# Patient Record
Sex: Male | Born: 1950
Health system: Southern US, Community
[De-identification: ages and names within clinical notes are randomized; demographics above are authoritative.]

## PROBLEM LIST (undated history)

## (undated) DIAGNOSIS — M179 Osteoarthritis of knee, unspecified: Secondary | ICD-10-CM

## (undated) DIAGNOSIS — E669 Obesity, unspecified: Secondary | ICD-10-CM

## (undated) DIAGNOSIS — E039 Hypothyroidism, unspecified: Secondary | ICD-10-CM

## (undated) DIAGNOSIS — E291 Testicular hypofunction: Secondary | ICD-10-CM

## (undated) DIAGNOSIS — Z9289 Personal history of other medical treatment: Secondary | ICD-10-CM

## (undated) DIAGNOSIS — H409 Unspecified glaucoma: Secondary | ICD-10-CM

## (undated) DIAGNOSIS — E119 Type 2 diabetes mellitus without complications: Secondary | ICD-10-CM

## (undated) DIAGNOSIS — M1990 Unspecified osteoarthritis, unspecified site: Secondary | ICD-10-CM

## (undated) DIAGNOSIS — F519 Sleep disorder not due to a substance or known physiological condition, unspecified: Secondary | ICD-10-CM

## (undated) DIAGNOSIS — M2550 Pain in unspecified joint: Secondary | ICD-10-CM

## (undated) DIAGNOSIS — J309 Allergic rhinitis, unspecified: Secondary | ICD-10-CM

## (undated) DIAGNOSIS — E559 Vitamin D deficiency, unspecified: Secondary | ICD-10-CM

## (undated) DIAGNOSIS — G4733 Obstructive sleep apnea (adult) (pediatric): Secondary | ICD-10-CM

## (undated) DIAGNOSIS — M1710 Unilateral primary osteoarthritis, unspecified knee: Secondary | ICD-10-CM

## (undated) DIAGNOSIS — M25561 Pain in right knee: Secondary | ICD-10-CM

## (undated) DIAGNOSIS — E785 Hyperlipidemia, unspecified: Secondary | ICD-10-CM

## (undated) DIAGNOSIS — M25562 Pain in left knee: Secondary | ICD-10-CM

## (undated) DIAGNOSIS — K219 Gastro-esophageal reflux disease without esophagitis: Secondary | ICD-10-CM

## (undated) DIAGNOSIS — G8929 Other chronic pain: Secondary | ICD-10-CM

## (undated) HISTORY — DX: Type 2 diabetes mellitus without complications: E11.9

## (undated) HISTORY — PX: UVULOPALATOPHARYNGOPLASTY: SHX827

## (undated) HISTORY — DX: Hyperlipidemia, unspecified: E78.5

## (undated) HISTORY — DX: Obesity, unspecified: E66.9

## (undated) HISTORY — DX: Hypothyroidism, unspecified: E03.9

## (undated) HISTORY — DX: Unspecified osteoarthritis, unspecified site: M19.90

## (undated) HISTORY — DX: Personal history of other medical treatment: Z92.89

## (undated) HISTORY — DX: Gastro-esophageal reflux disease without esophagitis: K21.9

## (undated) HISTORY — DX: Vitamin D deficiency, unspecified: E55.9

## (undated) HISTORY — DX: Sleep disorder not due to a substance or known physiological condition, unspecified: F51.9

## (undated) HISTORY — DX: Testicular hypofunction: E29.1

## (undated) HISTORY — DX: Other chronic pain: G89.29

## (undated) HISTORY — PX: TONSILLECTOMY: SUR1361

## (undated) HISTORY — DX: Unspecified glaucoma: H40.9

## (undated) HISTORY — DX: Pain in right knee: M25.561

## (undated) HISTORY — DX: Obstructive sleep apnea (adult) (pediatric): G47.33

## (undated) HISTORY — PX: VASECTOMY: SHX75

## (undated) HISTORY — DX: Allergic rhinitis, unspecified: J30.9

## (undated) HISTORY — PX: NASAL SEPTUM SURGERY: SHX37

## (undated) HISTORY — DX: Pain in left knee: M25.562

## (undated) HISTORY — DX: Pain in unspecified joint: M25.50

---

## 2000-04-05 ENCOUNTER — Ambulatory Visit (HOSPITAL_COMMUNITY): Admission: RE | Admit: 2000-04-05 | Discharge: 2000-04-05 | Payer: Self-pay | Admitting: Orthopedic Surgery

## 2000-04-05 ENCOUNTER — Encounter: Payer: Self-pay | Admitting: Orthopedic Surgery

## 2002-04-29 ENCOUNTER — Ambulatory Visit (HOSPITAL_BASED_OUTPATIENT_CLINIC_OR_DEPARTMENT_OTHER): Admission: RE | Admit: 2002-04-29 | Discharge: 2002-04-29 | Payer: Self-pay | Admitting: General Practice

## 2002-07-13 ENCOUNTER — Encounter: Payer: Self-pay | Admitting: Otolaryngology

## 2002-07-13 ENCOUNTER — Encounter (INDEPENDENT_AMBULATORY_CARE_PROVIDER_SITE_OTHER): Payer: Self-pay | Admitting: Specialist

## 2002-07-13 ENCOUNTER — Ambulatory Visit (HOSPITAL_COMMUNITY): Admission: RE | Admit: 2002-07-13 | Discharge: 2002-07-14 | Payer: Self-pay | Admitting: Otolaryngology

## 2005-03-27 ENCOUNTER — Ambulatory Visit: Payer: Self-pay | Admitting: Internal Medicine

## 2005-07-19 ENCOUNTER — Ambulatory Visit: Payer: Self-pay | Admitting: Internal Medicine

## 2005-08-08 ENCOUNTER — Ambulatory Visit: Payer: Self-pay | Admitting: Internal Medicine

## 2005-09-22 ENCOUNTER — Ambulatory Visit: Payer: Self-pay | Admitting: Family Medicine

## 2006-05-16 ENCOUNTER — Ambulatory Visit: Payer: Self-pay | Admitting: Internal Medicine

## 2006-05-27 ENCOUNTER — Ambulatory Visit: Payer: Self-pay

## 2006-06-05 ENCOUNTER — Ambulatory Visit: Payer: Self-pay | Admitting: Internal Medicine

## 2006-06-14 ENCOUNTER — Ambulatory Visit: Payer: Self-pay | Admitting: Internal Medicine

## 2006-06-18 ENCOUNTER — Ambulatory Visit: Payer: Self-pay | Admitting: Gastroenterology

## 2006-06-19 ENCOUNTER — Ambulatory Visit: Payer: Self-pay | Admitting: Gastroenterology

## 2006-06-19 ENCOUNTER — Encounter (INDEPENDENT_AMBULATORY_CARE_PROVIDER_SITE_OTHER): Payer: Self-pay | Admitting: *Deleted

## 2006-08-01 ENCOUNTER — Ambulatory Visit: Payer: Self-pay | Admitting: Internal Medicine

## 2006-09-27 ENCOUNTER — Ambulatory Visit: Payer: Self-pay | Admitting: Internal Medicine

## 2006-10-24 ENCOUNTER — Ambulatory Visit: Payer: Self-pay | Admitting: Internal Medicine

## 2007-04-01 ENCOUNTER — Ambulatory Visit: Payer: Self-pay | Admitting: Internal Medicine

## 2007-04-06 ENCOUNTER — Encounter: Payer: Self-pay | Admitting: Internal Medicine

## 2007-04-06 DIAGNOSIS — M1990 Unspecified osteoarthritis, unspecified site: Secondary | ICD-10-CM | POA: Insufficient documentation

## 2007-04-06 DIAGNOSIS — E782 Mixed hyperlipidemia: Secondary | ICD-10-CM

## 2007-04-06 DIAGNOSIS — F528 Other sexual dysfunction not due to a substance or known physiological condition: Secondary | ICD-10-CM

## 2007-04-06 DIAGNOSIS — J309 Allergic rhinitis, unspecified: Secondary | ICD-10-CM

## 2007-04-06 DIAGNOSIS — G4733 Obstructive sleep apnea (adult) (pediatric): Secondary | ICD-10-CM

## 2007-04-06 DIAGNOSIS — J342 Deviated nasal septum: Secondary | ICD-10-CM

## 2007-04-06 DIAGNOSIS — E291 Testicular hypofunction: Secondary | ICD-10-CM

## 2007-07-11 ENCOUNTER — Ambulatory Visit: Payer: Self-pay | Admitting: Internal Medicine

## 2007-07-11 DIAGNOSIS — F519 Sleep disorder not due to a substance or known physiological condition, unspecified: Secondary | ICD-10-CM | POA: Insufficient documentation

## 2007-07-11 DIAGNOSIS — K219 Gastro-esophageal reflux disease without esophagitis: Secondary | ICD-10-CM

## 2007-07-11 DIAGNOSIS — J019 Acute sinusitis, unspecified: Secondary | ICD-10-CM

## 2007-07-11 DIAGNOSIS — E739 Lactose intolerance, unspecified: Secondary | ICD-10-CM

## 2008-07-13 ENCOUNTER — Ambulatory Visit: Payer: Self-pay | Admitting: Internal Medicine

## 2010-08-15 NOTE — Assessment & Plan Note (Signed)
Summary: SINUS /NWS $50   Vital Signs:  Patient Profile:   60 Years Old Male Weight:      284 pounds O2 Sat:      94 % O2 treatment:    Room Air Temp:     98.3 degrees F oral Pulse rate:   96 / minute BP sitting:   122 / 84  (left arm) Cuff size:   regular  Vitals Entered By: Payton Spark CMA (July 13, 2008 4:21 PM)                 Chief Complaint:  sinus problems x 5 days.  History of Present Illness: no insurance currently- here with acute onset sinus pain, pressure, fever and greenish d/c for 3 days just not getting better; some mild ST, but no significant cough, sob, wheezing or doe    Updated Prior Medication List: CEPHALEXIN 500 MG CAPS (CEPHALEXIN) 1 by mouth three times a day  Current Allergies (reviewed today): No known allergies   Past Medical History:    Reviewed history from 07/11/2007 and no changes required:       Obstructive Sleep Apnea       Allergic rhinitis       Hx of Deviated Nasal Septum       Hyperlipidemia       Osteoarthritis       Erectile Dysfunction       Hypogonadism       DJD       GERD       glucose intolerance  Past Surgical History:    Reviewed history from 04/06/2007 and no changes required:       Tonsillectomy       Deviated Nasal Septum Repair       Vasectomy   Social History:    Reviewed history from 07/11/2007 and no changes required:       Never Smoked       Alcohol use-no       Married    Review of Systems       all otherwise negative    Physical Exam  General:     alert and overweight-appearing.  , mild ill  Head:     Normocephalic and atraumatic without obvious abnormalities. No apparent alopecia or balding. Eyes:     No corneal or conjunctival inflammation noted. EOMI. Perrla.  Ears:     bilat tm's red, sinus tender bilat Nose:     nasal dischargemucosal pallor and mucosal erythema.   Mouth:     pharyngeal erythema and fair dentition.   Neck:     supple and cervical lymphadenopathy.     Lungs:     Normal respiratory effort, chest expands symmetrically. Lungs are clear to auscultation, no crackles or wheezes. Heart:     Normal rate and regular rhythm. S1 and S2 normal without gallop, murmur, click, rub or other extra sounds. Extremities:     no edema, no ulcers     Impression & Recommendations:  Problem # 1:  SINUSITIS- ACUTE-NOS (ICD-461.9)  The following medications were removed from the medication list:    Avelox 400 Mg Tabs (Moxifloxacin hcl) .Marland Kitchen... 1 by mouth qd    Promethazine-codeine 6.25-10 Mg/30ml Syrp (Promethazine-codeine) .Marland Kitchen... 1 tsp by mouth qid prn  His updated medication list for this problem includes:    Cephalexin 500 Mg Caps (Cephalexin) .Marland Kitchen... 1 by mouth three times a day treat as above, f/u any worsening signs or symptoms   Problem #  2:  GLUCOSE INTOLERANCE (ICD-271.3) asympt- to call for onset polydipsia, polyuria  Problem # 3:  ALLERGIC RHINITIS (ICD-477.9) stable overall by hx and exam, ok to continue meds/tx as is - for otc meds  Complete Medication List: 1)  Cephalexin 500 Mg Caps (Cephalexin) .Marland Kitchen.. 1 by mouth three times a day   Patient Instructions: 1)  Please take all new medications as prescribed 2)  Continue all medications that you may have been taking previously 3)  Please schedule a follow-up appointment as needed.   Prescriptions: CEPHALEXIN 500 MG CAPS (CEPHALEXIN) 1 by mouth three times a day  #30 x 0   Entered and Authorized by:   Corwin Levins MD   Signed by:   Corwin Levins MD on 07/13/2008   Method used:   Print then Give to Patient   RxID:   [id number]  ]

## 2010-10-25 ENCOUNTER — Telehealth: Payer: Self-pay | Admitting: Internal Medicine

## 2010-10-25 NOTE — Telephone Encounter (Signed)
Ok with me 

## 2010-10-25 NOTE — Telephone Encounter (Signed)
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 Pt needs with in 4 wks. Please advise.

## 2010-10-26 ENCOUNTER — Encounter: Payer: Self-pay | Admitting: Internal Medicine

## 2010-10-26 DIAGNOSIS — E119 Type 2 diabetes mellitus without complications: Secondary | ICD-10-CM | POA: Insufficient documentation

## 2010-10-28 ENCOUNTER — Encounter: Payer: Self-pay | Admitting: Internal Medicine

## 2010-10-28 DIAGNOSIS — Z0000 Encounter for general adult medical examination without abnormal findings: Secondary | ICD-10-CM | POA: Insufficient documentation

## 2010-10-31 ENCOUNTER — Encounter: Payer: Self-pay | Admitting: Internal Medicine

## 2010-10-31 ENCOUNTER — Other Ambulatory Visit (INDEPENDENT_AMBULATORY_CARE_PROVIDER_SITE_OTHER): Payer: Self-pay | Admitting: Internal Medicine

## 2010-10-31 ENCOUNTER — Ambulatory Visit (INDEPENDENT_AMBULATORY_CARE_PROVIDER_SITE_OTHER): Payer: Self-pay | Admitting: Internal Medicine

## 2010-10-31 ENCOUNTER — Other Ambulatory Visit (INDEPENDENT_AMBULATORY_CARE_PROVIDER_SITE_OTHER): Payer: Self-pay

## 2010-10-31 VITALS — BP 110/80 | HR 85 | Temp 98.1°F | Ht 75.0 in | Wt 280.0 lb

## 2010-10-31 DIAGNOSIS — R7302 Impaired glucose tolerance (oral): Secondary | ICD-10-CM

## 2010-10-31 DIAGNOSIS — M25511 Pain in right shoulder: Secondary | ICD-10-CM | POA: Insufficient documentation

## 2010-10-31 DIAGNOSIS — Z0000 Encounter for general adult medical examination without abnormal findings: Secondary | ICD-10-CM

## 2010-10-31 DIAGNOSIS — R7309 Other abnormal glucose: Secondary | ICD-10-CM

## 2010-10-31 DIAGNOSIS — E785 Hyperlipidemia, unspecified: Secondary | ICD-10-CM

## 2010-10-31 DIAGNOSIS — F528 Other sexual dysfunction not due to a substance or known physiological condition: Secondary | ICD-10-CM

## 2010-10-31 DIAGNOSIS — M25519 Pain in unspecified shoulder: Secondary | ICD-10-CM

## 2010-10-31 LAB — URINALYSIS, ROUTINE W REFLEX MICROSCOPIC
Bilirubin Urine: NEGATIVE
Leukocytes, UA: NEGATIVE
Nitrite: NEGATIVE
Specific Gravity, Urine: 1.02 (ref 1.000–1.030)
Total Protein, Urine: NEGATIVE
pH: 6 (ref 5.0–8.0)

## 2010-10-31 LAB — BASIC METABOLIC PANEL
BUN: 11 mg/dL (ref 6–23)
Calcium: 9.8 mg/dL (ref 8.4–10.5)
Creatinine, Ser: 1 mg/dL (ref 0.4–1.5)
GFR: 80.54 mL/min (ref >60.00)

## 2010-10-31 LAB — HEPATIC FUNCTION PANEL
Albumin: 4 g/dL (ref 3.5–5.2)
Total Bilirubin: 0.6 mg/dL (ref 0.3–1.2)

## 2010-10-31 LAB — LIPID PANEL
Cholesterol: 232 mg/dL — ABNORMAL HIGH (ref 0–200)
HDL: 50 mg/dL (ref >39.00)
Triglycerides: 208 mg/dL — ABNORMAL HIGH (ref 0.0–149.0)
VLDL: 41.6 mg/dL — ABNORMAL HIGH (ref 0.0–40.0)

## 2010-10-31 LAB — CBC WITH DIFFERENTIAL/PLATELET
Basophils Relative: 0.8 % (ref 0.0–3.0)
Eosinophils Relative: 2.7 % (ref 0.0–5.0)
HCT: 44.5 % (ref 39.0–52.0)
Lymphs Abs: 2.8 10*3/uL (ref 0.7–4.0)
MCV: 91.2 fl (ref 78.0–100.0)
Monocytes Absolute: 0.8 10*3/uL (ref 0.1–1.0)
Monocytes Relative: 9.1 % (ref 3.0–12.0)
RBC: 4.88 Mil/uL (ref 4.22–5.81)
WBC: 8.5 10*3/uL (ref 4.5–10.5)

## 2010-10-31 LAB — LDL CHOLESTEROL, DIRECT: Direct LDL: 153.6 mg/dL

## 2010-10-31 MED ORDER — TETANUS-DIPHTH-ACELL PERTUSSIS 5-2.5-18.5 LF-MCG/0.5 IM SUSP
0.5000 mL | Freq: Once | INTRAMUSCULAR | Status: AC
  Administered 2010-10-31: 0.5 mL via INTRAMUSCULAR

## 2010-10-31 MED ORDER — TADALAFIL 20 MG PO TABS: 20.0000 mg | ORAL_TABLET | Freq: Every day | ORAL | Status: DC | PRN

## 2010-10-31 NOTE — Progress Notes (Signed)
Subjective:    Patient ID: Connor Becker, male    DOB: 07-30-1950, 60 y.o.   MRN: [id number]  HPI  Here for wellness and f/u;  Overall doing ok;  Pt denies CP, worsening SOB, DOE, wheezing, orthopnea, PND, worsening LE edema, palpitations, dizziness or syncope.  Pt denies neurological change such as new Headache, facial or extremity weakness.  Pt denies polydipsia, polyuria, or low sugar symptoms. Pt states overall good compliance with treatment and medications, good tolerability, and trying to follow lower cholesterol diet.  Pt denies worsening depressive symptoms, suicidal ideation or panic. No fever, wt loss, night sweats, loss of appetite, or other constitutional symptoms.  Pt states good ability with ADL's, low fall risk, home safety reviewed and adequate, no significant changes in hearing or vision, and occasionally active with exercise.  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  Also need Cialis refill.  Also with intermittent pain to the right shoulder for 2- 3 yrs, worse with hammering a roof (as he has done lately) and strenuous exercise. Did not have chance to have labs done.   Past Medical History  Diagnosis Date  . Impaired glucose tolerance 10/26/2010  . HYPOGONADISM, MALE 04/06/2007  . HYPERLIPIDEMIA 04/06/2007  . ERECTILE DYSFUNCTION 04/06/2007  . INSOMNIA-SLEEP DISORDER-UNSPEC 07/11/2007  . SLEEP APNEA, OBSTRUCTIVE 04/06/2007  . ALLERGIC RHINITIS 04/06/2007  . GERD 07/11/2007  . OSTEOARTHRITIS 04/06/2007   Past Surgical History  Procedure Date  . Tonsillectomy   . Nasal septum surgery   . Vasectomy     reports that he has never smoked. He does not have any smokeless tobacco history on file. He reports that he does not drink alcohol or use illicit drugs. family history includes Diabetes in an unspecified family member; Heart disease in an unspecified family member; Hypertension in an unspecified family member; and Prostate  cancer in an unspecified family member. No Known Allergies No current outpatient prescriptions on file prior to visit.   Review of Systems Review of Systems  Constitutional: Negative for diaphoresis, activity change, appetite change and unexpected weight change.  HENT: Negative for hearing loss, ear pain, facial swelling, mouth sores and neck stiffness.   Eyes: Negative for pain, redness and visual disturbance.  Respiratory: Negative for shortness of breath and wheezing.   Cardiovascular: Negative for chest pain and palpitations.  Gastrointestinal: Negative for diarrhea, blood in stool, abdominal distention and rectal pain.  Genitourinary: Negative for hematuria, flank pain and decreased urine volume.  Musculoskeletal: Negative for myalgias and joint swelling.  Skin: Negative for color change and wound.  Neurological: Negative for syncope and numbness.  Hematological: Negative for adenopathy.  Psychiatric/Behavioral: Negative for hallucinations, self-injury, decreased concentration and agitation.      Objective:   Physical Exam BP 110/80  Pulse 85  Temp(Src) 98.1 F (36.7 C) (Oral)  Ht 6\' 3"  (1.905 m)  Wt 280 lb (127.007 kg)  BMI 35.00 kg/m2  SpO2 96% Physical Exam  VS noted Constitutional: Pt is oriented to person, place, and time. Appears well-developed and well-nourished.  HENT:  Head: Normocephalic and atraumatic.  Right Ear: External ear normal.  Left Ear: External ear normal.  Nose: Nose normal.  Mouth/Throat: Oropharynx is clear and moist.  Eyes: Conjunctivae and EOM are normal. Pupils are equal, round, and reactive to light.  Neck: Normal range of motion. Neck supple. No JVD present. No tracheal deviation present.  Cardiovascular: Normal rate, regular rhythm, normal heart sounds  and intact distal pulses.   Pulmonary/Chest: Effort normal and breath sounds normal.  Abdominal: Soft. Bowel sounds are normal. There is no tenderness.  Musculoskeletal: Normal range of  motion. Exhibits no edema.  Lymphadenopathy:  Has no cervical adenopathy.  Neurological: Pt is alert and oriented to person, place, and time. Pt has normal reflexes. No cranial nerve deficit.  Skin: Skin is warm and dry. No rash noted.  Psychiatric:  Has  normal mood and affect. Behavior is normal. 1+nervous        Assessment & Plan:

## 2010-10-31 NOTE — Patient Instructions (Signed)
You had the tetanus shot today Your forms will be filled out asap Please go to LAB in the Basement for the blood and/or urine tests to be done today Please call the number on the Blue Card (the [employer] System) for results of testing in 2-3 days You will be contacted regarding the referral for: Dr Rhetta Mura Continue all other medications as before Please return in 1 year for your yearly visit, or sooner if needed, with Lab testing done 3-5 days before

## 2010-10-31 NOTE — Assessment & Plan Note (Signed)
Exam c/w prob bursitis - for referral to Dr Rhetta Mura per pt request

## 2010-10-31 NOTE — Assessment & Plan Note (Signed)

## 2010-10-31 NOTE — Assessment & Plan Note (Signed)
assympt - to check a1c today, cont wt loss,diet efforts

## 2010-10-31 NOTE — Progress Notes (Signed)
Quick Note:  Voice message left on [employer] system - lab is negative, normal or otherwise stable, pt to continue same tx ______ 

## 2010-11-01 ENCOUNTER — Encounter: Payer: Self-pay | Admitting: Internal Medicine

## 2010-11-01 ENCOUNTER — Other Ambulatory Visit: Payer: Self-pay

## 2010-11-01 MED ORDER — ATORVASTATIN CALCIUM 10 MG PO TABS: 10.0000 mg | ORAL_TABLET | Freq: Every day | ORAL | Status: DC

## 2010-11-01 NOTE — Assessment & Plan Note (Signed)
For cialis for prn use

## 2010-11-29 ENCOUNTER — Other Ambulatory Visit: Payer: Self-pay

## 2010-11-29 ENCOUNTER — Other Ambulatory Visit: Payer: Self-pay | Admitting: Internal Medicine

## 2010-11-29 DIAGNOSIS — Z79899 Other long term (current) drug therapy: Secondary | ICD-10-CM
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
## 2010-12-01 NOTE — Op Note (Signed)
NAME:  Connor Becker, Connor Becker NO.:  [id number]   MEDICAL RECORD NO.:  [id number]                   PATIENT TYPE:  OIB   LOCATION:  2899                                 FACILITY:  [facility]   PHYSICIAN:  Hermelinda Medicus, M.D.                DATE OF BIRTH:  1950-10-20   DATE OF PROCEDURE:  07/13/2002  DATE OF DISCHARGE:                                 OPERATIVE REPORT   PREOPERATIVE DIAGNOSES:  1. Septal deviation.  2. Turbinate hypertrophy with nasal obstruction.  3. Sleep apnea.  4. Tonsillar hypertrophy.   POSTOPERATIVE DIAGNOSES:  1. Septal deviation.  2. Turbinate hypertrophy with nasal obstruction.  3. Sleep apnea.  4. Tonsillar hypertrophy.   PROCEDURES:  1. Septal reconstruction.  2. Turbinate reduction.  3. Tonsillectomy.  4. Uvulopalatoplasty.   SURGEON:  Hermelinda Medicus, M.D.   ANESTHESIA:  General endotracheal,  Judie Petit, M.D.   DESCRIPTION OF PROCEDURE:  The patient was placed in the supine position and  under general endotracheal anesthesia, the nose was anesthetized with  topical cocaine 200 mg and 1% Xylocaine with epinephrine.  The turbinates  were large and obstructive and were lateralized or pushed laterally on  reducing the inferior turbinates in an effort to make more space.  A  hemitransfixion incision was then made on the right side of the columella  region and carried around the columella to the left side and back along the  quadrilateral cartilage, where the posterior quadrilateral cartilage strip  was taken along with the strip from the floor of the nose, and then the  ethmoid and vomerine septal deviation was also approached using the open and  closed Jansen-Middletons.  The premaxillary crest where the cartilage was  spurred over to the right was approached taking a strip of cartilage that  was off the premaxillary crest and then taking the 4 mm chisel, and we used  this to take down this spur.  Once this was  removed, then the septum was  established back on the midline and the posterior aspect of the septum was  also in straight orientation and the nasal airway was markedly improved.  Closure was begun using 5-0 plain catgut and a through-and-through septal  suture using 4-0 plain x2.  The intranasal dressing was placed and attention  was carried to the oral cavity and the tonsillar region, where the tonsillar  gag was placed.  The tonsils were removed using Bovie electrocoagulation,  and hemostasis was established with the Bovie electrocoagulation.  The  palate and uvula were also trimmed using the Metzenbaum scissors as well as  the Bovie electrocoagulation for hemostasis.  Once this was completed, the  anterior and posterior uvula membrane was brought into approximation, the  tonsillar beds were again checked, the stomach was suctioned, the  nasopharynx packing was removed, and 7.5 and a 7 anesthesia trumpet  were placed within the nose, and then again  the nasopharynx was checked as  well as the oropharynx, found to be completely dry, and the patient was  awakened, tolerated the procedure well, and is doing well postoperatively.  His follow-up will be overnight with pulse oximeter and then in one week,  three weeks, six weeks, six months.                                                 Hermelinda Medicus, M.D.    JC/MEDQ  D:  07/13/2002  T:  07/13/2002  Job:  [id number]   cc:   Theadora Rama, M.D.   [address] [facility], [facility]

## 2010-12-01 NOTE — Assessment & Plan Note (Signed)
[facility]                         GASTROENTEROLOGY OFFICE NOTE   Connor, Becker                      MRN:          [id number]  DATE:06/18/2006                            DOB:          09-18-1950    REASON FOR REFERRAL:  Abnormal liver function tests and colorectal  cancer screening.   HISTORY OF PRESENT ILLNESS:  [name] is a 60 year old white male  referred through the courtesy of Dr. Oliver Barre.  He has a history of  GERD with intermittent symptoms.  He uses Nexium and Tums on demand for  control of his symptoms.  His symptoms are bothersome about once or  twice a week.  His symptoms have worsened over the past 2 years as he  has gained weight.  He relates no prior history of liver disease,  jaundice, or known exposures to persons with hepatitis.  He denies  needle sticks, tattoos, intravenous drug usage, and a family history of  liver disease.  Liver function tests from September 2006 showed an  elevated ALT at 50; the remainder of the liver function tests were  normal.  Blood work from May 27, 2006, showed a total bilirubin of  1.3, AST of 488, ALT of 830, and an alkaline phosphatase which was  normal at 100.  He had a mildly elevated eosinophil count at 5.4% and a  mildly elevated monocyte count at 11.3%.  The remainder of the CBC was  unremarkable.  His glucose was mildly elevated at 120 and his TSH was  also mildly elevated at 6.38.  Repeat liver function tests from June 05, 2006, show an AST of 124 and an ALT of 214.  The remainder of liver  function tests were normal.  Abdominal ultrasound was performed on  June 14, 2006, and read by Dr. Lina Sar as showing a slight  thickening of the gallbladder wall with no other abnormalities noted.  He notes no abdominal pain, nausea, vomiting, dyspeptic symptoms, change  in bowel habits, melena, hematochezia, or change in stool caliber.  An  acute hepatitis panel  performed on November 21 was negative for acute  hepatitis A, B and C.  He states he has felt tired recently and he has  started treatment with Avelox for an upper respiratory infection within  the past few days.   PAST MEDICAL HISTORY:  1. Status post tonsillectomy.  2. Status post vasectomy.  3. Osteoarthritis.  4. Hypogonadism.  5. Allergies.  6. GERD.   CURRENT MEDICATIONS:  Listed on the chart, updated and reviewed.   MEDICATION ALLERGIES:  None known.   SOCIAL HISTORY AND REVIEW OF SYSTEMS:  As per the handwritten evaluation  form.   PHYSICAL EXAMINATION:  GENERAL:  Overweight white male in no acute  distress.  VITAL SIGNS:  Height 6 feet 4 inches, weight 286.6. pounds.  Blood  pressure is 94/70, pulse 72 and regular.  HEENT:  Anicteric sclerae, oropharynx clear.  CHEST:  Clear to auscultation bilaterally.  CARDIAC:  Regular rate and rhythm without murmurs appreciated.  ABDOMEN:  Soft, nontender, nondistended, normal active bowel sounds.  No  palpable organomegaly, masses or hernias.  RECTAL:  Examination deferred to time of colonoscopy.  EXTREMITIES:  Without clubbing, cyanosis, or edema.  NEUROLOGIC:  Alert and oriented x3.  Grossly nonfocal.   ASSESSMENT AND PLAN:  1. Abnormal transaminases.  He did have a minimal elevation of his      total bilirubin as well.  No symptomatic or ultrasound evidence for      biliary disease.  Acute viral hepatitis panel was negative.  Rule      out Epstein-Barr virus, autoimmune liver disease, a toxic exposure      and other liver diseases.  Obtain all standard liver serologies as      well as a prothrombin time and a monospot.  2. Average risk for colorectal cancer.  He is interested in proceeding      with colonoscopy at this time.  Risks, benefits and alternatives to      colonoscopy with possible biopsy and possible polypectomy discussed      with the patient and he consents to proceed.  This will be      scheduled  electively.     Venita Lick. Russella Dar, MD, [facility]  Electronically Signed    MTS/MedQ  DD: 06/18/2006  DT: 06/18/2006  Job #: [id number]   cc:   Corwin Levins, MD

## 2011-06-20 ENCOUNTER — Ambulatory Visit (INDEPENDENT_AMBULATORY_CARE_PROVIDER_SITE_OTHER): Payer: Self-pay | Admitting: Internal Medicine

## 2011-06-20 ENCOUNTER — Encounter: Payer: Self-pay | Admitting: Internal Medicine

## 2011-06-20 DIAGNOSIS — J019 Acute sinusitis, unspecified: Secondary | ICD-10-CM | POA: Insufficient documentation

## 2011-06-20 DIAGNOSIS — Z0000 Encounter for general adult medical examination without abnormal findings: Secondary | ICD-10-CM

## 2011-06-20 DIAGNOSIS — R7302 Impaired glucose tolerance (oral): Secondary | ICD-10-CM

## 2011-06-20 DIAGNOSIS — R7309 Other abnormal glucose: Secondary | ICD-10-CM

## 2011-06-20 DIAGNOSIS — E785 Hyperlipidemia, unspecified: Secondary | ICD-10-CM

## 2011-06-20 MED ORDER — ATORVASTATIN CALCIUM 10 MG PO TABS: 10.0000 mg | ORAL_TABLET | Freq: Every day | ORAL | Status: DC

## 2011-06-20 MED ORDER — LEVOFLOXACIN 250 MG PO TABS: 250.0000 mg | ORAL_TABLET | Freq: Every day | ORAL | Status: AC

## 2011-06-20 MED ORDER — HYDROCODONE-HOMATROPINE 5-1.5 MG/5ML PO SYRP: 5.0000 mL | ORAL_SOLUTION | Freq: Four times a day (QID) | ORAL | Status: AC | PRN

## 2011-06-20 NOTE — Assessment & Plan Note (Signed)
Now willing to start lipitor, has new insurance - for lipitor 10 qd , f/u labs next visit

## 2011-06-20 NOTE — Patient Instructions (Signed)
Take all new medications as prescribed, including the antibiotic, cough medicine and lipitor 10 mg Continue all other medications as before Please return in 6 mo with Lab testing done 3-5 days before

## 2011-06-24 ENCOUNTER — Encounter: Payer: Self-pay | Admitting: Internal Medicine

## 2011-06-24 NOTE — Assessment & Plan Note (Signed)
Mild to mod, for antibx course,  to f/u any worsening symptoms or concerns 

## 2011-06-24 NOTE — Assessment & Plan Note (Signed)
stable overall by hx and exam, most recent data reviewed with pt, and pt to continue medical treatment as before  Lab Results  Component Value Date   HGBA1C 6.1 10/31/2010

## 2011-06-24 NOTE — Progress Notes (Signed)
  Subjective:    Patient ID: Connor Becker, male    DOB: Apr 22, 1951, 60 y.o.   MRN: [id number]  HPI   Here with 3 days acute onset fever, facial pain, pressure, general weakness and malaise, and greenish d/c, with slight ST, but little to no cough and Pt denies chest pain, increased sob or doe, wheezing, orthopnea, PND, increased LE swelling, palpitations, dizziness or syncope.  Here to f/u; overall doing ok,  Pt denies chest pain, increased sob or doe, wheezing, orthopnea, PND, increased LE swelling, palpitations, dizziness or syncope.  Pt denies new neurological symptoms such as new headache, or facial or extremity weakness or numbness   Pt denies polydipsia, polyuria.  Pt states overall good compliance with meds, trying to follow lower cholesterol  diet, wt overall stable but little exercise however.  Not taking the liptor curretnly. Past Medical History  Diagnosis Date  . Impaired glucose tolerance 10/26/2010  . HYPOGONADISM, MALE 04/06/2007  . HYPERLIPIDEMIA 04/06/2007  . ERECTILE DYSFUNCTION 04/06/2007  . INSOMNIA-SLEEP DISORDER-UNSPEC 07/11/2007  . SLEEP APNEA, OBSTRUCTIVE 04/06/2007  . ALLERGIC RHINITIS 04/06/2007  . GERD 07/11/2007  . OSTEOARTHRITIS 04/06/2007   Past Surgical History  Procedure Date  . Tonsillectomy   . Nasal septum surgery   . Vasectomy     reports that he has never smoked. He does not have any smokeless tobacco history on file. He reports that he does not drink alcohol or use illicit drugs. family history includes Diabetes in an unspecified family member; Heart disease in an unspecified family member; Hypertension in an unspecified family member; and Prostate cancer in an unspecified family member. No Known Allergies No current outpatient prescriptions on file prior to visit.   Review of Systems Review of Systems  Constitutional: Negative for diaphoresis and unexpected weight change.  HENT: Negative for drooling and tinnitus.   Eyes: Negative for photophobia and  visual disturbance.  Respiratory: Negative for choking and stridor.   Gastrointestinal: Negative for vomiting and blood in stool.  Genitourinary: Negative for hematuria and decreased urine volume.      Objective:   Physical Exam BP 100/70  Pulse 78  Temp(Src) 98.3 F (36.8 C) (Oral)  Ht 6\' 3"  (1.905 m)  Wt 296 lb 4 oz (134.378 kg)  BMI 37.03 kg/m2  SpO2 97% Physical Exam  VS noted, mild ill Constitutional: Pt appears well-developed and well-nourished.  HENT: Head: Normocephalic.  Right Ear: External ear normal.  Left Ear: External ear normal.  Bilat tm's mild erythema.  Sinus tender bilat.  Pharynx mild erythema Eyes: Conjunctivae and EOM are normal. Pupils are equal, round, and reactive to light.  Neck: Normal range of motion. Neck supple.  Cardiovascular: Normal rate and regular rhythm.   Pulmonary/Chest: Effort normal and breath sounds normal.  Skin: Skin is warm. No erythema.  Psychiatric: Pt behavior is normal. Thought content normal.     Assessment & Plan:

## 2011-07-27 ENCOUNTER — Encounter: Payer: Self-pay | Admitting: Gastroenterology

## 2011-07-30 ENCOUNTER — Encounter: Payer: Self-pay | Admitting: Internal Medicine

## 2011-07-30 ENCOUNTER — Ambulatory Visit (INDEPENDENT_AMBULATORY_CARE_PROVIDER_SITE_OTHER)
Admission: RE | Admit: 2011-07-30 | Discharge: 2011-07-30 | Disposition: A | Discharge status: Home / Self Care | Payer: Managed Care, Other (non HMO) | Source: Ambulatory Visit | Attending: Internal Medicine | Admitting: Internal Medicine

## 2011-07-30 ENCOUNTER — Ambulatory Visit (INDEPENDENT_AMBULATORY_CARE_PROVIDER_SITE_OTHER): Payer: Managed Care, Other (non HMO) | Admitting: Internal Medicine

## 2011-07-30 VITALS — BP 110/72 | HR 100 | Temp 98.8°F | Ht 75.0 in | Wt 298.4 lb

## 2011-07-30 DIAGNOSIS — R7302 Impaired glucose tolerance (oral): Secondary | ICD-10-CM

## 2011-07-30 DIAGNOSIS — J209 Acute bronchitis, unspecified: Secondary | ICD-10-CM | POA: Insufficient documentation

## 2011-07-30 DIAGNOSIS — R7309 Other abnormal glucose: Secondary | ICD-10-CM

## 2011-07-30 DIAGNOSIS — J309 Allergic rhinitis, unspecified: Secondary | ICD-10-CM

## 2011-07-30 MED ORDER — LEVOFLOXACIN 250 MG PO TABS: 250.0000 mg | ORAL_TABLET | Freq: Every day | ORAL | Status: AC

## 2011-07-30 MED ORDER — HYDROCODONE-HOMATROPINE 5-1.5 MG/5ML PO SYRP: 5.0000 mL | ORAL_SOLUTION | Freq: Four times a day (QID) | ORAL | Status: DC | PRN

## 2011-07-30 NOTE — Progress Notes (Signed)
  Subjective:    Patient ID: Connor Becker, male    DOB: Dec 02, 1950, 61 y.o.   MRN: [id number]  HPI  Here with acute onset mild to mod 2-3 days ST, HA, general weakness and malaise, with prod cough greenish sputum, but Pt denies chest pain, increased sob or doe, wheezing, orthopnea, PND, increased LE swelling, palpitations, dizziness or syncope.  Pt denies new neurological symptoms such as new headache, or facial or extremity weakness or numbness   Pt denies polydipsia, polyuria.  Does have several wks ongoing nasal allergy symptoms with clear congestion, itch and sneeze, without fever, pain, ST, cough or wheezing. Past Medical History  Diagnosis Date  . Impaired glucose tolerance 10/26/2010  . HYPOGONADISM, MALE 04/06/2007  . HYPERLIPIDEMIA 04/06/2007  . ERECTILE DYSFUNCTION 04/06/2007  . INSOMNIA-SLEEP DISORDER-UNSPEC 07/11/2007  . SLEEP APNEA, OBSTRUCTIVE 04/06/2007  . ALLERGIC RHINITIS 04/06/2007  . GERD 07/11/2007  . OSTEOARTHRITIS 04/06/2007   Past Surgical History  Procedure Date  . Tonsillectomy   . Nasal septum surgery   . Vasectomy     reports that he has never smoked. He does not have any smokeless tobacco history on file. He reports that he does not drink alcohol or use illicit drugs. family history includes Diabetes in an unspecified family member; Heart disease in an unspecified family member; Hypertension in an unspecified family member; and Prostate cancer in an unspecified family member. No Known Allergies Current Outpatient Prescriptions on File Prior to Visit  Medication Sig Dispense Refill  . atorvastatin (LIPITOR) 10 MG tablet Take 1 tablet (10 mg total) by mouth daily.  90 tablet  3   Review of Systems Review of Systems  Constitutional: Negative for diaphoresis and unexpected weight change.  HENT: Negative for drooling and tinnitus.   Eyes: Negative for photophobia and visual disturbance.  Respiratory: Negative for choking and stridor.   Gastrointestinal: Negative  for vomiting and blood in stool.  Genitourinary: Negative for hematuria and decreased urine volume.    Objective:   Physical Exam BP 110/72  Pulse 100  Temp(Src) 98.8 F (37.1 C) (Oral)  Ht 6\' 3"  (1.905 m)  Wt 298 lb 6 oz (135.342 kg)  BMI 37.29 kg/m2  SpO2 94% Physical Exam  VS noted, mild ill Constitutional: Pt appears well-developed and well-nourished.  HENT: Head: Normocephalic.  Right Ear: External ear normal.  Left Ear: External ear normal.  Bilat tm's mild erythema.  Sinus nontender.  Pharynx mild erythema Eyes: Conjunctivae and EOM are normal. Pupils are equal, round, and reactive to light.  Neck: Normal range of motion. Neck supple.  Cardiovascular: Normal rate and regular rhythm.   Pulmonary/Chest: Effort normal and breath sounds normal.  Neurological: Pt is alert. No cranial nerve deficit.  Skin: Skin is warm. No erythema.  Psychiatric: Pt behavior is normal. Thought content normal.     Assessment & Plan:

## 2011-07-30 NOTE — Patient Instructions (Addendum)
Take all new medications as prescribed Continue all other medications as before Please go to XRAY in the Basement for the x-ray test Please call the phone number [phone] (the [employer] System) for results of testing in 2-3 days;  When calling, simply dial the number, and when prompted enter the MRN number above (the Medical Record Number) and the # key, then the message should start. Please return in June 2013 with Lab testing done 3-5 days before, or sooner if needed

## 2011-07-30 NOTE — Assessment & Plan Note (Signed)
Mild to mod, for antibx course,  to f/u any worsening symptoms or concerns 

## 2011-07-30 NOTE — Assessment & Plan Note (Signed)
Mild to mod, for allegra otc prn,  to f/u any worsening symptoms or concerns 

## 2011-07-30 NOTE — Assessment & Plan Note (Signed)
stable overall by hx and exam, most recent data reviewed with pt, and pt to continue medical treatment as before  Lab Results  Component Value Date   WBC 8.5 10/31/2010   HGB 15.5 10/31/2010   HCT 44.5 10/31/2010   PLT 241.0 10/31/2010   GLUCOSE 77 10/31/2010   CHOL 232* 10/31/2010   TRIG 208.0* 10/31/2010   HDL 50.00 10/31/2010   LDLDIRECT 153.6 10/31/2010   ALT 33 10/31/2010   AST 26 10/31/2010   NA 140 10/31/2010   K 4.7 10/31/2010   CL 102 10/31/2010   CREATININE 1.0 10/31/2010   BUN 11 10/31/2010   CO2 31 10/31/2010   TSH 5.95* 10/31/2010   PSA 1.36 10/31/2010   HGBA1C 6.1 10/31/2010

## 2011-12-04 ENCOUNTER — Encounter: Payer: Self-pay | Admitting: Endocrinology

## 2011-12-04 ENCOUNTER — Ambulatory Visit (INDEPENDENT_AMBULATORY_CARE_PROVIDER_SITE_OTHER): Payer: Managed Care, Other (non HMO) | Admitting: Endocrinology

## 2011-12-04 VITALS — BP 108/70 | HR 84 | Temp 96.2°F | Ht 75.0 in | Wt 302.0 lb

## 2011-12-04 DIAGNOSIS — J069 Acute upper respiratory infection, unspecified: Secondary | ICD-10-CM

## 2011-12-04 MED ORDER — CEFUROXIME AXETIL 250 MG PO TABS: 250.0000 mg | ORAL_TABLET | Freq: Two times a day (BID) | ORAL | Status: AC

## 2011-12-04 NOTE — Progress Notes (Signed)
  Subjective:    Patient ID: Connor Becker, male    DOB: [birth year], 61 y.o.   MRN: [id number]  HPI Pt states few days of slight pain at the throat, and assoc nasal congestion.   Past Medical History  Diagnosis Date  . Impaired glucose tolerance 10/26/2010  . HYPOGONADISM, MALE 04/06/2007  . HYPERLIPIDEMIA 04/06/2007  . ERECTILE DYSFUNCTION 04/06/2007  . INSOMNIA-SLEEP DISORDER-UNSPEC 07/11/2007  . SLEEP APNEA, OBSTRUCTIVE 04/06/2007  . ALLERGIC RHINITIS 04/06/2007  . GERD 07/11/2007  . OSTEOARTHRITIS 04/06/2007    Past Surgical History  Procedure Date  . Tonsillectomy   . Nasal septum surgery   . Vasectomy     History   Social History  . Marital Status: Married    Spouse Name: N/A    Number of Children: N/A  . Years of Education: N/A   Occupational History  . Not on file.   Social History Main Topics  . Smoking status: Never Smoker   . Smokeless tobacco: Not on file  . Alcohol Use: No  . Drug Use: No  . Sexually Active: Not on file   Other Topics Concern  . Not on file   Social History Narrative  . No narrative on file    Current Outpatient Prescriptions on File Prior to Visit  Medication Sig Dispense Refill  . atorvastatin (LIPITOR) 10 MG tablet Take 1 tablet (10 mg total) by mouth daily.  90 tablet  3    No Known Allergies  Family History  Problem Relation Age of Onset  . Diabetes    . Hypertension    . Prostate cancer    . Heart disease      BP 108/70  Pulse 84  Temp(Src) 96.2 F (35.7 C) (Oral)  Ht 6\' 3"  (1.905 m)  Wt 302 lb (136.986 kg)  BMI 37.75 kg/m2  SpO2 96%  Review of Systems He has bilat maxillary and frontal pain.  Fever is resolved.      Objective:   Physical Exam VITAL SIGNS:  See vs page GENERAL: no distress head: no deformity eyes: no periorbital swelling, no proptosis external nose and ears are normal mouth: no lesion seen Both tm's are red (R>L)     Assessment & Plan:  URI.  new

## 2011-12-04 NOTE — Patient Instructions (Addendum)
i have sent a prescription to your pharmacy, for an antibiotic pill.   I hope you feel better soon.  If you don't feel better by next week, please call back.  Please call sooner if you get worse. Loratadine-d (non-prescription) will help your congestion.

## 2012-04-15 ENCOUNTER — Encounter: Payer: Self-pay | Admitting: Gastroenterology

## 2012-06-20 ENCOUNTER — Ambulatory Visit (INDEPENDENT_AMBULATORY_CARE_PROVIDER_SITE_OTHER): Payer: Managed Care, Other (non HMO) | Admitting: Internal Medicine

## 2012-06-20 ENCOUNTER — Encounter: Payer: Self-pay | Admitting: Internal Medicine

## 2012-06-20 VITALS — BP 120/82 | HR 87 | Temp 98.2°F | Ht 75.0 in | Wt 304.4 lb

## 2012-06-20 DIAGNOSIS — J069 Acute upper respiratory infection, unspecified: Secondary | ICD-10-CM

## 2012-06-20 DIAGNOSIS — J019 Acute sinusitis, unspecified: Secondary | ICD-10-CM

## 2012-06-20 DIAGNOSIS — G4733 Obstructive sleep apnea (adult) (pediatric): Secondary | ICD-10-CM

## 2012-06-20 MED ORDER — AZITHROMYCIN 250 MG PO TABS: ORAL_TABLET | ORAL | Status: DC

## 2012-06-20 MED ORDER — HYDROCODONE-HOMATROPINE 5-1.5 MG/5ML PO SYRP: 5.0000 mL | ORAL_SOLUTION | Freq: Four times a day (QID) | ORAL | Status: AC | PRN

## 2012-06-20 NOTE — Assessment & Plan Note (Signed)
S/p uvuloplasty and nasal surery 2003 Not tol of CPAP trials in past Continues to snore, worse with sinus infection symptoms

## 2012-06-20 NOTE — Progress Notes (Signed)
  Subjective:    HPI  complains of head cold symptoms  Onset >1 week ago, progressively symptoms  associated with rhinorrhea, sneezing, sore throat, mild headache and low grade fever Also myalgias, sinus pressure and mild-mod chest congestion No relief with OTC meds Precipitated by sick contacts  Past Medical History  Diagnosis Date  . Impaired glucose tolerance   . HYPOGONADISM, MALE   . HYPERLIPIDEMIA   . INSOMNIA-SLEEP DISORDER-UNSPEC   . SLEEP APNEA, OBSTRUCTIVE   . ALLERGIC RHINITIS   . GERD   . OSTEOARTHRITIS     Review of Systems Constitutional: No fever, no unexpected weight change Pulmonary: No pleurisy or hemoptysis Cardiovascular: No chest pain or palpitations     Objective:   Physical Exam BP 120/82  Pulse 87  Temp 98.2 F (36.8 C) (Oral)  Ht 6\' 3"  (1.905 m)  Wt 304 lb 6.4 oz (138.075 kg)  BMI 38.05 kg/m2  SpO2 97% GEN: mildly ill appearing and audible head congestion HENT: NCAT, mild sinus tenderness bilaterally, nares with clear discharge, oropharynx mild erythema, no exudate Eyes: Vision grossly intact, no conjunctivitis Lungs: Clear to auscultation without rhonchi or wheeze, no increased work of breathing Cardiovascular: Regular rate and rhythm, no bilateral edema      Assessment & Plan:  Viral URI >sinusitus   Explained lack of efficacy for antibiotics in viral disease, but empiric antibiotics prescribed due to symptom duration greater than 7 days Prescription cough suppression - new prescriptions done Symptomatic care with Tylenol or Advil, hydration and rest -  salt gargle advised as needed

## 2012-06-20 NOTE — Patient Instructions (Signed)
It was good to see you today.  Zpak antibiotics and prescription cough syrup - Your prescription(s) have been submitted to your pharmacy. Please take as directed and contact our office if you believe you are having problem(s) with the medication(s).  Alternate between ibuprofen and tylenol for aches, pain and fever symptoms as discussed  Hydrate, rest and call if worse or unimproved 

## 2012-09-18 ENCOUNTER — Ambulatory Visit (INDEPENDENT_AMBULATORY_CARE_PROVIDER_SITE_OTHER): Payer: Managed Care, Other (non HMO) | Admitting: Internal Medicine

## 2012-09-18 ENCOUNTER — Encounter: Payer: Self-pay | Admitting: Internal Medicine

## 2012-09-18 ENCOUNTER — Other Ambulatory Visit (INDEPENDENT_AMBULATORY_CARE_PROVIDER_SITE_OTHER): Payer: Managed Care, Other (non HMO)

## 2012-09-18 VITALS — BP 110/70 | HR 104 | Temp 97.8°F | Ht 75.0 in | Wt 304.4 lb

## 2012-09-18 DIAGNOSIS — F528 Other sexual dysfunction not due to a substance or known physiological condition: Secondary | ICD-10-CM

## 2012-09-18 DIAGNOSIS — Z0000 Encounter for general adult medical examination without abnormal findings: Secondary | ICD-10-CM

## 2012-09-18 DIAGNOSIS — J019 Acute sinusitis, unspecified: Secondary | ICD-10-CM

## 2012-09-18 DIAGNOSIS — R7302 Impaired glucose tolerance (oral): Secondary | ICD-10-CM

## 2012-09-18 DIAGNOSIS — R7309 Other abnormal glucose: Secondary | ICD-10-CM

## 2012-09-18 DIAGNOSIS — E291 Testicular hypofunction: Secondary | ICD-10-CM

## 2012-09-18 LAB — URINALYSIS, ROUTINE W REFLEX MICROSCOPIC
Leukocytes, UA: NEGATIVE
Nitrite: NEGATIVE
Specific Gravity, Urine: 1.02 (ref 1.000–1.030)
pH: 6 (ref 5.0–8.0)

## 2012-09-18 LAB — CBC WITH DIFFERENTIAL/PLATELET
Eosinophils Relative: 2.3 % (ref 0.0–5.0)
Monocytes Relative: 10.2 % (ref 3.0–12.0)
Neutrophils Relative %: 74.7 % (ref 43.0–77.0)
Platelets: 225 10*3/uL (ref 150.0–400.0)
WBC: 9.8 10*3/uL (ref 4.5–10.5)

## 2012-09-18 LAB — HEPATIC FUNCTION PANEL
ALT: 36 U/L (ref 0–53)
AST: 28 U/L (ref 0–37)
Albumin: 3.9 g/dL (ref 3.5–5.2)

## 2012-09-18 LAB — BASIC METABOLIC PANEL
BUN: 15 mg/dL (ref 6–23)
Chloride: 96 mEq/L (ref 96–112)
Creatinine, Ser: 1.1 mg/dL (ref 0.4–1.5)
GFR: 71.71 mL/min (ref >60.00)
Glucose, Bld: 138 mg/dL — ABNORMAL HIGH (ref 70–99)
Potassium: 4.6 mEq/L (ref 3.5–5.1)

## 2012-09-18 LAB — LIPID PANEL
Cholesterol: 173 mg/dL (ref 0–200)
Triglycerides: 128 mg/dL (ref 0.0–149.0)
VLDL: 25.6 mg/dL (ref 0.0–40.0)

## 2012-09-18 LAB — TSH: TSH: 7.62 u[IU]/mL — ABNORMAL HIGH (ref 0.35–5.50)

## 2012-09-18 MED ORDER — LEVOFLOXACIN 250 MG PO TABS: 250.0000 mg | ORAL_TABLET | Freq: Every day | ORAL | Status: DC

## 2012-09-18 MED ORDER — TADALAFIL 20 MG PO TABS: 20.0000 mg | ORAL_TABLET | Freq: Every day | ORAL | Status: DC | PRN

## 2012-09-18 NOTE — Assessment & Plan Note (Addendum)

## 2012-09-18 NOTE — Patient Instructions (Addendum)
Please take all new medication as prescribed - the antibiotic Please continue all other medications as before, and refills have been done if requested - the cialis Please go to the LAB in the Basement (turn left off the elevator) for the tests to be done today You will be contacted by phone if any changes need to be made immediately.  Otherwise, you will receive a letter about your results with an explanation, but please check with MyChart first. Thank you for enrolling in MyChart. Please follow the instructions below to securely access your online medical record. MyChart allows you to send messages to your doctor, view your test results, renew your prescriptions, schedule appointments, and more. To Log into My Chart online, please go by [employer] or [employer] to [email or web address], or download the MyChart App from the [employer] of [employer] .  Your Username is: [email or web address] (pass [email or web address]) Please send a practice Message on Mychart later today. Please call or message if you change your mind about the colonoscopy Please return in 1 year for your yearly visit, or sooner if needed, with Lab testing done 3-5 days before

## 2012-09-18 NOTE — Assessment & Plan Note (Signed)
Mild to mod, for antibx course,  to f/u any worsening symptoms or concerns 

## 2012-09-18 NOTE — Assessment & Plan Note (Signed)
Also for total testosterone, thanks

## 2012-09-18 NOTE — Assessment & Plan Note (Signed)
Asympt, for a1c today 

## 2012-09-18 NOTE — Progress Notes (Signed)
Subjective:    Patient ID: Connor Becker, male    DOB: 1950-11-03, 62 y.o.   MRN: [id number]  HPI  Here for wellness and f/u;  Overall doing ok;  Pt denies CP, worsening SOB, DOE, wheezing, orthopnea, PND, worsening LE edema, palpitations, dizziness or syncope.  Pt denies neurological change such as new headache, facial or extremity weakness.  Pt denies polydipsia, polyuria, or low sugar symptoms. Pt states overall good compliance with treatment and medications, good tolerability, and has been trying to follow lower cholesterol diet.  Pt denies worsening depressive symptoms, suicidal ideation or panic. No fever, night sweats, wt loss, loss of appetite, or other constitutional symptoms.  Pt states good ability with ADL's, has low fall risk, home safety reviewed and adequate, no other significant changes in hearing or vision, and only occasionally active with exercise. 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  Incidentally with 2-3 days acute onset fever, facial pain, pressure, headache, general weakness and malaise, and greenish d/c, with mild ST and non prod cough.  Asks for cialis due to worsening ED symtpoms over the past yr Past Medical History  Diagnosis Date  . Impaired glucose tolerance   . HYPOGONADISM, MALE   . HYPERLIPIDEMIA   . INSOMNIA-SLEEP DISORDER-UNSPEC   . SLEEP APNEA, OBSTRUCTIVE   . ALLERGIC RHINITIS   . GERD   . OSTEOARTHRITIS    Past Surgical History  Procedure Laterality Date  . Tonsillectomy    . Nasal septum surgery    . Vasectomy      reports that he has never smoked. He does not have any smokeless tobacco history on file. He reports that he does not drink alcohol or use illicit drugs. family history includes Diabetes in an unspecified family member; Heart disease in an unspecified family member; Hypertension in an unspecified family member; and Prostate cancer in an unspecified family member. No Known Allergies Current Outpatient Prescriptions on File Prior to  Visit  Medication Sig Dispense Refill  . guaiFENesin (MUCINEX) 600 MG 12 hr tablet Take 600 mg by mouth 2 (two) times daily.      . pseudoephedrine (SUDAFED) 30 MG tablet Take 30 mg by mouth every 4 (four) hours as needed.       No current facility-administered medications on file prior to visit.   Review of Systems Constitutional: Negative for diaphoresis, activity change, appetite change or unexpected weight change.  HENT: Negative for hearing loss, ear pain, facial swelling, mouth sores and neck stiffness.   Eyes: Negative for pain, redness and visual disturbance.  Respiratory: Negative for shortness of breath and wheezing.   Cardiovascular: Negative for chest pain and palpitations.  Gastrointestinal: Negative for diarrhea, blood in stool, abdominal distention or other pain Genitourinary: Negative for hematuria, flank pain or change in urine volume.  Musculoskeletal: Negative for myalgias and joint swelling.  Skin: Negative for color change and wound.  Neurological: Negative for syncope and numbness. other than noted Hematological: Negative for adenopathy.  Psychiatric/Behavioral: Negative for hallucinations, self-injury, decreased concentration and agitation.      Objective:   Physical Exam BP 110/70  Pulse 104  Temp(Src) 97.8 F (36.6 C) (Oral)  Ht 6\' 3"  (1.905 m)  Wt 304 lb 6 oz (138.064 kg)  BMI 38.04 kg/m2  SpO2 95% VS noted, mild ill Constitutional: Pt is oriented to person, place, and time. Appears well-developed and well-nourished.  Head: Normocephalic and atraumatic.  Right Ear: External ear normal.  Left Ear: External ear normal.  Nose: Nose normal.  Bilat tm's with mild erythema.  Max sinus areas mild tender.  Pharynx with mild erythema, no exudate Eyes: Conjunctivae and EOM are normal. Pupils are equal, round, and reactive to light.  Neck: Normal range of motion. Neck supple. No JVD present. No tracheal deviation present.  Cardiovascular: Normal rate, regular  rhythm, normal heart sounds and intact distal pulses.   Pulmonary/Chest: Effort normal and breath sounds normal.  Abdominal: Soft. Bowel sounds are normal. There is no tenderness. No HSM  Musculoskeletal: Normal range of motion. Exhibits no edema.  Lymphadenopathy:  Has no cervical adenopathy.  Neurological: Pt is alert and oriented to person, place, and time. Pt has normal reflexes. No cranial nerve deficit.  Skin: Skin is warm and dry. No rash noted.  Psychiatric:  Has  normal mood and affect. Behavior is normal.     Assessment & Plan:

## 2012-09-18 NOTE — Assessment & Plan Note (Signed)
Ok for cialis 20 mg prn,  to f/u any worsening symptoms or concerns  

## 2012-09-21 ENCOUNTER — Other Ambulatory Visit: Payer: Self-pay | Admitting: Internal Medicine

## 2012-09-21 DIAGNOSIS — E291 Testicular hypofunction: Secondary | ICD-10-CM

## 2012-09-21 MED ORDER — LEVOTHYROXINE SODIUM 25 MCG PO TABS: 25.0000 ug | ORAL_TABLET | Freq: Every day | ORAL | Status: DC

## 2012-09-23 ENCOUNTER — Telehealth: Payer: Self-pay

## 2012-09-23 MED ORDER — TADALAFIL 5 MG PO TABS: 5.0000 mg | ORAL_TABLET | Freq: Every day | ORAL | Status: DC | PRN

## 2012-09-23 MED ORDER — TESTOSTERONE 50 MG/5GM (1%) TD GEL: 5.0000 g | Freq: Every day | TRANSDERMAL | Status: DC

## 2012-09-23 NOTE — Telephone Encounter (Signed)
cialis 5 mg daily done erx

## 2012-09-23 NOTE — Telephone Encounter (Signed)
Called the patients wife and informed of MD's response on questions.  Faxed hardcopy to [facility].  The patient does want a cialis prescription sent to Connor Becker please.

## 2012-09-23 NOTE — Telephone Encounter (Signed)
1.Ok to try androgel asd - Done hardcopy to robin  2. The fever can be off an on for the next wk , ok to watch for any sustained persistent fever, worsening pain, cough or greenish nasal d/c or blood  3.  vericose are a mild physical abnormality that does not have an effect on psa or testosterone and normally requires no tx, despite the size  4. cialis 5 mg per day is approved for BPH, and may help this and ED symptom, but can also be expensive.  I can prescribe this if the pt wants this, but would still need the androgel

## 2012-09-23 NOTE — Telephone Encounter (Signed)
The patients wife called in reference to the patients lab results and instructions. 1. Please do call in testosterone replace (#90 day supply to [facility]) 2.  The patient's cough is improving, but now has a fever off and on? 3. Varicose vein inside scrotum the size of a walnut, is this related to PSA/testosterone? 4. Cialis once a day prescribed for prostate issue, would it help? Please advise on above questions.

## 2013-04-01 ENCOUNTER — Other Ambulatory Visit: Payer: Self-pay | Admitting: Internal Medicine

## 2013-04-01 NOTE — Telephone Encounter (Signed)
Done hardcopy to robin  

## 2013-04-02 NOTE — Telephone Encounter (Signed)
Faxed hardcopy to [facility]

## 2013-05-21 ENCOUNTER — Other Ambulatory Visit: Payer: Self-pay

## 2013-12-04 ENCOUNTER — Ambulatory Visit (INDEPENDENT_AMBULATORY_CARE_PROVIDER_SITE_OTHER): Payer: BC Managed Care – PPO | Admitting: Internal Medicine

## 2013-12-04 ENCOUNTER — Other Ambulatory Visit (INDEPENDENT_AMBULATORY_CARE_PROVIDER_SITE_OTHER): Payer: BC Managed Care – PPO

## 2013-12-04 ENCOUNTER — Other Ambulatory Visit: Payer: Self-pay | Admitting: Internal Medicine

## 2013-12-04 ENCOUNTER — Encounter: Payer: Self-pay | Admitting: Internal Medicine

## 2013-12-04 VITALS — BP 112/72 | HR 76 | Temp 98.2°F | Ht 75.0 in | Wt 302.0 lb

## 2013-12-04 DIAGNOSIS — Z0000 Encounter for general adult medical examination without abnormal findings: Secondary | ICD-10-CM

## 2013-12-04 DIAGNOSIS — E039 Hypothyroidism, unspecified: Secondary | ICD-10-CM

## 2013-12-04 DIAGNOSIS — IMO0001 Reserved for inherently not codable concepts without codable children: Secondary | ICD-10-CM

## 2013-12-04 DIAGNOSIS — E1165 Type 2 diabetes mellitus with hyperglycemia: Principal | ICD-10-CM

## 2013-12-04 DIAGNOSIS — E291 Testicular hypofunction: Secondary | ICD-10-CM

## 2013-12-04 DIAGNOSIS — J309 Allergic rhinitis, unspecified: Secondary | ICD-10-CM

## 2013-12-04 HISTORY — DX: Hypothyroidism, unspecified: E03.9

## 2013-12-04 LAB — URINALYSIS, ROUTINE W REFLEX MICROSCOPIC
BILIRUBIN URINE: NEGATIVE
Hgb urine dipstick: NEGATIVE
Ketones, ur: NEGATIVE
LEUKOCYTES UA: NEGATIVE
NITRITE: NEGATIVE
PH: 6 (ref 5.0–8.0)
RBC / HPF: NONE SEEN (ref 0–?)
TOTAL PROTEIN, URINE-UPE24: NEGATIVE
Urine Glucose: NEGATIVE
Urobilinogen, UA: 0.2 (ref 0.0–1.0)

## 2013-12-04 LAB — LIPID PANEL
CHOLESTEROL: 204 mg/dL — AB (ref 0–200)
HDL: 43.9 mg/dL (ref >39.00)
LDL CALC: 131 mg/dL — AB (ref 0–99)
TRIGLYCERIDES: 147 mg/dL (ref 0.0–149.0)
Total CHOL/HDL Ratio: 5
VLDL: 29.4 mg/dL (ref 0.0–40.0)

## 2013-12-04 LAB — CBC WITH DIFFERENTIAL/PLATELET
Basophils Absolute: 0 10*3/uL (ref 0.0–0.1)
Basophils Relative: 0.6 % (ref 0.0–3.0)
EOS PCT: 3.5 % (ref 0.0–5.0)
Eosinophils Absolute: 0.2 10*3/uL (ref 0.0–0.7)
HEMATOCRIT: 42.8 % (ref 39.0–52.0)
HEMOGLOBIN: 14.6 g/dL (ref 13.0–17.0)
LYMPHS ABS: 2 10*3/uL (ref 0.7–4.0)
LYMPHS PCT: 29.8 % (ref 12.0–46.0)
MCHC: 34 g/dL (ref 30.0–36.0)
MCV: 92.1 fl (ref 78.0–100.0)
Monocytes Absolute: 0.6 10*3/uL (ref 0.1–1.0)
Monocytes Relative: 8.9 % (ref 3.0–12.0)
Neutro Abs: 3.8 10*3/uL (ref 1.4–7.7)
Neutrophils Relative %: 57.2 % (ref 43.0–77.0)
Platelets: 243 10*3/uL (ref 150.0–400.0)
RBC: 4.65 Mil/uL (ref 4.22–5.81)
RDW: 12.9 % (ref 11.5–15.5)
WBC: 6.7 10*3/uL (ref 4.0–10.5)

## 2013-12-04 LAB — TSH: TSH: 7.32 u[IU]/mL — ABNORMAL HIGH (ref 0.35–4.50)

## 2013-12-04 LAB — BASIC METABOLIC PANEL
BUN: 14 mg/dL (ref 6–23)
CALCIUM: 8.9 mg/dL (ref 8.4–10.5)
CO2: 28 mEq/L (ref 19–32)
Chloride: 104 mEq/L (ref 96–112)
Creatinine, Ser: 1.1 mg/dL (ref 0.4–1.5)
GFR: 74.56 mL/min (ref >60.00)
Glucose, Bld: 138 mg/dL — ABNORMAL HIGH (ref 70–99)
POTASSIUM: 3.9 meq/L (ref 3.5–5.1)
SODIUM: 138 meq/L (ref 135–145)

## 2013-12-04 LAB — MICROALBUMIN / CREATININE URINE RATIO
Creatinine,U: 864.5 mg/dL
MICROALB/CREAT RATIO: 0.2 mg/g (ref 0.0–30.0)
Microalb, Ur: 2 mg/dL — ABNORMAL HIGH (ref 0.0–1.9)

## 2013-12-04 LAB — HEPATIC FUNCTION PANEL
ALBUMIN: 3.8 g/dL (ref 3.5–5.2)
ALK PHOS: 59 U/L (ref 39–117)
ALT: 27 U/L (ref 0–53)
AST: 24 U/L (ref 0–37)
Bilirubin, Direct: 0.1 mg/dL (ref 0.0–0.3)
TOTAL PROTEIN: 7.2 g/dL (ref 6.0–8.3)
Total Bilirubin: 0.6 mg/dL (ref 0.2–1.2)

## 2013-12-04 LAB — HEMOGLOBIN A1C: Hgb A1c MFr Bld: 6.9 % — ABNORMAL HIGH (ref 4.6–6.5)

## 2013-12-04 LAB — TESTOSTERONE: Testosterone: 118.12 ng/dL — ABNORMAL LOW (ref 300.00–890.00)

## 2013-12-04 LAB — PSA: PSA: 2.01 ng/mL (ref 0.10–4.00)

## 2013-12-04 LAB — T4, FREE: Free T4: 0.76 ng/dL (ref 0.60–1.60)

## 2013-12-04 MED ORDER — LEVOTHYROXINE SODIUM 25 MCG PO TABS: 25.0000 ug | ORAL_TABLET | Freq: Every day | ORAL | Status: DC

## 2013-12-04 MED ORDER — FLUTICASONE PROPIONATE 50 MCG/ACT NA SUSP: 1.0000 | Freq: Every day | NASAL | Status: DC

## 2013-12-04 MED ORDER — ASPIRIN EC 81 MG PO TBEC: 81.0000 mg | DELAYED_RELEASE_TABLET | Freq: Every day | ORAL | Status: DC

## 2013-12-04 MED ORDER — ATORVASTATIN CALCIUM 10 MG PO TABS: 10.0000 mg | ORAL_TABLET | Freq: Every day | ORAL | Status: DC

## 2013-12-04 NOTE — Progress Notes (Signed)
Pre visit review using our clinic review tool, if applicable. No additional management support is needed unless otherwise documented below in the visit note. 

## 2013-12-04 NOTE — Assessment & Plan Note (Signed)

## 2013-12-04 NOTE — Patient Instructions (Addendum)
Please take all new medication as prescribed - the flonase as directed  Please start ASA 81 mg - 1 per day  Please continue all other medications as before, and refills have been done if requested, including the thyroid medication  Please have the pharmacy call with any other refills you may need.  Please go to the LAB in the Basement (turn left off the elevator) for the tests to be done today  You will be contacted by phone if any changes need to be made immediately.  Otherwise, you will receive a letter about your results with an explanation, but please check with MyChart first.  Please remember to sign up for MyChart if you have not done so, as this will be important to you in the future with finding out test results, communicating by private email, and scheduling acute appointments online when needed.  You will be contacted regarding the referral for: colonoscopy  Please return in 6 months, or sooner if needed, with Lab testing done 3-5 days before

## 2013-12-04 NOTE — Progress Notes (Signed)
Subjective:    Patient ID: Connor Becker, male    DOB: September 09, 1950, 63 y.o.   MRN: [id number]  HPI  Here for wellness and f/u;  Overall doing ok;  Pt denies CP, worsening SOB, DOE, wheezing, orthopnea, PND, worsening LE edema, palpitations, dizziness or syncope.  Pt denies neurological change such as new headache, facial or extremity weakness.  Pt denies polydipsia, polyuria, or low sugar symptoms. Pt states overall good compliance with treatment and medications, good tolerability, and has been trying to follow lower cholesterol diet.  Pt denies worsening depressive symptoms, suicidal ideation or panic. No fever, night sweats, wt loss, loss of appetite, or other constitutional symptoms.  Pt states good ability with ADL's, has low fall risk, home safety reviewed and adequate, no other significant changes in hearing or vision, and only occasionally active with exercise.  Denies hyper or hypo thyroid symptoms such as voice, skin or hair change, not taking thyroid med recently.  Wife asks for check testosterone level.. Does have several wks ongoing nasal allergy symptoms with clearish congestion, itch and sneezing, without fever, pain, ST, cough, swelling or wheezing.   Past Medical History  Diagnosis Date  . Impaired glucose tolerance   . HYPOGONADISM, MALE   . HYPERLIPIDEMIA   . INSOMNIA-SLEEP DISORDER-UNSPEC   . SLEEP APNEA, OBSTRUCTIVE   . ALLERGIC RHINITIS   . GERD   . OSTEOARTHRITIS   . Unspecified hypothyroidism 12/04/2013   Past Surgical History  Procedure Laterality Date  . Tonsillectomy    . Nasal septum surgery    . Vasectomy      reports that he has never smoked. He does not have any smokeless tobacco history on file. He reports that he does not drink alcohol or use illicit drugs. family history includes Diabetes in an other family member; Heart disease in an other family member; Hypertension in an other family member; Prostate cancer in an other family member. No Known  Allergies Current Outpatient Prescriptions on File Prior to Visit  Medication Sig Dispense Refill  . guaiFENesin (MUCINEX) 600 MG 12 hr tablet Take 600 mg by mouth 2 (two) times daily.       No current facility-administered medications on file prior to visit.   Review of Systems Constitutional: Negative for increased diaphoresis, other activity, appetite or other siginficant weight change  HENT: Negative for worsening hearing loss, ear pain, facial swelling, mouth sores and neck stiffness.   Eyes: Negative for other worsening pain, redness or visual disturbance.  Respiratory: Negative for shortness of breath and wheezing.   Cardiovascular: Negative for chest pain and palpitations.  Gastrointestinal: Negative for diarrhea, blood in stool, abdominal distention or other pain Genitourinary: Negative for hematuria, flank pain or change in urine volume.  Musculoskeletal: Negative for myalgias or other joint complaints.  Skin: Negative for color change and wound.  Neurological: Negative for syncope and numbness. other than noted Hematological: Negative for adenopathy. or other swelling Psychiatric/Behavioral: Negative for hallucinations, self-injury, decreased concentration or other worsening agitation.      Objective:   Physical Exam  BP 112/72  Pulse 76  Temp(Src) 98.2 F (36.8 C) (Oral)  Ht 6\' 3"  (1.905 m)  Wt 302 lb (136.986 kg)  BMI 37.75 kg/m2  SpO2 97% VS noted,  Constitutional: Pt is oriented to person, place, and time. Appears well-developed and well-nourished.  Head: Normocephalic and atraumatic.  Right Ear: External ear normal.  Left Ear: External ear normal.  Nose: Nose normal.  Mouth/Throat: Oropharynx is clear and moist.  Eyes: Conjunctivae and EOM are normal. Pupils are equal, round, and reactive to light.  Neck: Normal range of motion. Neck supple. No JVD present. No tracheal deviation present.  Cardiovascular: Normal rate, regular rhythm, normal heart sounds and  intact distal pulses.   Pulmonary/Chest: Effort normal and breath sounds without rales or wheezing  Abdominal: Soft. Bowel sounds are normal. NT. No HSM  Musculoskeletal: Normal range of motion. Exhibits no edema.  Lymphadenopathy:  Has no cervical adenopathy.  Neurological: Pt is alert and oriented to person, place, and time. Pt has normal reflexes. No cranial nerve deficit. Motor grossly intact Skin: Skin is warm and dry. No rash noted.  Psychiatric:  Has normal mood and affect. Behavior is normal.     Assessment & Plan:

## 2013-12-07 NOTE — Assessment & Plan Note (Signed)
To re-start thryoid med, encouraged compliacne

## 2013-12-07 NOTE — Assessment & Plan Note (Signed)
stable overall by history and exam, recent data reviewed with pt, and pt to continue medical treatment as before,  to f/u any worsening symptoms or concerns Lab Results  Component Value Date   HGBA1C 6.9* 12/04/2013    

## 2013-12-07 NOTE — Assessment & Plan Note (Signed)
For testosterone check, consider androgel, but cautios with any prostate symptoms worsening

## 2013-12-07 NOTE — Assessment & Plan Note (Signed)
For flonase asd,  to f/u any worsening symptoms or concerns  

## 2014-01-11 ENCOUNTER — Encounter: Payer: Self-pay | Admitting: Internal Medicine

## 2014-04-27 ENCOUNTER — Ambulatory Visit (INDEPENDENT_AMBULATORY_CARE_PROVIDER_SITE_OTHER): Payer: BC Managed Care – PPO | Admitting: Internal Medicine

## 2014-04-27 ENCOUNTER — Encounter: Payer: Self-pay | Admitting: Internal Medicine

## 2014-04-27 VITALS — BP 112/64 | HR 92 | Temp 98.2°F | Ht 76.0 in | Wt 292.4 lb

## 2014-04-27 DIAGNOSIS — J018 Other acute sinusitis: Secondary | ICD-10-CM

## 2014-04-27 DIAGNOSIS — E119 Type 2 diabetes mellitus without complications: Secondary | ICD-10-CM

## 2014-04-27 DIAGNOSIS — K219 Gastro-esophageal reflux disease without esophagitis: Secondary | ICD-10-CM

## 2014-04-27 DIAGNOSIS — J019 Acute sinusitis, unspecified: Secondary | ICD-10-CM | POA: Insufficient documentation

## 2014-04-27 MED ORDER — LEVOFLOXACIN 250 MG PO TABS: 250.0000 mg | ORAL_TABLET | Freq: Every day | ORAL | Status: DC

## 2014-04-27 MED ORDER — PANTOPRAZOLE SODIUM 40 MG PO TBEC: 40.0000 mg | DELAYED_RELEASE_TABLET | Freq: Every day | ORAL | Status: DC

## 2014-04-27 MED ORDER — ATORVASTATIN CALCIUM 10 MG PO TABS: 10.0000 mg | ORAL_TABLET | Freq: Every day | ORAL | Status: DC

## 2014-04-27 MED ORDER — LEVOTHYROXINE SODIUM 25 MCG PO TABS: 25.0000 ug | ORAL_TABLET | Freq: Every day | ORAL | Status: DC

## 2014-04-27 NOTE — Progress Notes (Signed)
   Subjective:    Patient ID: Connor Becker, male    DOB: 05-06-1951, 63 y.o.   MRN: [id number]  HPI   Here with 2-3 days acute onset fever, facial pain, pressure, headache, general weakness and malaise, and greenish d/c, with mild ST and cough, but pt denies chest pain, wheezing, increased sob or doe, orthopnea, PND, increased LE swelling, palpitations, dizziness or syncope.  Pt denies new neurological symptoms such as new headache, or facial or extremity weakness or numbness   Pt denies polydipsia, polyuria.  Has had mild worsening reflux for several wks, without other abd pain, dysphagia, n/v, bowel change or blood. Past Medical History  Diagnosis Date  . Impaired glucose tolerance   . HYPOGONADISM, MALE   . HYPERLIPIDEMIA   . INSOMNIA-SLEEP DISORDER-UNSPEC   . SLEEP APNEA, OBSTRUCTIVE   . ALLERGIC RHINITIS   . GERD   . OSTEOARTHRITIS   . Unspecified hypothyroidism 12/04/2013   Past Surgical History  Procedure Laterality Date  . Tonsillectomy    . Nasal septum surgery    . Vasectomy      reports that he has never smoked. He does not have any smokeless tobacco history on file. He reports that he does not drink alcohol or use illicit drugs. family history includes Diabetes in an other family member; Heart disease in an other family member; Hypertension in an other family member; Prostate cancer in an other family member. No Known Allergies Current Outpatient Prescriptions on File Prior to Visit  Medication Sig Dispense Refill  . aspirin EC 81 MG tablet Take 1 tablet (81 mg total) by mouth daily.  90 tablet  11  . fluticasone (FLONASE) 50 MCG/ACT nasal spray Place 1 spray into both nostrils daily.  16 g  5  . guaiFENesin (MUCINEX) 600 MG 12 hr tablet Take 600 mg by mouth 2 (two) times daily.       No current facility-administered medications on file prior to visit.   Review of Systems  Constitutional: Negative for unusual diaphoresis or other sweats  HENT: Negative for ringing in  ear Eyes: Negative for double vision or worsening visual disturbance.  Respiratory: Negative for choking and stridor.   Gastrointestinal: Negative for vomiting or other signifcant bowel change Genitourinary: Negative for hematuria or decreased urine volume.  Musculoskeletal: Negative for other MSK pain or swelling Skin: Negative for color change and worsening wound.  Neurological: Negative for tremors and numbness other than noted  Psychiatric/Behavioral: Negative for decreased concentration or agitation other than above       Objective:   Physical Exam BP 112/64  Pulse 92  Temp(Src) 98.2 F (36.8 C) (Oral)  Ht 6\' 4"  (1.93 m)  Wt 292 lb 6 oz (132.62 kg)  BMI 35.60 kg/m2  SpO2 97% VS noted, mild ill Constitutional: Pt appears well-developed, well-nourished.  HENT: Head: NCAT.  Right Ear: External ear normal.  Left Ear: External ear normal.  Eyes: . Pupils are equal, round, and reactive to light. Conjunctivae and EOM are normal Neck: Normal range of motion. Neck supple.  Bilat tm's with mild erythema.  Max sinus areas mild tender.  Pharynx with mild erythema, no exudate Cardiovascular: Normal rate and regular rhythm.   Pulmonary/Chest: Effort normal and breath sounds normal.  Abd:  Soft, NT, ND, + BS Neurological: Pt is alert. Not confused , motor grossly intact Skin: Skin is warm. No rash Psychiatric: Pt behavior is normal. No agitation.     Assessment & Plan:

## 2014-04-27 NOTE — Progress Notes (Signed)
Pre visit review using our clinic review tool, if applicable. No additional management support is needed unless otherwise documented below in the visit note. 

## 2014-04-27 NOTE — Patient Instructions (Signed)
Please take all new medication as prescribed - the antibiotic, and the protonix for reflux  Please continue all other medications as before, and refills have been done if requested.  Please have the pharmacy call with any other refills you may need.  Please keep your appointments with your specialists as you may have planned

## 2014-05-01 NOTE — Assessment & Plan Note (Signed)
Mild uncontrolled, for protonix 40 qd,  to f/u any worsening symptoms or concerns

## 2014-05-01 NOTE — Assessment & Plan Note (Signed)
stable overall by history and exam, recent data reviewed with pt, and pt to continue medical treatment as before,  to f/u any worsening symptoms or concerns Lab Results  Component Value Date   HGBA1C 6.9* 12/04/2013

## 2014-05-01 NOTE — Assessment & Plan Note (Signed)
Mild to mod, for antibx course,  to f/u any worsening symptoms or concerns 

## 2014-06-30 ENCOUNTER — Encounter: Payer: Self-pay | Admitting: Family

## 2014-06-30 ENCOUNTER — Other Ambulatory Visit (INDEPENDENT_AMBULATORY_CARE_PROVIDER_SITE_OTHER): Payer: BC Managed Care – PPO

## 2014-06-30 ENCOUNTER — Ambulatory Visit (INDEPENDENT_AMBULATORY_CARE_PROVIDER_SITE_OTHER): Payer: BC Managed Care – PPO | Admitting: Family

## 2014-06-30 VITALS — BP 102/72 | HR 88 | Temp 98.1°F | Resp 18 | Ht 75.0 in | Wt 294.8 lb

## 2014-06-30 DIAGNOSIS — R1032 Left lower quadrant pain: Secondary | ICD-10-CM

## 2014-06-30 DIAGNOSIS — R1012 Left upper quadrant pain: Secondary | ICD-10-CM

## 2014-06-30 LAB — CBC WITH DIFFERENTIAL/PLATELET
BASOS ABS: 0.1 10*3/uL (ref 0.0–0.1)
Basophils Relative: 1.4 % (ref 0.0–3.0)
EOS ABS: 0.2 10*3/uL (ref 0.0–0.7)
Eosinophils Relative: 2.6 % (ref 0.0–5.0)
HCT: 44.5 % (ref 39.0–52.0)
HEMOGLOBIN: 14.8 g/dL (ref 13.0–17.0)
LYMPHS PCT: 29 % (ref 12.0–46.0)
Lymphs Abs: 2.7 10*3/uL (ref 0.7–4.0)
MCHC: 33.3 g/dL (ref 30.0–36.0)
MCV: 91.6 fl (ref 78.0–100.0)
Monocytes Absolute: 0.8 10*3/uL (ref 0.1–1.0)
Monocytes Relative: 8.4 % (ref 3.0–12.0)
NEUTROS ABS: 5.5 10*3/uL (ref 1.4–7.7)
Neutrophils Relative %: 58.6 % (ref 43.0–77.0)
PLATELETS: 257 10*3/uL (ref 150.0–400.0)
RBC: 4.86 Mil/uL (ref 4.22–5.81)
RDW: 13.5 % (ref 11.5–15.5)
WBC: 9.4 10*3/uL (ref 4.0–10.5)

## 2014-06-30 MED ORDER — BACLOFEN 10 MG PO TABS: 10.0000 mg | ORAL_TABLET | Freq: Every evening | ORAL | Status: DC | PRN

## 2014-06-30 NOTE — Progress Notes (Signed)
Pre visit review using our clinic review tool, if applicable. No additional management support is needed unless otherwise documented below in the visit note. 

## 2014-06-30 NOTE — Progress Notes (Signed)
   Subjective:    Patient ID: Connor Becker, male    DOB: 1951/04/06, 63 y.o.   MRN: [id number]  Chief Complaint  Patient presents with  . Chest Pain    having pain right up left rib x1 week, more like a sharp annoying pain    HPI:  Connor Becker is a 63 y.o. male who presents today for left-sided pain.   Acute symptoms of dull left sided rib pain started about a week ago. Indicates the pain feels like it is underneath his rib. Denies any trauma to the area, radiating pain elsewhere or is not effected by eating. He denies any change in bowel patterns, fever, chills, or reflux symptoms. Has taken ibuprofen which helps to take the edge off of it and took a muscle relaxer which made him sleep. Indicates the pain today is slightly improved.   No Known Allergies  Current Outpatient Prescriptions on File Prior to Visit  Medication Sig Dispense Refill  . aspirin EC 81 MG tablet Take 1 tablet (81 mg total) by mouth daily. 90 tablet 11  . atorvastatin (LIPITOR) 10 MG tablet Take 1 tablet (10 mg total) by mouth daily. 90 tablet 3  . fluticasone (FLONASE) 50 MCG/ACT nasal spray Place 1 spray into both nostrils daily. 16 g 5  . guaiFENesin (MUCINEX) 600 MG 12 hr tablet Take 600 mg by mouth 2 (two) times daily.    Marland Kitchen levofloxacin (LEVAQUIN) 250 MG tablet Take 1 tablet (250 mg total) by mouth daily. 10 tablet 0  . levothyroxine (SYNTHROID, LEVOTHROID) 25 MCG tablet Take 1 tablet (25 mcg total) by mouth daily. 90 tablet 3  . pantoprazole (PROTONIX) 40 MG tablet Take 1 tablet (40 mg total) by mouth daily. 90 tablet 3   No current facility-administered medications on file prior to visit.    Review of Systems    See HPI  Objective:    BP 102/72 mmHg  Pulse 88  Temp(Src) 98.1 F (36.7 C) (Oral)  Resp 18  Ht 6\' 3"  (1.905 m)  Wt 294 lb 12.8 oz (133.72 kg)  BMI 36.85 kg/m2  SpO2 96% Nursing note and vital signs reviewed.  Physical Exam  Constitutional: He is oriented to person, place,  and time. He appears well-developed and well-nourished. No distress.  Cardiovascular: Normal rate, regular rhythm, normal heart sounds and intact distal pulses.   Pulmonary/Chest: Effort normal and breath sounds normal.  Abdominal: Normal appearance and bowel sounds are normal. He exhibits no distension and no mass. There is no splenomegaly or hepatomegaly. There is tenderness in the left upper quadrant. There is no rigidity, no rebound, no guarding, no tenderness at McBurney's point and negative Murphy's sign.  Neurological: He is alert and oriented to person, place, and time.  Skin: Skin is warm and dry.  Psychiatric: He has a normal mood and affect. His behavior is normal. Judgment and thought content normal.       Assessment & Plan:

## 2014-06-30 NOTE — Assessment & Plan Note (Signed)
Mild left upper quadrant pain present. Abdomen exam otherwise benign. Question muscle skeletal origin. Start baclofen for sleep.  Restart Protonix. Obtain abdominal ultrasound. If symptoms improve prior to ultrasound may discontinue the ultrasound. Follow up if symptoms worsen or fail to improve.

## 2014-06-30 NOTE — Patient Instructions (Signed)
Thank you for choosing [facility].  Summary/Instructions:  Your prescription(s) have been submitted to your pharmacy. Please take as directed and contact our office if you believe you are having problem(s) with the medication(s).  Please stop by the lab on the basement level of the building for your blood work. Your results will be released to [place] (or called to you) after review, usually within 72hours after test completion. If any changes need to be made, you will be notified at that same time.  Referrals have been made during this visit. You should expect to hear back from our schedulers in about 7-10 days in regards to establishing an appointment with the specialists we discussed.   If your symptoms worsen or fail to improve, please contact our office for further instruction, or in case of emergency go directly to the emergency room at the closest medical facility.

## 2014-07-06 ENCOUNTER — Other Ambulatory Visit: Payer: BC Managed Care – PPO

## 2014-09-07 ENCOUNTER — Ambulatory Visit (INDEPENDENT_AMBULATORY_CARE_PROVIDER_SITE_OTHER): Payer: BLUE CROSS/BLUE SHIELD | Admitting: Internal Medicine

## 2014-09-07 ENCOUNTER — Encounter: Payer: Self-pay | Admitting: Internal Medicine

## 2014-09-07 VITALS — BP 126/82 | HR 71 | Temp 97.8°F | Resp 18 | Ht 75.0 in | Wt 290.1 lb

## 2014-09-07 DIAGNOSIS — J018 Other acute sinusitis: Secondary | ICD-10-CM

## 2014-09-07 DIAGNOSIS — E119 Type 2 diabetes mellitus without complications: Secondary | ICD-10-CM

## 2014-09-07 MED ORDER — LEVOFLOXACIN 250 MG PO TABS: 250.0000 mg | ORAL_TABLET | Freq: Every day | ORAL | Status: DC

## 2014-09-07 NOTE — Assessment & Plan Note (Signed)
Mild to mod, for antibx course,  to f/u any worsening symptoms or concerns 

## 2014-09-07 NOTE — Assessment & Plan Note (Signed)
stable overall by history and exam, recent data reviewed with pt, and pt to continue medical treatment as before,  to f/u any worsening symptoms or concerns Lab Results  Component Value Date   HGBA1C 6.9* 12/04/2013

## 2014-09-07 NOTE — Patient Instructions (Signed)
Please take all new medication as prescribed - the antibiotic  Please continue all other medications as before, and refills have been done if requested.  Please have the pharmacy call with any other refills you may need.  Please keep your appointments with your specialists as you may have planned   

## 2014-09-07 NOTE — Progress Notes (Signed)
   Subjective:    Patient ID: Connor Becker, male    DOB: 01-Jun-1951, 64 y.o.   MRN: [id number]  HPI   Here with 2-3 days acute onset fever, facial pain, pressure, headache, general weakness and malaise, and greenish d/c, with mild ST and cough, but pt denies chest pain, wheezing, increased sob or doe, orthopnea, PND, increased LE swelling, palpitations, dizziness or syncope.  Pt denies new neurological symptoms such as new headache, or facial or extremity weakness or numbness  . Pt denies polydipsia, polyuria. Past Medical History  Diagnosis Date  . Impaired glucose tolerance   . HYPOGONADISM, MALE   . HYPERLIPIDEMIA   . INSOMNIA-SLEEP DISORDER-UNSPEC   . SLEEP APNEA, OBSTRUCTIVE   . ALLERGIC RHINITIS   . GERD   . OSTEOARTHRITIS   . Unspecified hypothyroidism 12/04/2013   Past Surgical History  Procedure Laterality Date  . Tonsillectomy    . Nasal septum surgery    . Vasectomy      reports that he has never smoked. He does not have any smokeless tobacco history on file. He reports that he does not drink alcohol or use illicit drugs. family history includes Diabetes in an other family member; Heart disease in an other family member; Hypertension in an other family member; Prostate cancer in an other family member. No Known Allergies Current Outpatient Prescriptions on File Prior to Visit  Medication Sig Dispense Refill  . aspirin EC 81 MG tablet Take 1 tablet (81 mg total) by mouth daily. 90 tablet 11  . guaiFENesin (MUCINEX) 600 MG 12 hr tablet Take 600 mg by mouth 2 (two) times daily.    Marland Kitchen levothyroxine (SYNTHROID, LEVOTHROID) 25 MCG tablet Take 1 tablet (25 mcg total) by mouth daily. 90 tablet 3  . pantoprazole (PROTONIX) 40 MG tablet Take 1 tablet (40 mg total) by mouth daily. 90 tablet 3   No current facility-administered medications on file prior to visit.    Review of Systems All otherwise neg per pt     Objective:   Physical Exam BP 126/82 mmHg  Pulse 71  Temp(Src)  97.8 F (36.6 C) (Oral)  Resp 18  Ht 6\' 3"  (1.905 m)  Wt 290 lb 1.3 oz (131.579 kg)  BMI 36.26 kg/m2  SpO2 97% VS noted, mild ill Constitutional: Pt appears well-developed, well-nourished.  HENT: Head: NCAT.  Right Ear: External ear normal.  Left Ear: External ear normal.  Eyes: . Pupils are equal, round, and reactive to light. Conjunctivae and EOM are normal Bilat tm's with mild erythema.  Max sinus areas mod tender.  Pharynx with mild erythema, no exudate Neck: Normal range of motion. Neck supple.  Cardiovascular: Normal rate and regular rhythm.   Pulmonary/Chest: Effort normal and breath sounds without rales or wheezing.  Neurological: Pt is alert. Not confused , motor grossly intact Skin: Skin is warm. No rash Psychiatric: Pt behavior is normal. No agitation.     Assessment & Plan:

## 2014-09-07 NOTE — Progress Notes (Signed)
Pre visit review using our clinic review tool, if applicable. No additional management support is needed unless otherwise documented below in the visit note. 

## 2015-01-10 ENCOUNTER — Other Ambulatory Visit: Payer: Self-pay

## 2015-04-14 ENCOUNTER — Other Ambulatory Visit (INDEPENDENT_AMBULATORY_CARE_PROVIDER_SITE_OTHER): Payer: Medicare Other

## 2015-04-14 ENCOUNTER — Ambulatory Visit (INDEPENDENT_AMBULATORY_CARE_PROVIDER_SITE_OTHER): Payer: Medicare Other | Admitting: Internal Medicine

## 2015-04-14 VITALS — BP 120/84 | HR 83 | Temp 97.9°F | Ht 75.0 in | Wt 300.0 lb

## 2015-04-14 DIAGNOSIS — E119 Type 2 diabetes mellitus without complications: Secondary | ICD-10-CM | POA: Diagnosis not present

## 2015-04-14 DIAGNOSIS — E785 Hyperlipidemia, unspecified: Secondary | ICD-10-CM

## 2015-04-14 DIAGNOSIS — N320 Bladder-neck obstruction: Secondary | ICD-10-CM

## 2015-04-14 DIAGNOSIS — G5622 Lesion of ulnar nerve, left upper limb: Secondary | ICD-10-CM

## 2015-04-14 DIAGNOSIS — J309 Allergic rhinitis, unspecified: Secondary | ICD-10-CM | POA: Diagnosis not present

## 2015-04-14 DIAGNOSIS — G5620 Lesion of ulnar nerve, unspecified upper limb: Secondary | ICD-10-CM | POA: Insufficient documentation

## 2015-04-14 DIAGNOSIS — E039 Hypothyroidism, unspecified: Secondary | ICD-10-CM

## 2015-04-14 LAB — URINALYSIS, ROUTINE W REFLEX MICROSCOPIC
Bilirubin Urine: NEGATIVE
Hgb urine dipstick: NEGATIVE
KETONES UR: NEGATIVE
Leukocytes, UA: NEGATIVE
NITRITE: NEGATIVE
RBC / HPF: NONE SEEN (ref 0–?)
SPECIFIC GRAVITY, URINE: 1.015 (ref 1.000–1.030)
Total Protein, Urine: NEGATIVE
URINE GLUCOSE: NEGATIVE
Urobilinogen, UA: 0.2 (ref 0.0–1.0)
WBC, UA: NONE SEEN (ref 0–?)
pH: 6 (ref 5.0–8.0)

## 2015-04-14 LAB — CBC WITH DIFFERENTIAL/PLATELET
BASOS PCT: 0.5 % (ref 0.0–3.0)
Basophils Absolute: 0 10*3/uL (ref 0.0–0.1)
EOS ABS: 0.5 10*3/uL (ref 0.0–0.7)
Eosinophils Relative: 4.9 % (ref 0.0–5.0)
HCT: 45.1 % (ref 39.0–52.0)
HEMOGLOBIN: 15.3 g/dL (ref 13.0–17.0)
Lymphocytes Relative: 29.9 % (ref 12.0–46.0)
Lymphs Abs: 2.8 10*3/uL (ref 0.7–4.0)
MCHC: 33.8 g/dL (ref 30.0–36.0)
MCV: 90.7 fl (ref 78.0–100.0)
MONO ABS: 0.8 10*3/uL (ref 0.1–1.0)
Monocytes Relative: 8.9 % (ref 3.0–12.0)
Neutro Abs: 5.1 10*3/uL (ref 1.4–7.7)
Neutrophils Relative %: 55.8 % (ref 43.0–77.0)
Platelets: 273 10*3/uL (ref 150.0–400.0)
RBC: 4.98 Mil/uL (ref 4.22–5.81)
RDW: 13.1 % (ref 11.5–15.5)
WBC: 9.2 10*3/uL (ref 4.0–10.5)

## 2015-04-14 LAB — HEMOGLOBIN A1C: HEMOGLOBIN A1C: 6.8 % — AB (ref 4.6–6.5)

## 2015-04-14 LAB — MICROALBUMIN / CREATININE URINE RATIO
Creatinine,U: 118.4 mg/dL
MICROALB/CREAT RATIO: 0.6 mg/g (ref 0.0–30.0)
Microalb, Ur: <0.7 mg/dL (ref 0.0–1.9)

## 2015-04-14 MED ORDER — PREDNISONE 10 MG PO TABS: ORAL_TABLET | ORAL | Status: DC

## 2015-04-14 MED ORDER — LEVOTHYROXINE SODIUM 25 MCG PO TABS: 25.0000 ug | ORAL_TABLET | Freq: Every day | ORAL | Status: DC

## 2015-04-14 NOTE — Assessment & Plan Note (Addendum)
stable overall by history and exam, recent data reviewed with pt, and pt to continue medical treatment as before,  to f/u any worsening symptoms or concerns Lab Results  Component Value Date   HGBA1C 6.9* 12/04/2013   For f/u labs  Note:  Total time for pt hx, exam, review of record with pt in the room, determination of diagnoses and plan for further eval and tx is > 40 min, with over 50% spent in coordination and counseling of patient

## 2015-04-14 NOTE — Assessment & Plan Note (Signed)
stable overall by history and exam, recent data reviewed with pt, and pt to continue medical treatment as before,  to f/u any worsening symptoms or concerns Lab Results  Component Value Date   LDLCALC 131* 12/04/2013   For lower chol diet, f/u lab today

## 2015-04-14 NOTE — Assessment & Plan Note (Signed)
Mild to mod, for predpac asd,  to f/u any worsening symptoms or concerns, consider NCS and /or neurosurg referral

## 2015-04-14 NOTE — Progress Notes (Signed)
Pre visit review using our clinic review tool, if applicable. No additional management support is needed unless otherwise documented below in the visit note. 

## 2015-04-14 NOTE — Progress Notes (Signed)
Subjective:    Patient ID: Connor Becker, male    DOB: February 01, 1951, 64 y.o.   MRN: [id number]  HPI  Here to f/u; overall doing ok,  Pt denies chest pain, increasing sob or doe, wheezing, orthopnea, PND, increased LE swelling, palpitations, dizziness or syncope.  Pt denies new neurological symptoms such as new headache, or facial or extremity weakness or numbness.  Pt denies polydipsia, polyuria, or low sugar episode.   Pt denies new neurological symptoms such as new headache, or facial or extremity weakness or numbness.   Pt states overall good compliance with meds, mostly trying to follow appropriate diet, with wt overall stable,  but little exercise. Does have several wks ongoing nasal allergy symptoms with clearish congestion, itch and sneezing, without fever, pain, ST, cough, swelling or wheezing.  Has ongoing recurring now almost daily right knee pain, swelling related to walking, gets about 13K steps daily with work. Plans to f/u with orthopedic soon.  Also c/w 2 wks tingling burning itchy type discomfort to the LUE centered at the let elbow with some radiation distally and proxially. No neck pain or LUE weakness. Denies hyper or hypo thyroid symptoms such as voice, skin or hair change.   Past Medical History  Diagnosis Date  . Impaired glucose tolerance   . HYPOGONADISM, MALE   . HYPERLIPIDEMIA   . INSOMNIA-SLEEP DISORDER-UNSPEC   . SLEEP APNEA, OBSTRUCTIVE   . ALLERGIC RHINITIS   . GERD   . OSTEOARTHRITIS   . Unspecified hypothyroidism 12/04/2013   Past Surgical History  Procedure Laterality Date  . Tonsillectomy    . Nasal septum surgery    . Vasectomy      reports that he has never smoked. He does not have any smokeless tobacco history on file. He reports that he does not drink alcohol or use illicit drugs. family history includes Diabetes in an other family member; Heart disease in an other family member; Hypertension in an other family member; Prostate cancer in an other family  member. No Known Allergies Current Outpatient Prescriptions on File Prior to Visit  Medication Sig Dispense Refill  . aspirin EC 81 MG tablet Take 1 tablet (81 mg total) by mouth daily. 90 tablet 11  . pantoprazole (PROTONIX) 40 MG tablet Take 1 tablet (40 mg total) by mouth daily. 90 tablet 3  . guaiFENesin (MUCINEX) 600 MG 12 hr tablet Take 600 mg by mouth 2 (two) times daily.    Marland Kitchen levofloxacin (LEVAQUIN) 250 MG tablet Take 1 tablet (250 mg total) by mouth daily. (Patient not taking: Reported on 04/14/2015) 10 tablet 0   No current facility-administered medications on file prior to visit.   Current Outpatient Prescriptions on File Prior to Visit  Medication Sig Dispense Refill  . aspirin EC 81 MG tablet Take 1 tablet (81 mg total) by mouth daily. 90 tablet 11  . pantoprazole (PROTONIX) 40 MG tablet Take 1 tablet (40 mg total) by mouth daily. 90 tablet 3  . guaiFENesin (MUCINEX) 600 MG 12 hr tablet Take 600 mg by mouth 2 (two) times daily.    Marland Kitchen levofloxacin (LEVAQUIN) 250 MG tablet Take 1 tablet (250 mg total) by mouth daily. (Patient not taking: Reported on 04/14/2015) 10 tablet 0   No current facility-administered medications on file prior to visit.    Review of Systems  Constitutional: Negative for unusual diaphoresis or night sweats HENT: Negative for ringing in ear or discharge Eyes: Negative for double vision or worsening visual disturbance.  Respiratory:  Negative for choking and stridor.   Gastrointestinal: Negative for vomiting or other signifcant bowel change Genitourinary: Negative for hematuria or change in urine volume.  Musculoskeletal: Negative for other MSK pain or swelling Skin: Negative for color change and worsening wound.  Neurological: Negative for tremors and numbness other than noted  Psychiatric/Behavioral: Negative for decreased concentration or agitation other than above       Objective:   Physical Exam BP 120/84 mmHg  Pulse 83  Temp(Src) 97.9 F (36.6  C) (Oral)  Ht 6\' 3"  (1.905 m)  Wt 300 lb (136.079 kg)  BMI 37.50 kg/m2  SpO2 98% VS noted,  Constitutional: Pt appears in no significant distress HENT: Head: NCAT.  Right Ear: External ear normal.  Left Ear: External ear normal.  Eyes: . Pupils are equal, round, and reactive to light. Conjunctivae and EOM are normal Neck: Normal range of motion. Neck supple.  Bilat tm's with mild erythema.  Max sinus areas non tender.  Pharynx with mild erythema, no exudate Cardiovascular: Normal rate and regular rhythm.   Pulmonary/Chest: Effort normal and breath sounds without rales or wheezing.  Abd:  Soft, NT, ND, + BS Neurological: Pt is alert. Not confused , motor 5/5 intact, sens/dtr intact Skin: Skin is warm. No rash, no LE edema Psychiatric: Pt behavior is normal. No agitation.  Right knee with crepitus and small effusion, FROM, NT    Assessment & Plan:

## 2015-04-14 NOTE — Patient Instructions (Addendum)
Please consider a Nurse Visit appt if you would want the Prevnar 13  Please continue all other medications as before, and refills have been done if requested.  Please have the pharmacy call with any other refills you may need.  Please continue your efforts at being more active, low cholesterol diet, and weight control.  You are otherwise up to date with prevention measures today.  Please keep your appointments with your specialists as you may have planned  Please go to the LAB in the Basement (turn left off the elevator) for the tests to be done today  You will be contacted by phone if any changes need to be made immediately.  Otherwise, you will receive a letter about your results with an explanation, but please check with MyChart first.  Please remember to sign up for MyChart if you have not done so, as this will be important to you in the future with finding out test results, communicating by private email, and scheduling acute appointments online when needed.  Please return in 6 months, or sooner if needed

## 2015-04-14 NOTE — Assessment & Plan Note (Signed)
stable overall by history and exam, recent data reviewed with pt, and pt to continue medical treatment as before,  to f/u any worsening symptoms or concerns Lab Results  Component Value Date   TSH 7.32* 12/04/2013

## 2015-04-14 NOTE — Assessment & Plan Note (Signed)
Mild to mod, for otc zyrtec/nasacort asd,  to f/u any worsening symptoms or concerns 

## 2015-04-15 ENCOUNTER — Telehealth: Payer: Self-pay

## 2015-04-15 ENCOUNTER — Other Ambulatory Visit: Payer: Self-pay | Admitting: Internal Medicine

## 2015-04-15 DIAGNOSIS — E119 Type 2 diabetes mellitus without complications: Secondary | ICD-10-CM

## 2015-04-15 LAB — HEPATIC FUNCTION PANEL
ALBUMIN: 4.3 g/dL (ref 3.5–5.2)
ALT: 27 U/L (ref 0–53)
AST: 24 U/L (ref 0–37)
Alkaline Phosphatase: 63 U/L (ref 39–117)
Bilirubin, Direct: 0.1 mg/dL (ref 0.0–0.3)
Total Bilirubin: 0.4 mg/dL (ref 0.2–1.2)
Total Protein: 7.4 g/dL (ref 6.0–8.3)

## 2015-04-15 LAB — LDL CHOLESTEROL, DIRECT: Direct LDL: 186 mg/dL

## 2015-04-15 LAB — BASIC METABOLIC PANEL
BUN: 14 mg/dL (ref 6–23)
CHLORIDE: 101 meq/L (ref 96–112)
CO2: 30 meq/L (ref 19–32)
Calcium: 9.4 mg/dL (ref 8.4–10.5)
Creatinine, Ser: 1.01 mg/dL (ref 0.40–1.50)
GFR: 78.5 mL/min (ref >60.00)
GLUCOSE: 110 mg/dL — AB (ref 70–99)
POTASSIUM: 4 meq/L (ref 3.5–5.1)
Sodium: 139 mEq/L (ref 135–145)

## 2015-04-15 LAB — LIPID PANEL
CHOLESTEROL: 236 mg/dL — AB (ref 0–200)
HDL: 47.1 mg/dL (ref >39.00)
NonHDL: 188.96
Total CHOL/HDL Ratio: 5
Triglycerides: 255 mg/dL — ABNORMAL HIGH (ref 0.0–149.0)
VLDL: 51 mg/dL — ABNORMAL HIGH (ref 0.0–40.0)

## 2015-04-15 LAB — TSH: TSH: 7.91 u[IU]/mL — AB (ref 0.35–4.50)

## 2015-04-15 LAB — PSA: PSA: 2.37 ng/mL (ref 0.10–4.00)

## 2015-04-15 MED ORDER — LEVOTHYROXINE SODIUM 50 MCG PO TABS: 50.0000 ug | ORAL_TABLET | Freq: Every day | ORAL | Status: DC

## 2015-04-15 MED ORDER — ATORVASTATIN CALCIUM 20 MG PO TABS
20.0000 mg | ORAL_TABLET | Freq: Every day | ORAL | Status: DC
Start: 2015-04-15 — End: 2015-11-17

## 2015-04-15 NOTE — Telephone Encounter (Signed)
Referral done

## 2015-04-15 NOTE — Telephone Encounter (Signed)
Pt's spouse is requesting a referral to a Automotive engineer

## 2015-06-07 ENCOUNTER — Ambulatory Visit (INDEPENDENT_AMBULATORY_CARE_PROVIDER_SITE_OTHER): Payer: Medicare Other | Admitting: Internal Medicine

## 2015-06-07 VITALS — BP 132/88 | HR 80 | Temp 97.9°F | Ht 75.0 in | Wt 302.0 lb

## 2015-06-07 DIAGNOSIS — G5622 Lesion of ulnar nerve, left upper limb: Secondary | ICD-10-CM

## 2015-06-07 DIAGNOSIS — E039 Hypothyroidism, unspecified: Secondary | ICD-10-CM | POA: Diagnosis not present

## 2015-06-07 DIAGNOSIS — E119 Type 2 diabetes mellitus without complications: Secondary | ICD-10-CM

## 2015-06-07 NOTE — Patient Instructions (Signed)
Please continue all other medications as before, and refills have been done if requested.  Please have the pharmacy call with any other refills you may need.  Please continue your efforts at being more active, low cholesterol diet, and weight control.  Please keep your appointments with your specialists as you may have planned  You will be contacted regarding the referral for: Nerve condution study  If abnormal, you would need to see neurosurgury

## 2015-06-07 NOTE — Progress Notes (Signed)
Subjective:    Patient ID: Connor Becker, male    DOB: 15-Nov-1950, 64 y.o.   MRN: [id number]  HPI   Here to f/u, unfortunately without significant improvement , similar symptoms of LUE tingling numbness constant, mild but persistent, not really a pain, ongoing now > 3 mo, seems to start at the inner left elbow area, with radiation to the medial upper arm and distal forarm as well.  No weakness or pain. Pt denies new neurological symptoms such as new headache, or facial or extremity weakness or numbness except for the above. Pt denies chest pain, increased sob or doe, wheezing, orthopnea, PND, increased LE swelling, palpitations, dizziness or syncope.  Symptoms No better with nsaid, nothing else seems to make worse, except maybe somewhat worse in the am and less so later in the day Past Medical History  Diagnosis Date  . Impaired glucose tolerance   . HYPOGONADISM, MALE   . HYPERLIPIDEMIA   . INSOMNIA-SLEEP DISORDER-UNSPEC   . SLEEP APNEA, OBSTRUCTIVE   . ALLERGIC RHINITIS   . GERD   . OSTEOARTHRITIS   . Unspecified hypothyroidism 12/04/2013   Past Surgical History  Procedure Laterality Date  . Tonsillectomy    . Nasal septum surgery    . Vasectomy      reports that he has never smoked. He does not have any smokeless tobacco history on file. He reports that he does not drink alcohol or use illicit drugs. family history includes Diabetes in an other family member; Heart disease in an other family member; Hypertension in an other family member; Prostate cancer in an other family member. No Known Allergies Current Outpatient Prescriptions on File Prior to Visit  Medication Sig Dispense Refill  . aspirin EC 81 MG tablet Take 1 tablet (81 mg total) by mouth daily. 90 tablet 11  . atorvastatin (LIPITOR) 20 MG tablet Take 1 tablet (20 mg total) by mouth daily. 90 tablet 3  . guaiFENesin (MUCINEX) 600 MG 12 hr tablet Take 600 mg by mouth 2 (two) times daily.    Marland Kitchen levothyroxine (SYNTHROID,  LEVOTHROID) 50 MCG tablet Take 1 tablet (50 mcg total) by mouth daily. 90 tablet 3  . pantoprazole (PROTONIX) 40 MG tablet Take 1 tablet (40 mg total) by mouth daily. (Patient not taking: Reported on 06/07/2015) 90 tablet 3  . predniSONE (DELTASONE) 10 MG tablet 3 tabs by mouth per day for 3 days,2tabs per day for 3 days,1tab per day for 3 days (Patient not taking: Reported on 06/07/2015) 18 tablet 0   No current facility-administered medications on file prior to visit.   Review of Systems  Constitutional: Negative for unusual diaphoresis or night sweats HENT: Negative for ringing in ear or discharge Eyes: Negative for double vision or worsening visual disturbance.  Respiratory: Negative for choking and stridor.   Gastrointestinal: Negative for vomiting or other signifcant bowel change Genitourinary: Negative for hematuria or change in urine volume.  Musculoskeletal: Negative for other MSK pain or swelling Skin: Negative for color change and worsening wound.  Neurological: Negative for tremors and numbness other than noted  Psychiatric/Behavioral: Negative for decreased concentration or agitation other than above       Objective:   Physical Exam BP 132/88 mmHg  Pulse 80  Temp(Src) 97.9 F (36.6 C) (Oral)  Ht 6\' 3"  (1.905 m)  Wt 302 lb (136.986 kg)  BMI 37.75 kg/m2  SpO2 97% VS noted,  Constitutional: Pt appears in no significant distress HENT: Head: NCAT.  Right Ear: External  ear normal.  Left Ear: External ear normal.  Eyes: . Pupils are equal, round, and reactive to light. Conjunctivae and EOM are normal Neck: Normal range of motion. Neck supple.  Cardiovascular: Normal rate and regular rhythm.   Pulmonary/Chest: Effort normal and breath sounds without rales or wheezing.  Abd:  Soft, NT, ND, + BS Neurological: Pt is alert. Not confused , motor 5/5 intact, sens intact to LT though may dysesthesia like discomfort to LT to forearm, gait intact Skin: Skin is warm. No rash, no LE  edema Psychiatric: Pt behavior is normal. No agitation.     Assessment & Plan:

## 2015-06-07 NOTE — Progress Notes (Signed)
Pre visit review using our clinic review tool, if applicable. No additional management support is needed unless otherwise documented below in the visit note. 

## 2015-06-08 NOTE — Assessment & Plan Note (Signed)
stable overall by history and exam, recent data reviewed with pt, and pt to continue medical treatment as before,  to f/u any worsening symptoms or concerns Lab Results  Component Value Date   TSH 7.91* 04/14/2015   Doubt related to tingling LUE

## 2015-06-08 NOTE — Assessment & Plan Note (Signed)
Suspect most likely cause for symptoms, not improved with conservative tx and prednisone; for NCS/EMG and referral neurology for further consideration,  to f/u any worsening symptoms or concerns

## 2015-06-08 NOTE — Assessment & Plan Note (Signed)
stable overall by history and exam, recent data reviewed with pt, and pt to continue medical treatment as before,  to f/u any worsening symptoms or concerns Lab Results  Component Value Date   HGBA1C 6.8* 04/14/2015

## 2015-06-20 ENCOUNTER — Other Ambulatory Visit: Payer: Self-pay | Admitting: *Deleted

## 2015-06-20 ENCOUNTER — Ambulatory Visit (INDEPENDENT_AMBULATORY_CARE_PROVIDER_SITE_OTHER): Payer: Medicare Other | Admitting: Neurology

## 2015-06-20 DIAGNOSIS — R200 Anesthesia of skin: Secondary | ICD-10-CM

## 2015-06-20 DIAGNOSIS — M5412 Radiculopathy, cervical region: Secondary | ICD-10-CM

## 2015-06-20 NOTE — Procedures (Signed)
[facility]  [address] [place], [address]  [place], [place] [address] Tel: [phone] Fax:  [phone] Test Date:  06/20/2015  Patient: Connor Becker DOB: 1950/10/11 Physician: Narda Amber, DO  Sex: Male Height: 6\' 3"  Ref Phys: Clinton Sawyer.  ID#: [id number] Temp: 32.5C Technician: M. Dean   Patient Complaints: This is a 64 year old gentleman referred for evaluation of right arm paresthesias, radiating from his axilla down his medial forearm and into his last two fingers.  NCV & EMG Findings: Extensive electrodiagnostic testing of the left upper extremity and additional studies of the right shows: 1. Bilateral median, ulnar, In the left dorsal ulnar cutaneous sensory responses are within normal limits. 2. Bilateral median and ulnar motor responses are within normal limits. 3. Chronic motor axon loss changes are seen affecting the C8 myotomes on the left, without accompanied active denervation.  Impression: 1. Chronic C8 radiculopathy affecting the left upper extremity, mild in degree electrically. 2. There is no evidence of any ulnar neuropathy affecting the left upper extremity.   ___________________________ Narda Amber, DO    Nerve Conduction Studies Anti Sensory Summary Table   Site NR Peak (ms) Norm Peak (ms) P-T Amp (V) Norm P-T Amp  Left DorsCutan Anti Sensory (Dorsum 5th MC)  32.5C  Wrist    1.3 <3.2 10.2 >5  Left Median Anti Sensory (2nd Digit)  32.5C  Wrist    3.6 <3.8 16.8 >10  Right Median Anti Sensory (2nd Digit)  32.5C  Wrist    3.7 <3.8 14.4 >10  Left Ulnar Anti Sensory (5th Digit)  32.5C  Wrist    3.1 <3.2 7.0 >5  Right Ulnar Anti Sensory (5th Digit)  32.5C  Wrist    3.1 <3.2 8.3 >5   Motor Summary Table   Site NR Onset (ms) Norm Onset (ms) O-P Amp (mV) Norm O-P Amp Site1 Site2 Delta-0 (ms) Dist (cm) Vel (m/s) Norm Vel (m/s)  Left Median Motor (Abd Poll Brev)  32.5C  Wrist    3.8 <4.0 8.4 >5 Elbow Wrist 4.6 27.0 59 >50  Elbow     8.4  7.8         Right Median Motor (Abd Poll Brev)  32.5C  Wrist    3.7 <4.0 6.4 >5 Elbow Wrist 4.7 28.0 60 >50  Elbow    8.4  5.9         Left Ulnar Motor (Abd Dig Minimi)  32.5C  Wrist    3.1 <3.1 8.6 >7 B Elbow Wrist 4.3 24.0 56 >50  B Elbow    7.4  8.4  A Elbow B Elbow 1.8 10.0 56 >50  A Elbow    9.2  8.2         Right Ulnar Motor (Abd Dig Minimi)  32.5C  Wrist    3.0 <3.1 9.6 >7 B Elbow Wrist 4.3 24.0 56 >50  B Elbow    7.3  9.4  A Elbow B Elbow 1.8 10.0 56 >50  A Elbow    9.1  8.9          EMG   Side Muscle Ins Act Fibs Psw Fasc Number Recrt Dur Dur. Amp Amp. Poly Poly. Comment  Left 1stDorInt Nml Nml Nml Nml Nml Nml Nml Nml Nml Nml Nml Nml N/A  Left FlexPolLong Nml Nml Nml Nml 1- Rapid Some 1+ Few 1+ Nml Nml N/A  Left Ext Indicis Nml Nml Nml Nml 1- Rapid Some 1+ Few 1+ Nml Nml N/A  Left PronatorTeres  Nml Nml Nml Nml Nml Nml Nml Nml Nml Nml Nml Nml N/A  Left Biceps Nml Nml Nml Nml Nml Nml Nml Nml Nml Nml Nml Nml N/A  Left Triceps Nml Nml Nml Nml 1- Mod-R Some 1+ Some 1+ Nml Nml N/A  Left Deltoid Nml Nml Nml Nml Nml Nml Nml Nml Nml Nml Nml Nml N/A      Waveforms:

## 2015-06-21 ENCOUNTER — Encounter: Payer: Self-pay | Admitting: Internal Medicine

## 2015-06-21 ENCOUNTER — Other Ambulatory Visit: Payer: Self-pay | Admitting: Internal Medicine

## 2015-06-21 DIAGNOSIS — M5412 Radiculopathy, cervical region: Secondary | ICD-10-CM

## 2015-08-23 DIAGNOSIS — J322 Chronic ethmoidal sinusitis: Secondary | ICD-10-CM | POA: Diagnosis not present

## 2015-08-23 DIAGNOSIS — J301 Allergic rhinitis due to pollen: Secondary | ICD-10-CM | POA: Diagnosis not present

## 2015-08-23 DIAGNOSIS — J320 Chronic maxillary sinusitis: Secondary | ICD-10-CM | POA: Diagnosis not present

## 2015-08-23 DIAGNOSIS — J370 Chronic laryngitis: Secondary | ICD-10-CM | POA: Diagnosis not present

## 2015-08-24 DIAGNOSIS — M1712 Unilateral primary osteoarthritis, left knee: Secondary | ICD-10-CM | POA: Diagnosis not present

## 2015-08-24 DIAGNOSIS — M1711 Unilateral primary osteoarthritis, right knee: Secondary | ICD-10-CM | POA: Diagnosis not present

## 2015-10-11 ENCOUNTER — Encounter (INDEPENDENT_AMBULATORY_CARE_PROVIDER_SITE_OTHER): Payer: Self-pay

## 2015-10-20 DIAGNOSIS — H401333 Pigmentary glaucoma, bilateral, severe stage: Secondary | ICD-10-CM | POA: Diagnosis not present

## 2015-11-17 ENCOUNTER — Encounter: Payer: Self-pay | Admitting: Internal Medicine

## 2015-11-17 ENCOUNTER — Ambulatory Visit (INDEPENDENT_AMBULATORY_CARE_PROVIDER_SITE_OTHER): Payer: Commercial Managed Care - PPO | Admitting: Internal Medicine

## 2015-11-17 ENCOUNTER — Other Ambulatory Visit (INDEPENDENT_AMBULATORY_CARE_PROVIDER_SITE_OTHER): Payer: Commercial Managed Care - PPO

## 2015-11-17 VITALS — BP 124/80 | HR 81 | Temp 98.4°F | Resp 20 | Wt 302.0 lb

## 2015-11-17 DIAGNOSIS — E291 Testicular hypofunction: Secondary | ICD-10-CM

## 2015-11-17 DIAGNOSIS — E785 Hyperlipidemia, unspecified: Secondary | ICD-10-CM

## 2015-11-17 DIAGNOSIS — IMO0002 Reserved for concepts with insufficient information to code with codable children: Secondary | ICD-10-CM | POA: Insufficient documentation

## 2015-11-17 DIAGNOSIS — Z1159 Encounter for screening for other viral diseases: Secondary | ICD-10-CM | POA: Diagnosis not present

## 2015-11-17 DIAGNOSIS — R972 Elevated prostate specific antigen [PSA]: Secondary | ICD-10-CM

## 2015-11-17 DIAGNOSIS — E039 Hypothyroidism, unspecified: Secondary | ICD-10-CM | POA: Diagnosis not present

## 2015-11-17 DIAGNOSIS — M179 Osteoarthritis of knee, unspecified: Secondary | ICD-10-CM

## 2015-11-17 DIAGNOSIS — E119 Type 2 diabetes mellitus without complications: Secondary | ICD-10-CM

## 2015-11-17 DIAGNOSIS — M1710 Unilateral primary osteoarthritis, unspecified knee: Secondary | ICD-10-CM

## 2015-11-17 LAB — BASIC METABOLIC PANEL
BUN: 20 mg/dL (ref 6–23)
CO2: 26 mEq/L (ref 19–32)
Calcium: 9.1 mg/dL (ref 8.4–10.5)
Chloride: 103 mEq/L (ref 96–112)
Creatinine, Ser: 0.95 mg/dL (ref 0.40–1.50)
GFR: 84.1 mL/min (ref >60.00)
GLUCOSE: 148 mg/dL — AB (ref 70–99)
POTASSIUM: 4.1 meq/L (ref 3.5–5.1)
SODIUM: 137 meq/L (ref 135–145)

## 2015-11-17 LAB — LIPID PANEL
CHOL/HDL RATIO: 5
Cholesterol: 208 mg/dL — ABNORMAL HIGH (ref 0–200)
HDL: 43.7 mg/dL (ref >39.00)
NonHDL: 164.21
Triglycerides: 300 mg/dL — ABNORMAL HIGH (ref 0.0–149.0)
VLDL: 60 mg/dL — AB (ref 0.0–40.0)

## 2015-11-17 LAB — PSA: PSA: 3.53 ng/mL (ref 0.10–4.00)

## 2015-11-17 LAB — T4, FREE: Free T4: 0.89 ng/dL (ref 0.60–1.60)

## 2015-11-17 LAB — HEPATIC FUNCTION PANEL
ALK PHOS: 60 U/L (ref 39–117)
ALT: 22 U/L (ref 0–53)
AST: 20 U/L (ref 0–37)
Albumin: 4 g/dL (ref 3.5–5.2)
BILIRUBIN DIRECT: 0.1 mg/dL (ref 0.0–0.3)
BILIRUBIN TOTAL: 0.5 mg/dL (ref 0.2–1.2)
Total Protein: 6.9 g/dL (ref 6.0–8.3)

## 2015-11-17 LAB — TESTOSTERONE: TESTOSTERONE: 140.71 ng/dL — AB (ref 300.00–890.00)

## 2015-11-17 LAB — TSH: TSH: 3.58 u[IU]/mL (ref 0.35–4.50)

## 2015-11-17 LAB — LDL CHOLESTEROL, DIRECT: Direct LDL: 148 mg/dL

## 2015-11-17 LAB — HEMOGLOBIN A1C: HEMOGLOBIN A1C: 7 % — AB (ref 4.6–6.5)

## 2015-11-17 MED ORDER — LEVOTHYROXINE SODIUM 50 MCG PO TABS: 50.0000 ug | ORAL_TABLET | Freq: Every day | ORAL | Status: DC

## 2015-11-17 MED ORDER — MELOXICAM 15 MG PO TABS: 15.0000 mg | ORAL_TABLET | Freq: Every day | ORAL | Status: DC

## 2015-11-17 MED ORDER — ESOMEPRAZOLE MAGNESIUM 40 MG PO CPDR: 40.0000 mg | DELAYED_RELEASE_CAPSULE | Freq: Every day | ORAL | Status: DC

## 2015-11-17 NOTE — Assessment & Plan Note (Signed)
stable overall by history and exam, recent data reviewed with pt, and pt to continue medical treatment as before,  to f/u any worsening symptoms or concerns Lab Results  Component Value Date   LDLCALC 131* 12/04/2013   For f/u lipids, consider change to crestor

## 2015-11-17 NOTE — Assessment & Plan Note (Signed)
stable overall by history and exam, recent data reviewed with pt, and pt to continue medical treatment as before,  to f/u any worsening symptoms or concerns Lab Results  Component Value Date   HGBA1C 6.8* 04/14/2015

## 2015-11-17 NOTE — Assessment & Plan Note (Signed)
Pt interested in re-start testost replacement in IM shots, for level f/u, consider rx

## 2015-11-17 NOTE — Patient Instructions (Addendum)

## 2015-11-17 NOTE — Progress Notes (Signed)
Pre visit review using our clinic review tool, if applicable. No additional management support is needed unless otherwise documented below in the visit note. 

## 2015-11-17 NOTE — Progress Notes (Signed)
Subjective:    Patient ID: Connor Becker, male    DOB: [birth year], 65 y.o.   MRN: [id number]  HPI  Here to f/u; overall doing ok,  Pt denies chest pain, increasing sob or doe, wheezing, orthopnea, PND, increased LE swelling, palpitations, dizziness or syncope.  Pt denies new neurological symptoms such as new headache, or facial or extremity weakness or numbness.  Pt denies polydipsia, polyuria, or low sugar episode.   Pt denies new neurological symptoms such as new headache, or facial or extremity weakness or numbness.   Pt states overall good compliance with meds, mostly trying to follow appropriate diet, with wt overall stable,  but little exercise however.  Legs cramps with lipitor, but would try crestor  Would like to returnt to nexiuma nd helped more with throat symtpoms than protonix. Did have bilat cortisone to knees per ortho, only lasteed 4-5 wks.  /fat Etiology unclear, Exam otherwise benign, to check labs as documented, follow with expectant management, asks for nsaid as ibuprofen at home not working as well. Denies urinary symptoms such as dysuria, frequency, urgency, flank pain, hematuria or n/v, fever, chills. Past Medical History  Diagnosis Date  . Impaired glucose tolerance   . HYPOGONADISM, MALE   . HYPERLIPIDEMIA   . INSOMNIA-SLEEP DISORDER-UNSPEC   . SLEEP APNEA, OBSTRUCTIVE   . ALLERGIC RHINITIS   . GERD   . OSTEOARTHRITIS   . Unspecified hypothyroidism 12/04/2013   Past Surgical History  Procedure Laterality Date  . Tonsillectomy    . Nasal septum surgery    . Vasectomy      reports that he has never smoked. He does not have any smokeless tobacco history on file. He reports that he does not drink alcohol or use illicit drugs. family history is not on file. Allergies  Allergen Reactions  . Lipitor [Atorvastatin] Other (See Comments)    Leg cramps   Current Outpatient Prescriptions on File Prior to Visit  Medication Sig Dispense Refill  . aspirin EC 81 MG  tablet Take 1 tablet (81 mg total) by mouth daily. 90 tablet 11   No current facility-administered medications on file prior to visit.   Review of Systems  Constitutional: Negative for unusual diaphoresis or night sweats HENT: Negative for ear swelling or discharge Eyes: Negative for worsening visual haziness  Respiratory: Negative for choking and stridor.   Gastrointestinal: Negative for distension or worsening eructation Genitourinary: Negative for retention or change in urine volume.  Musculoskeletal: Negative for other MSK pain or swelling Skin: Negative for color change and worsening wound Neurological: Negative for tremors and numbness other than noted  Psychiatric/Behavioral: Negative for decreased concentration or agitation other than above       Objective:   Physical Exam BP 124/80 mmHg  Pulse 81  Temp(Src) 98.4 F (36.9 C) (Oral)  Resp 20  Wt 302 lb (136.986 kg)  SpO2 98% VS noted,  Constitutional: Pt appears in no apparent distress HENT: Head: NCAT.  Right Ear: External ear normal.  Left Ear: External ear normal.  Eyes: . Pupils are equal, round, and reactive to light. Conjunctivae and EOM are normal Neck: Normal range of motion. Neck supple.  Cardiovascular: Normal rate and regular rhythm.   Pulmonary/Chest: Effort normal and breath sounds without rales or wheezing.  Abd:  Soft, NT, ND, + BS Neurological: Pt is alert. Not confused , motor grossly intact Skin: Skin is warm. No rash, no LE edema Psychiatric: Pt behavior is normal. No agitation.  Assessment & Plan:

## 2015-11-17 NOTE — Assessment & Plan Note (Signed)
With bilat knees , for mobic prn,  to f/u any worsening symptoms or concerns

## 2015-11-17 NOTE — Assessment & Plan Note (Signed)
stable overall by history and exam, recent data reviewed with pt, and pt to continue medical treatment as before,  to f/u any worsening symptoms or concerns Lab Results  Component Value Date   TSH 7.91* 04/14/2015   For tsh re-check, consider increased synthroid for elev tsh

## 2015-11-17 NOTE — Assessment & Plan Note (Signed)
Asympt, for f/u psa,  to f/u any worsening symptoms or concerns 

## 2015-11-18 ENCOUNTER — Other Ambulatory Visit: Payer: Self-pay | Admitting: Internal Medicine

## 2015-11-18 LAB — HEPATITIS C ANTIBODY: HCV AB: NEGATIVE

## 2015-11-18 MED ORDER — ROSUVASTATIN CALCIUM 20 MG PO TABS: 20.0000 mg | ORAL_TABLET | Freq: Every day | ORAL | Status: DC

## 2015-11-18 MED ORDER — METFORMIN HCL ER 500 MG PO TB24: 500.0000 mg | ORAL_TABLET | Freq: Every day | ORAL | Status: DC

## 2016-01-25 ENCOUNTER — Ambulatory Visit: Payer: Commercial Managed Care - PPO | Admitting: Internal Medicine

## 2016-01-26 ENCOUNTER — Ambulatory Visit: Payer: Commercial Managed Care - PPO | Admitting: Internal Medicine

## 2016-01-30 ENCOUNTER — Telehealth: Payer: Self-pay | Admitting: Internal Medicine

## 2016-01-30 ENCOUNTER — Encounter: Payer: Self-pay | Admitting: Family Medicine

## 2016-01-30 ENCOUNTER — Ambulatory Visit (INDEPENDENT_AMBULATORY_CARE_PROVIDER_SITE_OTHER): Payer: Commercial Managed Care - PPO | Admitting: Family Medicine

## 2016-01-30 VITALS — BP 120/82 | HR 80 | Temp 98.2°F | Wt 279.1 lb

## 2016-01-30 DIAGNOSIS — R42 Dizziness and giddiness: Secondary | ICD-10-CM | POA: Diagnosis not present

## 2016-01-30 NOTE — Progress Notes (Signed)
Subjective:    Patient ID: Connor Becker, male    DOB: Nov 28, 1950, 65 y.o.   MRN: [id number]  HPI  Connor Becker is a 65 year old male who presents today with episodes of dizziness that started 5 days ago. He reports that this symptom has been triggered after "a sinus infection" which his wife self diagnosed and treated with Septra she had at home.  Denies fever, chills, sweats, rhinitis, sinus pressure/pain, ear pain/fullness, hearing loss, cough, sore throat, numbness, tingling, weakness, visual disturbances, headaches, dysphagia, or trauma . He denies nausea and vomiting, however his wife provided him with promethazine and meclizine "in order to prevent N/V" Treatment at home with Septra 500 mg BID for 10 days that were given to him by his wife at home. Septra was his wife's medication. She also provided  promethazine and meclizine which she also had at home.  He reports that meclizine provided limited benefit.  Review of Systems  Constitutional: Negative for fever, chills and fatigue.  HENT: Negative for congestion, postnasal drip, rhinorrhea, sinus pressure, sneezing and sore throat.        History of tinnitus; not present today  Eyes: Negative for visual disturbance.  Respiratory: Negative for cough, shortness of breath and wheezing.   Cardiovascular: Negative for chest pain and palpitations.  Gastrointestinal: Negative for vomiting, abdominal pain, diarrhea, constipation and blood in stool.  Genitourinary: Negative for dysuria, urgency, frequency and hematuria.  Musculoskeletal: Negative for myalgias.  Skin: Negative for rash.  Neurological: Positive for dizziness. Negative for tremors, syncope, weakness, light-headedness, numbness and headaches.  Psychiatric/Behavioral:       Denies depressed or anxious mood   Past Medical History  Diagnosis Date  . Impaired glucose tolerance   . HYPOGONADISM, MALE   . HYPERLIPIDEMIA   . INSOMNIA-SLEEP DISORDER-UNSPEC   . SLEEP APNEA,  OBSTRUCTIVE   . ALLERGIC RHINITIS   . GERD   . OSTEOARTHRITIS   . Unspecified hypothyroidism 12/04/2013     Social History   Social History  . Marital Status: Married    Spouse Name: N/A  . Number of Children: N/A  . Years of Education: N/A   Occupational History  . Not on file.   Social History Main Topics  . Smoking status: Never Smoker   . Smokeless tobacco: Not on file  . Alcohol Use: No  . Drug Use: No  . Sexual Activity: Not on file   Other Topics Concern  . Not on file   Social History Narrative    Past Surgical History  Procedure Laterality Date  . Tonsillectomy    . Nasal septum surgery    . Vasectomy      Family History  Problem Relation Age of Onset  . Diabetes    . Hypertension    . Prostate cancer    . Heart disease      Allergies  Allergen Reactions  . Lipitor [Atorvastatin] Other (See Comments)    Leg cramps    Current Outpatient Prescriptions on File Prior to Visit  Medication Sig Dispense Refill  . aspirin EC 81 MG tablet Take 1 tablet (81 mg total) by mouth daily. 90 tablet 11  . esomeprazole (NEXIUM) 40 MG capsule Take 1 capsule (40 mg total) by mouth daily. 30 capsule 3  . levothyroxine (SYNTHROID, LEVOTHROID) 50 MCG tablet Take 1 tablet (50 mcg total) by mouth daily. 90 tablet 3  . meloxicam (MOBIC) 15 MG tablet Take 1 tablet (15 mg total) by mouth daily. [place]  tablet 3  . metFORMIN (GLUCOPHAGE XR) 500 MG 24 hr tablet Take 1 tablet (500 mg total) by mouth daily with breakfast. 90 tablet 3   No current facility-administered medications on file prior to visit.    BP 120/82 mmHg  Pulse 80  Temp(Src) 98.2 F (36.8 C) (Oral)  Wt 279 lb 2 oz (126.61 kg)     Objective:   Physical Exam  Constitutional: He appears well-developed and well-nourished.  HENT:  Right Ear: Tympanic membrane normal.  Left Ear: Tympanic membrane normal.  Nose: No rhinorrhea. Right sinus exhibits no maxillary sinus tenderness and no frontal sinus tenderness.  Left sinus exhibits no maxillary sinus tenderness and no frontal sinus tenderness.  Mouth/Throat: Mucous membranes are normal. No oropharyngeal exudate or posterior oropharyngeal erythema.  Eyes: Pupils are equal, round, and reactive to light. No scleral icterus.  Neck: Neck supple.  Cardiovascular: Normal rate, regular rhythm and intact distal pulses.   Pulmonary/Chest: Effort normal and breath sounds normal. He has no wheezes. He has no rales.  Abdominal: Soft. Bowel sounds are normal. There is no tenderness.  Musculoskeletal: He exhibits no edema.  Lymphadenopathy:    He has no cervical adenopathy.  Neurological: He is alert. He has normal strength. No cranial nerve deficit or sensory deficit. He displays a negative Romberg sign. Gait normal.  Reflex Scores:      Brachioradialis reflexes are 2+ on the right side and 2+ on the left side.      Patellar reflexes are 2+ on the right side and 2+ on the left side. Negative Dix-Hallpike II-Visual fields grossly intact. III/IV/VI-Extraocular movements intact. Pupils reactive bilaterally. V/VII-Smile symmetric, equal eyebrow raise, facial sensation intact VIII- Hearing grossly intact XI-bilateral shoulder shrug XII-midline tongue extension Motor: 5/5 bilaterally with normal tone and bulk Cerebellar: Normal finger-to-nose   Romberg negative Ambulates with a coordinated gait       Assessment & Plan:  1. Dizziness and giddiness Neurological history and exam are reassuring. Suspect symptoms may be related to either resolving sinus symptoms, use of several medications which were not prescribed by a provider, or benign positional vertigo.  Orthostatic Blood pressures were checked and no orthostatic hypotension is present. Advised patient to discontinue use of medications that he mentioned such as Septra and promethazine. Advised that meclizine can be used for symptoms. Further advised patient to follow up with his PCP in one week. Also  discussed warning symptoms and advised that he seek immediate medical attention if symptoms occur or dizziness worsens. Patient and wife verbalized understanding and agreed with plan. I spent 25 minutes with this patient and greater than 50% of the time was spent in face to face counseling. Medications and side effects from medications which may cause dizziness were discussed at length.    Delano Metz, FNP-C

## 2016-01-30 NOTE — Telephone Encounter (Signed)
Patient Name: Connor Becker DOB: 16-Jun-1951 Initial Comment husband has been really dizzy, cant stand, feels dizzy even when he lays down, some nausea here/ there, just got over a sinus infection. no other symptoms Nurse Assessment Nurse: Ronnald Ramp, RN, Miranda Date/Time (Eastern Time): 01/30/2016 8:29:41 AM Confirm and document reason for call. If symptomatic, describe symptoms. You must click the next button to save text entered. ---Caller states her husband has been dizzy for several days. Also with nausea. Has the patient traveled out of the country within the last 30 days? ---No Does the patient have any new or worsening symptoms? ---Yes Will a triage be completed? ---Yes Related visit to physician within the last 2 weeks? ---No Does the PT have any chronic conditions? (i.e. diabetes, asthma, etc.) ---Yes List chronic conditions. ---Thyroid, pre-diabetes, Is this a behavioral health or substance abuse call? ---No Guidelines Guideline Title Affirmed Question Affirmed Notes Dizziness - Vertigo [1] MODERATE dizziness (e.g., vertigo; feels very unsteady, interferes with normal activities) AND [2] has NOT been evaluated by physician for this Final Disposition User See Physician within 4 Hours (or PCP triage) Ronnald Ramp, RN, Miranda Comments No appt available with PCP or primary office. Appt scheduled for 11am with Almira Coaster, NP and [place] location. Referrals GO TO FACILITY OTHER - SPECIFY Disagree/Comply: Comply

## 2016-01-30 NOTE — Patient Instructions (Signed)
Stop medications that could be causing dizziness. If your symptom does not improve, worsen, or you develop additional symptoms please seek medical attention immediately. You may continue meclizine for dizziness. Follow up with your PCP in one week.  Dizziness Dizziness is a common problem. It is a feeling of unsteadiness or light-headedness. You may feel like you are about to faint. Dizziness can lead to injury if you stumble or fall. Anyone can become dizzy, but dizziness is more common in older adults. This condition can be caused by a number of things, including medicines, dehydration, or illness. HOME CARE INSTRUCTIONS Taking these steps may help with your condition: Eating and Drinking  Drink enough fluid to keep your urine clear or pale yellow. This helps to keep you from becoming dehydrated. Try to drink more clear fluids, such as water.  Do not drink alcohol.  Limit your caffeine intake if directed by your health care provider.  Limit your salt intake if directed by your health care provider. Activity  Avoid making quick movements.  Rise slowly from chairs and steady yourself until you feel okay.  In the morning, first sit up on the side of the bed. When you feel okay, stand slowly while you hold onto something until you know that your balance is fine.  Move your legs often if you need to stand in one place for a long time. Tighten and relax your muscles in your legs while you are standing.  Do not drive or operate heavy machinery if you feel dizzy.  Avoid bending down if you feel dizzy. Place items in your home so that they are easy for you to reach without leaning over. Lifestyle  Do not use any tobacco products, including cigarettes, chewing tobacco, or electronic cigarettes. If you need help quitting, ask your health care provider.  Try to reduce your stress level, such as with yoga or meditation. Talk with your health care provider if you need help. General  Instructions  Watch your dizziness for any changes.  Take medicines only as directed by your health care provider. Talk with your health care provider if you think that your dizziness is caused by a medicine that you are taking.  Tell a friend or a family member that you are feeling dizzy. If he or she notices any changes in your behavior, have this person call your health care provider.  Keep all follow-up visits as directed by your health care provider. This is important. SEEK MEDICAL CARE IF:  Your dizziness does not go away.  Your dizziness or light-headedness gets worse.  You feel nauseous.  You have reduced hearing.  You have new symptoms.  You are unsteady on your feet or you feel like the room is spinning. SEEK IMMEDIATE MEDICAL CARE IF:  You vomit or have diarrhea and are unable to eat or drink anything.  You have problems talking, walking, swallowing, or using your arms, hands, or legs.  You feel generally weak.  You are not thinking clearly or you have trouble forming sentences. It may take a friend or family member to notice this.  You have chest pain, abdominal pain, shortness of breath, or sweating.  Your vision changes.  You notice any bleeding.  You have a headache.  You have neck pain or a stiff neck.  You have a fever.   This information is not intended to replace advice given to you by your health care provider. Make sure you discuss any questions you have with your health  care provider.   Document Released: 12/26/2000 Document Revised: 11/16/2014 Document Reviewed: 06/28/2014 Elsevier Interactive Patient Education [employer].

## 2016-01-31 ENCOUNTER — Ambulatory Visit: Payer: Commercial Managed Care - PPO | Admitting: Internal Medicine

## 2016-02-01 ENCOUNTER — Other Ambulatory Visit: Payer: Self-pay | Admitting: Internal Medicine

## 2016-02-01 ENCOUNTER — Other Ambulatory Visit (INDEPENDENT_AMBULATORY_CARE_PROVIDER_SITE_OTHER): Payer: Commercial Managed Care - PPO

## 2016-02-01 ENCOUNTER — Encounter: Payer: Self-pay | Admitting: Internal Medicine

## 2016-02-01 ENCOUNTER — Ambulatory Visit (INDEPENDENT_AMBULATORY_CARE_PROVIDER_SITE_OTHER): Payer: Commercial Managed Care - PPO | Admitting: Internal Medicine

## 2016-02-01 VITALS — BP 122/76 | HR 97 | Temp 98.4°F | Resp 20 | Wt 296.0 lb

## 2016-02-01 DIAGNOSIS — R42 Dizziness and giddiness: Secondary | ICD-10-CM

## 2016-02-01 DIAGNOSIS — E119 Type 2 diabetes mellitus without complications: Secondary | ICD-10-CM

## 2016-02-01 DIAGNOSIS — E039 Hypothyroidism, unspecified: Secondary | ICD-10-CM

## 2016-02-01 DIAGNOSIS — R5383 Other fatigue: Secondary | ICD-10-CM

## 2016-02-01 LAB — CBC WITH DIFFERENTIAL/PLATELET
Basophils Absolute: 0.1 10*3/uL (ref 0.0–0.1)
Basophils Relative: 0.7 % (ref 0.0–3.0)
EOS ABS: 0.2 10*3/uL (ref 0.0–0.7)
Eosinophils Relative: 2 % (ref 0.0–5.0)
HCT: 47 % (ref 39.0–52.0)
Hemoglobin: 15.8 g/dL (ref 13.0–17.0)
LYMPHS ABS: 3.2 10*3/uL (ref 0.7–4.0)
Lymphocytes Relative: 31.1 % (ref 12.0–46.0)
MCHC: 33.6 g/dL (ref 30.0–36.0)
MCV: 89.2 fl (ref 78.0–100.0)
MONO ABS: 0.9 10*3/uL (ref 0.1–1.0)
Monocytes Relative: 8.8 % (ref 3.0–12.0)
NEUTROS ABS: 5.9 10*3/uL (ref 1.4–7.7)
NEUTROS PCT: 57.4 % (ref 43.0–77.0)
PLATELETS: 320 10*3/uL (ref 150.0–400.0)
RBC: 5.27 Mil/uL (ref 4.22–5.81)
RDW: 13.1 % (ref 11.5–15.5)
WBC: 10.2 10*3/uL (ref 4.0–10.5)

## 2016-02-01 LAB — BASIC METABOLIC PANEL
BUN: 13 mg/dL (ref 6–23)
CHLORIDE: 101 meq/L (ref 96–112)
CO2: 29 meq/L (ref 19–32)
CREATININE: 0.96 mg/dL (ref 0.40–1.50)
Calcium: 10 mg/dL (ref 8.4–10.5)
GFR: 83.03 mL/min (ref >60.00)
Glucose, Bld: 100 mg/dL — ABNORMAL HIGH (ref 70–99)
Potassium: 3.9 mEq/L (ref 3.5–5.1)
Sodium: 137 mEq/L (ref 135–145)

## 2016-02-01 LAB — HEPATIC FUNCTION PANEL
ALK PHOS: 70 U/L (ref 39–117)
ALT: 24 U/L (ref 0–53)
AST: 19 U/L (ref 0–37)
Albumin: 4.3 g/dL (ref 3.5–5.2)
BILIRUBIN DIRECT: 0.1 mg/dL (ref 0.0–0.3)
TOTAL PROTEIN: 7.6 g/dL (ref 6.0–8.3)
Total Bilirubin: 0.5 mg/dL (ref 0.2–1.2)

## 2016-02-01 LAB — TSH: TSH: 9.51 u[IU]/mL — ABNORMAL HIGH (ref 0.35–4.50)

## 2016-02-01 MED ORDER — LEVOTHYROXINE SODIUM 88 MCG PO TABS: 88.0000 ug | ORAL_TABLET | Freq: Every day | ORAL | Status: DC

## 2016-02-01 MED ORDER — ESOMEPRAZOLE MAGNESIUM 40 MG PO CPDR: 40.0000 mg | DELAYED_RELEASE_CAPSULE | Freq: Every day | ORAL | Status: DC

## 2016-02-01 NOTE — Assessment & Plan Note (Signed)
Mild, improving, cont melcizine prn , has at home

## 2016-02-01 NOTE — Patient Instructions (Signed)
Please continue all other medications as before, including the nexium refill today  Please have the pharmacy call with any other refills you may need.  Please continue your efforts at being more active, low cholesterol diet, and weight control.  Please keep your appointments with your specialists as you may have planned  Please go to the LAB in the Basement (turn left off the elevator) for the tests to be done today  You will be contacted by phone if any changes need to be made immediately.  Otherwise, you will receive a letter about your results with an explanation, but please check with MyChart first.  Please remember to sign up for MyChart if you have not done so, as this will be important to you in the future with finding out test results, communicating by private email, and scheduling acute appointments online when needed.

## 2016-02-01 NOTE — Progress Notes (Signed)
Pre visit review using our clinic review tool, if applicable. No additional management support is needed unless otherwise documented below in the visit note. 

## 2016-02-01 NOTE — Assessment & Plan Note (Signed)
stable overall by history and exam, recent data reviewed with pt, and pt to continue medical treatment as before,  to f/u any worsening symptoms or concerns Lab Results  Component Value Date   HGBA1C 7.0* 11/17/2015

## 2016-02-01 NOTE — Progress Notes (Signed)
Subjective:    Patient ID: Connor Becker, male    DOB: 09-09-1950, 65 y.o.   MRN: [id number]  HPI  Here to f/u  -with vertigo -  Missed 2 days work., started July 4 wknd, working in a moldy house, then developed sinus infection and seemed much improved after 10 days setpra ds, then 5 days ago started with progressively worsening dizziness, so that was mild to start, then worse with holding onto the walls, then the third day stayed in bed.  Pt denies fever, wt loss, night sweats, loss of appetite, or other constitutional symptoms  No ear pain, congestion, HA.  Wife called [facility], seen at [facility] at [place], meclizine has seemed to help somewhat before that, but at [place] [facility] (wife drives him thius week).  Has been drinking Much fluids per wife insistence. Appetite not so good, but no wt loss.  Denies worsening reflux, abd pain, dysphagia, n/v, bowel change or blood, except for minor nausea.  Overall feels somewhat better today but still cant exactly walk a straight line. Does c/o ongoing fatigue, but denies signficant daytime hypersomnolence. Tried to go back to work today but too unsteady, had to call the wife to pick him up.  Pt denies polydipsia, polyuria,  Denies hyper or hypo thyroid symptoms such as voice, skin or hair change. Past Medical History  Diagnosis Date  . Impaired glucose tolerance   . HYPOGONADISM, MALE   . HYPERLIPIDEMIA   . INSOMNIA-SLEEP DISORDER-UNSPEC   . SLEEP APNEA, OBSTRUCTIVE   . ALLERGIC RHINITIS   . GERD   . OSTEOARTHRITIS   . Unspecified hypothyroidism 12/04/2013   Past Surgical History  Procedure Laterality Date  . Tonsillectomy    . Nasal septum surgery    . Vasectomy      reports that he has never smoked. He does not have any smokeless tobacco history on file. He reports that he does not drink alcohol or use illicit drugs. family history is not on file. Allergies  Allergen Reactions  . Lipitor [Atorvastatin] Other (See Comments)    Leg cramps     Current Outpatient Prescriptions on File Prior to Visit  Medication Sig Dispense Refill  . aspirin EC 81 MG tablet Take 1 tablet (81 mg total) by mouth daily. 90 tablet 11  . levothyroxine (SYNTHROID, LEVOTHROID) 50 MCG tablet Take 1 tablet (50 mcg total) by mouth daily. 90 tablet 3  . meloxicam (MOBIC) 15 MG tablet Take 1 tablet (15 mg total) by mouth daily. 90 tablet 3  . metFORMIN (GLUCOPHAGE XR) 500 MG 24 hr tablet Take 1 tablet (500 mg total) by mouth daily with breakfast. 90 tablet 3   No current facility-administered medications on file prior to visit.   Review of Systems  Constitutional: Negative for unusual diaphoresis or night sweats HENT: Negative for ear swelling or discharge Eyes: Negative for worsening visual haziness  Respiratory: Negative for choking and stridor.   Gastrointestinal: Negative for distension or worsening eructation Genitourinary: Negative for retention or change in urine volume.  Musculoskeletal: Negative for other MSK pain or swelling Skin: Negative for color change and worsening wound Neurological: Negative for tremors and numbness other than noted  Psychiatric/Behavioral: Negative for decreased concentration or agitation other than above       Objective:   Physical Exam BP 122/76 mmHg  Pulse 97  Temp(Src) 98.4 F (36.9 C) (Oral)  Resp 20  Wt 296 lb (134.265 kg)  SpO2 97% VS noted, not ill appearing Constitutional: Pt appears  in no apparent distress HENT: Head: NCAT.  Right Ear: External ear normal.  Left Ear: External ear normal.  Trace left TM erythema Eyes: . Pupils are equal, round, and reactive to light. Conjunctivae and EOM are normal Neck: Normal range of motion. Neck supple.  Cardiovascular: Normal rate and regular rhythm.   Pulmonary/Chest: Effort normal and breath sounds without rales or wheezing.  Abd:  Soft, NT, ND, + BS Neurological: Pt is alert. Not confused , motor 5/5 intact, cb 2-12 intact, gait slightly unsteady but does  not hold walls or use cane Skin: Skin is warm. No rash, no LE edema Psychiatric: Pt behavior is normal. No agitation.     Assessment & Plan:

## 2016-02-01 NOTE — Assessment & Plan Note (Signed)
Lab Results  Component Value Date   TSH 3.58 11/17/2015   For f/u lab today

## 2016-02-01 NOTE — Assessment & Plan Note (Signed)
?   Post infectious vs other, Etiology unclear, Exam otherwise benign, to check labs as documented, follow with expectant management

## 2016-02-06 ENCOUNTER — Telehealth: Payer: Self-pay | Admitting: Internal Medicine

## 2016-02-06 NOTE — Telephone Encounter (Signed)
Patient Name: Connor Becker  DOB: 30-Oct-1950    Initial Comment Caller says was seen last week for dizziness, he has been taking Rx for it. When he walks he needs to hold onto a wall now.    Nurse Assessment  Nurse: Leilani Merl, RN, Heather Date/Time (Eastern Time): 02/06/2016 9:29:39 AM  Confirm and document reason for call. If symptomatic, describe symptoms. You must click the next button to save text entered. ---Caller says was seen last week for dizziness, he has been taking Rx for it. When he walks he needs to hold onto a wall now.  Has the patient traveled out of the country within the last 30 days? ---Not Applicable  Does the patient have any new or worsening symptoms? ---Yes  Will a triage be completed? ---Yes  Related visit to physician within the last 2 weeks? ---Yes  Does the PT have any chronic conditions? (i.e. diabetes, asthma, etc.) ---Yes  List chronic conditions. ---See MR  Is this a behavioral health or substance abuse call? ---No     Guidelines    Guideline Title Affirmed Question Affirmed Notes  Dizziness - Vertigo [1] MODERATE dizziness (e.g., vertigo; feels very unsteady, interferes with normal activities) AND [2] has been evaluated by physician for this    Final Disposition User   See PCP When Office is Open (within 3 days) Standifer, RN, Nira Conn    Comments  Appt made with Dr. Jenny Reichmann tomorrow at 6 pm.  Caller is wondering if there are any tests that can be done or ordered before his appt tomorrow. Please call and let her know.   Referrals  REFERRED TO PCP OFFICE   Disagree/Comply: Comply

## 2016-02-07 ENCOUNTER — Ambulatory Visit (INDEPENDENT_AMBULATORY_CARE_PROVIDER_SITE_OTHER): Payer: Commercial Managed Care - PPO | Admitting: Internal Medicine

## 2016-02-07 ENCOUNTER — Encounter: Payer: Self-pay | Admitting: Internal Medicine

## 2016-02-07 VITALS — BP 130/82 | HR 98 | Temp 98.6°F | Resp 20 | Wt 298.0 lb

## 2016-02-07 DIAGNOSIS — E119 Type 2 diabetes mellitus without complications: Secondary | ICD-10-CM | POA: Diagnosis not present

## 2016-02-07 DIAGNOSIS — R42 Dizziness and giddiness: Secondary | ICD-10-CM

## 2016-02-07 DIAGNOSIS — J018 Other acute sinusitis: Secondary | ICD-10-CM | POA: Diagnosis not present

## 2016-02-07 DIAGNOSIS — J309 Allergic rhinitis, unspecified: Secondary | ICD-10-CM | POA: Diagnosis not present

## 2016-02-07 MED ORDER — LEVOFLOXACIN 250 MG PO TABS: 250.0000 mg | ORAL_TABLET | Freq: Every day | ORAL | 0 refills | Status: AC

## 2016-02-07 NOTE — Progress Notes (Signed)
Pre visit review using our clinic review tool, if applicable. No additional management support is needed unless otherwise documented below in the visit note. 

## 2016-02-07 NOTE — Patient Instructions (Signed)
Please take all new medication as prescribed - the antibiotic  Please continue all other medications as before  Please have the pharmacy call with any other refills you may need.  Please keep your appointments with your specialists as you may have planned  You will be contacted regarding the referral for: Vestibular Rehab  Please call if not improved in 1-2 wks to consider MRI and/or neurology referral

## 2016-02-12 NOTE — Assessment & Plan Note (Signed)
Mild to mod, for antibx course,  to f/u any worsening symptoms or concerns 

## 2016-02-12 NOTE — Assessment & Plan Note (Signed)
Ok to continue current tx, consider predpac,  to f/u any worsening symptoms or concerns

## 2016-02-12 NOTE — Assessment & Plan Note (Signed)
stable overall by history and exam, recent data reviewed with pt, and pt to continue medical treatment as before,  to f/u any worsening symptoms or concerns Lab Results  Component Value Date   HGBA1C 7.0 (H) 11/17/2015

## 2016-02-12 NOTE — Assessment & Plan Note (Signed)
suspect peripheral, for cont'd meclizine prn, also refer vestibiular rehab,  to f/u any worsening symptoms or concerns

## 2016-02-12 NOTE — Progress Notes (Signed)
Subjective:    Patient ID: Connor Becker, male    DOB: 29-Oct-1950, 65 y.o.   MRN: [id number]  HPI  Here after initially better, then worse again with dizziness;  Here with 2-3 days acute onset fever, facial pain, pressure, headache, general weakness and malaise, and greenish d/c, with mild ST and cough, but pt denies chest pain, wheezing, increased sob or doe, orthopnea, PND, increased LE swelling, palpitations, or syncope. This is despite improved use of mucinex, flonase, sudafed, valium and meclizine.  Vertigo has been incapacitating for a couple of recent days, some better today so he could come to visit.  Pt denies new neurological symptoms such as new headache, or facial or extremity weakness or numbness   Pt denies polydipsia, polyuria Past Medical History:  Diagnosis Date  . ALLERGIC RHINITIS   . GERD   . HYPERLIPIDEMIA   . HYPOGONADISM, MALE   . Impaired glucose tolerance   . INSOMNIA-SLEEP DISORDER-UNSPEC   . OSTEOARTHRITIS   . SLEEP APNEA, OBSTRUCTIVE   . Unspecified hypothyroidism 12/04/2013   Past Surgical History:  Procedure Laterality Date  . NASAL SEPTUM SURGERY    . TONSILLECTOMY    . VASECTOMY      reports that he has never smoked. He does not have any smokeless tobacco history on file. He reports that he does not drink alcohol or use drugs. family history is not on file. Allergies  Allergen Reactions  . Lipitor [Atorvastatin] Other (See Comments)    Leg cramps   Current Outpatient Prescriptions on File Prior to Visit  Medication Sig Dispense Refill  . aspirin EC 81 MG tablet Take 1 tablet (81 mg total) by mouth daily. 90 tablet 11  . esomeprazole (NEXIUM) 40 MG capsule Take 1 capsule (40 mg total) by mouth daily. 90 capsule 3  . levothyroxine (SYNTHROID, LEVOTHROID) 88 MCG tablet Take 1 tablet (88 mcg total) by mouth daily. 90 tablet 3  . meloxicam (MOBIC) 15 MG tablet Take 1 tablet (15 mg total) by mouth daily. 90 tablet 3  . metFORMIN (GLUCOPHAGE XR) 500 MG  24 hr tablet Take 1 tablet (500 mg total) by mouth daily with breakfast. 90 tablet 3   No current facility-administered medications on file prior to visit.    Review of Systems  Constitutional: Negative for unusual diaphoresis or night sweats HENT: Negative for ear swelling or discharge Eyes: Negative for worsening visual haziness  Respiratory: Negative for choking and stridor.   Gastrointestinal: Negative for distension or worsening eructation Genitourinary: Negative for retention or change in urine volume.  Musculoskeletal: Negative for other MSK pain or swelling Skin: Negative for color change and worsening wound Neurological: Negative for tremors and numbness other than noted  Psychiatric/Behavioral: Negative for decreased concentration or agitation other than above       Objective:   Physical Exam BP 130/82   Pulse 98   Temp 98.6 F (37 C) (Oral)   Resp 20   Wt 298 lb (135.2 kg)   SpO2 95%   BMI 37.25 kg/m  VS noted,  Constitutional: Pt appears in no apparent distress HENT: Head: NCAT.  Right Ear: External ear normal.  Left Ear: External ear normal.  Bilat tm's with mild erythema.  Max sinus areas mild tender.  Pharynx with mild erythema, no exudate   Eyes: . Pupils are equal, round, and reactive to light. Conjunctivae and EOM are normal Neck: Normal range of motion. Neck supple.  Cardiovascular: Normal rate and regular rhythm.   Pulmonary/Chest:  Effort normal and breath sounds without rales or wheezing.  Neurological: Pt is alert. Not confused , motor grossly intact, FTN intact, no nystagmus Skin: Skin is warm. No rash, no LE edema Psychiatric: Pt behavior is normal. No agitation.      Assessment & Plan:

## 2016-03-09 DIAGNOSIS — J342 Deviated nasal septum: Secondary | ICD-10-CM | POA: Diagnosis not present

## 2016-03-09 DIAGNOSIS — J322 Chronic ethmoidal sinusitis: Secondary | ICD-10-CM | POA: Diagnosis not present

## 2016-03-09 DIAGNOSIS — J040 Acute laryngitis: Secondary | ICD-10-CM | POA: Diagnosis not present

## 2016-03-09 DIAGNOSIS — H8143 Vertigo of central origin, bilateral: Secondary | ICD-10-CM | POA: Diagnosis not present

## 2016-03-09 DIAGNOSIS — J320 Chronic maxillary sinusitis: Secondary | ICD-10-CM | POA: Diagnosis not present

## 2016-03-21 ENCOUNTER — Encounter: Payer: Self-pay | Admitting: Internal Medicine

## 2016-03-21 ENCOUNTER — Telehealth: Payer: Self-pay | Admitting: Internal Medicine

## 2016-03-21 NOTE — Telephone Encounter (Signed)
error 

## 2016-03-22 DIAGNOSIS — J040 Acute laryngitis: Secondary | ICD-10-CM | POA: Diagnosis not present

## 2016-03-22 DIAGNOSIS — J301 Allergic rhinitis due to pollen: Secondary | ICD-10-CM | POA: Diagnosis not present

## 2016-03-22 DIAGNOSIS — R0683 Snoring: Secondary | ICD-10-CM | POA: Diagnosis not present

## 2016-03-22 DIAGNOSIS — G4733 Obstructive sleep apnea (adult) (pediatric): Secondary | ICD-10-CM | POA: Diagnosis not present

## 2016-03-22 DIAGNOSIS — J322 Chronic ethmoidal sinusitis: Secondary | ICD-10-CM | POA: Diagnosis not present

## 2016-03-22 DIAGNOSIS — J320 Chronic maxillary sinusitis: Secondary | ICD-10-CM | POA: Diagnosis not present

## 2016-03-28 DIAGNOSIS — J320 Chronic maxillary sinusitis: Secondary | ICD-10-CM | POA: Diagnosis not present

## 2016-03-28 DIAGNOSIS — H8143 Vertigo of central origin, bilateral: Secondary | ICD-10-CM | POA: Diagnosis not present

## 2016-03-28 DIAGNOSIS — R42 Dizziness and giddiness: Secondary | ICD-10-CM | POA: Diagnosis not present

## 2016-03-28 DIAGNOSIS — H9313 Tinnitus, bilateral: Secondary | ICD-10-CM | POA: Diagnosis not present

## 2016-03-28 DIAGNOSIS — J322 Chronic ethmoidal sinusitis: Secondary | ICD-10-CM | POA: Diagnosis not present

## 2016-03-28 DIAGNOSIS — H903 Sensorineural hearing loss, bilateral: Secondary | ICD-10-CM | POA: Diagnosis not present

## 2016-03-29 ENCOUNTER — Telehealth: Payer: Self-pay | Admitting: Internal Medicine

## 2016-03-29 NOTE — Telephone Encounter (Signed)
Pt's spouse stating Connor Becker has been experiencing dizziness since last ov with Dr. Jenny Becker. ENT doctor recommend getting his A1C check again because this problem might be related. She was wondering if Dr. Jenny Becker will order fast A1C or what Dr. Jenny Becker recommend? Please call her back

## 2016-03-29 NOTE — Telephone Encounter (Signed)
Pt had relatively well controlled a1c just in may 2017  If pt requests, ok for nurse visit for POCT hgba1c to see if is worse

## 2016-03-29 NOTE — Telephone Encounter (Signed)
Pt schedule for Friday at 1030 to also want his BP check. Pt want to ask about rx for BP cuff, medicare should cover it? Please check and let pt know.

## 2016-03-30 ENCOUNTER — Ambulatory Visit (INDEPENDENT_AMBULATORY_CARE_PROVIDER_SITE_OTHER): Payer: Medicare Other

## 2016-03-30 ENCOUNTER — Telehealth: Payer: Self-pay

## 2016-03-30 VITALS — BP 120/72

## 2016-03-30 DIAGNOSIS — R42 Dizziness and giddiness: Secondary | ICD-10-CM

## 2016-03-30 DIAGNOSIS — R739 Hyperglycemia, unspecified: Secondary | ICD-10-CM | POA: Diagnosis not present

## 2016-03-30 LAB — POCT GLYCOSYLATED HEMOGLOBIN (HGB A1C): HEMOGLOBIN A1C: 6.9

## 2016-03-30 NOTE — Telephone Encounter (Signed)
Patient came in nurse visit today----A1C POCT reading is 6.9----bp reading today 120/72-----patient is scheduling appt to see you for follow up and is also requesting script for bp cuff that can be sent to their pharmacy so that their insurance will cover the cost---routing to dr john---please advise, I will call patient back, thanks

## 2016-03-30 NOTE — Telephone Encounter (Signed)
Called pt no answer LMOM rx will be faxed to [facility]...Johny Chess

## 2016-03-30 NOTE — Telephone Encounter (Signed)
I am not aware for the last 20 yrs that an insurance company would cover the cost of the BP monitor.  I have done the rx, please let me know if this actually worked.

## 2016-04-03 ENCOUNTER — Encounter: Payer: Self-pay | Admitting: Internal Medicine

## 2016-04-03 ENCOUNTER — Ambulatory Visit (INDEPENDENT_AMBULATORY_CARE_PROVIDER_SITE_OTHER): Payer: Medicare Other | Admitting: Internal Medicine

## 2016-04-03 VITALS — BP 128/80 | HR 95 | Temp 98.0°F | Resp 20 | Wt 292.0 lb

## 2016-04-03 DIAGNOSIS — E119 Type 2 diabetes mellitus without complications: Secondary | ICD-10-CM

## 2016-04-03 DIAGNOSIS — R42 Dizziness and giddiness: Secondary | ICD-10-CM | POA: Diagnosis not present

## 2016-04-03 DIAGNOSIS — E538 Deficiency of other specified B group vitamins: Secondary | ICD-10-CM

## 2016-04-03 DIAGNOSIS — Z1211 Encounter for screening for malignant neoplasm of colon: Secondary | ICD-10-CM

## 2016-04-03 DIAGNOSIS — E039 Hypothyroidism, unspecified: Secondary | ICD-10-CM

## 2016-04-03 NOTE — Assessment & Plan Note (Signed)
Etiology unclear, suspect peripheral but cant r/o central, for MRI head, cont'd meclizine prn, check b12, TFT;s as above, consider neurology/vestibular rehab (pt declines today)

## 2016-04-03 NOTE — Progress Notes (Signed)
Pre visit review using our clinic review tool, if applicable. No additional management support is needed unless otherwise documented below in the visit note. 

## 2016-04-03 NOTE — Assessment & Plan Note (Signed)
stable overall by history and exam, recent data reviewed with pt, and pt to continue medical treatment as before,  to f/u any worsening symptoms or concerns Lab Results  Component Value Date   TSH 9.51 (H) 02/01/2016   On new increased replacement, for f/u labs today

## 2016-04-03 NOTE — Patient Instructions (Addendum)
Please continue all other medications as before, and refills have been done if requested.  Please have the pharmacy call with any other refills you may need.  Please continue your efforts at being more active, low cholesterol diet, and weight control.  You are otherwise up to date with prevention measures today.  Please keep your appointments with your specialists as you may have planned  You will be contacted regarding the referral for: MRI, and colonoscopy  We can consider Neurology referral if not improving  Please go to the LAB in the Basement (turn left off the elevator) for the tests to be done at your convenience  You will be contacted by phone if any changes need to be made immediately.  Otherwise, you will receive a letter about your results with an explanation, but please check with MyChart first.  Please remember to sign up for MyChart if you have not done so, as this will be important to you in the future with finding out test results, communicating by private email, and scheduling acute appointments online when needed.

## 2016-04-03 NOTE — Assessment & Plan Note (Signed)
stable overall by history and exam, recent data reviewed with pt, and pt to continue medical treatment as before,  to f/u any worsening symptoms or concerns Lab Results  Component Value Date   HGBA1C 6.9 03/30/2016

## 2016-04-03 NOTE — Progress Notes (Signed)
Subjective:    Patient ID: Connor Becker, male    DOB: [birth year], 65 y.o.   MRN: [id number]  HPI   Here to f/u, unfortunately still with recurring dizziness; Has seen ENT s/p 2 rounds antibx, then suggested a glucose tolerance test to r/o glucose fluctuations.  Saw audiology with mild HF sensorineural loss, mild. Denies hyper or hypo thyroid symptoms such as voice, skin or hair change. Pt denies chest pain, increased sob or doe, wheezing, orthopnea, PND, increased LE swelling, palpitations, or syncope.  Pt denies new neurological symptoms such as new headache, or facial or extremity weakness or numbness   Pt denies polydipsia, polyuria, not taking metformin due to GI upset  Also due for colonoscopy, ok to refer Past Medical History:  Diagnosis Date  . ALLERGIC RHINITIS   . GERD   . HYPERLIPIDEMIA   . HYPOGONADISM, MALE   . Impaired glucose tolerance   . INSOMNIA-SLEEP DISORDER-UNSPEC   . OSTEOARTHRITIS   . SLEEP APNEA, OBSTRUCTIVE   . Unspecified hypothyroidism 12/04/2013   Past Surgical History:  Procedure Laterality Date  . NASAL SEPTUM SURGERY    . TONSILLECTOMY    . VASECTOMY      reports that he has never smoked. He does not have any smokeless tobacco history on file. He reports that he does not drink alcohol or use drugs. family history is not on file. Allergies  Allergen Reactions  . Lipitor [Atorvastatin] Other (See Comments)    Leg cramps   Current Outpatient Prescriptions on File Prior to Visit  Medication Sig Dispense Refill  . aspirin EC 81 MG tablet Take 1 tablet (81 mg total) by mouth daily. 90 tablet 11  . esomeprazole (NEXIUM) 40 MG capsule Take 1 capsule (40 mg total) by mouth daily. 90 capsule 3  . levothyroxine (SYNTHROID, LEVOTHROID) 88 MCG tablet Take 1 tablet (88 mcg total) by mouth daily. 90 tablet 3  . meloxicam (MOBIC) 15 MG tablet Take 1 tablet (15 mg total) by mouth daily. 90 tablet 3  . metFORMIN (GLUCOPHAGE XR) 500 MG 24 hr tablet Take 1 tablet  (500 mg total) by mouth daily with breakfast. 90 tablet 3   No current facility-administered medications on file prior to visit.    Review of Systems  Constitutional: Negative for unusual diaphoresis or night sweats HENT: Negative for ear swelling or discharge Eyes: Negative for worsening visual haziness  Respiratory: Negative for choking and stridor.   Gastrointestinal: Negative for distension or worsening eructation Genitourinary: Negative for retention or change in urine volume.  Musculoskeletal: Negative for other MSK pain or swelling Skin: Negative for color change and worsening wound Neurological: Negative for tremors and numbness other than noted  Psychiatric/Behavioral: Negative for decreased concentration or agitation other than above       Objective:   Physical Exam BP 128/80   Pulse 95   Temp 98 F (36.7 C) (Oral)   Resp 20   Wt 292 lb (132.5 kg)   SpO2 96%   BMI 36.50 kg/m  VS noted, not ill appaering Constitutional: Pt appears in no apparent distress HENT: Head: NCAT.  Right Ear: External ear normal.  Left Ear: External ear normal.  Eyes: . Pupils are equal, round, and reactive to light. Conjunctivae and EOM are normal Neck: Normal range of motion. Neck supple.  Cardiovascular: Normal rate and regular rhythm.   Pulmonary/Chest: Effort normal and breath sounds without rales or wheezing.  Neurological: Pt is alert. Not confused , motor 5/5 intact, cn  2-12 intact Skin: Skin is warm. No rash, no LE edema Psychiatric: Pt behavior is normal. No agitation.     Assessment & Plan:

## 2016-04-06 ENCOUNTER — Other Ambulatory Visit (INDEPENDENT_AMBULATORY_CARE_PROVIDER_SITE_OTHER): Payer: Medicare Other

## 2016-04-06 DIAGNOSIS — H401322 Pigmentary glaucoma, left eye, moderate stage: Secondary | ICD-10-CM | POA: Diagnosis not present

## 2016-04-06 DIAGNOSIS — E538 Deficiency of other specified B group vitamins: Secondary | ICD-10-CM

## 2016-04-06 DIAGNOSIS — H2513 Age-related nuclear cataract, bilateral: Secondary | ICD-10-CM | POA: Diagnosis not present

## 2016-04-06 DIAGNOSIS — H524 Presbyopia: Secondary | ICD-10-CM | POA: Diagnosis not present

## 2016-04-06 DIAGNOSIS — E119 Type 2 diabetes mellitus without complications: Secondary | ICD-10-CM

## 2016-04-06 DIAGNOSIS — E039 Hypothyroidism, unspecified: Secondary | ICD-10-CM | POA: Diagnosis not present

## 2016-04-06 DIAGNOSIS — H5213 Myopia, bilateral: Secondary | ICD-10-CM | POA: Diagnosis not present

## 2016-04-06 DIAGNOSIS — I69998 Other sequelae following unspecified cerebrovascular disease: Secondary | ICD-10-CM | POA: Diagnosis not present

## 2016-04-06 DIAGNOSIS — H52223 Regular astigmatism, bilateral: Secondary | ICD-10-CM | POA: Diagnosis not present

## 2016-04-06 DIAGNOSIS — H401313 Pigmentary glaucoma, right eye, severe stage: Secondary | ICD-10-CM | POA: Diagnosis not present

## 2016-04-06 DIAGNOSIS — R42 Dizziness and giddiness: Secondary | ICD-10-CM | POA: Diagnosis not present

## 2016-04-06 DIAGNOSIS — H47233 Glaucomatous optic atrophy, bilateral: Secondary | ICD-10-CM | POA: Diagnosis not present

## 2016-04-06 LAB — HEPATIC FUNCTION PANEL
ALT: 24 U/L (ref 0–53)
AST: 21 U/L (ref 0–37)
Albumin: 3.8 g/dL (ref 3.5–5.2)
Alkaline Phosphatase: 61 U/L (ref 39–117)
BILIRUBIN DIRECT: 0 mg/dL (ref 0.0–0.3)
BILIRUBIN TOTAL: 0.5 mg/dL (ref 0.2–1.2)
TOTAL PROTEIN: 6.9 g/dL (ref 6.0–8.3)

## 2016-04-06 LAB — CBC WITH DIFFERENTIAL/PLATELET
BASOS PCT: 0.8 % (ref 0.0–3.0)
Basophils Absolute: 0.1 10*3/uL (ref 0.0–0.1)
Eosinophils Absolute: 0.3 10*3/uL (ref 0.0–0.7)
Eosinophils Relative: 3.2 % (ref 0.0–5.0)
HEMATOCRIT: 43.5 % (ref 39.0–52.0)
HEMOGLOBIN: 15.1 g/dL (ref 13.0–17.0)
LYMPHS PCT: 28.7 % (ref 12.0–46.0)
Lymphs Abs: 2.7 10*3/uL (ref 0.7–4.0)
MCHC: 34.8 g/dL (ref 30.0–36.0)
MCV: 88.6 fl (ref 78.0–100.0)
MONOS PCT: 9.5 % (ref 3.0–12.0)
Monocytes Absolute: 0.9 10*3/uL (ref 0.1–1.0)
NEUTROS ABS: 5.5 10*3/uL (ref 1.4–7.7)
Neutrophils Relative %: 57.8 % (ref 43.0–77.0)
PLATELETS: 273 10*3/uL (ref 150.0–400.0)
RBC: 4.91 Mil/uL (ref 4.22–5.81)
RDW: 13.7 % (ref 11.5–15.5)
WBC: 9.5 10*3/uL (ref 4.0–10.5)

## 2016-04-06 LAB — T4, FREE: Free T4: 0.96 ng/dL (ref 0.60–1.60)

## 2016-04-06 LAB — BASIC METABOLIC PANEL
BUN: 15 mg/dL (ref 6–23)
CALCIUM: 8.5 mg/dL (ref 8.4–10.5)
CO2: 31 mEq/L (ref 19–32)
CREATININE: 1.03 mg/dL (ref 0.40–1.50)
Chloride: 103 mEq/L (ref 96–112)
GFR: 76.52 mL/min (ref >60.00)
Glucose, Bld: 120 mg/dL — ABNORMAL HIGH (ref 70–99)
Potassium: 4.1 mEq/L (ref 3.5–5.1)
Sodium: 139 mEq/L (ref 135–145)

## 2016-04-06 LAB — VITAMIN B12: VITAMIN B 12: 455 pg/mL (ref 211–911)

## 2016-04-06 LAB — TSH: TSH: 3.17 u[IU]/mL (ref 0.35–4.50)

## 2016-04-13 ENCOUNTER — Other Ambulatory Visit: Payer: Medicare Other

## 2016-04-13 DIAGNOSIS — E039 Hypothyroidism, unspecified: Secondary | ICD-10-CM | POA: Diagnosis not present

## 2016-04-13 DIAGNOSIS — R42 Dizziness and giddiness: Secondary | ICD-10-CM | POA: Diagnosis not present

## 2016-04-13 DIAGNOSIS — E119 Type 2 diabetes mellitus without complications: Secondary | ICD-10-CM | POA: Diagnosis not present

## 2016-04-16 LAB — GLUCOSE, FASTING: Glucose, Fasting: 101 mg/dL — ABNORMAL HIGH (ref 65–99)

## 2016-04-22 ENCOUNTER — Ambulatory Visit
Admission: RE | Admit: 2016-04-22 | Discharge: 2016-04-22 | Disposition: A | Discharge status: Home / Self Care | Payer: Medicare Other | Source: Ambulatory Visit | Attending: Internal Medicine | Admitting: Internal Medicine

## 2016-04-22 DIAGNOSIS — R42 Dizziness and giddiness: Secondary | ICD-10-CM

## 2016-04-22 DIAGNOSIS — E119 Type 2 diabetes mellitus without complications: Secondary | ICD-10-CM

## 2016-04-22 DIAGNOSIS — E039 Hypothyroidism, unspecified: Secondary | ICD-10-CM

## 2016-04-26 ENCOUNTER — Other Ambulatory Visit: Payer: Medicare Other

## 2016-05-09 DIAGNOSIS — E039 Hypothyroidism, unspecified: Secondary | ICD-10-CM | POA: Diagnosis not present

## 2016-05-09 DIAGNOSIS — R42 Dizziness and giddiness: Secondary | ICD-10-CM | POA: Diagnosis not present

## 2016-05-09 DIAGNOSIS — R7309 Other abnormal glucose: Secondary | ICD-10-CM | POA: Diagnosis not present

## 2016-05-09 DIAGNOSIS — R5383 Other fatigue: Secondary | ICD-10-CM | POA: Diagnosis not present

## 2016-05-09 DIAGNOSIS — R0982 Postnasal drip: Secondary | ICD-10-CM | POA: Diagnosis not present

## 2016-05-17 ENCOUNTER — Encounter: Payer: Self-pay | Admitting: Internal Medicine

## 2016-05-29 ENCOUNTER — Encounter: Payer: Self-pay | Admitting: Gastroenterology

## 2016-06-05 ENCOUNTER — Encounter: Payer: Medicare Other | Attending: Internal Medicine | Admitting: Skilled Nursing Facility1

## 2016-06-05 ENCOUNTER — Encounter: Payer: Self-pay | Admitting: Skilled Nursing Facility1

## 2016-06-05 DIAGNOSIS — Z713 Dietary counseling and surveillance: Secondary | ICD-10-CM | POA: Insufficient documentation

## 2016-06-05 DIAGNOSIS — E119 Type 2 diabetes mellitus without complications: Secondary | ICD-10-CM | POA: Diagnosis not present

## 2016-06-05 NOTE — Progress Notes (Signed)
Patient was seen on 06/05/2016 for the first of a series of three diabetes self-management courses at the [facility].  Patient Education Plan per assessed needs and concerns is to attend four course education program for Diabetes Self Management Education.  The following learning objectives were met by the patient during this class:  Describe diabetes  State some common risk factors for diabetes  Defines the role of glucose and insulin  Identifies type of diabetes and pathophysiology  Describe the relationship between diabetes and cardiovascular risk  State the members of the Healthcare Team  States the rationale for glucose monitoring  State when to test glucose  State their individual Target Range  State the importance of logging glucose readings  Describe how to interpret glucose readings  Identifies A1C target  Explain the correlation between A1c and eAG values  State symptoms and treatment of high blood glucose  State symptoms and treatment of low blood glucose  Explain proper technique for glucose testing  Identifies proper sharps disposal  Handouts given during class include:  Living Well with Diabetes book  Carb Counting and Meal Planning book  Meal Plan Card  Carbohydrate guide  Meal planning worksheet  Low Sodium Flavoring Tips  The diabetes portion plate  P2R to eAG Conversion Chart  Diabetes Medications  Diabetes Recommended Care Schedule  Support Group  Diabetes Success Plan  Core Class Satisfaction Survey  Follow-Up Plan:  Attend core 2

## 2016-06-12 ENCOUNTER — Encounter: Payer: Medicare Other | Admitting: Dietician

## 2016-06-12 DIAGNOSIS — Z713 Dietary counseling and surveillance: Secondary | ICD-10-CM | POA: Diagnosis not present

## 2016-06-12 DIAGNOSIS — E119 Type 2 diabetes mellitus without complications: Secondary | ICD-10-CM | POA: Diagnosis not present

## 2016-06-12 DIAGNOSIS — R42 Dizziness and giddiness: Secondary | ICD-10-CM | POA: Diagnosis not present

## 2016-06-12 DIAGNOSIS — E039 Hypothyroidism, unspecified: Secondary | ICD-10-CM | POA: Diagnosis not present

## 2016-06-12 DIAGNOSIS — I951 Orthostatic hypotension: Secondary | ICD-10-CM | POA: Diagnosis not present

## 2016-06-12 DIAGNOSIS — R079 Chest pain, unspecified: Secondary | ICD-10-CM | POA: Diagnosis not present

## 2016-06-12 DIAGNOSIS — E1165 Type 2 diabetes mellitus with hyperglycemia: Secondary | ICD-10-CM | POA: Diagnosis not present

## 2016-06-12 NOTE — Progress Notes (Signed)

## 2016-06-14 ENCOUNTER — Telehealth: Payer: Self-pay

## 2016-06-14 NOTE — Telephone Encounter (Signed)
SENT NOTES TO SCHEDULING 

## 2016-06-15 DIAGNOSIS — H8110 Benign paroxysmal vertigo, unspecified ear: Secondary | ICD-10-CM | POA: Diagnosis not present

## 2016-06-18 DIAGNOSIS — H8110 Benign paroxysmal vertigo, unspecified ear: Secondary | ICD-10-CM | POA: Diagnosis not present

## 2016-06-19 ENCOUNTER — Ambulatory Visit: Payer: Medicare Other | Admitting: Skilled Nursing Facility1

## 2016-06-20 ENCOUNTER — Encounter: Payer: Self-pay | Admitting: Internal Medicine

## 2016-06-20 ENCOUNTER — Ambulatory Visit: Payer: Medicare Other | Admitting: Physician Assistant

## 2016-06-21 ENCOUNTER — Encounter: Payer: Self-pay | Admitting: Internal Medicine

## 2016-06-22 ENCOUNTER — Ambulatory Visit (INDEPENDENT_AMBULATORY_CARE_PROVIDER_SITE_OTHER): Payer: Medicare Other | Admitting: Physician Assistant

## 2016-06-22 ENCOUNTER — Encounter: Payer: Self-pay | Admitting: Physician Assistant

## 2016-06-22 VITALS — BP 110/88 | HR 77 | Ht 75.0 in | Wt 290.4 lb

## 2016-06-22 DIAGNOSIS — R0789 Other chest pain: Secondary | ICD-10-CM | POA: Diagnosis not present

## 2016-06-22 DIAGNOSIS — R42 Dizziness and giddiness: Secondary | ICD-10-CM | POA: Diagnosis not present

## 2016-06-22 DIAGNOSIS — E119 Type 2 diabetes mellitus without complications: Secondary | ICD-10-CM | POA: Diagnosis not present

## 2016-06-22 NOTE — Progress Notes (Signed)
Cardiology Office Note:    Date:  06/22/2016   ID:  Connor Becker, DOB [birth year], MRN [id number]  PCP:  Connor Greenland, MD  Cardiologist:  New - seen by Connor Dopp, PA-C/Dr. Daneen Becker today   Electrophysiologist:  n/a  Referring MD: Connor Borg, MD   Chief Complaint  Patient presents with  . New Patient (Initial Visit)    dizziness and chest pain   History of Present Illness:    Connor Becker is a 65 y.o. male with a hx of DM, HL, OSA, obesity.  He is referred by his PCP for evaluation of chest pain.  He has had issues with dizziness for several months now.  He mainly describes dizziness with positional changes described as spinning.  He had 2 rounds of antibiotics with his PCP and an ENT.  When seen by his PCP 06/12/16, orthostatic vital signs demonstrated blood pressure drop of 98/56 lying to 80/66 standing (pulse 84 >>98). TSH 4.08, BUN 17, creatinine 1.12, potassium 4.2. ECG from PCP dated 05/09/16: NSR, leftward axis, HR 86, QTc 391, no significant ST-T wave changes.  He is now seeing PT for vestibular rehabilitation.  He is feeling somewhat better.  He is also trying to improve his hydration.  He still has some dizziness with standing.  He notes occasional episodes of chest pain that is L sided and occurs more with positional changes.  He notes it while working in his shop.  He denies chest pain with walking fast or going up steps.  He has dyspnea on exertion with more mod to extreme activities.  He denies orthopnea, PND.  He notes dependent edema.  He denies syncope or near syncope.    PAD Screen 06/22/2016  Previous PAD dx? No  Previous surgical procedure? No  Pain with walking? No  Feet/toe relief with dangling? No  Painful, non-healing ulcers? No  Extremities discolored? No    Prior CV studies that were reviewed today include:    None   Past Medical History:  Diagnosis Date  . ALLERGIC RHINITIS   . DM2 (diabetes mellitus, type 2) ([address])   . GERD   .  HYPERLIPIDEMIA    myalgias from Lipitor  . HYPOGONADISM, MALE   . INSOMNIA-SLEEP DISORDER-UNSPEC   . OSTEOARTHRITIS   . SLEEP APNEA, OBSTRUCTIVE    no CPAP; repeat sleep test pending 07/2016  . Unspecified hypothyroidism 12/04/2013    Past Surgical History:  Procedure Laterality Date  . NASAL SEPTUM SURGERY    . TONSILLECTOMY    . VASECTOMY      Current Medications: Current Meds  Medication Sig  . aspirin EC 81 MG tablet Take 1 tablet (81 mg total) by mouth daily.  Marland Kitchen esomeprazole (NEXIUM) 40 MG capsule Take 1 capsule (40 mg total) by mouth daily.  Marland Kitchen levothyroxine (SYNTHROID, LEVOTHROID) 100 MCG tablet Take 100 mcg by mouth daily before breakfast.  . meloxicam (MOBIC) 15 MG tablet Take 1 tablet (15 mg total) by mouth daily.  . [DISCONTINUED] levothyroxine (SYNTHROID, LEVOTHROID) 88 MCG tablet Take 1 tablet (88 mcg total) by mouth daily.     Allergies:   Lipitor [atorvastatin] and Metformin and related   Social History   Social History  . Marital status: Married    Spouse name: N/A  . Number of children: N/A  . Years of education: N/A   Social History Main Topics  . Smoking status: Never Smoker  . Smokeless tobacco: Never Used  . Alcohol use No  . Drug  use: No  . Sexual activity: Not Asked   Other Topics Concern  . 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     Family History:  The patient's family history includes Diabetes in his maternal uncle and mother; Heart attack in his maternal uncle; Heart attack (age of onset: 29) in his mother; Other in his mother.   ROS:   Please see the history of present illness.    Review of Systems  Respiratory: Positive for snoring.   Musculoskeletal: Positive for joint swelling.  Neurological: Positive for dizziness and loss of balance.   All other systems reviewed and are  negative.   EKGs/Labs/Other Test Reviewed:    EKG:  EKG is  ordered today.  The ekg ordered today demonstrates NSR, HR 77, normal axis, QTc 418 ms, no significant ST-TW changes.   Recent Labs: 04/06/2016: ALT 24; BUN 15; Creatinine, Ser 1.03; Hemoglobin 15.1; Platelets 273.0; Potassium 4.1; Sodium 139; TSH 3.17   Recent Lipid Panel    Component Value Date/Time   CHOL 208 (H) 11/17/2015 1428   TRIG 300.0 (H) 11/17/2015 1428   HDL 43.70 11/17/2015 1428   CHOLHDL 5 11/17/2015 1428   VLDL 60.0 (H) 11/17/2015 1428   LDLCALC 131 (H) 12/04/2013 0904   LDLDIRECT 148.0 11/17/2015 1428     Physical Exam:    VS:  BP 110/88   Pulse 77   Ht 6\' 3"  (1.905 m)   Wt 290 lb 6.4 oz (131.7 kg)   BMI 36.30 kg/m     Wt Readings from Last 3 Encounters:  06/22/16 290 lb 6.4 oz (131.7 kg)  06/05/16 290 lb 12.8 oz (131.9 kg)  04/03/16 292 lb (132.5 kg)     Physical Exam  Constitutional: He is oriented to person, place, and time. He appears well-developed and well-nourished. No distress.  HENT:  Head: Normocephalic and atraumatic.  Eyes: No scleral icterus.  Neck: No JVD present. Carotid bruit is not present.  Cardiovascular: Normal rate, regular rhythm, S1 normal and S2 normal.  PMI is not displaced.   No murmur heard. Pulses:      Dorsalis pedis pulses are 2+ on the right side, and 2+ on the left side.       Posterior tibial pulses are 2+ on the right side, and 2+ on the left side.  Pulmonary/Chest: Effort normal. He has no wheezes. He has no rales.  Abdominal: Soft. There is no tenderness.  Musculoskeletal: He exhibits no edema.  Neurological: He is alert and oriented to person, place, and time.  Skin: Skin is warm and dry.  Psychiatric: He has a normal mood and affect.    ASSESSMENT:    1. Other chest pain   2. Dizziness   3. Type 2 diabetes mellitus without complication, without long-term current use of insulin ([place])   4. Morbid obesity ([place])    PLAN:    In order of problems  listed above:  1. Chest pain - His chest pain is more likely MSK than anything else.  However, he has a lot of risk factors including DM and FHx of CAD.  His diet is rich in saturated fats and simple CHOs.  I will set him up for a exercise nuclear stress test.  If this is normal, he can follow up with Cardiology prn.  2. Dizziness -  This is mostly related to vertigo. He also has an element of orthostasis. He will continue follow-up with physical therapy and his primary care physician.  3. Diabetes - Continue follow-up with primary care for primary prevention of cardiovascular disease. Continue aspirin. Consider an alternate statin in the future.  4. Obesity - He has seen a nutritionist. We discussed the importance of diet and exercise in terms of long-term cardiovascular health.   Medication Adjustments/Labs and Tests Ordered: Current medicines are reviewed at length with the patient today.  Concerns regarding medicines are outlined above.  Medication changes, Labs and Tests ordered today are outlined in the Patient Instructions noted below. Patient Instructions  Medication Instructions:  Your physician recommends that you continue on your current medications as directed. Please refer to the Current Medication list given to you today.  Labwork: NONE  Testing/Procedures: Your physician has requested that you have en exercise stress myoview. For further information please visit [email or web address]. Please follow instruction sheet, as given.  Follow-Up: DR. Tamala Julian AS NEEDED  Any Other Special Instructions Will Be Listed Below (If Applicable).  If you need a refill on your cardiac medications before your next appointment, please call your pharmacy.  Signed, Connor Dopp, PA-C  06/22/2016 10:35 AM    [facility] [place], [place], [place]  [address] Phone: [phone]; Fax: [phone]   The patient has been seen in conjunction with Connor Dopp, PA-C.  All aspects of care have been considered and discussed. The patient has been personally interviewed, examined, and all clinical data has been reviewed.   The patient has atypical chest pain and dizziness. It is unclear if there is any significant cardiac issue. Given his risk factor profile, we will perform a stress myocardial perfusion study to sloughed the possibility of CAD.

## 2016-06-22 NOTE — Patient Instructions (Addendum)
Medication Instructions:  Your physician recommends that you continue on your current medications as directed. Please refer to the Current Medication list given to you today.  Labwork: NONE  Testing/Procedures: Your physician has requested that you have en exercise stress myoview. For further information please visit [email or web address]. Please follow instruction sheet, as given.  Follow-Up: DR. Tamala Julian AS NEEDED  Any Other Special Instructions Will Be Listed Below (If Applicable).  If you need a refill on your cardiac medications before your next appointment, please call your pharmacy.

## 2016-06-25 ENCOUNTER — Ambulatory Visit (AMBULATORY_SURGERY_CENTER): Payer: Self-pay

## 2016-06-25 VITALS — Ht 72.5 in | Wt 289.2 lb

## 2016-06-25 DIAGNOSIS — Z860100 Personal history of colon polyps, unspecified: Secondary | ICD-10-CM

## 2016-06-25 MED ORDER — SUPREP BOWEL PREP KIT 17.5-3.13-1.6 GM/177ML PO SOLN
1.0000 | Freq: Once | ORAL | 0 refills | Status: AC
Start: 2016-06-25 — End: 2016-06-25

## 2016-06-25 NOTE — Progress Notes (Signed)
No allergies to eggs or soy No past problems with anesthesia No diet meds No home oxygen  declinedemmi

## 2016-06-26 ENCOUNTER — Telehealth: Payer: Self-pay

## 2016-06-26 NOTE — Telephone Encounter (Signed)
-----   Message from Ladene Artist, MD sent at 06/26/2016  9:41 AM EST ----- Regarding: RE: sched for colon Please wait for results of cardiology plans after stress test is completed before proceeding with colonoscopy. Needs cardiology clearance.    ----- Message ----- From: Marlon Pel, RN Sent: 06/26/2016   8:22 AM To: Ladene Artist, MD Subject: FW: sched for colon                            Please review cardiology notes and questions from Amy.  He has stress test prior to colon, ----- Message ----- From: [place]: 06/26/2016   8:10 AM To: Marlon Pel, RN Subject: sched for colon                                Good Morning Camyra Vaeth, This patient was seen for previsit yesterday.  Looks like he had c/o chest pain, and is currently being evaluated by cardiology.  I know this is a Dr. Fuller Plan pt, but for Dr. Carlean Purl, I know he wants them cleared before procedure and usually has them cancel until cleared by cardiology.  Do you think that will be done in time or do you think something was missed at previsit? If hes good to go, ill process benefits. Thanks a lot! Amy

## 2016-06-26 NOTE — Telephone Encounter (Signed)
Patient and wife notified of need for clearance.  I rescheduled him to 07/11/16 at 7:30.  This will give him a few extra days to be cleared by cardiology.  They will call back once they have the results from stress test.

## 2016-06-27 DIAGNOSIS — H8110 Benign paroxysmal vertigo, unspecified ear: Secondary | ICD-10-CM | POA: Diagnosis not present

## 2016-06-28 ENCOUNTER — Telehealth (HOSPITAL_COMMUNITY): Payer: Self-pay | Admitting: *Deleted

## 2016-06-28 NOTE — Telephone Encounter (Signed)
Left message on voicemail in reference to upcoming appointment scheduled for 07/02/16 Phone number given for a call back so details instructions can be given. Connor Becker

## 2016-06-29 DIAGNOSIS — H8110 Benign paroxysmal vertigo, unspecified ear: Secondary | ICD-10-CM | POA: Diagnosis not present

## 2016-07-02 ENCOUNTER — Ambulatory Visit (HOSPITAL_COMMUNITY): Payer: Medicare Other | Attending: Internal Medicine

## 2016-07-02 DIAGNOSIS — E785 Hyperlipidemia, unspecified: Secondary | ICD-10-CM | POA: Diagnosis not present

## 2016-07-02 DIAGNOSIS — I10 Essential (primary) hypertension: Secondary | ICD-10-CM | POA: Insufficient documentation

## 2016-07-02 DIAGNOSIS — R079 Chest pain, unspecified: Secondary | ICD-10-CM | POA: Diagnosis not present

## 2016-07-02 DIAGNOSIS — R0789 Other chest pain: Secondary | ICD-10-CM | POA: Diagnosis not present

## 2016-07-02 MED ORDER — TECHNETIUM TC 99M TETROFOSMIN IV KIT
32.9000 | PACK | Freq: Once | INTRAVENOUS | Status: AC | PRN
  Administered 2016-07-02: 32.9 via INTRAVENOUS
  Filled 2016-07-02: qty 33

## 2016-07-03 ENCOUNTER — Encounter: Payer: Self-pay | Admitting: Physician Assistant

## 2016-07-03 ENCOUNTER — Ambulatory Visit (HOSPITAL_COMMUNITY): Payer: Medicare Other | Attending: Cardiovascular Disease

## 2016-07-03 ENCOUNTER — Telehealth: Payer: Self-pay | Admitting: *Deleted

## 2016-07-03 LAB — MYOCARDIAL PERFUSION IMAGING
CHL CUP MPHR: 153 {beats}/min
CHL CUP NUCLEAR SSS: 3
CSEPED: 5 min
CSEPHR: 98 %
CSEPPHR: 150 {beats}/min
Estimated workload: 7 METS
Exercise duration (sec): 0 s
LHR: 0.36
LV sys vol: 41 mL
LVDIAVOL: 88 mL (ref 62–150)
RPE: 18
Rest HR: 77 {beats}/min
SDS: 3
SRS: 0
TID: 0.83

## 2016-07-03 MED ORDER — TECHNETIUM TC 99M TETROFOSMIN IV KIT
32.4000 | PACK | Freq: Once | INTRAVENOUS | Status: AC | PRN
  Administered 2016-07-03: 32.4 via INTRAVENOUS
  Filled 2016-07-03: qty 33

## 2016-07-03 NOTE — Telephone Encounter (Signed)
DPR ok to s/w pt's wife. Pt's wife notified of Myoview results by phone with verbal understanding.

## 2016-07-04 ENCOUNTER — Encounter: Payer: Medicare Other | Admitting: Gastroenterology

## 2016-07-04 DIAGNOSIS — H8110 Benign paroxysmal vertigo, unspecified ear: Secondary | ICD-10-CM | POA: Diagnosis not present

## 2016-07-04 NOTE — Telephone Encounter (Signed)
OK for colonoscopy.  

## 2016-07-04 NOTE — Telephone Encounter (Signed)
Patient notified to proceed with Colonoscopy as planned. Patient verbalized understanding.

## 2016-07-04 NOTE — Telephone Encounter (Signed)
Patient was notified that stress test results were normal. See imaging tab for stress test Dr. Fuller Plan.

## 2016-07-06 ENCOUNTER — Telehealth: Payer: Self-pay | Admitting: Gastroenterology

## 2016-07-06 MED ORDER — NA SULFATE-K SULFATE-MG SULF 17.5-3.13-1.6 GM/177ML PO SOLN: 1.0000 | Freq: Once | ORAL | 0 refills | Status: AC

## 2016-07-06 NOTE — Telephone Encounter (Signed)
Called pt to inform prep sent to [facility]

## 2016-07-10 ENCOUNTER — Telehealth: Payer: Self-pay | Admitting: Gastroenterology

## 2016-07-10 NOTE — Telephone Encounter (Signed)
I did instruct the pt per E. Harvell as she stated

## 2016-07-11 ENCOUNTER — Encounter: Payer: Self-pay | Admitting: Gastroenterology

## 2016-07-11 ENCOUNTER — Ambulatory Visit (AMBULATORY_SURGERY_CENTER): Payer: Medicare Other | Admitting: Gastroenterology

## 2016-07-11 VITALS — BP 108/72 | HR 70 | Temp 95.3°F | Resp 13 | Ht 72.5 in | Wt 289.0 lb

## 2016-07-11 DIAGNOSIS — D123 Benign neoplasm of transverse colon: Secondary | ICD-10-CM

## 2016-07-11 DIAGNOSIS — Z86010 Personal history of colonic polyps: Secondary | ICD-10-CM

## 2016-07-11 DIAGNOSIS — E669 Obesity, unspecified: Secondary | ICD-10-CM | POA: Diagnosis not present

## 2016-07-11 DIAGNOSIS — E119 Type 2 diabetes mellitus without complications: Secondary | ICD-10-CM | POA: Diagnosis not present

## 2016-07-11 DIAGNOSIS — K635 Polyp of colon: Secondary | ICD-10-CM | POA: Diagnosis not present

## 2016-07-11 DIAGNOSIS — G4733 Obstructive sleep apnea (adult) (pediatric): Secondary | ICD-10-CM | POA: Diagnosis not present

## 2016-07-11 DIAGNOSIS — Z860101 Personal history of adenomatous and serrated colon polyps: Secondary | ICD-10-CM

## 2016-07-11 DIAGNOSIS — E039 Hypothyroidism, unspecified: Secondary | ICD-10-CM | POA: Diagnosis not present

## 2016-07-11 MED ORDER — SODIUM CHLORIDE 0.9 % IV SOLN: 500.0000 mL | INTRAVENOUS | Status: DC

## 2016-07-11 NOTE — Progress Notes (Signed)
To recovery, report to [place], RN, VSS 

## 2016-07-11 NOTE — Progress Notes (Signed)
Called to room to assist during endoscopic procedure.  Patient ID and intended procedure confirmed with present staff. Received instructions for my participation in the procedure from the performing physician.  

## 2016-07-11 NOTE — Patient Instructions (Signed)
YOU HAD AN ENDOSCOPIC PROCEDURE TODAY AT THE [facility]:   Refer to the procedure report that was given to you for any specific questions about what was found during the examination.  If the procedure report does not answer your questions, please call your gastroenterologist to clarify.  If you requested that your care partner not be given the details of your procedure findings, then the procedure report has been included in a sealed envelope for you to review at your convenience later.  YOU SHOULD EXPECT: Some feelings of bloating in the abdomen. Passage of more gas than usual.  Walking can help get rid of the air that was put into your GI tract during the procedure and reduce the bloating. If you had a lower endoscopy (such as a colonoscopy or flexible sigmoidoscopy) you may notice spotting of blood in your stool or on the toilet paper. If you underwent a bowel prep for your procedure, you may not have a normal bowel movement for a few days.  Please Note:  You might notice some irritation and congestion in your nose or some drainage.  This is from the oxygen used during your procedure.  There is no need for concern and it should clear up in a day or so.  SYMPTOMS TO REPORT IMMEDIATELY:   Following lower endoscopy (colonoscopy or flexible sigmoidoscopy):  Excessive amounts of blood in the stool  Significant tenderness or worsening of abdominal pains  Swelling of the abdomen that is new, acute  Fever of 100F or higher    For urgent or emergent issues, a gastroenterologist can be reached at any hour by calling [phone].   DIET:  We do recommend a small meal at first, but then you may proceed to your regular diet.  Drink plenty of fluids but you should avoid alcoholic beverages for 24 hours.  ACTIVITY:  You should plan to take it easy for the rest of today and you should NOT DRIVE or use heavy machinery until tomorrow (because of the sedation medicines used during the test).     FOLLOW UP: Our staff will call the number listed on your records the next business day following your procedure to check on you and address any questions or concerns that you may have regarding the information given to you following your procedure. If we do not reach you, we will leave a message.  However, if you are feeling well and you are not experiencing any problems, there is no need to return our call.  We will assume that you have returned to your regular daily activities without incident.  If any biopsies were taken you will be contacted by phone or by letter within the next 1-3 weeks.  Please call us at [phone] if you have not heard about the biopsies in 3 weeks.    SIGNATURES/CONFIDENTIALITY: You and/or your care partner have signed paperwork which will be entered into your electronic medical record.  These signatures attest to the fact that that the information above on your After Visit Summary has been reviewed and is understood.  Full responsibility of the confidentiality of this discharge information lies with you and/or your care-partner.    Information on polyps ,diverticulosis,and hemorrhoids given to you today 

## 2016-07-11 NOTE — Op Note (Signed)
[facility] Patient Name: Connor Becker Procedure Date: 07/11/2016 7:55 AM MRN: [id number] Endoscopist: Ladene Artist , MD Age: 65 Referring MD:  Date of Birth: [birth year] Gender: Male Account #: [id number] Procedure:                Colonoscopy Indications:              Surveillance: Personal history of colonic polyps                            (unknown histology) on last colonoscopy more than 5                            years ago Medicines:                Monitored Anesthesia Care Procedure:                Pre-Anesthesia Assessment:                           - Prior to the procedure, a History and Physical                            was performed, and patient medications and                            allergies were reviewed. The patient's tolerance of                            previous anesthesia was also reviewed. The risks                            and benefits of the procedure and the sedation                            options and risks were discussed with the patient.                            All questions were answered, and informed consent                            was obtained. Prior Anticoagulants: The patient has                            taken no previous anticoagulant or antiplatelet                            agents. ASA Grade Assessment: II - A patient with                            mild systemic disease. After reviewing the risks                            and benefits, the patient was deemed in  satisfactory condition to undergo the procedure.                           After obtaining informed consent, the colonoscope                            was passed under direct vision. Throughout the                            procedure, the patient's blood pressure, pulse, and                            oxygen saturations were monitored continuously. The                            Model PCF-[id number]) scope was  introduced                            through the anus and advanced to the the cecum,                            identified by appendiceal orifice and ileocecal                            valve. The ileocecal valve, appendiceal orifice,                            and rectum were photographed. The quality of the                            bowel preparation was good. The colonoscopy was                            performed without difficulty. The patient tolerated                            the procedure well. Scope In: 8:38:52 AM Scope Out: 8:48:19 AM Scope Withdrawal Time: 0 hours 8 minutes 15 seconds  Total Procedure Duration: 0 hours 9 minutes 27 seconds  Findings:                 The perianal and digital rectal examinations were                            normal.                           A 6 mm polyp was found in the transverse colon. The                            polyp was sessile. The polyp was removed with a                            cold snare. Resection and retrieval were complete.  Internal hemorrhoids were found during                            retroflexion. The hemorrhoids were small and Grade                            I (internal hemorrhoids that do not prolapse).                           A few small-mouthed diverticula were found in the                            transverse colon.                           Many medium-mouthed diverticula were found in the                            sigmoid colon and descending colon.                           The exam was otherwise without abnormality on                            direct and retroflexion views.                           There was a medium-sized lipoma, 20 mm in diameter,                            in the proximal transverse colon. Complications:            No immediate complications. Estimated blood loss:                            None. Estimated Blood Loss:     Estimated blood loss:  none. Impression:               - One 6 mm polyp in the transverse colon, removed                            with a cold snare. Resected and retrieved.                           - Internal hemorrhoids.                           - Diverticulosis in the transverse colon.                           - Diverticulosis in the sigmoid colon and in the                            descending colon.                           -  Lipoma in the transverse colon                           - The examination was otherwise normal on direct                            and retroflexion views. Recommendation:           - Repeat colonoscopy in 5 years for surveillance.                           - Patient has a contact number available for                            emergencies. The signs and symptoms of potential                            delayed complications were discussed with the                            patient. Return to normal activities tomorrow.                            Written discharge instructions were provided to the                            patient.                           - Resume previous diet.                           - Continue present medications.                           - Await pathology results. Ladene Artist, MD 07/11/2016 8:54:00 AM This report has been signed electronically.

## 2016-07-12 ENCOUNTER — Telehealth: Payer: Self-pay | Admitting: *Deleted

## 2016-07-12 DIAGNOSIS — H8110 Benign paroxysmal vertigo, unspecified ear: Secondary | ICD-10-CM | POA: Diagnosis not present

## 2016-07-12 NOTE — Telephone Encounter (Signed)
No answer, left message to call if questions or concerns. 

## 2016-07-12 NOTE — Telephone Encounter (Signed)
  Follow up Call-  Call back number 07/11/2016  Post procedure Call Back phone  # [phone]  Permission to leave phone message Yes  Some recent data might be hidden     Patient questions:  Do you have a fever, pain , or abdominal swelling? No. Pain Score  0 *  Have you tolerated food without any problems? Yes.    Have you been able to return to your normal activities? Yes.    Do you have any questions about your discharge instructions: Diet   No. Medications  No. Follow up visit  No.  Do you have questions or concerns about your Care? No.  Actions: * If pain score is 4 or above: No action needed, pain <4.

## 2016-07-12 NOTE — Telephone Encounter (Signed)
  Follow up Call-  Call back number 07/11/2016  Post procedure Call Back phone  # [phone]  Permission to leave phone message Yes  Some recent data might be hidden     Patient questions:  Do you have a fever, pain , or abdominal swelling? No. Pain Score  0 *  Have you tolerated food without any problems? Yes.    Have you been able to return to your normal activities? Yes.    Do you have any questions about your discharge instructions: Diet   No. Medications  No. Follow up visit  No.  Do you have questions or concerns about your Care? No.  Actions: * If pain score is 4 or above: No action needed, pain <4.

## 2016-07-23 ENCOUNTER — Encounter: Payer: Self-pay | Admitting: Gastroenterology

## 2016-07-24 ENCOUNTER — Institutional Professional Consult (permissible substitution): Payer: Medicare Other | Admitting: Neurology

## 2016-07-24 DIAGNOSIS — H8110 Benign paroxysmal vertigo, unspecified ear: Secondary | ICD-10-CM | POA: Diagnosis not present

## 2016-07-24 DIAGNOSIS — H47233 Glaucomatous optic atrophy, bilateral: Secondary | ICD-10-CM | POA: Diagnosis not present

## 2016-07-24 DIAGNOSIS — H5213 Myopia, bilateral: Secondary | ICD-10-CM | POA: Diagnosis not present

## 2016-07-24 DIAGNOSIS — H2513 Age-related nuclear cataract, bilateral: Secondary | ICD-10-CM | POA: Diagnosis not present

## 2016-07-24 DIAGNOSIS — H401313 Pigmentary glaucoma, right eye, severe stage: Secondary | ICD-10-CM | POA: Diagnosis not present

## 2016-07-24 DIAGNOSIS — H401322 Pigmentary glaucoma, left eye, moderate stage: Secondary | ICD-10-CM | POA: Diagnosis not present

## 2016-07-24 DIAGNOSIS — H524 Presbyopia: Secondary | ICD-10-CM | POA: Diagnosis not present

## 2016-07-24 DIAGNOSIS — H52223 Regular astigmatism, bilateral: Secondary | ICD-10-CM | POA: Diagnosis not present

## 2016-07-24 DIAGNOSIS — I69998 Other sequelae following unspecified cerebrovascular disease: Secondary | ICD-10-CM | POA: Diagnosis not present

## 2016-07-31 DIAGNOSIS — H8110 Benign paroxysmal vertigo, unspecified ear: Secondary | ICD-10-CM | POA: Diagnosis not present

## 2016-08-03 DIAGNOSIS — E1165 Type 2 diabetes mellitus with hyperglycemia: Secondary | ICD-10-CM | POA: Diagnosis not present

## 2016-08-03 DIAGNOSIS — Z79899 Other long term (current) drug therapy: Secondary | ICD-10-CM | POA: Diagnosis not present

## 2016-08-03 DIAGNOSIS — E039 Hypothyroidism, unspecified: Secondary | ICD-10-CM | POA: Diagnosis not present

## 2016-09-05 ENCOUNTER — Emergency Department (HOSPITAL_COMMUNITY): Payer: Medicare Other

## 2016-09-05 ENCOUNTER — Emergency Department (HOSPITAL_COMMUNITY)
Admission: EM | Admit: 2016-09-05 | Discharge: 2016-09-05 | Disposition: A | Discharge status: Home / Self Care | Payer: Medicare Other | Attending: Emergency Medicine | Admitting: Emergency Medicine

## 2016-09-05 ENCOUNTER — Encounter (HOSPITAL_COMMUNITY): Payer: Self-pay

## 2016-09-05 DIAGNOSIS — R5381 Other malaise: Secondary | ICD-10-CM | POA: Diagnosis not present

## 2016-09-05 DIAGNOSIS — R42 Dizziness and giddiness: Secondary | ICD-10-CM | POA: Diagnosis not present

## 2016-09-05 DIAGNOSIS — I951 Orthostatic hypotension: Secondary | ICD-10-CM | POA: Diagnosis not present

## 2016-09-05 DIAGNOSIS — E860 Dehydration: Secondary | ICD-10-CM | POA: Diagnosis not present

## 2016-09-05 DIAGNOSIS — D72829 Elevated white blood cell count, unspecified: Secondary | ICD-10-CM | POA: Insufficient documentation

## 2016-09-05 DIAGNOSIS — J0191 Acute recurrent sinusitis, unspecified: Secondary | ICD-10-CM

## 2016-09-05 DIAGNOSIS — R404 Transient alteration of awareness: Secondary | ICD-10-CM | POA: Diagnosis not present

## 2016-09-05 DIAGNOSIS — Z7982 Long term (current) use of aspirin: Secondary | ICD-10-CM | POA: Insufficient documentation

## 2016-09-05 DIAGNOSIS — Z79899 Other long term (current) drug therapy: Secondary | ICD-10-CM | POA: Diagnosis not present

## 2016-09-05 DIAGNOSIS — E119 Type 2 diabetes mellitus without complications: Secondary | ICD-10-CM | POA: Insufficient documentation

## 2016-09-05 DIAGNOSIS — R05 Cough: Secondary | ICD-10-CM

## 2016-09-05 DIAGNOSIS — R059 Cough, unspecified: Secondary | ICD-10-CM

## 2016-09-05 DIAGNOSIS — R5383 Other fatigue: Secondary | ICD-10-CM | POA: Diagnosis not present

## 2016-09-05 DIAGNOSIS — D729 Disorder of white blood cells, unspecified: Secondary | ICD-10-CM

## 2016-09-05 DIAGNOSIS — E039 Hypothyroidism, unspecified: Secondary | ICD-10-CM | POA: Diagnosis not present

## 2016-09-05 DIAGNOSIS — I959 Hypotension, unspecified: Secondary | ICD-10-CM | POA: Diagnosis present

## 2016-09-05 LAB — CBC WITH DIFFERENTIAL/PLATELET
BASOS ABS: 0 10*3/uL (ref 0.0–0.1)
Basophils Relative: 0 %
Eosinophils Absolute: 0.1 10*3/uL (ref 0.0–0.7)
Eosinophils Relative: 0 %
HEMATOCRIT: 42.9 % (ref 39.0–52.0)
HEMOGLOBIN: 14.7 g/dL (ref 13.0–17.0)
Lymphocytes Relative: 9 %
Lymphs Abs: 1 10*3/uL (ref 0.7–4.0)
MCH: 31.1 pg (ref 26.0–34.0)
MCHC: 34.3 g/dL (ref 30.0–36.0)
MCV: 90.9 fL (ref 78.0–100.0)
MONOS PCT: 9 %
Monocytes Absolute: 1 10*3/uL (ref 0.1–1.0)
NEUTROS ABS: 9.9 10*3/uL — AB (ref 1.7–7.7)
NEUTROS PCT: 82 %
Platelets: 178 10*3/uL (ref 150–400)
RBC: 4.72 MIL/uL (ref 4.22–5.81)
RDW: 13.3 % (ref 11.5–15.5)
WBC: 12 10*3/uL — ABNORMAL HIGH (ref 4.0–10.5)

## 2016-09-05 LAB — BASIC METABOLIC PANEL
ANION GAP: 7 (ref 5–15)
BUN: 11 mg/dL (ref 6–20)
CHLORIDE: 101 mmol/L (ref 101–111)
CO2: 27 mmol/L (ref 22–32)
Calcium: 8.8 mg/dL — ABNORMAL LOW (ref 8.9–10.3)
Creatinine, Ser: 1.15 mg/dL (ref 0.61–1.24)
GFR calc non Af Amer: >60 mL/min (ref >60)
Glucose, Bld: 135 mg/dL — ABNORMAL HIGH (ref 65–99)
POTASSIUM: 4 mmol/L (ref 3.5–5.1)
Sodium: 135 mmol/L (ref 135–145)

## 2016-09-05 LAB — I-STAT TROPONIN, ED
TROPONIN I, POC: 0 ng/mL (ref 0.00–0.08)
Troponin i, poc: 0 ng/mL (ref 0.00–0.08)

## 2016-09-05 MED ORDER — IPRATROPIUM BROMIDE 0.02 % IN SOLN
0.5000 mg | Freq: Once | RESPIRATORY_TRACT | Status: AC
  Administered 2016-09-05: 0.5 mg via RESPIRATORY_TRACT
  Filled 2016-09-05: qty 2.5

## 2016-09-05 MED ORDER — SODIUM CHLORIDE 0.9 % IV BOLUS (SEPSIS)
1000.0000 mL | Freq: Once | INTRAVENOUS | Status: AC
  Administered 2016-09-05: 1000 mL via INTRAVENOUS

## 2016-09-05 MED ORDER — ALBUTEROL SULFATE (2.5 MG/3ML) 0.083% IN NEBU
5.0000 mg | INHALATION_SOLUTION | Freq: Once | RESPIRATORY_TRACT | Status: AC
  Administered 2016-09-05: 5 mg via RESPIRATORY_TRACT
  Filled 2016-09-05: qty 6

## 2016-09-05 MED ORDER — ALBUTEROL SULFATE HFA 108 (90 BASE) MCG/ACT IN AERS: 2.0000 | INHALATION_SPRAY | RESPIRATORY_TRACT | 0 refills | Status: DC | PRN

## 2016-09-05 MED ORDER — LEVOFLOXACIN 750 MG PO TABS: 750.0000 mg | ORAL_TABLET | Freq: Every day | ORAL | 0 refills | Status: DC

## 2016-09-05 NOTE — Discharge Instructions (Signed)
Continue to stay well-hydrated. Gargle warm salt water and spit it out. Use chloraseptic spray as needed for sore throat. Continue to alternate between Tylenol and Ibuprofen for pain or fever. Use Mucinex for cough suppression/expectoration of mucus. Use netipot and flonase to help with nasal congestion. May consider over-the-counter Benadryl or other antihistamine to decrease secretions and for help with your symptoms. Take antibiotic as directed until completed. Use inhaler as directed, as needed for cough/chest congestion/wheezing/shortness of breath. Follow up with your primary care doctor in 3-4 days for recheck of ongoing symptoms. Return to emergency department for emergent changing or worsening of symptoms.

## 2016-09-05 NOTE — ED Provider Notes (Signed)
[facility] DEPT Provider Note   CSN: [id number] Arrival date & time: 09/05/16  1724     History   Chief Complaint Chief Complaint  Patient presents with  . Hypotension    HPI Connor Becker is a 66 y.o. male with a PMHx of BPPV, DM2, hypogonadism, HLD, OSA not on CPAP, hypothyroidism, allergic rhinitis, and GERD, with a PSHx of uvulopalatopharyngoplasty, nasal septum surgery, and tonsilectomy, who presents to the ED via EMS from his PCPs office after an episode of orthostatic hypotension. Patient states that he was being seen at his PCPs office for "sinusitis and bronchitis", they had performed orthostatic vital signs and right after that he was sitting in his chair and stood up to get on the bed and felt lightheaded, became diaphoretic, and nauseated. He states that he laid down on the bed and raised his legs up which helped. EMS arrived and found to be orthostatic, gave him 500 mL IV fluids and he states this helped significantly. His symptoms only seem to occur when he tried to stand up. No other known aggravating factors. He arrives to the ER with no ongoing symptoms. He states he has a history of vertigo, but today's episode was different, denies any spinning sensation during today's event. He admits that he has not been drinking very much or staying hydrated over the last few days due to his illness. His URI symptoms include chills, sinus congestion, cough with white sputum production, mild wheezing, and sore throat 3 days. He has been taking Sudafed, Mucinex, and Phenergan without significant relief, and he took all these medications this morning around 10:30 AM. He denies being a smoker, denies asthma or COPD history, and denies any known sick contacts.  He denies fevers, ear pain/drainage, drooling, trismus, CP, SOB, LE swelling, recent travel/surgery/immobilization, personal hx of DVT/PE, abd pain, ongoing nausea, vomiting/diarrhea/constipation, hematuria, dysuria, myalgias,  arthralgias, vision changes, HA, numbness, tingling, focal weakness, syncope, or any other complaints at this time. +FHx of DVT and MI in his mother and father.    The history is provided by the patient and medical records. No language interpreter was used.  Dizziness  Quality:  Lightheadedness Severity:  Mild Onset quality:  Sudden Duration:  1 hour Timing:  Sporadic Progression:  Resolved Chronicity:  New Context: standing up   Relieved by:  Fluids and lying down Worsened by:  Standing up Ineffective treatments:  None tried Associated symptoms: nausea   Associated symptoms: no chest pain, no diarrhea, no headaches, no shortness of breath, no syncope, no vision changes, no vomiting and no weakness   Risk factors: hx of vertigo and multiple medications     Past Medical History:  Diagnosis Date  . ALLERGIC RHINITIS   . DM2 (diabetes mellitus, type 2) ([place])   . GERD   . History of nuclear stress test    Myoview 12/17: EF 53, no ischemia, Low Risk  . HYPERLIPIDEMIA    myalgias from Lipitor  . HYPOGONADISM, MALE   . INSOMNIA-SLEEP DISORDER-UNSPEC   . OSTEOARTHRITIS   . SLEEP APNEA, OBSTRUCTIVE    no CPAP; repeat sleep test pending 07/2016  . Unspecified hypothyroidism 12/04/2013    Patient Active Problem List   Diagnosis Date Noted  . Fatigue 02/01/2016  . Dizziness 02/01/2016  . PSA elevation 11/17/2015  . Osteoarthrosis involving lower leg 11/17/2015  . Ulnar neuritis 04/14/2015  . Acute sinus infection 04/27/2014  . Hypothyroidism 12/04/2013  . Morbid obesity ([place]) 12/04/2013  . Right shoulder pain 10/31/2010  .  Preventative health care 10/28/2010  . Diabetes ([place]) 10/26/2010  . INSOMNIA-SLEEP DISORDER-UNSPEC 07/11/2007  . GERD 07/11/2007  . Male hypogonadism 04/06/2007  . Hyperlipidemia 04/06/2007  . ERECTILE DYSFUNCTION 04/06/2007  . SLEEP APNEA, OBSTRUCTIVE 04/06/2007  . Allergic rhinitis 04/06/2007  . OSTEOARTHRITIS 04/06/2007    Past Surgical History:    Procedure Laterality Date  . NASAL SEPTUM SURGERY    . TONSILLECTOMY    . UVULOPALATOPHARYNGOPLASTY    . VASECTOMY         Home Medications    Prior to Admission medications   Medication Sig Start Date End Date Taking? Authorizing Provider  aspirin EC 81 MG tablet Take 1 tablet (81 mg total) by mouth daily. 12/04/13   Biagio Borg, MD  esomeprazole (NEXIUM) 40 MG capsule Take 1 capsule (40 mg total) by mouth daily. Patient not taking: Reported on 07/11/2016 02/01/16   Biagio Borg, MD  levothyroxine (SYNTHROID, LEVOTHROID) 100 MCG tablet Take 100 mcg by mouth daily before breakfast.    Historical Provider, MD  meloxicam (MOBIC) 15 MG tablet Take 1 tablet (15 mg total) by mouth daily. Patient taking differently: Take 15 mg by mouth daily as needed.  11/17/15   Biagio Borg, MD    Family History Family History  Problem Relation Age of Onset  . Diabetes Maternal Uncle   . Heart attack Maternal Uncle   . Hypertension    . Prostate cancer    . Heart disease    . Heart attack Mother 74    s/p MI  . Other Mother     pacemaker  . Diabetes Mother   . Colon cancer Neg Hx     Social History Social History  Substance Use Topics  . Smoking status: Never Smoker  . Smokeless tobacco: Never Used  . Alcohol use Yes     Comment: once every 21months     Allergies   Lipitor [atorvastatin] and Metformin and related   Review of Systems Review of Systems  Constitutional: Positive for chills and diaphoresis. Negative for fever.  HENT: Positive for congestion and sore throat. Negative for drooling, ear discharge, ear pain and trouble swallowing.   Eyes: Negative for visual disturbance.  Respiratory: Positive for cough and wheezing. Negative for shortness of breath.   Cardiovascular: Negative for chest pain, leg swelling and syncope.  Gastrointestinal: Positive for nausea. Negative for abdominal pain, constipation, diarrhea and vomiting.  Genitourinary: Negative for dysuria and  hematuria.  Musculoskeletal: Negative for arthralgias and myalgias.  Skin: Negative for color change.  Allergic/Immunologic: Positive for immunocompromised state (DM2).  Neurological: Positive for dizziness and light-headedness. Negative for syncope, weakness, numbness and headaches.  Psychiatric/Behavioral: Negative for confusion.   10 Systems reviewed and are negative for acute change except as noted in the HPI.   Physical Exam Updated Vital Signs BP 120/62 (BP Location: Left Arm)   Pulse 84   Temp 98.6 F (37 C) (Oral)   Resp 18   Ht 6\' 3"  (1.905 m)   Wt 132 kg   SpO2 98%   BMI 36.37 kg/m   Physical Exam  Constitutional: He is oriented to person, place, and time. Vital signs are normal. He appears well-developed and well-nourished.  Non-toxic appearance. No distress.  Afebrile, nontoxic, NAD  HENT:  Head: Normocephalic and atraumatic.  Right Ear: Hearing, tympanic membrane, external ear and ear canal normal.  Left Ear: Hearing, tympanic membrane, external ear and ear canal normal.  Nose: Mucosal edema and rhinorrhea present.  Mouth/Throat: Mucous membranes are dry. No trismus in the jaw. No uvula swelling (surgically absent). Posterior oropharyngeal erythema present. No oropharyngeal exudate, posterior oropharyngeal edema or tonsillar abscesses. Tonsils are 0 on the right. Tonsils are 0 on the left. No tonsillar exudate.  Ears clear bilaterally. Nose with mild mucosal edema and rhinorrhea.  Dry mucous membranes Uvula and tonsils surgically absent. Oropharynx injected, no trismus or drooling, no exudates.  Eyes: Conjunctivae and EOM are normal. Right eye exhibits no discharge. Left eye exhibits no discharge.  Neck: Normal range of motion. Neck supple.  Cardiovascular: Normal rate, regular rhythm, normal heart sounds and intact distal pulses.  Exam reveals no gallop and no friction rub.   No murmur heard. RRR, nl s1/s2, no m/r/g, distal pulses intact, no pedal edema     Pulmonary/Chest: Effort normal. No respiratory distress. He has no decreased breath sounds. He has wheezes. He has no rhonchi. He has no rales.  Faint expiratory wheezing noted while pt coughs, no rhonchi/rales, no hypoxia or increased WOB, speaking in full sentences, SpO2 98-100% on RA   Abdominal: Soft. Normal appearance and bowel sounds are normal. He exhibits no distension. There is no tenderness. There is no rigidity, no rebound, no guarding, no CVA tenderness, no tenderness at McBurney's point and negative Murphy's sign.  Musculoskeletal: Normal range of motion.  MAE x4 Strength and sensation grossly intact in all extremities Distal pulses intact No pedal edema, neg homan's bilaterally   Neurological: He is alert and oriented to person, place, and time. He has normal strength. No sensory deficit.  Skin: Skin is warm, dry and intact. No rash noted.  Psychiatric: He has a normal mood and affect.  Nursing note and vitals reviewed.   17:40 Orthostatic Vital Signs- after 772mL bolus DG  Orthostatic Lying   BP- Lying: 123/75  Pulse- Lying: 88      Orthostatic Sitting  BP- Sitting: 118/74  Pulse- Sitting: 94      Orthostatic Standing at 0 minutes  BP- Standing at 0 minutes: 111/85  Pulse- Standing at 0 minutes: 98    ED Treatments / Results  Labs (all labs ordered are listed, but only abnormal results are displayed) Labs Reviewed  CBC WITH DIFFERENTIAL/PLATELET - Abnormal; Notable for the following:       Result Value   WBC 12.0 (*)    Neutro Abs 9.9 (*)    All other components within normal limits  BASIC METABOLIC PANEL - Abnormal; Notable for the following:    Glucose, Bld 135 (*)    Calcium 8.8 (*)    All other components within normal limits  I-STAT TROPOININ, ED  I-STAT TROPOININ, ED    EKG  EKG Interpretation  Date/Time:  Wednesday September 05 2016 17:31:49 EST Ventricular Rate:  86 PR Interval:    QRS Duration: 96 QT Interval:  346 QTC  Calculation: 414 R Axis:   62 Text Interpretation:  Sinus rhythm Abnormal R-wave progression, early transition flipped t wave in lead III resolved Otherwise no significant change Confirmed by FLOYD MD, DANIEL [phone]) on 09/05/2016 5:42:50 PM       Radiology Dg Chest 2 View  Result Date: 09/05/2016 CLINICAL DATA:  Cough and chest congestion for 3 days. Near syncopal episode. Nausea. EXAM: CHEST  2 VIEW COMPARISON:  07/30/2011 FINDINGS: The heart size and mediastinal contours are within normal limits. Both lungs are clear. The visualized skeletal structures are unremarkable. IMPRESSION: No active cardiopulmonary disease. Electronically Signed   By: Earle Gell  M.D.   On: 09/05/2016 18:45     Myoview stress test 06/2016: negative Study Highlights    Nuclear stress EF: 53%.  Blood pressure demonstrated a normal response to exercise.  No T wave inversion was noted during stress.  There was no ST segment deviation noted during stress.  This is a low risk study.   No significant reversible ischemia. LVEF 53% with normal wall motion. This is a low risk study.     Procedures Procedures (including critical care time)  Medications Ordered in ED Medications  albuterol (PROVENTIL) (2.5 MG/3ML) 0.083% nebulizer solution 5 mg (5 mg Nebulization Given 09/05/16 1920)  ipratropium (ATROVENT) nebulizer solution 0.5 mg (0.5 mg Nebulization Given 09/05/16 1920)  sodium chloride 0.9 % bolus 1,000 mL (0 mLs Intravenous Stopped 09/05/16 2037)  sodium chloride 0.9 % bolus 1,000 mL (1,000 mLs Intravenous New Bag/Given 09/05/16 2128)     Initial Impression / Assessment and Plan / ED Course  I have reviewed the triage vital signs and the nursing notes.  Pertinent labs & imaging results that were available during my care of the patient were reviewed by me and considered in my medical decision making (see chart for details).     66 y.o. male here sent in from his PCPs office for orthostatic  hypotension in the office. Had episode of lightheadedness, diaphoresis, and nausea after doing orthostatic VS in the office, got up from seated position and had symptoms; laid down and felt better. Found to be orthostatic on EMS arrival, which improved after 57mL bolus; orthostatics here done after he received ~79mLs and although his BP still dropped slightly from laying to standing, it was no longer as significant (123/75-->111/85; HR 88-->98). Arrives without any ongoing complaints. Admits to poor PO intake for several days due to having a cough and congestion, feels like he has a sinus infection and/or bronchitis. Had taken sudafed, mucinex, and phenergan this morning which could be contributing to his episode. On exam, dry mucous membranes, oropharynx mildly injected, lung sounds with mild faint expiratory wheezing, moderate nasal congestion, BP 120/62, no tachycardia or hypoxia. Denies CP/SOB, etc. EKG without ischemic findings, no acute changes seen. Recent myoview stress test was negative. Likely orthostatic hypotension due to poor hydration. Will get CBC, BMP, trop, and CXR, and give fluids and duoneb then reassess shortly.   9:00 PM CBC w/diff with mild leukocytosis of neutrophilic predominance. BMP unremarkable. Trop negative. CXR negative. Lung sounds greatly improved after duoneb. Pt feeling much better after fluids, no longer lightheaded with standing. BP stable and improved from initial reports. Will repeat troponin to ensure no acute cardiac etiology, and in the meantime continue giving more fluids, then likely d/c home with inhaler and levaquin for his sinusitis (pt very specific about this being the only thing that works, and he regularly needs abx for his sinusitis, due to the fact that he's had so many nasal surgeries I feel this is reasonable). Will reassess shortly  10:18 PM Second trop negative, second bolus completed. Pt continues to be stable and BP still stable and improved, no  longer orthostatic, no ongoing symptoms. Will d/c home with levaquin and inhaler; advised OTC meds for symptom control of URI, encouraged pt to staying hydrated, and f/up with PCP in 3-4 days for recheck of symptoms. I explained the diagnosis and have given explicit precautions to return to the ER including for any other new or worsening symptoms. The patient understands and accepts the medical plan as it's been dictated  and I have answered their questions. Discharge instructions concerning home care and prescriptions have been given. The patient is STABLE and is discharged to home in good condition.   Final Clinical Impressions(s) / ED Diagnoses   Final diagnoses:  Orthostatic hypotension  Dehydration  Acute recurrent sinusitis, unspecified location  Cough  Neutrophilic leukocytosis  Orthostatic lightheadedness    New Prescriptions New Prescriptions   ALBUTEROL (PROVENTIL HFA;VENTOLIN HFA) 108 (90 BASE) MCG/ACT INHALER    Inhale 2 puffs into the lungs every 4 (four) hours as needed for wheezing or shortness of breath (cough).   LEVOFLOXACIN (LEVAQUIN) 750 MG TABLET    Take 1 tablet (750 mg total) by mouth daily. X 7 days     [address] PA-C 09/05/16 2219    Deno Etienne, DO 09/05/16 [place], DO 09/05/16 2243

## 2016-09-05 NOTE — ED Notes (Signed)
Patient transported to [id number]

## 2016-09-05 NOTE — ED Triage Notes (Signed)
Pt arrives EMS from MD office where he was seen for sinus symptoms. Pt was hypotensive at office with no reported radial pulse on standing. EMS report pale and diaphoretic positive for orthostatic hypotension. Given 500cc NS by EMS. And placed in trendelenburg and pt states he feels better.

## 2016-09-24 DIAGNOSIS — E039 Hypothyroidism, unspecified: Secondary | ICD-10-CM | POA: Diagnosis not present

## 2016-10-22 DIAGNOSIS — M1711 Unilateral primary osteoarthritis, right knee: Secondary | ICD-10-CM | POA: Diagnosis not present

## 2016-10-22 DIAGNOSIS — M7541 Impingement syndrome of right shoulder: Secondary | ICD-10-CM | POA: Diagnosis not present

## 2016-11-12 DIAGNOSIS — E1165 Type 2 diabetes mellitus with hyperglycemia: Secondary | ICD-10-CM | POA: Diagnosis not present

## 2016-11-12 DIAGNOSIS — I8312 Varicose veins of left lower extremity with inflammation: Secondary | ICD-10-CM | POA: Diagnosis not present

## 2016-11-12 DIAGNOSIS — M1711 Unilateral primary osteoarthritis, right knee: Secondary | ICD-10-CM | POA: Diagnosis not present

## 2016-11-12 DIAGNOSIS — M25561 Pain in right knee: Secondary | ICD-10-CM | POA: Diagnosis not present

## 2016-12-19 ENCOUNTER — Other Ambulatory Visit: Payer: Self-pay | Admitting: Orthopaedic Surgery

## 2016-12-19 DIAGNOSIS — M25511 Pain in right shoulder: Secondary | ICD-10-CM

## 2017-01-14 ENCOUNTER — Ambulatory Visit
Admission: RE | Admit: 2017-01-14 | Discharge: 2017-01-14 | Disposition: A | Discharge status: Home / Self Care | Payer: Medicare Other | Source: Ambulatory Visit | Attending: Orthopaedic Surgery | Admitting: Orthopaedic Surgery

## 2017-01-14 DIAGNOSIS — M19011 Primary osteoarthritis, right shoulder: Secondary | ICD-10-CM | POA: Diagnosis not present

## 2017-01-14 DIAGNOSIS — M25511 Pain in right shoulder: Secondary | ICD-10-CM

## 2017-01-21 DIAGNOSIS — M1711 Unilateral primary osteoarthritis, right knee: Secondary | ICD-10-CM | POA: Diagnosis not present

## 2017-02-05 DIAGNOSIS — Z0000 Encounter for general adult medical examination without abnormal findings: Secondary | ICD-10-CM | POA: Diagnosis not present

## 2017-02-05 DIAGNOSIS — E559 Vitamin D deficiency, unspecified: Secondary | ICD-10-CM | POA: Diagnosis not present

## 2017-02-05 DIAGNOSIS — E039 Hypothyroidism, unspecified: Secondary | ICD-10-CM | POA: Diagnosis not present

## 2017-02-05 DIAGNOSIS — R42 Dizziness and giddiness: Secondary | ICD-10-CM | POA: Diagnosis not present

## 2017-02-05 DIAGNOSIS — I10 Essential (primary) hypertension: Secondary | ICD-10-CM | POA: Diagnosis not present

## 2017-02-05 DIAGNOSIS — E1165 Type 2 diabetes mellitus with hyperglycemia: Secondary | ICD-10-CM | POA: Diagnosis not present

## 2017-02-28 DIAGNOSIS — Z7982 Long term (current) use of aspirin: Secondary | ICD-10-CM | POA: Diagnosis not present

## 2017-02-28 DIAGNOSIS — J019 Acute sinusitis, unspecified: Secondary | ICD-10-CM | POA: Diagnosis not present

## 2017-05-01 DIAGNOSIS — M1711 Unilateral primary osteoarthritis, right knee: Secondary | ICD-10-CM | POA: Diagnosis not present

## 2017-05-01 DIAGNOSIS — M1712 Unilateral primary osteoarthritis, left knee: Secondary | ICD-10-CM | POA: Diagnosis not present

## 2017-05-06 DIAGNOSIS — E039 Hypothyroidism, unspecified: Secondary | ICD-10-CM | POA: Diagnosis not present

## 2017-05-06 DIAGNOSIS — E1165 Type 2 diabetes mellitus with hyperglycemia: Secondary | ICD-10-CM | POA: Diagnosis not present

## 2017-07-17 DIAGNOSIS — M1711 Unilateral primary osteoarthritis, right knee: Secondary | ICD-10-CM | POA: Diagnosis not present

## 2017-08-05 DIAGNOSIS — M1711 Unilateral primary osteoarthritis, right knee: Secondary | ICD-10-CM | POA: Diagnosis not present

## 2017-08-05 DIAGNOSIS — R197 Diarrhea, unspecified: Secondary | ICD-10-CM | POA: Diagnosis not present

## 2017-08-05 DIAGNOSIS — E039 Hypothyroidism, unspecified: Secondary | ICD-10-CM | POA: Diagnosis not present

## 2017-08-05 DIAGNOSIS — R5383 Other fatigue: Secondary | ICD-10-CM | POA: Diagnosis not present

## 2017-08-05 DIAGNOSIS — E1165 Type 2 diabetes mellitus with hyperglycemia: Secondary | ICD-10-CM | POA: Diagnosis not present

## 2017-08-06 ENCOUNTER — Other Ambulatory Visit: Payer: Self-pay | Admitting: Orthopaedic Surgery

## 2017-08-09 ENCOUNTER — Other Ambulatory Visit: Payer: Self-pay | Admitting: Orthopaedic Surgery

## 2017-08-21 ENCOUNTER — Encounter (HOSPITAL_COMMUNITY)
Admission: RE | Admit: 2017-08-21 | Discharge: 2017-08-21 | Disposition: A | Discharge status: Home / Self Care | Payer: Medicare Other | Source: Ambulatory Visit | Attending: Orthopaedic Surgery | Admitting: Orthopaedic Surgery

## 2017-08-21 ENCOUNTER — Encounter (HOSPITAL_COMMUNITY): Payer: Self-pay

## 2017-08-21 ENCOUNTER — Other Ambulatory Visit: Payer: Self-pay

## 2017-08-21 DIAGNOSIS — Z01812 Encounter for preprocedural laboratory examination: Secondary | ICD-10-CM | POA: Insufficient documentation

## 2017-08-21 HISTORY — DX: Unilateral primary osteoarthritis, unspecified knee: M17.10

## 2017-08-21 HISTORY — DX: Osteoarthritis of knee, unspecified: M17.9

## 2017-08-21 LAB — URINALYSIS, ROUTINE W REFLEX MICROSCOPIC
BILIRUBIN URINE: NEGATIVE
GLUCOSE, UA: NEGATIVE mg/dL
HGB URINE DIPSTICK: NEGATIVE
KETONES UR: NEGATIVE mg/dL
Leukocytes, UA: NEGATIVE
Nitrite: NEGATIVE
Protein, ur: NEGATIVE mg/dL
Specific Gravity, Urine: 1.024 (ref 1.005–1.030)
pH: 5 (ref 5.0–8.0)

## 2017-08-21 LAB — PROTIME-INR
INR: 0.99
PROTHROMBIN TIME: 13 s (ref 11.4–15.2)

## 2017-08-21 LAB — CBC WITH DIFFERENTIAL/PLATELET
BASOS PCT: 0 %
Basophils Absolute: 0 10*3/uL (ref 0.0–0.1)
EOS ABS: 0.2 10*3/uL (ref 0.0–0.7)
Eosinophils Relative: 2 %
HEMATOCRIT: 46.4 % (ref 39.0–52.0)
HEMOGLOBIN: 15.6 g/dL (ref 13.0–17.0)
LYMPHS ABS: 2.8 10*3/uL (ref 0.7–4.0)
Lymphocytes Relative: 29 %
MCH: 31 pg (ref 26.0–34.0)
MCHC: 33.6 g/dL (ref 30.0–36.0)
MCV: 92.1 fL (ref 78.0–100.0)
Monocytes Absolute: 0.8 10*3/uL (ref 0.1–1.0)
Monocytes Relative: 8 %
NEUTROS ABS: 5.8 10*3/uL (ref 1.7–7.7)
NEUTROS PCT: 61 %
Platelets: 232 10*3/uL (ref 150–400)
RBC: 5.04 MIL/uL (ref 4.22–5.81)
RDW: 13.1 % (ref 11.5–15.5)
WBC: 9.6 10*3/uL (ref 4.0–10.5)

## 2017-08-21 LAB — BASIC METABOLIC PANEL
ANION GAP: 12 (ref 5–15)
BUN: 21 mg/dL — ABNORMAL HIGH (ref 6–20)
CALCIUM: 9.5 mg/dL (ref 8.9–10.3)
CHLORIDE: 101 mmol/L (ref 101–111)
CO2: 25 mmol/L (ref 22–32)
Creatinine, Ser: 1 mg/dL (ref 0.61–1.24)
GFR calc non Af Amer: >60 mL/min (ref >60)
Glucose, Bld: 123 mg/dL — ABNORMAL HIGH (ref 65–99)
Potassium: 4.6 mmol/L (ref 3.5–5.1)
SODIUM: 138 mmol/L (ref 135–145)

## 2017-08-21 LAB — TYPE AND SCREEN
ABO/RH(D): A POS
ANTIBODY SCREEN: NEGATIVE

## 2017-08-21 LAB — ABO/RH: ABO/RH(D): A POS

## 2017-08-21 LAB — SURGICAL PCR SCREEN
MRSA, PCR: NEGATIVE
Staphylococcus aureus: NEGATIVE

## 2017-08-21 LAB — GLUCOSE, CAPILLARY: GLUCOSE-CAPILLARY: 96 mg/dL (ref 65–99)

## 2017-08-21 LAB — APTT: aPTT: 42 seconds — ABNORMAL HIGH (ref 24–36)

## 2017-08-21 NOTE — Pre-Procedure Instructions (Signed)
[place]  08/21/2017      [place], [place] [address] [place] [address] Phone: [phone] Fax: [phone]    Your procedure is scheduled on Tuesday, August 27, 2017  Report to [facility] Admitting Entrance "A" at 8:15AM   Call this number if you have problems the morning of surgery:  [phone]   Remember:  Do not eat food or drink liquids after midnight.  Take these medicines the morning of surgery with A SIP OF WATER: Levothyroxine (SYNTHROID, LEVOTHROID)  As of today, stop taking all Aspirins, Vitamins, Fish oils, and Herbal medications. Also stop all NSAIDS i.e. Advil, Ibuprofen, Motrin, Aleve, Anaprox, Naproxen, BC and Goody Powders.  How to Manage Your Diabetes Before and After Surgery  Why is it important to control my blood sugar before and after surgery? . Improving blood sugar levels before and after surgery helps healing and can limit problems. . A way of improving blood sugar control is eating a healthy diet by: o  Eating less sugar and carbohydrates o  Increasing activity/exercise o  Talking with your doctor about reaching your blood sugar goals . High blood sugars (greater than 180 mg/dL) can raise your risk of infections and slow your recovery, so you will need to focus on controlling your diabetes during the weeks before surgery. . Make sure that the doctor who takes care of your diabetes knows about your planned surgery including the date and location.  How do I manage my blood sugar before surgery? . Check your blood sugar at least 4 times a day, starting 2 days before surgery, to make sure that the level is not too high or low. o Check your blood sugar the morning of your surgery when you wake up and every 2 hours until you get to the Short Stay unit. . If your blood sugar is less than 70 mg/dL, you will need to treat for low blood sugar: o Do not take  insulin. o Treat a low blood sugar (less than 70 mg/dL) with  cup of clear juice (cranberry or apple), 4 glucose tablets, OR glucose gel. Recheck blood sugar in 15 minutes after treatment (to make sure it is greater than 70 mg/dL). If your blood sugar is not greater than 70 mg/dL on recheck, call [phone] o  for further instructions. . Report your blood sugar to the short stay nurse when you get to Short Stay.  . If you are admitted to the hospital after surgery: o Your blood sugar will be checked by the staff and you will probably be given insulin after surgery (instead of oral diabetes medicines) to make sure you have good blood sugar levels. o The goal for blood sugar control after surgery is 80-180 mg/dL.  . If your CBG is greater than 220 mg/dL, call us at [phone]   Do not wear jewelry.  Do not wear lotions, powders, cologens, or deodorant.  Do not shave 48 hours prior to surgery.  Men may shave face and neck.  Do not bring valuables to the hospital.  [facility] is not responsible for any belongings or valuables.  Contacts, dentures or bridgework may not be worn into surgery.  Leave your suitcase in the car.  After surgery it may be brought to your room.  For patients admitted to the hospital, discharge time will be determined by your treatment team.  Patients discharged the day of  surgery will not be allowed to drive home.   Special instructions:   [facility]- Preparing For Surgery  Before surgery, you can play an important role. Because skin is not sterile, your skin needs to be as free of germs as possible. You can reduce the number of germs on your skin by washing with CHG (chlorahexidine gluconate) Soap before surgery.  CHG is an antiseptic cleaner which kills germs and bonds with the skin to continue killing germs even after washing.  Please do not use if you have an allergy to CHG or antibacterial soaps. If your skin becomes reddened/irritated stop using the CHG.   Do not shave (including legs and underarms) for at least 48 hours prior to first CHG shower. It is OK to shave your face.  Please follow these instructions carefully.   1. Shower the NIGHT BEFORE SURGERY and the MORNING OF SURGERY with CHG.   2. If you chose to wash your hair, wash your hair first as usual with your normal shampoo.  3. After you shampoo, rinse your hair and body thoroughly to remove the shampoo.  4. Use CHG as you would any other liquid soap. You can apply CHG directly to the skin and wash gently with a scrungie or a clean washcloth.   5. Apply the CHG Soap to your body ONLY FROM THE NECK DOWN.  Do not use on open wounds or open sores. Avoid contact with your eyes, ears, mouth and genitals (private parts). Wash Face and genitals (private parts)  with your normal soap.  6. Wash thoroughly, paying special attention to the area where your surgery will be performed.  7. Thoroughly rinse your body with warm water from the neck down.  8. DO NOT shower/wash with your normal soap after using and rinsing off the CHG Soap.  9. Pat yourself dry with a CLEAN TOWEL.  10. Wear CLEAN PAJAMAS to bed the night before surgery, wear comfortable clothes the morning of surgery  11. Place CLEAN SHEETS on your bed the night of your first shower and DO NOT SLEEP WITH PETS.  Day of Surgery: Do not apply any deodorants/lotions. Please wear clean clothes to the hospital/surgery center.    Please read over the following fact sheets that you were given. Pain Booklet, Coughing and Deep Breathing, Total Joint Packet, MRSA Information and Surgical Site Infection Prevention

## 2017-08-21 NOTE — Progress Notes (Addendum)
PCP - Dr. Glendale Chard  Cardiologist - Denies  Chest x-ray - 09/05/16 (E)  EKG - 09/05/16 (E)  Stress Test - 09/04/15 (E)  ECHO - Denies  Cardiac Cath - Denies  Sleep Study - Yes- Positive CPAP - No- Corrected with surgery  LABS- 08/21/17: CBC w/D, BMP,PT, PTT, T/S, UA  HA1C- Requested Fasting Blood Sugar - 123, Today 96- Pt was feeling weak. Ginger ale and graham crackers given. Checks Blood Sugar ___1__ times a day Pt controls his bs with diet. Pt sts he had not had no eaten lunch yet. Pt also sts he had a HA1C drawn last week by Dr. Baird Cancer. Records requested.  Anesthesia- Yes- Requested records  Pt denies having chest pain, sob, or fever at this time. All instructions explained to the pt, with a verbal understanding of the material. Pt agrees to go over the instructions while at home for a better understanding. The opportunity to ask questions was provided.

## 2017-08-22 ENCOUNTER — Telehealth (HOSPITAL_COMMUNITY): Payer: Self-pay | Admitting: Vascular Surgery

## 2017-08-22 NOTE — Progress Notes (Signed)
Anesthesia: Patient was scheduled for TKA with Dr. Rhona Raider on 08/27/17. His PAT visit was on 08/21/17. Chart was forwarded for anesthesia review, but case is not currently posted.   History includes fatigue, dizziness, hypothyroidism, morbid obesity, DM, GERD, HLD, OSA (s/p UPPP).   - ED visit 09/05/16 for near syncope, thought likely vasovagal. Out-patient cardiology evaluation and/or holter monitor was considered.   PCP is Dr. Glendale Chard. He does not see a cardiologist, but saw Richardson Dopp, PA-C ([facility]) 06/22/16 for dizziness and chest pain evaluation. Chest pain was felt more typical musculoskeletal.  Dizziness felt likely related to vertigo with possible component of orthostasis. Continued PT and PCP follow-up recommended for dizziness. Stress test was normal, so PRN cardiology follow-up recommended.   EKG 09/05/16: SR, abnormal R wave progression, early transition.  Nuclear stress test 07/03/16:  Nuclear stress EF: 53%.  Blood pressure demonstrated a normal response to exercise.  No T wave inversion was noted during stress.  There was no ST segment deviation noted during stress.  This is a low risk study. No significant reversible ischemia. LVEF 53% with normal wall motion. This is a low risk study.  08/21/17 labs noted. PTT was elevated at 42.  Case is currently cancelled, but unclear if he will be rescheduled in the near future. I did leave a voice message with Juliann Pulse at Dr. Jerald Kief office regarding elevated PTT in case Dr. Rhona Raider wanted that rechecked or further evaluated prior to rescheduling surgery. I also inquired whether or not patient had medical clearance given history of near syncope last year, and unclear if it was further evaluated (or if thought a part of his vertigo).   George Hugh [facility] Short Stay Center/Anesthesiology Phone [phone] 08/22/2017 6:20 PM

## 2017-08-22 NOTE — H&P (Signed)
TOTAL KNEE ADMISSION H&P  Patient is being admitted for right total knee arthroplasty.  Subjective:  Chief Complaint:right knee pain.  HPI: Connor Becker., 67 y.o. male, has a history of pain and functional disability in the right knee due to arthritis and has failed non-surgical conservative treatments for greater than 12 weeks to includeNSAID's and/or analgesics, corticosteriod injections, flexibility and strengthening excercises, use of assistive devices, weight reduction as appropriate and activity modification.  Onset of symptoms was gradual, starting 5 years ago with gradually worsening course since that time. The patient noted no past surgery on the right knee(s).  Patient currently rates pain in the right knee(s) at 10 out of 10 with activity. Patient has night pain, worsening of pain with activity and weight bearing, pain that interferes with activities of daily living, crepitus and joint swelling.  Patient has evidence of subchondral cysts, subchondral sclerosis, periarticular osteophytes and joint space narrowing by imaging studies. There is no active infection.  Patient Active Problem List   Diagnosis Date Noted  . Fatigue 02/01/2016  . Dizziness 02/01/2016  . PSA elevation 11/17/2015  . Osteoarthrosis involving lower leg 11/17/2015  . Ulnar neuritis 04/14/2015  . Acute sinus infection 04/27/2014  . Hypothyroidism 12/04/2013  . Morbid obesity ([place]) 12/04/2013  . Right shoulder pain 10/31/2010  . Preventative health care 10/28/2010  . Diabetes ([place]) 10/26/2010  . INSOMNIA-SLEEP DISORDER-UNSPEC 07/11/2007  . GERD 07/11/2007  . Male hypogonadism 04/06/2007  . Hyperlipidemia 04/06/2007  . ERECTILE DYSFUNCTION 04/06/2007  . SLEEP APNEA, OBSTRUCTIVE 04/06/2007  . Allergic rhinitis 04/06/2007  . OSTEOARTHRITIS 04/06/2007   Past Medical History:  Diagnosis Date  . ALLERGIC RHINITIS   . DJD (degenerative joint disease) of knee    right  . DM2 (diabetes mellitus, type 2)  ([place])   . GERD   . History of nuclear stress test    Myoview 12/17: EF 53, no ischemia, Low Risk  . HYPERLIPIDEMIA    myalgias from Lipitor  . HYPOGONADISM, MALE   . INSOMNIA-SLEEP DISORDER-UNSPEC   . OSTEOARTHRITIS   . SLEEP APNEA, OBSTRUCTIVE    no CPAP; repeat sleep test pending 07/2016  . Unspecified hypothyroidism 12/04/2013    Past Surgical History:  Procedure Laterality Date  . NASAL SEPTUM SURGERY    . TONSILLECTOMY    . UVULOPALATOPHARYNGOPLASTY    . VASECTOMY      Current Facility-Administered Medications  Medication Dose Route Frequency Provider Last Rate Last Dose  . 0.9 %  sodium chloride infusion  500 mL Intravenous Continuous Ladene Artist, MD       Current Outpatient Medications  Medication Sig Dispense Refill Last Dose  . ibuprofen (ADVIL,MOTRIN) 200 MG tablet Take 400 mg by mouth 2 (two) times daily as needed for headache or moderate pain.     Marland Kitchen levothyroxine (SYNTHROID, LEVOTHROID) 112 MCG tablet Take 100 mcg by mouth daily.      . pseudoephedrine (SUDAFED) 30 MG tablet Take 60 mg by mouth 2 (two) times daily as needed for congestion.      Allergies  Allergen Reactions  . Lipitor [Atorvastatin] Other (See Comments)    Leg cramps  . Metformin And Related Other (See Comments)    GI upset    Social History   Tobacco Use  . Smoking status: Never Smoker  . Smokeless tobacco: Never Used  Substance Use Topics  . Alcohol use: Yes    Comment: once every 22months    Family History  Problem Relation Age of Onset  .  Diabetes Maternal Uncle   . Heart attack Maternal Uncle   . Hypertension Unknown   . Prostate cancer Unknown   . Heart disease Unknown   . Heart attack Mother 77       s/p MI  . Other Mother        pacemaker  . Diabetes Mother   . Colon cancer Neg Hx      Review of Systems  Musculoskeletal: Positive for joint pain.       Right knee  All other systems reviewed and are negative.   Objective:  Physical Exam  Constitutional: He is  oriented to person, place, and time. He appears well-developed and well-nourished.  HENT:  Head: Normocephalic and atraumatic.  Eyes: Pupils are equal, round, and reactive to light.  Neck: Normal range of motion.  Cardiovascular: Normal rate and regular rhythm.  Respiratory: Effort normal.  GI: Soft.  Musculoskeletal:  Right knee range of motion is about 10-100.  He has medial joint line pain and crepitation.  I do not feel an effusion.  Hip motion is good and straight leg raise is negative.  Opposite knee has slightly better motion but also is painful to palpation.  He has normal unlabored respirations.  Sensation and motor function are intact in his feet with palpable pulses on both sides.   Neurological: He is alert and oriented to person, place, and time.  Skin: Skin is warm and dry.  Psychiatric: He has a normal mood and affect. His behavior is normal. Judgment and thought content normal.    Vital signs in last 24 hours: Temp:  [97.8 F (36.6 C)] 97.8 F (36.6 C) (02/06 1412) Pulse Rate:  [94] 94 (02/06 1412) Resp:  [20] 20 (02/06 1412) BP: (116)/(72) 116/72 (02/06 1412) SpO2:  [98 %] 98 % (02/06 1412) Weight:  [132 kg (290 lb 14.4 oz)] 132 kg (290 lb 14.4 oz) (02/06 1412)  Labs:   Estimated body mass index is 37.35 kg/m as calculated from the following:   Height as of 08/21/17: 6\' 2"  (1.88 m).   Weight as of 08/21/17: 132 kg (290 lb 14.4 oz).   Imaging Review Plain radiographs demonstrate severe degenerative joint disease of the right knee(s). The overall alignment isneutral. The bone quality appears to be good for age and reported activity level.  Assessment/Plan:  End stage primary arthritis, right knee   The patient history, physical examination, clinical judgment of the provider and imaging studies are consistent with end stage degenerative joint disease of the right knee(s) and total knee arthroplasty is deemed medically necessary. The treatment options including  medical management, injection therapy arthroscopy and arthroplasty were discussed at length. The risks and benefits of total knee arthroplasty were presented and reviewed. The risks due to aseptic loosening, infection, stiffness, patella tracking problems, thromboembolic complications and other imponderables were discussed. The patient acknowledged the explanation, agreed to proceed with the plan and consent was signed. Patient is being admitted for inpatient treatment for surgery, pain control, PT, OT, prophylactic antibiotics, VTE prophylaxis, progressive ambulation and ADL's and discharge planning. The patient is planning to be discharged home with home health services

## 2017-08-27 ENCOUNTER — Encounter (HOSPITAL_COMMUNITY): Admission: RE | Payer: Self-pay | Source: Ambulatory Visit

## 2017-08-27 ENCOUNTER — Inpatient Hospital Stay (HOSPITAL_COMMUNITY): Admission: RE | Admit: 2017-08-27 | Payer: Medicare Other | Source: Ambulatory Visit | Admitting: Orthopaedic Surgery

## 2017-08-27 SURGERY — ARTHROPLASTY, KNEE, TOTAL
Anesthesia: Spinal | Laterality: Right

## 2017-09-06 DIAGNOSIS — H2513 Age-related nuclear cataract, bilateral: Secondary | ICD-10-CM | POA: Diagnosis not present

## 2017-09-06 DIAGNOSIS — H524 Presbyopia: Secondary | ICD-10-CM | POA: Diagnosis not present

## 2017-09-06 DIAGNOSIS — H47233 Glaucomatous optic atrophy, bilateral: Secondary | ICD-10-CM | POA: Diagnosis not present

## 2017-09-06 DIAGNOSIS — H401322 Pigmentary glaucoma, left eye, moderate stage: Secondary | ICD-10-CM | POA: Diagnosis not present

## 2017-09-06 DIAGNOSIS — H401313 Pigmentary glaucoma, right eye, severe stage: Secondary | ICD-10-CM | POA: Diagnosis not present

## 2017-09-27 DIAGNOSIS — H401322 Pigmentary glaucoma, left eye, moderate stage: Secondary | ICD-10-CM | POA: Diagnosis not present

## 2017-09-27 DIAGNOSIS — H401313 Pigmentary glaucoma, right eye, severe stage: Secondary | ICD-10-CM | POA: Diagnosis not present

## 2017-09-27 DIAGNOSIS — H47233 Glaucomatous optic atrophy, bilateral: Secondary | ICD-10-CM | POA: Diagnosis not present

## 2017-09-27 DIAGNOSIS — E119 Type 2 diabetes mellitus without complications: Secondary | ICD-10-CM | POA: Diagnosis not present

## 2017-09-27 DIAGNOSIS — H2513 Age-related nuclear cataract, bilateral: Secondary | ICD-10-CM | POA: Diagnosis not present

## 2017-10-21 DIAGNOSIS — H2513 Age-related nuclear cataract, bilateral: Secondary | ICD-10-CM | POA: Diagnosis not present

## 2017-10-21 DIAGNOSIS — H401313 Pigmentary glaucoma, right eye, severe stage: Secondary | ICD-10-CM | POA: Diagnosis not present

## 2017-10-21 DIAGNOSIS — H0012 Chalazion right lower eyelid: Secondary | ICD-10-CM | POA: Diagnosis not present

## 2017-10-21 DIAGNOSIS — H401322 Pigmentary glaucoma, left eye, moderate stage: Secondary | ICD-10-CM | POA: Diagnosis not present

## 2017-11-04 DIAGNOSIS — M1711 Unilateral primary osteoarthritis, right knee: Secondary | ICD-10-CM | POA: Diagnosis not present

## 2017-11-04 DIAGNOSIS — E559 Vitamin D deficiency, unspecified: Secondary | ICD-10-CM | POA: Diagnosis not present

## 2017-11-04 DIAGNOSIS — E1165 Type 2 diabetes mellitus with hyperglycemia: Secondary | ICD-10-CM | POA: Diagnosis not present

## 2017-11-04 DIAGNOSIS — E039 Hypothyroidism, unspecified: Secondary | ICD-10-CM | POA: Diagnosis not present

## 2017-12-31 DIAGNOSIS — H01002 Unspecified blepharitis right lower eyelid: Secondary | ICD-10-CM | POA: Diagnosis not present

## 2017-12-31 DIAGNOSIS — H401313 Pigmentary glaucoma, right eye, severe stage: Secondary | ICD-10-CM | POA: Diagnosis not present

## 2017-12-31 DIAGNOSIS — H01001 Unspecified blepharitis right upper eyelid: Secondary | ICD-10-CM | POA: Diagnosis not present

## 2017-12-31 DIAGNOSIS — H401322 Pigmentary glaucoma, left eye, moderate stage: Secondary | ICD-10-CM | POA: Diagnosis not present

## 2018-04-07 DIAGNOSIS — E039 Hypothyroidism, unspecified: Secondary | ICD-10-CM | POA: Diagnosis not present

## 2018-04-07 DIAGNOSIS — M25561 Pain in right knee: Secondary | ICD-10-CM

## 2018-04-07 DIAGNOSIS — E291 Testicular hypofunction: Secondary | ICD-10-CM

## 2018-04-07 DIAGNOSIS — Z7189 Other specified counseling: Secondary | ICD-10-CM

## 2018-04-07 DIAGNOSIS — E1169 Type 2 diabetes mellitus with other specified complication: Secondary | ICD-10-CM | POA: Diagnosis not present

## 2018-04-07 DIAGNOSIS — E559 Vitamin D deficiency, unspecified: Secondary | ICD-10-CM

## 2018-04-07 DIAGNOSIS — Z6841 Body Mass Index (BMI) 40.0 and over, adult: Secondary | ICD-10-CM

## 2018-04-07 DIAGNOSIS — Z0000 Encounter for general adult medical examination without abnormal findings: Secondary | ICD-10-CM | POA: Diagnosis not present

## 2018-04-07 DIAGNOSIS — R0683 Snoring: Secondary | ICD-10-CM

## 2018-05-13 ENCOUNTER — Encounter (INDEPENDENT_AMBULATORY_CARE_PROVIDER_SITE_OTHER): Payer: Medicare Other

## 2018-05-19 ENCOUNTER — Ambulatory Visit (INDEPENDENT_AMBULATORY_CARE_PROVIDER_SITE_OTHER): Payer: Medicare Other | Admitting: Bariatrics

## 2018-05-19 ENCOUNTER — Encounter (INDEPENDENT_AMBULATORY_CARE_PROVIDER_SITE_OTHER): Payer: Self-pay | Admitting: Bariatrics

## 2018-05-19 VITALS — BP 133/87 | HR 78 | Temp 98.0°F | Ht 72.0 in | Wt 295.4 lb

## 2018-05-19 DIAGNOSIS — Z1331 Encounter for screening for depression: Secondary | ICD-10-CM

## 2018-05-19 DIAGNOSIS — R5383 Other fatigue: Secondary | ICD-10-CM | POA: Diagnosis not present

## 2018-05-19 DIAGNOSIS — H409 Unspecified glaucoma: Secondary | ICD-10-CM | POA: Insufficient documentation

## 2018-05-19 DIAGNOSIS — Z0289 Encounter for other administrative examinations: Secondary | ICD-10-CM

## 2018-05-19 DIAGNOSIS — Z6841 Body Mass Index (BMI) 40.0 and over, adult: Secondary | ICD-10-CM | POA: Diagnosis not present

## 2018-05-19 DIAGNOSIS — E1165 Type 2 diabetes mellitus with hyperglycemia: Secondary | ICD-10-CM | POA: Insufficient documentation

## 2018-05-19 DIAGNOSIS — G4733 Obstructive sleep apnea (adult) (pediatric): Secondary | ICD-10-CM

## 2018-05-19 DIAGNOSIS — R7303 Prediabetes: Secondary | ICD-10-CM

## 2018-05-19 DIAGNOSIS — R0602 Shortness of breath: Secondary | ICD-10-CM

## 2018-05-19 DIAGNOSIS — E559 Vitamin D deficiency, unspecified: Secondary | ICD-10-CM

## 2018-05-19 DIAGNOSIS — I951 Orthostatic hypotension: Secondary | ICD-10-CM | POA: Insufficient documentation

## 2018-05-19 DIAGNOSIS — E66812 Obesity, class 2: Secondary | ICD-10-CM | POA: Insufficient documentation

## 2018-05-19 NOTE — Progress Notes (Signed)
Office: [phone]  /  Fax: [phone]   Dear Dr. Baird Cancer,   Thank you for referring Connor Becker. to our clinic. The following note includes my evaluation and treatment recommendations.  HPI:   Chief Complaint: OBESITY    Connor Esco. has been referred by Glendale Chard, MD for consultation regarding his obesity and obesity related comorbidities.    Connor Becker (MR# [id number]) is a 67 y.o. male who presents on 05/19/2018 for obesity evaluation and treatment. Current BMI is Body mass index is 40.06 kg/m.Connor Becker has been struggling with his weight for many years and has been unsuccessful in either losing weight, maintaining weight loss, or reaching his healthy weight goal.     Connor Becker attended our information session and states he is currently in the action stage of change and ready to dedicate time achieving and maintaining a healthier weight. Brainard is interested in becoming our patient and working on intensive lifestyle modifications including (but not limited to) diet, exercise and weight loss.    Connor Becker states his family eats meals together his desired weight loss is 88 lbs he started gaining weight in her thirties his heaviest weight ever was 300 lbs. he considers himself to be a picky eater he has significant food cravings for "red meats" and "carbohydrates" he snacks frequently in the evenings he skips lunch frequently he is frequently drinking liquids with calories he frequently makes poor food choices he frequently eats larger portions than normal  he struggles with emotional eating    Fatigue Connor Becker feels his energy is lower than it should be. This has worsened with weight gain and has not worsened recently. Connor Becker admits to daytime somnolence and admits to waking up still tired. Patient is at risk for obstructive sleep apnea. Patent has a history of symptoms of daytime fatigue, morning fatigue and morning headache. Patient generally gets 6 hours of  sleep per night, and states they generally have restless sleep. Snoring is present. Apneic episodes are not present. Epworth Sleepiness Score is 9  Dyspnea on exertion Connor Becker notes increasing shortness of breath with exercising and seems to be worsening over time with weight gain. He notes getting out of breath sooner with certain activities (exertion and climbing stairs) than he used to. This has not gotten worse recently. Younis denies orthopnea.  Pre-Diabetes Taysom has a diagnosis of prediabetes based on his elevated Hgb A1c in the past and was informed this puts him at greater risk of developing diabetes. Deonte has a family history of diabetes. He has tried metformin in the past and had diarrhea and cramps. Castle is attempting to work on diet and exercise to decrease risk of diabetes.   Sleep Apnea Connor Becker has an Epworth Sleepiness Scale of 9. He is having a sleep study in the near future. Givanni is positive for snoring and he had tonsil surgery in 2003.  Vitamin D deficiency Tivis has a diagnosis of vitamin D deficiency. He is currently taking multi vitamin and denies nausea, vomiting or muscle weakness.  Depression Screen Connor Becker's Food and Mood (modified PHQ-9) score was  Depression screen PHQ 2/9 05/19/2018  Decreased Interest 3  Down, Depressed, Hopeless 1  PHQ - 2 Score 4  Altered sleeping 3  Tired, decreased energy 3  Change in appetite 3  Trouble concentrating 1  Moving slowly or fidgety/restless 3  Suicidal thoughts 0  PHQ-9 Score 17  Difficult doing work/chores Very difficult    ALLERGIES: Allergies  Allergen Reactions  .  Horseradish [Armoracia Rusticana Ext (Horseradish)]   . Lipitor [Atorvastatin] Other (See Comments)    Leg cramps  . Metformin And Related Other (See Comments)    GI upset  . Soy Allergy     MEDICATIONS: Current Outpatient Medications on File Prior to Visit  Medication Sig Dispense Refill  . aspirin 325 MG tablet Take 325 mg by mouth daily.    .  diphenhydrAMINE (BENADRYL) 25 MG tablet Take 25 mg by mouth every 8 (eight) hours as needed.    Marland Kitchen ibuprofen (ADVIL,MOTRIN) 200 MG tablet Take 400 mg by mouth 2 (two) times daily as needed for headache or moderate pain.    Marland Kitchen latanoprost (XALATAN) 0.005 % ophthalmic solution 1 drop at bedtime.    Marland Kitchen levothyroxine (SYNTHROID, LEVOTHROID) 112 MCG tablet Take 100 mcg by mouth daily.     . methocarbamol (ROBAXIN) 750 MG tablet Take 750 mg by mouth 4 (four) times daily.    . Misc Natural Products (TUMERSAID PO) Take by mouth.    . Multiple Vitamins-Minerals (MULTIVITAMIN/EXTRA VITAMIN D3) CHEW Chew by mouth.    Marland Kitchen omeprazole (PRILOSEC) 20 MG capsule Take 20 mg by mouth daily.    . pseudoephedrine (SUDAFED) 30 MG tablet Take 60 mg by mouth 2 (two) times daily as needed for congestion.    . timolol (TIMOPTIC-XR) 0.25 % ophthalmic gel-forming 1 drop daily.    . Turmeric 500 MG CAPS Take by mouth.    . vitamin C (ASCORBIC ACID) 500 MG tablet Take 1,000 mg by mouth daily.     Current Facility-Administered Medications on File Prior to Visit  Medication Dose Route Frequency Provider Last Rate Last Dose  . 0.9 %  sodium chloride infusion  500 mL Intravenous Continuous Ladene Artist, MD        PAST MEDICAL HISTORY: Past Medical History:  Diagnosis Date  . ALLERGIC RHINITIS   . Chronic pain of both knees   . DJD (degenerative joint disease) of knee    right  . DM2 (diabetes mellitus, type 2) ([place])   . GERD   . Glaucoma   . History of nuclear stress test    Myoview 12/17: EF 53, no ischemia, Low Risk  . HYPERLIPIDEMIA    myalgias from Lipitor  . HYPOGONADISM, MALE   . Hypothyroidism   . INSOMNIA-SLEEP DISORDER-UNSPEC   . Joint pain   . Obesity   . OSTEOARTHRITIS   . SLEEP APNEA, OBSTRUCTIVE    no CPAP; repeat sleep test pending 07/2016  . Unspecified hypothyroidism 12/04/2013  . Vitamin D deficiency     PAST SURGICAL HISTORY: Past Surgical History:  Procedure Laterality Date  . NASAL  SEPTUM SURGERY    . TONSILLECTOMY    . UVULOPALATOPHARYNGOPLASTY    . VASECTOMY      SOCIAL HISTORY: Social History   Tobacco Use  . Smoking status: Never Smoker  . Smokeless tobacco: Never Used  Substance Use Topics  . Alcohol use: Yes    Comment: once every 18months  . Drug use: No    FAMILY HISTORY: Family History  Problem Relation Age of Onset  . Diabetes Maternal Uncle   . Heart attack Maternal Uncle   . Hypertension Unknown   . Prostate cancer Unknown   . Heart disease Unknown   . Heart attack Mother 8       s/p MI  . Other Mother        pacemaker  . Diabetes Mother   . High blood pressure Mother   .  High Cholesterol Mother   . Thyroid disease Mother   . Obesity Mother   . Thyroid disease Father   . Colon cancer Neg Hx     ROS: Review of Systems  Constitutional: Positive for malaise/fatigue.  HENT: Positive for sinus pain.   Eyes:       + Wear Glasses or Contacts + Floaters  Respiratory: Positive for shortness of breath (on exertion).   Cardiovascular: Negative for orthopnea.  Gastrointestinal: Negative for nausea and vomiting.  Genitourinary: Positive for frequency.  Musculoskeletal:       + Muscle or Joint Pain Negative for muscle weakness  Neurological: Positive for dizziness.    PHYSICAL EXAM: Blood pressure 133/87, pulse 78, temperature 98 F (36.7 C), temperature source Oral, height 6' (1.829 m), weight 295 lb 6.4 oz (134 kg), SpO2 97 %. Body mass index is 40.06 kg/m. Physical Exam  Constitutional: He is oriented to person, place, and time. He appears well-developed and well-nourished.  HENT:  Head: Normocephalic and atraumatic.  Nose: Nose normal.  + Mallanpati = 4  Eyes: EOM are normal.  Neck: Normal range of motion. Neck supple. No thyromegaly present.  Cardiovascular: Normal rate and regular rhythm.  Pulmonary/Chest: Effort normal. No respiratory distress.  Abdominal: Soft. There is no tenderness.  + Obesity  Musculoskeletal:  Normal range of motion. He exhibits edema (trace edema bilateral lower extremities).  Range of Motion normal in all 4 extremities  Neurological: He is alert and oriented to person, place, and time.  Skin: Skin is warm and dry.  Psychiatric: He has a normal mood and affect. His behavior is normal.  Vitals reviewed.   RECENT LABS AND TESTS: BMET    Component Value Date/Time   NA 138 08/21/2017 1438   K 4.6 08/21/2017 1438   CL 101 08/21/2017 1438   CO2 25 08/21/2017 1438   GLUCOSE 123 (H) 08/21/2017 1438   BUN 21 (H) 08/21/2017 1438   CREATININE 1.00 08/21/2017 1438   CALCIUM 9.5 08/21/2017 1438   GFRNONAA >60 08/21/2017 1438   GFRAA >60 08/21/2017 1438   Lab Results  Component Value Date   HGBA1C 6.9 03/30/2016   No results found for: INSULIN CBC    Component Value Date/Time   WBC 9.6 08/21/2017 1438   RBC 5.04 08/21/2017 1438   HGB 15.6 08/21/2017 1438   HCT 46.4 08/21/2017 1438   PLT 232 08/21/2017 1438   MCV 92.1 08/21/2017 1438   MCH 31.0 08/21/2017 1438   MCHC 33.6 08/21/2017 1438   RDW 13.1 08/21/2017 1438   LYMPHSABS 2.8 08/21/2017 1438   MONOABS 0.8 08/21/2017 1438   EOSABS 0.2 08/21/2017 1438   BASOSABS 0.0 08/21/2017 1438   Iron/TIBC/Ferritin/ %Sat No results found for: IRON, TIBC, FERRITIN, IRONPCTSAT Lipid Panel     Component Value Date/Time   CHOL 208 (H) 11/17/2015 1428   TRIG 300.0 (H) 11/17/2015 1428   HDL 43.70 11/17/2015 1428   CHOLHDL 5 11/17/2015 1428   VLDL 60.0 (H) 11/17/2015 1428   LDLCALC 131 (H) 12/04/2013 0904   LDLDIRECT 148.0 11/17/2015 1428   Hepatic Function Panel     Component Value Date/Time   PROT 6.9 04/06/2016 1638   ALBUMIN 3.8 04/06/2016 1638   AST 21 04/06/2016 1638   ALT 24 04/06/2016 1638   ALKPHOS 61 04/06/2016 1638   BILITOT 0.5 04/06/2016 1638   BILIDIR 0.0 04/06/2016 1638      Component Value Date/Time   TSH 3.17 04/06/2016 1638   TSH 9.51 (  H) 02/01/2016 1531   TSH 3.58 11/17/2015 1428    ECG  shows  NSR with a rate of 73 BPM INDIRECT CALORIMETER done today shows a VO2 of 357 and a REE of 2487.  His calculated basal metabolic rate is 5956 thus his basal metabolic rate is worse than expected.    ASSESSMENT AND PLAN: Other fatigue - Plan: EKG 12-Lead  Shortness of breath on exertion  Obstructive sleep apnea syndrome  Vitamin D deficiency - Plan: VITAMIN D 25 Hydroxy (Vit-D Deficiency, Fractures)  Prediabetes - Plan: Insulin, random, Comprehensive metabolic panel  Depression screening  Class 3 severe obesity with serious comorbidity and body mass index (BMI) of 40.0 to 44.9 in adult, unspecified obesity type ([place])  PLAN: Fatigue Nolon was informed that his fatigue may be related to obesity, depression or many other causes. Labs will be ordered, and in the meanwhile Connor Becker has agreed to work on diet, exercise and weight loss to help with fatigue. Proper sleep hygiene was discussed including the need for 7-8 hours of quality sleep each night. A sleep study was not ordered based on symptoms and Epworth score.  Dyspnea on exertion Connor Becker's shortness of breath appears to be obesity related and exercise induced. He has agreed to work on weight loss and gradually increase exercise (cardio and resistance) to treat his exercise induced shortness of breath. If Connor Becker follows our instructions and loses weight without improvement of his shortness of breath, we will plan to refer to pulmonology. We will monitor this condition regularly. Maanav agrees to this plan.  Pre-Diabetes Yaxiel will continue to work on weight loss, exercise, and decreasing simple carbohydrates in his diet to help decrease the risk of diabetes. He was informed that eating too many simple carbohydrates or too many calories at one sitting increases the likelihood of GI side effects. Connor Becker will record his fasting blood sugar and 2 hour post prandial blood sugar and follow up with [place] as directed to monitor his progress.  Sleep  Apnea Connor Becker will follow up with a new sleep study this week. Connor Becker will start to work on diet and weight loss and will follow up with our clinic in 2 weeks.  Vitamin D Deficiency Connor Becker was informed that low vitamin D levels contributes to fatigue and are associated with obesity, breast, and colon cancer. He will continue to take multi vitamin and will follow up for routine testing of vitamin D, at least 2-3 times per year. He was informed of the risk of over-replacement of vitamin D and agrees to not increase his dose unless he discusses this with [place] first. We will check vitamin D level today and Connor Becker will follow up with our clinic in 2 weeks.  Depression Screen Connor Becker had a strongly positive depression screening. Depression is commonly associated with obesity and often results in emotional eating behaviors. We will monitor this closely and work on CBT to help improve the non-hunger eating patterns. Referral to Psychology may be required if no improvement is seen as he continues in our clinic.  Obesity Connor Becker is currently in the action stage of change and his goal is to continue with weight loss efforts. I recommend Connor Becker begin the structured treatment plan as follows:  He has agreed to follow the Category 4 plan Connor Becker has been instructed to eventually work up to a goal of 150 minutes of combined cardio and strengthening exercise per week for weight loss and overall health benefits. We discussed the following Behavioral Modification Strategies today: increase  H2O intake, no skipping meals, keeping healthy foods in the home, decrease red meats, increasing lean protein intake, decreasing simple carbohydrates , increasing vegetables, decrease eating out and work on meal planning and easy cooking plans   He was informed of the importance of frequent follow up visits to maximize his success with intensive lifestyle modifications for his multiple health conditions. He was informed we would discuss his lab  results at his next visit unless there is a critical issue that needs to be addressed sooner. Connor Becker agreed to keep his next visit at the agreed upon time to discuss these results.    OBESITY BEHAVIORAL INTERVENTION VISIT  Today's visit was # 1   Starting weight: 295 lbs Starting date: 05/19/18 Today's weight : 295 lbs  Today's date: 05/19/2018 Total lbs lost to date: 0 At least 15 minutes were spent on discussing the following behavioral intervention visit.   ASK: We discussed the diagnosis of obesity with Connor Becker today and Lue agreed to give [place] permission to discuss obesity behavioral modification therapy today.  ASSESS: Connor Becker has the diagnosis of obesity and his BMI today is 40.05 Connor Becker is in the action stage of change   ADVISE: Connor Becker was educated on the multiple health risks of obesity as well as the benefit of weight loss to improve his health. He was advised of the need for long term treatment and the importance of lifestyle modifications to improve his current health and to decrease his risk of future health problems.  AGREE: Multiple dietary modification options and treatment options were discussed and  Connor Becker agreed to follow the recommendations documented in the above note.  ARRANGE: Connor Becker was educated on the importance of frequent visits to treat obesity as outlined per CMS and USPSTF guidelines and agreed to schedule his next follow up appointment today.  Corey Skains, am acting as Location manager for [employer]. Owens Shark, DO  I have reviewed the above documentation for accuracy and completeness, and I agree with the above. -Jearld Lesch, DO

## 2018-05-20 ENCOUNTER — Encounter: Payer: Self-pay | Admitting: Neurology

## 2018-05-20 ENCOUNTER — Institutional Professional Consult (permissible substitution): Payer: Medicare Other | Admitting: Neurology

## 2018-05-20 ENCOUNTER — Telehealth: Payer: Self-pay | Admitting: Neurology

## 2018-05-20 LAB — COMPREHENSIVE METABOLIC PANEL
ALK PHOS: 80 IU/L (ref 39–117)
ALT: 22 IU/L (ref 0–44)
AST: 21 IU/L (ref 0–40)
Albumin/Globulin Ratio: 1.5 (ref 1.2–2.2)
Albumin: 4.4 g/dL (ref 3.6–4.8)
BUN/Creatinine Ratio: 19 (ref 10–24)
BUN: 19 mg/dL (ref 8–27)
Bilirubin Total: 0.5 mg/dL (ref 0.0–1.2)
CHLORIDE: 101 mmol/L (ref 96–106)
CO2: 19 mmol/L — AB (ref 20–29)
CREATININE: 1.02 mg/dL (ref 0.76–1.27)
Calcium: 10 mg/dL (ref 8.6–10.2)
GFR calc Af Amer: 86 mL/min/{1.73_m2} (ref >59)
GFR calc non Af Amer: 75 mL/min/{1.73_m2} (ref >59)
GLOBULIN, TOTAL: 3 g/dL (ref 1.5–4.5)
GLUCOSE: 111 mg/dL — AB (ref 65–99)
Potassium: 4.4 mmol/L (ref 3.5–5.2)
SODIUM: 138 mmol/L (ref 134–144)
Total Protein: 7.4 g/dL (ref 6.0–8.5)

## 2018-05-20 LAB — INSULIN, RANDOM: INSULIN: 25.8 u[IU]/mL — AB (ref 2.6–24.9)

## 2018-05-20 LAB — VITAMIN D 25 HYDROXY (VIT D DEFICIENCY, FRACTURES): Vit D, 25-Hydroxy: 26.4 ng/mL — ABNORMAL LOW (ref 30.0–100.0)

## 2018-05-20 NOTE — Telephone Encounter (Signed)
Patient wife called and cancelled his apt for today. Stating the patient is sick. No show for sleep consult today

## 2018-06-02 ENCOUNTER — Encounter (INDEPENDENT_AMBULATORY_CARE_PROVIDER_SITE_OTHER): Payer: Self-pay

## 2018-06-02 ENCOUNTER — Ambulatory Visit (INDEPENDENT_AMBULATORY_CARE_PROVIDER_SITE_OTHER): Payer: Medicare Other | Admitting: Bariatrics

## 2018-06-03 ENCOUNTER — Other Ambulatory Visit: Payer: Self-pay | Admitting: Internal Medicine

## 2018-06-04 ENCOUNTER — Ambulatory Visit (INDEPENDENT_AMBULATORY_CARE_PROVIDER_SITE_OTHER): Payer: Medicare Other | Admitting: Family Medicine

## 2018-06-04 VITALS — BP 133/87 | HR 68 | Temp 98.1°F | Ht 72.0 in | Wt 300.0 lb

## 2018-06-04 DIAGNOSIS — E559 Vitamin D deficiency, unspecified: Secondary | ICD-10-CM

## 2018-06-04 DIAGNOSIS — E1165 Type 2 diabetes mellitus with hyperglycemia: Secondary | ICD-10-CM

## 2018-06-04 DIAGNOSIS — Z6841 Body Mass Index (BMI) 40.0 and over, adult: Secondary | ICD-10-CM

## 2018-06-04 MED ORDER — ROSUVASTATIN CALCIUM 5 MG PO TABS: 5.0000 mg | ORAL_TABLET | Freq: Every day | ORAL | 3 refills | Status: DC

## 2018-06-04 MED ORDER — LISINOPRIL 5 MG PO TABS
5.0000 mg | ORAL_TABLET | Freq: Every day | ORAL | 0 refills | Status: DC
Start: 2018-06-04 — End: 2018-06-25

## 2018-06-04 MED ORDER — METFORMIN HCL 500 MG PO TABS: 500.0000 mg | ORAL_TABLET | Freq: Every day | ORAL | 0 refills | Status: DC

## 2018-06-10 NOTE — Progress Notes (Signed)
Office: [phone]  /  Fax: [phone]   HPI:   Chief Complaint: OBESITY Connor Becker is here to discuss his progress with his obesity treatment plan. He is on the Category 4 plan and is following his eating plan approximately 0 % of the time. He states he is exercising 0 minutes 0 times per week. Connor Becker is out of town working at his fathers house helping to build a deck. He didn't follow the plan at all.  His weight is 300 lb (136.1 kg) today and has had a weight loss of 5 pounds over a period of 2 weeks since his last visit. He has lost 0 lbs since starting treatment with [place].  Vitamin D deficiency Connor Becker has a diagnosis of vitamin D deficiency. He is not taking OTC vit D. He admits fatigue and denies nausea, vomiting, or muscle weakness.  Diabetes II with Hyperglycemia Connor Becker has a diagnosis of diabetes type II. Daaiel has had no diabetes for at least 5 years.is not on medications. He reports that he believes he tried metformin, but had leg cramps. He admits to carb cravings. He has been working on intensive lifestyle modifications including diet, exercise, and weight loss to help control his blood glucose levels.  ALLERGIES: Allergies  Allergen Reactions  . Horseradish [Armoracia Rusticana Ext (Horseradish)]   . Lipitor [Atorvastatin] Other (See Comments)    Leg cramps  . Metformin And Related Other (See Comments)    GI upset  . Rye Grass Flower Pollen Extract [Gramineae Pollens]   . Soy Allergy     MEDICATIONS: Current Outpatient Medications on File Prior to Visit  Medication Sig Dispense Refill  . aspirin 325 MG tablet Take 325 mg by mouth daily.    . diphenhydrAMINE (BENADRYL) 25 MG tablet Take 25 mg by mouth every 8 (eight) hours as needed.    Marland Kitchen ibuprofen (ADVIL,MOTRIN) 200 MG tablet Take 400 mg by mouth 2 (two) times daily as needed for headache or moderate pain.    Marland Kitchen latanoprost (XALATAN) 0.005 % ophthalmic solution 1 drop at bedtime.    Marland Kitchen levothyroxine (SYNTHROID,  LEVOTHROID) 112 MCG tablet TAKE 1 TABLET BY MOUTH ONCE DAILY 90 tablet 0  . methocarbamol (ROBAXIN) 750 MG tablet Take 750 mg by mouth 4 (four) times daily.    . Misc Natural Products (TUMERSAID PO) Take by mouth.    . Multiple Vitamins-Minerals (MULTIVITAMIN/EXTRA VITAMIN D3) CHEW Chew by mouth.    Marland Kitchen omeprazole (PRILOSEC) 20 MG capsule Take 20 mg by mouth daily.    Glory Rosebush VERIO test strip USE STRIP TO CHECK BLOOD SUGAR UP TO 4 TIMES PER DAY 200 each 11  . pseudoephedrine (SUDAFED) 30 MG tablet Take 60 mg by mouth 2 (two) times daily as needed for congestion.    . timolol (TIMOPTIC-XR) 0.25 % ophthalmic gel-forming 1 drop daily.    . Turmeric 500 MG CAPS Take by mouth.    . vitamin C (ASCORBIC ACID) 500 MG tablet Take 1,000 mg by mouth daily.     Current Facility-Administered Medications on File Prior to Visit  Medication Dose Route Frequency Provider Last Rate Last Dose  . 0.9 %  sodium chloride infusion  500 mL Intravenous Continuous Ladene Artist, MD        PAST MEDICAL HISTORY: Past Medical History:  Diagnosis Date  . ALLERGIC RHINITIS   . Chronic pain of both knees   . DJD (degenerative joint disease) of knee    right  . DM2 (diabetes mellitus, type  2) ([place])   . GERD   . Glaucoma   . History of nuclear stress test    Myoview 12/17: EF 53, no ischemia, Low Risk  . HYPERLIPIDEMIA    myalgias from Lipitor  . HYPOGONADISM, MALE   . Hypothyroidism   . INSOMNIA-SLEEP DISORDER-UNSPEC   . Joint pain   . Obesity   . OSTEOARTHRITIS   . SLEEP APNEA, OBSTRUCTIVE    no CPAP; repeat sleep test pending 07/2016  . Unspecified hypothyroidism 12/04/2013  . Vitamin D deficiency     PAST SURGICAL HISTORY: Past Surgical History:  Procedure Laterality Date  . NASAL SEPTUM SURGERY    . TONSILLECTOMY    . UVULOPALATOPHARYNGOPLASTY    . VASECTOMY      SOCIAL HISTORY: Social History   Tobacco Use  . Smoking status: Never Smoker  . Smokeless tobacco: Never Used  Substance Use  Topics  . Alcohol use: Yes    Comment: once every 89months  . Drug use: No    FAMILY HISTORY: Family History  Problem Relation Age of Onset  . Diabetes Maternal Uncle   . Heart attack Maternal Uncle   . Hypertension Unknown   . Prostate cancer Unknown   . Heart disease Unknown   . Heart attack Mother 41       s/p MI  . Other Mother        pacemaker  . Diabetes Mother   . High blood pressure Mother   . High Cholesterol Mother   . Thyroid disease Mother   . Obesity Mother   . Thyroid disease Father   . Colon cancer Neg Hx     ROS: Review of Systems  Constitutional: Positive for malaise/fatigue. Negative for weight loss.  Gastrointestinal: Negative for nausea and vomiting.  Musculoskeletal:       Negative for muscle weakness.  Endo/Heme/Allergies:       Positive for hyperglycemia.    PHYSICAL EXAM: Blood pressure 133/87, pulse 68, temperature 98.1 F (36.7 C), temperature source Oral, height 6' (1.829 m), weight 300 lb (136.1 kg), SpO2 97 %. Body mass index is 40.69 kg/m. Physical Exam  Constitutional: He is oriented to person, place, and time. He appears well-developed and well-nourished.  Cardiovascular: Normal rate.  Pulmonary/Chest: Effort normal.  Musculoskeletal: Normal range of motion.  Neurological: He is oriented to person, place, and time.  Skin: Skin is warm and dry.  Psychiatric: He has a normal mood and affect. His behavior is normal.  Vitals reviewed.   RECENT LABS AND TESTS: BMET    Component Value Date/Time   NA 138 05/19/2018 1218   K 4.4 05/19/2018 1218   CL 101 05/19/2018 1218   CO2 19 (L) 05/19/2018 1218   GLUCOSE 111 (H) 05/19/2018 1218   GLUCOSE 123 (H) 08/21/2017 1438   BUN 19 05/19/2018 1218   CREATININE 1.02 05/19/2018 1218   CALCIUM 10.0 05/19/2018 1218   GFRNONAA 75 05/19/2018 1218   GFRAA 86 05/19/2018 1218   Lab Results  Component Value Date   HGBA1C 6.9 03/30/2016   HGBA1C 7.0 (H) 11/17/2015   HGBA1C 6.8 (H) 04/14/2015    HGBA1C 6.9 (H) 12/04/2013   HGBA1C 6.7 (H) 09/18/2012   Lab Results  Component Value Date   INSULIN 25.8 (H) 05/19/2018   CBC    Component Value Date/Time   WBC 9.6 08/21/2017 1438   RBC 5.04 08/21/2017 1438   HGB 15.6 08/21/2017 1438   HCT 46.4 08/21/2017 1438   PLT 232 08/21/2017  1438   MCV 92.1 08/21/2017 1438   MCH 31.0 08/21/2017 1438   MCHC 33.6 08/21/2017 1438   RDW 13.1 08/21/2017 1438   LYMPHSABS 2.8 08/21/2017 1438   MONOABS 0.8 08/21/2017 1438   EOSABS 0.2 08/21/2017 1438   BASOSABS 0.0 08/21/2017 1438   Iron/TIBC/Ferritin/ %Sat No results found for: IRON, TIBC, FERRITIN, IRONPCTSAT Lipid Panel     Component Value Date/Time   CHOL 208 (H) 11/17/2015 1428   TRIG 300.0 (H) 11/17/2015 1428   HDL 43.70 11/17/2015 1428   CHOLHDL 5 11/17/2015 1428   VLDL 60.0 (H) 11/17/2015 1428   LDLCALC 131 (H) 12/04/2013 0904   LDLDIRECT 148.0 11/17/2015 1428   Hepatic Function Panel     Component Value Date/Time   PROT 7.4 05/19/2018 1218   ALBUMIN 4.4 05/19/2018 1218   AST 21 05/19/2018 1218   ALT 22 05/19/2018 1218   ALKPHOS 80 05/19/2018 1218   BILITOT 0.5 05/19/2018 1218   BILIDIR 0.0 04/06/2016 1638      Component Value Date/Time   TSH 3.17 04/06/2016 1638   TSH 9.51 (H) 02/01/2016 1531   TSH 3.58 11/17/2015 1428   Results for GRAVES, NIPP (MRN [id number]) as of 06/10/2018 12:43  Ref. Range 05/19/2018 12:18  Vitamin D, 25-Hydroxy Latest Ref Range: 30.0 - 100.0 ng/mL 26.4 (L)   ASSESSMENT AND PLAN: Vitamin D deficiency  Type 2 diabetes mellitus with hyperglycemia, without long-term current use of insulin (HCC) - Plan: lisinopril (PRINIVIL,ZESTRIL) 5 MG tablet, metFORMIN (GLUCOPHAGE) 500 MG tablet, rosuvastatin (CRESTOR) 5 MG tablet  Class 3 severe obesity with serious comorbidity and body mass index (BMI) of 40.0 to 44.9 in adult, unspecified obesity type ([place])  PLAN:  Vitamin D Deficiency [place] was informed that low vitamin D levels  contributes to fatigue and are associated with obesity, breast, and colon cancer. He agrees to continue to take prescription Vit D @50 ,000 IU every week #4 with no refills and will follow up for routine testing of vitamin D, at least 2-3 times per year. He was informed of the risk of over-replacement of vitamin D and agrees to not increase his dose unless he discusses this with [place] first. Connor Becker agrees to follow up as directed in 2 weeks.  Diabetes II with Hyperglycemia Connor Becker has been given extensive diabetes education by myself today including ideal fasting and post-prandial blood glucose readings, individual ideal Hgb A1c goals, and hypoglycemia prevention. We discussed the importance of good blood sugar control to decrease the likelihood of diabetic complications such as nephropathy, neuropathy, limb loss, blindness, coronary artery disease, and death. We discussed the importance of intensive lifestyle modification including diet, exercise and weight loss as the first line treatment for diabetes. Connor Becker agrees to continue lisinopril 5mg  PO qAM #30 with no refills, metformin 500mg  PO qAM #30 with no refills, and Crestor 5mg  PO qPM #30 with no refills and will follow up at the agreed upon time.  Obesity Connor Becker is currently in the action stage of change. As such, his goal is to continue with weight loss efforts. He has agreed to follow the Category 4 plan. Connor Becker has been instructed to work up to a goal of 150 minutes of combined cardio and strengthening exercise per week for weight loss and overall health benefits. We discussed the following Behavioral Modification Strategies today: increasing lean protein intake, increasing vegetables, work on meal planning and easy cooking plans, and travel eating strategies.  Connor Becker has agreed to follow up with our clinic in  2 weeks. He was informed of the importance of frequent follow up visits to maximize his success with intensive lifestyle modifications for his  multiple health conditions.   OBESITY BEHAVIORAL INTERVENTION VISIT  Today's visit was # 2   Starting weight: 295 lbs Starting date: 05/19/18 Today's weight : Weight: 300 lb (136.1 kg)  Today's date: 06/04/2018 Total lbs lost to date: 0 At least 15 minutes were spent on discussing the following behavioral intervention visit.   ASK: We discussed the diagnosis of obesity with Connor Becker today and Connor Becker agreed to give [place] permission to discuss obesity behavioral modification therapy today.  ASSESS: Connor Becker has the diagnosis of obesity and his BMI today is 40.68. Connor Becker is in the action stage of change.   ADVISE: Connor Becker was educated on the multiple health risks of obesity as well as the benefit of weight loss to improve his health. He was advised of the need for long term treatment and the importance of lifestyle modifications to improve his current health and to decrease his risk of future health problems.  AGREE: Multiple dietary modification options and treatment options were discussed and Connor Becker agreed to follow the recommendations documented in the above note.  ARRANGE: Connor Becker was educated on the importance of frequent visits to treat obesity as outlined per CMS and USPSTF guidelines and agreed to schedule his next follow up appointment today.  I, Marcille Blanco, am acting as Location manager for Eber Jones, MD  I have reviewed the above documentation for accuracy and completeness, and I agree with the above. - Ilene Qua, MD

## 2018-06-25 ENCOUNTER — Ambulatory Visit (INDEPENDENT_AMBULATORY_CARE_PROVIDER_SITE_OTHER): Payer: Medicare Other | Admitting: Family Medicine

## 2018-06-25 ENCOUNTER — Encounter (INDEPENDENT_AMBULATORY_CARE_PROVIDER_SITE_OTHER): Payer: Self-pay | Admitting: Family Medicine

## 2018-06-25 VITALS — BP 122/84 | HR 64 | Temp 97.7°F | Ht 72.0 in | Wt 294.0 lb

## 2018-06-25 DIAGNOSIS — E1165 Type 2 diabetes mellitus with hyperglycemia: Secondary | ICD-10-CM | POA: Diagnosis not present

## 2018-06-25 DIAGNOSIS — Z6839 Body mass index (BMI) 39.0-39.9, adult: Secondary | ICD-10-CM | POA: Diagnosis not present

## 2018-06-25 DIAGNOSIS — H4089 Other specified glaucoma: Secondary | ICD-10-CM | POA: Diagnosis not present

## 2018-06-25 MED ORDER — ROSUVASTATIN CALCIUM 5 MG PO TABS: 5.0000 mg | ORAL_TABLET | Freq: Every day | ORAL | 0 refills | Status: DC

## 2018-06-25 MED ORDER — LISINOPRIL 5 MG PO TABS: 5.0000 mg | ORAL_TABLET | Freq: Every day | ORAL | 0 refills | Status: DC

## 2018-06-25 MED ORDER — METFORMIN HCL 500 MG PO TABS: 500.0000 mg | ORAL_TABLET | Freq: Every day | ORAL | 0 refills | Status: DC

## 2018-06-25 NOTE — Progress Notes (Signed)
Office: [phone]  /  Fax: [phone]   HPI:   Chief Complaint: OBESITY Connor Becker is here to discuss his progress with his obesity treatment plan. He is on the Category 4 plan and is following his eating plan approximately 40 % of the time. He states he is exercising 0 minutes 0 times per week. Rally was in [place] for the past few weeks and cut back in indulgent foods. He did notice increase in snacks though and voices he was not necessarily mindful of what snacks. He notes hunger but not getting in 8-10 oz of meat at night.  His weight is 294 lb (133.4 kg) today and has had a weight loss of 6 pounds over a period of 3 weeks since his last visit. He has lost 1 lb since starting treatment with [place].  Diabetes II with Hyperglycemia Connor Becker has a diagnosis of diabetes type II. Connor Becker's blood pressure is controlled. He is on ACE, statin, and metformin. He has been working on intensive lifestyle modifications including diet, exercise, and weight loss to help control his blood glucose levels.  Glaucoma Connor Becker has recent addition of new eye drops (timolol). He had his eye pressure checked in the past week and they were normal.  ALLERGIES: Allergies  Allergen Reactions  . Horseradish [Armoracia Rusticana Ext (Horseradish)]   . Lipitor [Atorvastatin] Other (See Comments)    Leg cramps  . Metformin And Related Other (See Comments)    GI upset  . Rye Grass Flower Pollen Extract [Gramineae Pollens]   . Soy Allergy     MEDICATIONS: Current Outpatient Medications on File Prior to Visit  Medication Sig Dispense Refill  . aspirin 325 MG tablet Take 325 mg by mouth daily.    . diphenhydrAMINE (BENADRYL) 25 MG tablet Take 25 mg by mouth every 8 (eight) hours as needed.    Marland Kitchen ibuprofen (ADVIL,MOTRIN) 200 MG tablet Take 400 mg by mouth 2 (two) times daily as needed for headache or moderate pain.    Marland Kitchen latanoprost (XALATAN) 0.005 % ophthalmic solution 1 drop at bedtime.    Marland Kitchen levothyroxine (SYNTHROID,  LEVOTHROID) 112 MCG tablet TAKE 1 TABLET BY MOUTH ONCE DAILY 90 tablet 0  . lisinopril (PRINIVIL,ZESTRIL) 5 MG tablet Take 1 tablet (5 mg total) by mouth daily. 30 tablet 0  . metFORMIN (GLUCOPHAGE) 500 MG tablet Take 1 tablet (500 mg total) by mouth daily with breakfast. 30 tablet 0  . methocarbamol (ROBAXIN) 750 MG tablet Take 750 mg by mouth 4 (four) times daily.    . Misc Natural Products (TUMERSAID PO) Take by mouth.    . Multiple Vitamins-Minerals (MULTIVITAMIN/EXTRA VITAMIN D3) CHEW Chew by mouth.    Marland Kitchen omeprazole (PRILOSEC) 20 MG capsule Take 20 mg by mouth daily.    Connor Becker VERIO test strip USE STRIP TO CHECK BLOOD SUGAR UP TO 4 TIMES PER DAY 200 each 11  . pseudoephedrine (SUDAFED) 30 MG tablet Take 60 mg by mouth 2 (two) times daily as needed for congestion.    . rosuvastatin (CRESTOR) 5 MG tablet Take 1 tablet (5 mg total) by mouth daily. 90 tablet 3  . timolol (TIMOPTIC-XR) 0.25 % ophthalmic gel-forming 1 drop daily.    . Turmeric 500 MG CAPS Take by mouth.    . vitamin C (ASCORBIC ACID) 500 MG tablet Take 1,000 mg by mouth daily.     Current Facility-Administered Medications on File Prior to Visit  Medication Dose Route Frequency Provider Last Rate Last Dose  . 0.9 %  sodium chloride infusion  500 mL Intravenous Continuous Ladene Artist, MD        PAST MEDICAL HISTORY: Past Medical History:  Diagnosis Date  . ALLERGIC RHINITIS   . Chronic pain of both knees   . DJD (degenerative joint disease) of knee    right  . DM2 (diabetes mellitus, type 2) ([place])   . GERD   . Glaucoma   . History of nuclear stress test    Myoview 12/17: EF 53, no ischemia, Low Risk  . HYPERLIPIDEMIA    myalgias from Lipitor  . HYPOGONADISM, MALE   . Hypothyroidism   . INSOMNIA-SLEEP DISORDER-UNSPEC   . Joint pain   . Obesity   . OSTEOARTHRITIS   . SLEEP APNEA, OBSTRUCTIVE    no CPAP; repeat sleep test pending 07/2016  . Unspecified hypothyroidism 12/04/2013  . Vitamin D deficiency      PAST SURGICAL HISTORY: Past Surgical History:  Procedure Laterality Date  . NASAL SEPTUM SURGERY    . TONSILLECTOMY    . UVULOPALATOPHARYNGOPLASTY    . VASECTOMY      SOCIAL HISTORY: Social History   Tobacco Use  . Smoking status: Never Smoker  . Smokeless tobacco: Never Used  Substance Use Topics  . Alcohol use: Yes    Comment: once every 4months  . Drug use: No    FAMILY HISTORY: Family History  Problem Relation Age of Onset  . Diabetes Maternal Uncle   . Heart attack Maternal Uncle   . Hypertension Unknown   . Prostate cancer Unknown   . Heart disease Unknown   . Heart attack Mother 2       s/p MI  . Other Mother        pacemaker  . Diabetes Mother   . High blood pressure Mother   . High Cholesterol Mother   . Thyroid disease Mother   . Obesity Mother   . Thyroid disease Father   . Colon cancer Neg Hx     ROS: Review of Systems  Constitutional: Positive for weight loss.  Eyes:       + Glaucoma  Endo/Heme/Allergies:       Negative hypoglycemia    PHYSICAL EXAM: Blood pressure 122/84, pulse 64, temperature 97.7 F (36.5 C), temperature source Oral, height 6' (1.829 m), weight 294 lb (133.4 kg), SpO2 98 %. Body mass index is 39.87 kg/m. Physical Exam  Constitutional: He is oriented to person, place, and time. He appears well-developed and well-nourished.  Cardiovascular: Normal rate.  Pulmonary/Chest: Effort normal.  Musculoskeletal: Normal range of motion.  Neurological: He is oriented to person, place, and time.  Skin: Skin is warm and dry.  Psychiatric: He has a normal mood and affect. His behavior is normal.  Vitals reviewed.   RECENT LABS AND TESTS: BMET    Component Value Date/Time   NA 138 05/19/2018 1218   K 4.4 05/19/2018 1218   CL 101 05/19/2018 1218   CO2 19 (L) 05/19/2018 1218   GLUCOSE 111 (H) 05/19/2018 1218   GLUCOSE 123 (H) 08/21/2017 1438   BUN 19 05/19/2018 1218   CREATININE 1.02 05/19/2018 1218   CALCIUM 10.0  05/19/2018 1218   GFRNONAA 75 05/19/2018 1218   GFRAA 86 05/19/2018 1218   Lab Results  Component Value Date   HGBA1C 6.9 03/30/2016   HGBA1C 7.0 (H) 11/17/2015   HGBA1C 6.8 (H) 04/14/2015   HGBA1C 6.9 (H) 12/04/2013   HGBA1C 6.7 (H) 09/18/2012   Lab Results  Component  Value Date   INSULIN 25.8 (H) 05/19/2018   CBC    Component Value Date/Time   WBC 9.6 08/21/2017 1438   RBC 5.04 08/21/2017 1438   HGB 15.6 08/21/2017 1438   HCT 46.4 08/21/2017 1438   PLT 232 08/21/2017 1438   MCV 92.1 08/21/2017 1438   MCH 31.0 08/21/2017 1438   MCHC 33.6 08/21/2017 1438   RDW 13.1 08/21/2017 1438   LYMPHSABS 2.8 08/21/2017 1438   MONOABS 0.8 08/21/2017 1438   EOSABS 0.2 08/21/2017 1438   BASOSABS 0.0 08/21/2017 1438   Iron/TIBC/Ferritin/ %Sat No results found for: IRON, TIBC, FERRITIN, IRONPCTSAT Lipid Panel     Component Value Date/Time   CHOL 208 (H) 11/17/2015 1428   TRIG 300.0 (H) 11/17/2015 1428   HDL 43.70 11/17/2015 1428   CHOLHDL 5 11/17/2015 1428   VLDL 60.0 (H) 11/17/2015 1428   LDLCALC 131 (H) 12/04/2013 0904   LDLDIRECT 148.0 11/17/2015 1428   Hepatic Function Panel     Component Value Date/Time   PROT 7.4 05/19/2018 1218   ALBUMIN 4.4 05/19/2018 1218   AST 21 05/19/2018 1218   ALT 22 05/19/2018 1218   ALKPHOS 80 05/19/2018 1218   BILITOT 0.5 05/19/2018 1218   BILIDIR 0.0 04/06/2016 1638      Component Value Date/Time   TSH 3.17 04/06/2016 1638   TSH 9.51 (H) 02/01/2016 1531   TSH 3.58 11/17/2015 1428    ASSESSMENT AND PLAN: Type 2 diabetes mellitus with hyperglycemia, without long-term current use of insulin (HCC) - Plan: lisinopril (PRINIVIL,ZESTRIL) 5 MG tablet, rosuvastatin (CRESTOR) 5 MG tablet, metFORMIN (GLUCOPHAGE) 500 MG tablet  Other specified glaucoma, unspecified laterality  Class 2 severe obesity with serious comorbidity and body mass index (BMI) of 39.0 to 39.9 in adult, unspecified obesity type ([place])  PLAN:  Diabetes II with  Hyperglycemia Connor Becker has been given extensive diabetes education by myself today including ideal fasting and post-prandial blood glucose readings, individual ideal Hgb A1c goals and hypoglycemia prevention. We discussed the importance of good blood sugar control to decrease the likelihood of diabetic complications such as nephropathy, neuropathy, limb loss, blindness, coronary artery disease, and death. We discussed the importance of intensive lifestyle modification including diet, exercise and weight loss as the first line treatment for diabetes. Connor Becker agrees to continue taking lisinopril 5 mg PO q daily #30 and we will refill for 1 month, and he agrees to continue taking Crestor 5 mg PO q daily #30 and we will refill for 1 month; Connor Becker agrees to continue taking metformin 500 mg q AM #30 and we will refill for 1 month. Connor Becker agrees to follow up with our clinic in 3 weeks.  Glaucoma Connor Becker will follow up with Ophthalmologist at previously scheduled appointment. Connor Becker agrees to follow up with our clinic in 3 weeks.   Obesity Connor Becker is currently in the action stage of change. As such, his goal is to continue with weight loss efforts He has agreed to follow the Category 4 plan Connor Becker has been instructed to work up to a goal of 150 minutes of combined cardio and strengthening exercise per week for weight loss and overall health benefits. We discussed the following Behavioral Modification Strategies today: increasing lean protein intake, increasing vegetables, work on meal planning and easy cooking plans, dealing with family or coworker sabotage, holiday eating strategies, and travel eating strategies   Connor Becker has agreed to follow up with our clinic in 3 weeks. He was informed of the importance of frequent follow  up visits to maximize his success with intensive lifestyle modifications for his multiple health conditions.   OBESITY BEHAVIORAL INTERVENTION VISIT  Today's visit was # 3   Starting weight: 295  lbs Starting date: 05/19/18 Today's weight : 294 lbs  Today's date: 06/25/2018 Total lbs lost to date: 1 At least 15 minutes were spent on discussing the following behavioral intervention visit.   ASK: We discussed the diagnosis of obesity with Connor Becker today and Connor Becker agreed to give [place] permission to discuss obesity behavioral modification therapy today.  ASSESS: Connor Becker has the diagnosis of obesity and his BMI today is 39.86 Connor Becker is in the action stage of change   ADVISE: Connor Becker was educated on the multiple health risks of obesity as well as the benefit of weight loss to improve his health. He was advised of the need for long term treatment and the importance of lifestyle modifications to improve his current health and to decrease his risk of future health problems.  AGREE: Multiple dietary modification options and treatment options were discussed and  Connor Becker agreed to follow the recommendations documented in the above note.  ARRANGE: Connor Becker was educated on the importance of frequent visits to treat obesity as outlined per CMS and USPSTF guidelines and agreed to schedule his next follow up appointment today.  I, Trixie Dredge, am acting as transcriptionist for Ilene Qua, MD  I have reviewed the above documentation for accuracy and completeness, and I agree with the above. - Ilene Qua, MD

## 2018-07-01 ENCOUNTER — Encounter: Payer: Self-pay | Admitting: Internal Medicine

## 2018-07-01 ENCOUNTER — Ambulatory Visit (INDEPENDENT_AMBULATORY_CARE_PROVIDER_SITE_OTHER): Payer: Medicare Other | Admitting: Internal Medicine

## 2018-07-01 VITALS — BP 130/70 | HR 74 | Temp 97.8°F | Ht 72.0 in | Wt 294.0 lb

## 2018-07-01 DIAGNOSIS — E038 Other specified hypothyroidism: Secondary | ICD-10-CM | POA: Diagnosis not present

## 2018-07-01 DIAGNOSIS — E8881 Metabolic syndrome: Secondary | ICD-10-CM | POA: Diagnosis not present

## 2018-07-01 DIAGNOSIS — E7800 Pure hypercholesterolemia, unspecified: Secondary | ICD-10-CM

## 2018-07-01 NOTE — Progress Notes (Signed)
Subjective:     Patient ID: Connor Becker , male    DOB: 1950-12-03 , 67 y.o.   MRN: [id number]   Chief Complaint  Patient presents with  . Diabetes    HPI Pt is here for FU thyroid. Has been taking his medication qd.  Glucose average this am 120's. Been from 92-167. Gets sleely if high or low.  Has been to the [facility] clinic, and only been able to follow their diet 40% of the diet before Thanksgiving. He has continued traveling. He  Was placed on Metformin, lisinopril, Vit D OTC, and Crestor 11/22 by the weight loss clinic. The Metformin has been causing diarrhea, so he takes it in the evening.  His urologist started him on Testosterone injections. Past Medical History:  Diagnosis Date  . ALLERGIC RHINITIS   . Chronic pain of both knees   . DJD (degenerative joint disease) of knee    right  . DM2 (diabetes mellitus, type 2) ([place])   . GERD   . Glaucoma   . History of nuclear stress test    Myoview 12/17: EF 53, no ischemia, Low Risk  . HYPERLIPIDEMIA    myalgias from Lipitor  . HYPOGONADISM, MALE   . Hypothyroidism   . INSOMNIA-SLEEP DISORDER-UNSPEC   . Joint pain   . Obesity   . OSTEOARTHRITIS   . SLEEP APNEA, OBSTRUCTIVE    no CPAP; repeat sleep test pending 07/2016  . Unspecified hypothyroidism 12/04/2013  . Vitamin D deficiency      Family History  Problem Relation Age of Onset  . Diabetes Maternal Uncle   . Heart attack Maternal Uncle   . Hypertension Other   . Prostate cancer Other   . Heart disease Other   . Heart attack Mother 37       s/p MI  . Other Mother        pacemaker  . Diabetes Mother   . High blood pressure Mother   . High Cholesterol Mother   . Thyroid disease Mother   . Obesity Mother   . Thyroid disease Father   . Colon cancer Neg Hx      Current Outpatient Medications:  .  aspirin 325 MG tablet, Take 325 mg by mouth daily., Disp: , Rfl:  .  diphenhydrAMINE (BENADRYL) 25 MG tablet, Take 25 mg by mouth every  8 (eight) hours as needed., Disp: , Rfl:  .  ibuprofen (ADVIL,MOTRIN) 200 MG tablet, Take 400 mg by mouth 2 (two) times daily as needed for headache or moderate pain., Disp: , Rfl:  .  latanoprost (XALATAN) 0.005 % ophthalmic solution, 1 drop at bedtime., Disp: , Rfl:  .  levothyroxine (SYNTHROID, LEVOTHROID) 112 MCG tablet, TAKE 1 TABLET BY MOUTH ONCE DAILY, Disp: 90 tablet, Rfl: 0 .  lisinopril (PRINIVIL,ZESTRIL) 5 MG tablet, Take 1 tablet (5 mg total) by mouth daily., Disp: 30 tablet, Rfl: 0 .  meloxicam (MOBIC) 15 MG tablet, TAKE 1 TABLET BY MOUTH ONCE DAILY AS NEEDED WITH FOOD FOR PAIN AND SWELLING, Disp: , Rfl: 1 .  metFORMIN (GLUCOPHAGE) 500 MG tablet, Take 1 tablet (500 mg total) by mouth daily with breakfast., Disp: 30 tablet, Rfl: 0 .  methocarbamol (ROBAXIN) 750 MG tablet, Take 750 mg by mouth 4 (four) times daily., Disp: , Rfl:  .  Misc Natural Products (TUMERSAID PO), Take by mouth., Disp: , Rfl:  .  Multiple Vitamins-Minerals (MULTIVITAMIN/EXTRA VITAMIN D3) CHEW, Chew by mouth., Disp: , Rfl:  .  omeprazole (PRILOSEC) 20 MG capsule, Take 20 mg by mouth daily., Disp: , Rfl:  .  ONETOUCH VERIO test strip, USE STRIP TO CHECK BLOOD SUGAR UP TO 4 TIMES PER DAY, Disp: 200 each, Rfl: 11 .  pseudoephedrine (SUDAFED) 30 MG tablet, Take 60 mg by mouth 2 (two) times daily as needed for congestion., Disp: , Rfl:  .  rosuvastatin (CRESTOR) 5 MG tablet, Take 1 tablet (5 mg total) by mouth daily., Disp: 30 tablet, Rfl: 0 .  tadalafil (CIALIS) 5 MG tablet, Take 5 mg by mouth daily., Disp: , Rfl: 11 .  timolol (TIMOPTIC) 0.5 % ophthalmic solution, INSTILL 1 DROP INTO EACH EYE ONCE DAILY IN THE MORNING, Disp: , Rfl: 2 .  timolol (TIMOPTIC-XR) 0.25 % ophthalmic gel-forming, 1 drop daily., Disp: , Rfl:  .  Turmeric 500 MG CAPS, Take by mouth., Disp: , Rfl:  .  vitamin C (ASCORBIC ACID) 500 MG tablet, Take 1,000 mg by mouth daily., Disp: , Rfl:   Current Facility-Administered Medications:  .  0.9 %   sodium chloride infusion, 500 mL, Intravenous, Continuous, Ladene Artist, MD   Allergies  Allergen Reactions  . Horseradish [Armoracia Rusticana Ext (Horseradish)]   . Lipitor [Atorvastatin] Other (See Comments)    Leg cramps  . Metformin And Related Other (See Comments)    GI upset  . Rye Grass Flower Pollen Extract [Gramineae Pollens]   . Soy Allergy      Review of Systems  Constitutional: Negative for diaphoresis.  HENT: Negative for tinnitus.   Cardiovascular: Negative for chest pain, palpitations and leg swelling.  Gastrointestinal: Positive for diarrhea. Negative for blood in stool and rectal pain.  Genitourinary: Negative for dysuria and urgency.       Nocturia has resolved  Musculoskeletal: Negative for gait problem and myalgias.  Skin: Negative for rash.  Neurological: Positive for light-headedness. Negative for headaches.       Light headed due to glucose     Today's Vitals   07/01/18 1034  BP: 130/70  Pulse: 74  Temp: 97.8 F (36.6 C)  TempSrc: Oral  SpO2: 96%  Weight: 294 lb (133.4 kg)  Height: 6' (1.829 m)   Body mass index is 39.87 kg/m.   Objective:  Physical Exam   Constitutional: She is oriented to person, place, and time. She appears well-developed and well-nourished. No distress.  HENT:  Head: Normocephalic and atraumatic.  Right Ear: External ear normal.  Left Ear: External ear normal.  Nose: Nose normal.  Eyes: Conjunctivae are normal. Right eye exhibits no discharge. Left eye exhibits no discharge. No scleral icterus.  Neck: Neck supple. No thyromegaly present.  No carotid bruits bilaterally  Cardiovascular: Normal rate and regular rhythm.  No murmur heard. Pulmonary/Chest: Effort normal and breath sounds normal. No respiratory distress.  Musculoskeletal: Normal range of motion. She exhibits no edema.  Lymphadenopathy:    She has no cervical adenopathy.  Neurological: She is alert and oriented to person, place, and time.  Skin: Skin  is warm and dry. Capillary refill takes less than 2 seconds. No rash noted. She is not diaphoretic.  Psychiatric: She has a normal mood and affect. Her behavior is normal. Judgment and thought content normal.  Nursing note reviewed.      Assessment And Plan:   1. Other specified hypothyroidism - TSH - T4, Free - T3, free 2- Insulin resistance- will continue Metformin. We will do HGBA1C in 3 months, unless the weight loss clinic orders this.  3- Hyperlipidemia- currently on Crestor since 11/22, so lipids will be repeated next time.   He will call us back with dose of vit D and Testosterone.  Trestan Vahle RODRIGUEZ-SOUTHWORTH, PA-C

## 2018-07-02 LAB — T4, FREE: Free T4: 1.61 ng/dL (ref 0.82–1.77)

## 2018-07-02 LAB — TSH: TSH: 2.43 u[IU]/mL (ref 0.450–4.500)

## 2018-07-02 LAB — T3, FREE: T3, Free: 2.7 pg/mL (ref 2.0–4.4)

## 2018-07-03 ENCOUNTER — Other Ambulatory Visit: Payer: Self-pay | Admitting: Internal Medicine

## 2018-07-03 DIAGNOSIS — E039 Hypothyroidism, unspecified: Secondary | ICD-10-CM

## 2018-07-03 MED ORDER — LEVOTHYROXINE SODIUM 112 MCG PO TABS: 112.0000 ug | ORAL_TABLET | Freq: Every day | ORAL | 1 refills | Status: DC

## 2018-07-03 NOTE — Progress Notes (Signed)
Thyroid med refilled x 6 months

## 2018-07-17 ENCOUNTER — Encounter (INDEPENDENT_AMBULATORY_CARE_PROVIDER_SITE_OTHER): Payer: Self-pay | Admitting: Family Medicine

## 2018-07-17 ENCOUNTER — Ambulatory Visit (INDEPENDENT_AMBULATORY_CARE_PROVIDER_SITE_OTHER): Payer: Medicare Other | Admitting: Family Medicine

## 2018-07-17 VITALS — BP 112/78 | HR 74 | Temp 97.8°F | Ht 72.0 in | Wt 296.0 lb

## 2018-07-17 DIAGNOSIS — E038 Other specified hypothyroidism: Secondary | ICD-10-CM | POA: Diagnosis not present

## 2018-07-17 DIAGNOSIS — E1165 Type 2 diabetes mellitus with hyperglycemia: Secondary | ICD-10-CM

## 2018-07-17 DIAGNOSIS — Z6841 Body Mass Index (BMI) 40.0 and over, adult: Secondary | ICD-10-CM | POA: Diagnosis not present

## 2018-07-17 NOTE — Progress Notes (Signed)
Office: [phone]  /  Fax: [phone]   HPI:   Chief Complaint: OBESITY Connor Becker is here to discuss his progress with his obesity treatment plan. He is on the Category 4 plan and is following his eating plan approximately 60-70 % of the time. He states he is exercising 0 minutes 0 times per week. Connor Becker returned to the clinic after the holidays, holiday season felt very rushed and he had a lot of indulgent eating and drinking. He couldn't really follow the plan between Christmas and New Year's secondary to socializing. He would like to get back on track.  His weight is 296 lb (134.3 kg) today and has gained 2 pounds since his last visit. He has lost 0 lbs since starting treatment with [place].  Hypothyroidism Connor Becker has a diagnosis of hypothyroidism. He is on Synthroid. He denies hot or cold intolerance or palpitations.  Diabetes II with Hyperglycemia Connor Becker has a diagnosis of diabetes type II. Connor Becker is on metformin with some GI side effects. Last A1c was 6.9. he denies hypoglycemia. He has been working on intensive lifestyle modifications including diet, exercise, and weight loss to help control his blood glucose levels.  ASSESSMENT AND PLAN:  Other specified hypothyroidism  Type 2 diabetes mellitus with hyperglycemia, without long-term current use of insulin (HCC)  Class 3 severe obesity with serious comorbidity and body mass index (BMI) of 40.0 to 44.9 in adult, unspecified obesity type ([place])  PLAN:  Hypothyroidism Connor Becker was informed of the importance of good thyroid control to help with weight loss efforts. He was also informed that supertheraputic thyroid levels are dangerous and will not improve weight loss results. Connor Becker agrees to continue taking Synthroid at current dose, and he agrees to follow up with our clinic in 2 weeks.  Diabetes II Connor Becker has been given extensive diabetes education by myself today including ideal fasting and post-prandial blood glucose readings, individual  ideal Hgb A1c goals and hypoglycemia prevention. We discussed the importance of good blood sugar control to decrease the likelihood of diabetic complications such as nephropathy, neuropathy, limb loss, blindness, coronary artery disease, and death. We discussed the importance of intensive lifestyle modification including diet, exercise and weight loss as the first line treatment for diabetes. Connor Becker agrees to continue his diabetes medications and we will repeat labs in early February 2020. Jahmil agrees to follow up with our clinic in 2 weeks.  I spent > than 50% of the 15 minute visit on counseling as documented in the note.  Obesity Connor Becker is currently in the action stage of change. As such, his goal is to continue with weight loss efforts He has agreed to follow the Category 4 plan Connor Becker has been instructed to work up to a goal of 150 minutes of combined cardio and strengthening exercise per week for weight loss and overall health benefits. We discussed the following Behavioral Modification Strategies today: increasing lean protein intake, increasing vegetables, work on meal planning and easy cooking plans, holiday eating strategies, and planning for success    Connor Becker has agreed to follow up with our clinic in 2 weeks. He was informed of the importance of frequent follow up visits to maximize his success with intensive lifestyle modifications for his multiple health conditions.  ALLERGIES: Allergies  Allergen Reactions  . Horseradish [Armoracia Rusticana Ext (Horseradish)]   . Lipitor [Atorvastatin] Other (See Comments)    Leg cramps  . Metformin And Related Other (See Comments)    GI upset  . Rye Grass Flower Pollen  Extract [Gramineae Pollens]   . Soy Allergy     MEDICATIONS: Current Outpatient Medications on File Prior to Visit  Medication Sig Dispense Refill  . aspirin 325 MG tablet Take 325 mg by mouth daily.    . diphenhydrAMINE (BENADRYL) 25 MG tablet Take 25 mg by mouth every 8  (eight) hours as needed.    Marland Kitchen ibuprofen (ADVIL,MOTRIN) 200 MG tablet Take 400 mg by mouth 2 (two) times daily as needed for headache or moderate pain.    Marland Kitchen latanoprost (XALATAN) 0.005 % ophthalmic solution 1 drop at bedtime.    Marland Kitchen levothyroxine (SYNTHROID, LEVOTHROID) 112 MCG tablet Take 1 tablet (112 mcg total) by mouth daily. 90 tablet 1  . lisinopril (PRINIVIL,ZESTRIL) 5 MG tablet Take 1 tablet (5 mg total) by mouth daily. 30 tablet 0  . meloxicam (MOBIC) 15 MG tablet TAKE 1 TABLET BY MOUTH ONCE DAILY AS NEEDED WITH FOOD FOR PAIN AND SWELLING  1  . metFORMIN (GLUCOPHAGE) 500 MG tablet Take 1 tablet (500 mg total) by mouth daily with breakfast. 30 tablet 0  . methocarbamol (ROBAXIN) 750 MG tablet Take 750 mg by mouth 4 (four) times daily.    . Misc Natural Products (TUMERSAID PO) Take by mouth.    . Multiple Vitamins-Minerals (MULTIVITAMIN/EXTRA VITAMIN D3) CHEW Chew by mouth.    Marland Kitchen omeprazole (PRILOSEC) 20 MG capsule Take 20 mg by mouth daily.    Glory Rosebush VERIO test strip USE STRIP TO CHECK BLOOD SUGAR UP TO 4 TIMES PER DAY 200 each 11  . pseudoephedrine (SUDAFED) 30 MG tablet Take 60 mg by mouth 2 (two) times daily as needed for congestion.    . rosuvastatin (CRESTOR) 5 MG tablet Take 1 tablet (5 mg total) by mouth daily. 30 tablet 0  . tadalafil (CIALIS) 5 MG tablet Take 5 mg by mouth daily.  11  . timolol (TIMOPTIC) 0.5 % ophthalmic solution INSTILL 1 DROP INTO EACH EYE ONCE DAILY IN THE MORNING  2  . timolol (TIMOPTIC-XR) 0.25 % ophthalmic gel-forming 1 drop daily.    . Turmeric 500 MG CAPS Take by mouth.    . vitamin C (ASCORBIC ACID) 500 MG tablet Take 1,000 mg by mouth daily.     Current Facility-Administered Medications on File Prior to Visit  Medication Dose Route Frequency Provider Last Rate Last Dose  . 0.9 %  sodium chloride infusion  500 mL Intravenous Continuous Ladene Artist, MD        PAST MEDICAL HISTORY: Past Medical History:  Diagnosis Date  . ALLERGIC RHINITIS     . Chronic pain of both knees   . DJD (degenerative joint disease) of knee    right  . DM2 (diabetes mellitus, type 2) ([place])   . GERD   . Glaucoma   . History of nuclear stress test    Myoview 12/17: EF 53, no ischemia, Low Risk  . HYPERLIPIDEMIA    myalgias from Lipitor  . HYPOGONADISM, MALE   . Hypothyroidism   . INSOMNIA-SLEEP DISORDER-UNSPEC   . Joint pain   . Obesity   . OSTEOARTHRITIS   . SLEEP APNEA, OBSTRUCTIVE    no CPAP; repeat sleep test pending 07/2016  . Unspecified hypothyroidism 12/04/2013  . Vitamin D deficiency     PAST SURGICAL HISTORY: Past Surgical History:  Procedure Laterality Date  . NASAL SEPTUM SURGERY    . TONSILLECTOMY    . UVULOPALATOPHARYNGOPLASTY    . VASECTOMY      SOCIAL HISTORY: Social History  Tobacco Use  . Smoking status: Never Smoker  . Smokeless tobacco: Never Used  Substance Use Topics  . Alcohol use: Yes    Comment: once every 62months  . Drug use: No    FAMILY HISTORY: Family History  Problem Relation Age of Onset  . Diabetes Maternal Uncle   . Heart attack Maternal Uncle   . Hypertension Other   . Prostate cancer Other   . Heart disease Other   . Heart attack Mother 17       s/p MI  . Other Mother        pacemaker  . Diabetes Mother   . High blood pressure Mother   . High Cholesterol Mother   . Thyroid disease Mother   . Obesity Mother   . Thyroid disease Father   . Colon cancer Neg Hx     ROS: Review of Systems  Constitutional: Negative for weight loss.  Cardiovascular: Negative for palpitations.  Endo/Heme/Allergies:       Negative hot/cold intolerance Negative hypoglycemia    PHYSICAL EXAM: Blood pressure 112/78, pulse 74, temperature 97.8 F (36.6 C), temperature source Oral, height 6' (1.829 m), weight 296 lb (134.3 kg), SpO2 98 %. Body mass index is 40.14 kg/m. Physical Exam Vitals signs reviewed.  Constitutional:      Appearance: Normal appearance. He is obese.  Cardiovascular:     Rate  and Rhythm: Normal rate.     Pulses: Normal pulses.  Pulmonary:     Effort: Pulmonary effort is normal.  Musculoskeletal: Normal range of motion.  Skin:    General: Skin is warm and dry.  Neurological:     Mental Status: He is alert and oriented to person, place, and time.  Psychiatric:        Mood and Affect: Mood normal.        Behavior: Behavior normal.     RECENT LABS AND TESTS: BMET    Component Value Date/Time   NA 138 05/19/2018 1218   K 4.4 05/19/2018 1218   CL 101 05/19/2018 1218   CO2 19 (L) 05/19/2018 1218   GLUCOSE 111 (H) 05/19/2018 1218   GLUCOSE 123 (H) 08/21/2017 1438   BUN 19 05/19/2018 1218   CREATININE 1.02 05/19/2018 1218   CALCIUM 10.0 05/19/2018 1218   GFRNONAA 75 05/19/2018 1218   GFRAA 86 05/19/2018 1218   Lab Results  Component Value Date   HGBA1C 6.9 03/30/2016   HGBA1C 7.0 (H) 11/17/2015   HGBA1C 6.8 (H) 04/14/2015   HGBA1C 6.9 (H) 12/04/2013   HGBA1C 6.7 (H) 09/18/2012   Lab Results  Component Value Date   INSULIN 25.8 (H) 05/19/2018   CBC    Component Value Date/Time   WBC 9.6 08/21/2017 1438   RBC 5.04 08/21/2017 1438   HGB 15.6 08/21/2017 1438   HCT 46.4 08/21/2017 1438   PLT 232 08/21/2017 1438   MCV 92.1 08/21/2017 1438   MCH 31.0 08/21/2017 1438   MCHC 33.6 08/21/2017 1438   RDW 13.1 08/21/2017 1438   LYMPHSABS 2.8 08/21/2017 1438   MONOABS 0.8 08/21/2017 1438   EOSABS 0.2 08/21/2017 1438   BASOSABS 0.0 08/21/2017 1438   Iron/TIBC/Ferritin/ %Sat No results found for: IRON, TIBC, FERRITIN, IRONPCTSAT Lipid Panel     Component Value Date/Time   CHOL 208 (H) 11/17/2015 1428   TRIG 300.0 (H) 11/17/2015 1428   HDL 43.70 11/17/2015 1428   CHOLHDL 5 11/17/2015 1428   VLDL 60.0 (H) 11/17/2015 1428   LDLCALC 131 (  H) 12/04/2013 0904   LDLDIRECT 148.0 11/17/2015 1428   Hepatic Function Panel     Component Value Date/Time   PROT 7.4 05/19/2018 1218   ALBUMIN 4.4 05/19/2018 1218   AST 21 05/19/2018 1218   ALT 22  05/19/2018 1218   ALKPHOS 80 05/19/2018 1218   BILITOT 0.5 05/19/2018 1218   BILIDIR 0.0 04/06/2016 1638      Component Value Date/Time   TSH 2.430 07/01/2018 1104   TSH 3.17 04/06/2016 1638   TSH 9.51 (H) 02/01/2016 1531      OBESITY BEHAVIORAL INTERVENTION VISIT  Today's visit was # 4   Starting weight: 295 lbs Starting date: 05/29/18 Today's weight : 296 lbs Today's date: 07/17/2018 Total lbs lost to date: 0    ASK: We discussed the diagnosis of obesity with Connor Becker today and Connor Becker agreed to give [place] permission to discuss obesity behavioral modification therapy today.  ASSESS: Venancio has the diagnosis of obesity and his BMI today is 40.14 Jhoan is in the action stage of change   ADVISE: Najir was educated on the multiple health risks of obesity as well as the benefit of weight loss to improve his health. He was advised of the need for long term treatment and the importance of lifestyle modifications to improve his current health and to decrease his risk of future health problems.  AGREE: Multiple dietary modification options and treatment options were discussed and  Previn agreed to follow the recommendations documented in the above note.  ARRANGE: Kym was educated on the importance of frequent visits to treat obesity as outlined per CMS and USPSTF guidelines and agreed to schedule his next follow up appointment today.  I, Trixie Dredge, am acting as transcriptionist for Ilene Qua, MD  I have reviewed the above documentation for accuracy and completeness, and I agree with the above. - Ilene Qua, MD

## 2018-07-30 ENCOUNTER — Encounter: Payer: Self-pay | Admitting: Nurse Practitioner

## 2018-07-30 ENCOUNTER — Ambulatory Visit (INDEPENDENT_AMBULATORY_CARE_PROVIDER_SITE_OTHER): Payer: Medicare Other | Admitting: Nurse Practitioner

## 2018-07-30 VITALS — BP 112/80 | HR 78 | Temp 97.7°F | Ht 68.8 in | Wt 300.0 lb

## 2018-07-30 DIAGNOSIS — J0100 Acute maxillary sinusitis, unspecified: Secondary | ICD-10-CM | POA: Diagnosis not present

## 2018-07-30 MED ORDER — CEFTRIAXONE SODIUM 1 G IJ SOLR
1.0000 g | Freq: Once | INTRAMUSCULAR | Status: AC
  Administered 2018-07-31: 1 g via INTRAMUSCULAR

## 2018-07-30 MED ORDER — CEFTRIAXONE SODIUM 500 MG IJ SOLR: 500.0000 mg | Freq: Once | INTRAMUSCULAR | Status: DC

## 2018-07-30 NOTE — Progress Notes (Signed)
Subjective:     Patient ID: Connor Becker , male    DOB: 01-30-1951 , 68 y.o.   MRN: [id number]   Chief Complaint  Patient presents with  . URI    patient states he thinks he has a sinus infection. he started feeling sick 2 days ago. productive cough,nasal congestion,sneezing,no fever, headache and itchy ears and sore throat.    HPI  Today his wife says his cheeks are "puffy".    URI   This is a new problem. The current episode started yesterday. The problem has been gradually worsening. There has been no fever. Associated symptoms include congestion, coughing, headaches, sinus pain and a sore throat. Pertinent negatives include no abdominal pain or chest pain. He has tried antihistamine, decongestant and NSAIDs for the symptoms. The treatment provided mild relief.     Past Medical History:  Diagnosis Date  . ALLERGIC RHINITIS   . Chronic pain of both knees   . DJD (degenerative joint disease) of knee    right  . DM2 (diabetes mellitus, type 2) ([place])   . GERD   . Glaucoma   . History of nuclear stress test    Myoview 12/17: EF 53, no ischemia, Low Risk  . HYPERLIPIDEMIA    myalgias from Lipitor  . HYPOGONADISM, MALE   . Hypothyroidism   . INSOMNIA-SLEEP DISORDER-UNSPEC   . Joint pain   . Obesity   . OSTEOARTHRITIS   . SLEEP APNEA, OBSTRUCTIVE    no CPAP; repeat sleep test pending 07/2016  . Unspecified hypothyroidism 12/04/2013  . Vitamin D deficiency      Family History  Problem Relation Age of Onset  . Diabetes Maternal Uncle   . Heart attack Maternal Uncle   . Hypertension Other   . Prostate cancer Other   . Heart disease Other   . Heart attack Mother 64       s/p MI  . Other Mother        pacemaker  . Diabetes Mother   . High blood pressure Mother   . High Cholesterol Mother   . Thyroid disease Mother   . Obesity Mother   . Thyroid disease Father   . Colon cancer Neg Hx      Current Outpatient Medications:  .  aspirin 325 MG tablet, Take 325  mg by mouth daily., Disp: , Rfl:  .  ibuprofen (ADVIL,MOTRIN) 200 MG tablet, Take 400 mg by mouth 2 (two) times daily as needed for headache or moderate pain., Disp: , Rfl:  .  latanoprost (XALATAN) 0.005 % ophthalmic solution, 1 drop at bedtime., Disp: , Rfl:  .  levothyroxine (SYNTHROID, LEVOTHROID) 112 MCG tablet, Take 1 tablet (112 mcg total) by mouth daily., Disp: 90 tablet, Rfl: 1 .  lisinopril (PRINIVIL,ZESTRIL) 5 MG tablet, Take 1 tablet (5 mg total) by mouth daily., Disp: 30 tablet, Rfl: 0 .  meloxicam (MOBIC) 15 MG tablet, TAKE 1 TABLET BY MOUTH ONCE DAILY AS NEEDED WITH FOOD FOR PAIN AND SWELLING, Disp: , Rfl: 1 .  methocarbamol (ROBAXIN) 750 MG tablet, Take 750 mg by mouth 4 (four) times daily., Disp: , Rfl:  .  Misc Natural Products (TUMERSAID PO), Take by mouth., Disp: , Rfl:  .  Multiple Vitamins-Minerals (MULTIVITAMIN/EXTRA VITAMIN D3) CHEW, Chew by mouth., Disp: , Rfl:  .  omeprazole (PRILOSEC) 20 MG capsule, Take 20 mg by mouth daily., Disp: , Rfl:  .  ONETOUCH VERIO test strip, USE STRIP TO CHECK BLOOD SUGAR UP  TO 4 TIMES PER DAY, Disp: 200 each, Rfl: 11 .  pseudoephedrine (SUDAFED) 30 MG tablet, Take 60 mg by mouth 2 (two) times daily as needed for congestion., Disp: , Rfl:  .  rosuvastatin (CRESTOR) 5 MG tablet, Take 1 tablet (5 mg total) by mouth daily., Disp: 30 tablet, Rfl: 0 .  tadalafil (CIALIS) 5 MG tablet, Take 5 mg by mouth daily., Disp: , Rfl: 11 .  timolol (TIMOPTIC) 0.5 % ophthalmic solution, INSTILL 1 DROP INTO EACH EYE ONCE DAILY IN THE MORNING, Disp: , Rfl: 2 .  Turmeric 500 MG CAPS, Take by mouth., Disp: , Rfl:  .  vitamin C (ASCORBIC ACID) 500 MG tablet, Take 1,000 mg by mouth daily., Disp: , Rfl:   Current Facility-Administered Medications:  .  0.9 %  sodium chloride infusion, 500 mL, Intravenous, Continuous, Ladene Artist, MD   Allergies  Allergen Reactions  . Horseradish [Armoracia Rusticana Ext (Horseradish)]   . Lipitor [Atorvastatin] Other (See  Comments)    Leg cramps  . Metformin And Related Other (See Comments)    GI upset  . Rye Grass Flower Pollen Extract [Gramineae Pollens]   . Soy Allergy      Review of Systems  Constitutional: Negative.   HENT: Positive for congestion, sinus pain and sore throat.   Eyes: Negative.   Respiratory: Positive for cough.   Cardiovascular: Negative.  Negative for chest pain, palpitations and leg swelling.  Gastrointestinal: Negative for abdominal pain.  Neurological: Positive for headaches.     Today's Vitals   07/30/18 1552  BP: 112/80  Pulse: 78  Temp: 97.7 F (36.5 C)  TempSrc: Oral  SpO2: 96%  Weight: 300 lb (136.1 kg)  Height: 5' 8.8" (1.748 m)  PainSc: 4   PainLoc: Head   Body mass index is 44.56 kg/m.   Objective:  Physical Exam Vitals signs reviewed.  Constitutional:      Appearance: Normal appearance. He is obese.  HENT:     Head: Normocephalic and atraumatic.     Right Ear: Tympanic membrane normal.     Left Ear: Tympanic membrane normal.     Nose: Congestion and rhinorrhea present.     Right Turbinates: Swollen.     Left Turbinates: Swollen.     Mouth/Throat:     Mouth: Mucous membranes are moist.  Eyes:     Extraocular Movements: Extraocular movements intact.     Conjunctiva/sclera: Conjunctivae normal.     Pupils: Pupils are equal, round, and reactive to light.  Neck:     Musculoskeletal: Normal range of motion and neck supple. No muscular tenderness.  Cardiovascular:     Rate and Rhythm: Normal rate and regular rhythm.     Pulses: Normal pulses.     Heart sounds: Normal heart sounds. No murmur.  Neurological:     Mental Status: He is alert.         Assessment And Plan:     1. Acute non-recurrent maxillary sinusitis  Will treat with rocephin encouraged this is early treatment and likely viral  Has tenderness to maxillary sinuses - cefTRIAXone (ROCEPHIN) injection 500 mg       Minette Brine, FNP

## 2018-07-30 NOTE — Patient Instructions (Addendum)

## 2018-07-31 ENCOUNTER — Encounter (INDEPENDENT_AMBULATORY_CARE_PROVIDER_SITE_OTHER): Payer: Self-pay

## 2018-07-31 ENCOUNTER — Ambulatory Visit (INDEPENDENT_AMBULATORY_CARE_PROVIDER_SITE_OTHER): Payer: Medicare Other | Admitting: Family Medicine

## 2018-07-31 DIAGNOSIS — J0100 Acute maxillary sinusitis, unspecified: Secondary | ICD-10-CM | POA: Diagnosis not present

## 2018-08-07 ENCOUNTER — Encounter: Payer: Self-pay | Admitting: Nurse Practitioner

## 2018-08-07 ENCOUNTER — Other Ambulatory Visit: Payer: Self-pay | Admitting: Nurse Practitioner

## 2018-08-07 DIAGNOSIS — J0100 Acute maxillary sinusitis, unspecified: Secondary | ICD-10-CM

## 2018-08-07 MED ORDER — AMOXICILLIN-POT CLAVULANATE 875-125 MG PO TABS: 1.0000 | ORAL_TABLET | Freq: Two times a day (BID) | ORAL | 0 refills | Status: AC

## 2018-08-25 ENCOUNTER — Encounter: Payer: Self-pay | Admitting: Internal Medicine

## 2018-08-29 ENCOUNTER — Other Ambulatory Visit: Payer: Self-pay | Admitting: Nurse Practitioner

## 2018-08-29 DIAGNOSIS — J3089 Other allergic rhinitis: Secondary | ICD-10-CM

## 2018-08-29 DIAGNOSIS — R0981 Nasal congestion: Secondary | ICD-10-CM

## 2018-08-29 MED ORDER — RYVENT 6 MG PO TABS: 6.0000 mg | ORAL_TABLET | Freq: Two times a day (BID) | ORAL | 1 refills | Status: DC | PRN

## 2018-09-09 DIAGNOSIS — H2513 Age-related nuclear cataract, bilateral: Secondary | ICD-10-CM | POA: Diagnosis not present

## 2018-09-09 DIAGNOSIS — H401322 Pigmentary glaucoma, left eye, moderate stage: Secondary | ICD-10-CM | POA: Diagnosis not present

## 2018-09-09 DIAGNOSIS — H401133 Primary open-angle glaucoma, bilateral, severe stage: Secondary | ICD-10-CM | POA: Diagnosis not present

## 2018-09-09 DIAGNOSIS — H401313 Pigmentary glaucoma, right eye, severe stage: Secondary | ICD-10-CM | POA: Diagnosis not present

## 2018-09-15 ENCOUNTER — Telehealth: Payer: Self-pay

## 2018-09-15 NOTE — Telephone Encounter (Signed)
Need to know how pt is doing and if he will pay the $10 cash pay for the ryvent. 1st attempt

## 2018-09-19 ENCOUNTER — Telehealth: Payer: Self-pay

## 2018-09-19 NOTE — Telephone Encounter (Signed)
2ND ATTEMPT TO CALL PT

## 2018-12-04 ENCOUNTER — Telehealth: Payer: Self-pay | Admitting: Internal Medicine

## 2018-12-04 NOTE — Telephone Encounter (Signed)
I left a message asking the patient to call me at [phone] to schedule virtual AWV visit if interested. VDM (DD) °

## 2019-02-03 ENCOUNTER — Ambulatory Visit (INDEPENDENT_AMBULATORY_CARE_PROVIDER_SITE_OTHER): Payer: Medicare Other | Admitting: Nurse Practitioner

## 2019-02-03 ENCOUNTER — Encounter: Payer: Self-pay | Admitting: Nurse Practitioner

## 2019-02-03 ENCOUNTER — Other Ambulatory Visit: Payer: Self-pay

## 2019-02-03 DIAGNOSIS — Z20818 Contact with and (suspected) exposure to other bacterial communicable diseases: Secondary | ICD-10-CM

## 2019-02-03 DIAGNOSIS — J029 Acute pharyngitis, unspecified: Secondary | ICD-10-CM

## 2019-02-03 MED ORDER — AMOXICILLIN 875 MG PO TABS: 875.0000 mg | ORAL_TABLET | Freq: Two times a day (BID) | ORAL | 0 refills | Status: DC

## 2019-02-03 NOTE — Progress Notes (Signed)
Virtual Visit via Video   This visit type was conducted due to national recommendations for restrictions regarding the COVID-19 Pandemic (e.g. social distancing) in an effort to limit this patient's exposure and mitigate transmission in our community.  Due to his co-morbid illnesses, this patient is at least at moderate risk for complications without adequate follow up.  This format is felt to be most appropriate for this patient at this time.  All issues noted in this document were discussed and addressed.  A limited physical exam was performed with this format.    This visit type was conducted due to national recommendations for restrictions regarding the COVID-19 Pandemic (e.g. social distancing) in an effort to limit this patient's exposure and mitigate transmission in our community.  Patients identity confirmed using two different identifiers.  This format is felt to be most appropriate for this patient at this time.  All issues noted in this document were discussed and addressed.  No physical exam was performed (except for noted visual exam findings with Video Visits).    Date:  02/09/2019   ID:  Connor Copas., DOB 09-30-1950, MRN [id number]  Patient Location:  Home - spoke with Connor Becker  Provider location:   Office    Chief Complaint:    History of Present Illness:    Connor Groot. is a 68 y.o. male who presents via video conferencing for a telehealth visit today.    The patient does have symptoms concerning for COVID-19 infection (fever, chills, cough, or new shortness of breath).   He was with his son and daughter in law who have been diagnosed with strep throat.  At night sore throat.  Will take sudafed or ibuprofen.  Hacking cough with drainage and when he gets up in the morning.  Daughter in law was tested for COVID which was negative. [place]   Sore Throat  This is a new problem. The pain is worse on the left side. There  has been no fever. The patient is experiencing no pain. Associated symptoms include coughing. Pertinent negatives include no abdominal pain or headaches.     Past Medical History:  Diagnosis Date  . ALLERGIC RHINITIS   . Chronic pain of both knees   . DJD (degenerative joint disease) of knee    right  . DM2 (diabetes mellitus, type 2) ([place])   . GERD   . Glaucoma   . History of nuclear stress test    Myoview 12/17: EF 53, no ischemia, Low Risk  . HYPERLIPIDEMIA    myalgias from Lipitor  . HYPOGONADISM, MALE   . Hypothyroidism   . INSOMNIA-SLEEP DISORDER-UNSPEC   . Joint pain   . Obesity   . OSTEOARTHRITIS   . SLEEP APNEA, OBSTRUCTIVE    no CPAP; repeat sleep test pending 07/2016  . Unspecified hypothyroidism 12/04/2013  . Vitamin D deficiency    Past Surgical History:  Procedure Laterality Date  . NASAL SEPTUM SURGERY    . TONSILLECTOMY    . UVULOPALATOPHARYNGOPLASTY    . VASECTOMY       Current Meds  Medication Sig  . aspirin 325 MG tablet Take 325 mg by mouth daily.  Marland Kitchen ibuprofen (ADVIL,MOTRIN) 200 MG tablet Take 400 mg by mouth 2 (two) times daily as needed for headache or moderate pain.  Marland Kitchen latanoprost (XALATAN) 0.005 % ophthalmic solution 1 drop at bedtime.  Marland Kitchen levothyroxine (SYNTHROID, LEVOTHROID) 112 MCG tablet Take 1 tablet (112 mcg  total) by mouth daily.  . meloxicam (MOBIC) 15 MG tablet TAKE 1 TABLET BY MOUTH ONCE DAILY AS NEEDED WITH FOOD FOR PAIN AND SWELLING  . methocarbamol (ROBAXIN) 750 MG tablet Take 750 mg by mouth 4 (four) times daily.  . Misc Natural Products (TUMERSAID PO) Take by mouth.  . Multiple Vitamins-Minerals (MULTIVITAMIN/EXTRA VITAMIN D3) CHEW Chew by mouth.  Marland Kitchen omeprazole (PRILOSEC) 20 MG capsule Take 20 mg by mouth daily.  Glory Rosebush VERIO test strip USE STRIP TO CHECK BLOOD SUGAR UP TO 4 TIMES PER DAY  . pseudoephedrine (SUDAFED) 30 MG tablet Take 60 mg by mouth 2 (two) times daily as needed for congestion.  . tadalafil (CIALIS) 5 MG tablet  Take 5 mg by mouth daily.  Marland Kitchen testosterone enanthate (DELATESTRYL) 200 MG/ML injection Inject 200 mg into the muscle every 30 (thirty) days. For IM use only  . timolol (TIMOPTIC) 0.5 % ophthalmic solution INSTILL 1 DROP INTO EACH EYE ONCE DAILY IN THE MORNING  . Turmeric 500 MG CAPS Take by mouth.  . vitamin C (ASCORBIC ACID) 500 MG tablet Take 1,000 mg by mouth daily.   Current Facility-Administered Medications for the 02/03/19 encounter (Office Visit) with Minette Brine, FNP  Medication  . 0.9 %  sodium chloride infusion     Allergies:   Horseradish [armoracia rusticana ext (horseradish)], Lipitor [atorvastatin], Metformin and related, Rye grass flower pollen extract [gramineae pollens], and Soy allergy   Social History   Tobacco Use  . Smoking status: Never Smoker  . Smokeless tobacco: Never Used  Substance Use Topics  . Alcohol use: Yes    Comment: once every 43months  . Drug use: No     Family Hx: The patient's family history includes Diabetes in his maternal uncle and mother; Heart attack in his maternal uncle; Heart attack (age of onset: 70) in his mother; Heart disease in an other family member; High Cholesterol in his mother; High blood pressure in his mother; Hypertension in an other family member; Obesity in his mother; Other in his mother; Prostate cancer in an other family member; Thyroid disease in his father and mother. There is no history of Colon cancer.  ROS:   Please see the history of present illness.    Review of Systems  Respiratory: Positive for cough.   Gastrointestinal: Negative for abdominal pain.  Neurological: Negative.  Negative for dizziness, tingling and headaches.    All other systems reviewed and are negative.   Labs/Other Tests and Data Reviewed:    Recent Labs: 05/19/2018: ALT 22; BUN 19; Creatinine, Ser 1.02; Potassium 4.4; Sodium 138 07/01/2018: TSH 2.430   Recent Lipid Panel Lab Results  Component Value Date/Time   CHOL 208 (H)  11/17/2015 02:28 PM   TRIG 300.0 (H) 11/17/2015 02:28 PM   HDL 43.70 11/17/2015 02:28 PM   CHOLHDL 5 11/17/2015 02:28 PM   LDLCALC 131 (H) 12/04/2013 09:04 AM   LDLDIRECT 148.0 11/17/2015 02:28 PM    Wt Readings from Last 3 Encounters:  07/30/18 300 lb (136.1 kg)  07/17/18 296 lb (134.3 kg)  07/01/18 294 lb (133.4 kg)     Exam:    Vital Signs:  There were no vitals taken for this visit.    Physical Exam  Constitutional: He is oriented to person, place, and time. No distress.  Neurological: He is alert and oriented to person, place, and time.  Psychiatric: Mood, memory, affect and judgment normal.    ASSESSMENT & PLAN:    1. Sore throat  Recent exposure to strep however I am unable to visualize his throat or obtain a strep test due to failed virtual visit  Will treat with amoxicillin and send for COVID testing due to recent travel and symptoms of sorethroat, hacking cough.   - Novel Coronavirus, NAA ([facility]) - amoxicillin (AMOXIL) 875 MG tablet; Take 1 tablet (875 mg total) by mouth 2 (two) times daily.  Dispense: 14 tablet; Refill: 0   COVID-19 Education: The signs and symptoms of COVID-19 were discussed with the patient and how to seek care for testing (follow up with PCP or arrange E-visit).  The importance of social distancing was discussed today.  Patient Risk:   After full review of this patients clinical status, I feel that they are at least moderate risk at this time.  Time:   Today, I have spent 12 minutes/ seconds with the patient with telehealth technology discussing above diagnoses.     Medication Adjustments/Labs and Tests Ordered: Current medicines are reviewed at length with the patient today.  Concerns regarding medicines are outlined above.   Tests Ordered: No orders of the defined types were placed in this encounter.   Medication Changes: Meds ordered this encounter  Medications  . amoxicillin (AMOXIL) 875 MG tablet    Sig: Take 1 tablet (875  mg total) by mouth 2 (two) times daily.    Dispense:  14 tablet    Refill:  0    Disposition:  Follow up prn  Signed, Minette Brine, FNP

## 2019-02-04 ENCOUNTER — Other Ambulatory Visit: Payer: Self-pay

## 2019-02-04 DIAGNOSIS — Z20822 Contact with and (suspected) exposure to covid-19: Secondary | ICD-10-CM

## 2019-02-05 ENCOUNTER — Telehealth: Payer: Self-pay | Admitting: Internal Medicine

## 2019-02-05 NOTE — Telephone Encounter (Signed)
I left a message asking the patient to call me at [phone] to reschedule 03/10/2019 appointment (reschedule to 03/11/2019 at 2:15 if still available). VDM (DD)

## 2019-02-07 LAB — NOVEL CORONAVIRUS, NAA: SARS-CoV-2, NAA: NOT DETECTED

## 2019-03-09 ENCOUNTER — Ambulatory Visit: Payer: Medicare Other | Admitting: Internal Medicine

## 2019-03-10 ENCOUNTER — Ambulatory Visit: Payer: Medicare Other | Admitting: Internal Medicine

## 2019-03-10 ENCOUNTER — Ambulatory Visit: Payer: Medicare Other

## 2019-03-11 ENCOUNTER — Ambulatory Visit (INDEPENDENT_AMBULATORY_CARE_PROVIDER_SITE_OTHER): Payer: Medicare Other | Admitting: Internal Medicine

## 2019-03-11 ENCOUNTER — Encounter: Payer: Self-pay | Admitting: Internal Medicine

## 2019-03-11 ENCOUNTER — Other Ambulatory Visit: Payer: Self-pay

## 2019-03-11 ENCOUNTER — Ambulatory Visit: Payer: Medicare Other

## 2019-03-11 VITALS — BP 112/82 | HR 79 | Temp 97.4°F | Ht 68.8 in | Wt 299.2 lb

## 2019-03-11 DIAGNOSIS — M7121 Synovial cyst of popliteal space [Baker], right knee: Secondary | ICD-10-CM | POA: Diagnosis not present

## 2019-03-11 DIAGNOSIS — I10 Essential (primary) hypertension: Secondary | ICD-10-CM | POA: Diagnosis not present

## 2019-03-11 DIAGNOSIS — M170 Bilateral primary osteoarthritis of knee: Secondary | ICD-10-CM

## 2019-03-11 DIAGNOSIS — E038 Other specified hypothyroidism: Secondary | ICD-10-CM

## 2019-03-11 DIAGNOSIS — Z6841 Body Mass Index (BMI) 40.0 and over, adult: Secondary | ICD-10-CM

## 2019-03-11 DIAGNOSIS — E1165 Type 2 diabetes mellitus with hyperglycemia: Secondary | ICD-10-CM

## 2019-03-11 DIAGNOSIS — E291 Testicular hypofunction: Secondary | ICD-10-CM

## 2019-03-11 MED ORDER — MAGNESIUM 400 MG PO CAPS: 400.0000 mg | ORAL_CAPSULE | Freq: Every day | ORAL | 1 refills | Status: DC

## 2019-03-11 MED ORDER — LISINOPRIL 5 MG PO TABS: 5.0000 mg | ORAL_TABLET | Freq: Every day | ORAL | 0 refills | Status: DC

## 2019-03-11 NOTE — Patient Instructions (Signed)
Chicken and beef bone broth    Diabetes Mellitus and Exercise Exercising regularly is important for your overall health, especially when you have diabetes (diabetes mellitus). Exercising is not only about losing weight. It has many other health benefits, such as increasing muscle strength and bone density and reducing body fat and stress. This leads to improved fitness, flexibility, and endurance, all of which result in better overall health. Exercise has additional benefits for people with diabetes, including:  Reducing appetite.  Helping to lower and control blood glucose.  Lowering blood pressure.  Helping to control amounts of fatty substances (lipids) in the blood, such as cholesterol and triglycerides.  Helping the body to respond better to insulin (improving insulin sensitivity).  Reducing how much insulin the body needs.  Decreasing the risk for heart disease by: ? Lowering cholesterol and triglyceride levels. ? Increasing the levels of good cholesterol. ? Lowering blood glucose levels. What is my activity plan? Your health care provider or certified diabetes educator can help you make a plan for the type and frequency of exercise (activity plan) that works for you. Make sure that you:  Do at least 150 minutes of moderate-intensity or vigorous-intensity exercise each week. This could be brisk walking, biking, or water aerobics. ? Do stretching and strength exercises, such as yoga or weightlifting, at least 2 times a week. ? Spread out your activity over at least 3 days of the week.  Get some form of physical activity every day. ? Do not go more than 2 days in a row without some kind of physical activity. ? Avoid being inactive for more than 30 minutes at a time. Take frequent breaks to walk or stretch.  Choose a type of exercise or activity that you enjoy, and set realistic goals.  Start slowly, and gradually increase the intensity of your exercise over time. What do I  need to know about managing my diabetes?   Check your blood glucose before and after exercising. ? If your blood glucose is 240 mg/dL (13.3 mmol/L) or higher before you exercise, check your urine for ketones. If you have ketones in your urine, do not exercise until your blood glucose returns to normal. ? If your blood glucose is 100 mg/dL (5.6 mmol/L) or lower, eat a snack containing 15-20 grams of carbohydrate. Check your blood glucose 15 minutes after the snack to make sure that your level is above 100 mg/dL (5.6 mmol/L) before you start your exercise.  Know the symptoms of low blood glucose (hypoglycemia) and how to treat it. Your risk for hypoglycemia increases during and after exercise. Common symptoms of hypoglycemia can include: ? Hunger. ? Anxiety. ? Sweating and feeling clammy. ? Confusion. ? Dizziness or feeling light-headed. ? Increased heart rate or palpitations. ? Blurry vision. ? Tingling or numbness around the mouth, lips, or tongue. ? Tremors or shakes. ? Irritability.  Keep a rapid-acting carbohydrate snack available before, during, and after exercise to help prevent or treat hypoglycemia.  Avoid injecting insulin into areas of the body that are going to be exercised. For example, avoid injecting insulin into: ? The arms, when playing tennis. ? The legs, when jogging.  Keep records of your exercise habits. Doing this can help you and your health care provider adjust your diabetes management plan as needed. Write down: ? Food that you eat before and after you exercise. ? Blood glucose levels before and after you exercise. ? The type and amount of exercise you have done. ? When your  insulin is expected to peak, if you use insulin. Avoid exercising at times when your insulin is peaking.  When you start a new exercise or activity, work with your health care provider to make sure the activity is safe for you, and to adjust your insulin, medicines, or food intake as needed.   Drink plenty of water while you exercise to prevent dehydration or heat stroke. Drink enough fluid to keep your urine clear or pale yellow. Summary  Exercising regularly is important for your overall health, especially when you have diabetes (diabetes mellitus).  Exercising has many health benefits, such as increasing muscle strength and bone density and reducing body fat and stress.  Your health care provider or certified diabetes educator can help you make a plan for the type and frequency of exercise (activity plan) that works for you.  When you start a new exercise or activity, work with your health care provider to make sure the activity is safe for you, and to adjust your insulin, medicines, or food intake as needed. This information is not intended to replace advice given to you by your health care provider. Make sure you discuss any questions you have with your health care provider. Document Released: 09/22/2003 Document Revised: 01/24/2017 Document Reviewed: 12/12/2015 Elsevier Patient Education  2020 [employer].

## 2019-03-13 LAB — CMP14+EGFR
ALT: 17 IU/L (ref 0–44)
AST: 19 IU/L (ref 0–40)
Albumin/Globulin Ratio: 1.6 (ref 1.2–2.2)
Albumin: 4.1 g/dL (ref 3.8–4.8)
Alkaline Phosphatase: 74 IU/L (ref 39–117)
BUN/Creatinine Ratio: 16 (ref 10–24)
BUN: 19 mg/dL (ref 8–27)
Bilirubin Total: 0.5 mg/dL (ref 0.0–1.2)
CO2: 24 mmol/L (ref 20–29)
Calcium: 10.2 mg/dL (ref 8.6–10.2)
Chloride: 99 mmol/L (ref 96–106)
Creatinine, Ser: 1.2 mg/dL (ref 0.76–1.27)
GFR calc Af Amer: 70 mL/min/{1.73_m2} (ref >59)
GFR calc non Af Amer: 61 mL/min/{1.73_m2} (ref >59)
Globulin, Total: 2.6 g/dL (ref 1.5–4.5)
Glucose: 96 mg/dL (ref 65–99)
Potassium: 4.6 mmol/L (ref 3.5–5.2)
Sodium: 139 mmol/L (ref 134–144)
Total Protein: 6.7 g/dL (ref 6.0–8.5)

## 2019-03-13 LAB — HEMOGLOBIN A1C
Est. average glucose Bld gHb Est-mCnc: 146 mg/dL
Hgb A1c MFr Bld: 6.7 % — ABNORMAL HIGH (ref 4.8–5.6)

## 2019-03-13 LAB — CBC
Hematocrit: 45.6 % (ref 37.5–51.0)
Hemoglobin: 15.4 g/dL (ref 13.0–17.7)
MCH: 30.9 pg (ref 26.6–33.0)
MCHC: 33.8 g/dL (ref 31.5–35.7)
MCV: 92 fL (ref 79–97)
Platelets: 259 10*3/uL (ref 150–450)
RBC: 4.98 x10E6/uL (ref 4.14–5.80)
RDW: 13.2 % (ref 11.6–15.4)
WBC: 8.3 10*3/uL (ref 3.4–10.8)

## 2019-03-13 LAB — LIPID PANEL
Chol/HDL Ratio: 5.1 ratio — ABNORMAL HIGH (ref 0.0–5.0)
Cholesterol, Total: 203 mg/dL — ABNORMAL HIGH (ref 100–199)
HDL: 40 mg/dL (ref >39)
LDL Calculated: 117 mg/dL — ABNORMAL HIGH (ref 0–99)
Triglycerides: 230 mg/dL — ABNORMAL HIGH (ref 0–149)
VLDL Cholesterol Cal: 46 mg/dL — ABNORMAL HIGH (ref 5–40)

## 2019-03-13 LAB — TSH: TSH: 2.97 u[IU]/mL (ref 0.450–4.500)

## 2019-03-13 LAB — TESTOSTERONE: Testosterone: 195 ng/dL — ABNORMAL LOW (ref 264–916)

## 2019-03-13 LAB — TESTOSTERONE, FREE: Testosterone, Free: 2.8 pg/mL — ABNORMAL LOW (ref 6.6–18.1)

## 2019-03-13 LAB — T4, FREE: Free T4: 1.45 ng/dL (ref 0.82–1.77)

## 2019-03-14 MED ORDER — LISINOPRIL 5 MG PO TABS: 5.0000 mg | ORAL_TABLET | Freq: Every day | ORAL | 1 refills | Status: DC

## 2019-03-14 NOTE — Progress Notes (Signed)
Subjective:     Patient ID: Connor Becker , male    DOB: Oct 01, 1950 , 68 y.o.   MRN: [id number]   Chief Complaint  Patient presents with  . Diabetes  . Hypertension    HPI  Diabetes He presents for his follow-up diabetic visit. He has type 2 diabetes mellitus. There are no hypoglycemic associated symptoms. Pertinent negatives for diabetes include no blurred vision and no chest pain. There are no hypoglycemic complications. There are no diabetic complications. Risk factors for coronary artery disease include diabetes mellitus, dyslipidemia, male sex, obesity and sedentary lifestyle. He participates in exercise intermittently. An ACE inhibitor/angiotensin II receptor blocker is being taken. Eye exam is not current.  Hypertension This is a chronic problem. The current episode started more than 1 year ago. The problem has been gradually improving since onset. The problem is controlled. Pertinent negatives include no blurred vision, chest pain, palpitations or shortness of breath. Past treatments include ACE inhibitors. The current treatment provides moderate improvement. Compliance problems include exercise.      Past Medical History:  Diagnosis Date  . ALLERGIC RHINITIS   . Chronic pain of both knees   . DJD (degenerative joint disease) of knee    right  . DM2 (diabetes mellitus, type 2) ([place])   . GERD   . Glaucoma   . History of nuclear stress test    Myoview 12/17: EF 53, no ischemia, Low Risk  . HYPERLIPIDEMIA    myalgias from Lipitor  . HYPOGONADISM, MALE   . Hypothyroidism   . INSOMNIA-SLEEP DISORDER-UNSPEC   . Joint pain   . Obesity   . OSTEOARTHRITIS   . SLEEP APNEA, OBSTRUCTIVE    no CPAP; repeat sleep test pending 07/2016  . Unspecified hypothyroidism 12/04/2013  . Vitamin D deficiency      Family History  Problem Relation Age of Onset  . Diabetes Maternal Uncle   . Heart attack Maternal Uncle   . Hypertension Other   . Prostate cancer Other   . Heart  disease Other   . Heart attack Mother 71       s/p MI  . Other Mother        pacemaker  . Diabetes Mother   . High blood pressure Mother   . High Cholesterol Mother   . Thyroid disease Mother   . Obesity Mother   . Thyroid disease Father   . Colon cancer Neg Hx      Current Outpatient Medications:  .  aspirin 325 MG tablet, Take 325 mg by mouth daily., Disp: , Rfl:  .  ibuprofen (ADVIL,MOTRIN) 200 MG tablet, Take 400 mg by mouth 2 (two) times daily as needed for headache or moderate pain., Disp: , Rfl:  .  latanoprost (XALATAN) 0.005 % ophthalmic solution, 1 drop at bedtime., Disp: , Rfl:  .  levothyroxine (SYNTHROID, LEVOTHROID) 112 MCG tablet, Take 1 tablet (112 mcg total) by mouth daily., Disp: 90 tablet, Rfl: 1 .  lisinopril (ZESTRIL) 5 MG tablet, Take 1 tablet (5 mg total) by mouth daily., Disp: 30 tablet, Rfl: 0 .  methocarbamol (ROBAXIN) 750 MG tablet, Take 750 mg by mouth 4 (four) times daily., Disp: , Rfl:  .  Misc Natural Products (TUMERSAID PO), Take by mouth., Disp: , Rfl:  .  Multiple Vitamins-Minerals (MULTIVITAMIN/EXTRA VITAMIN D3) CHEW, Chew by mouth., Disp: , Rfl:  .  omeprazole (PRILOSEC) 20 MG capsule, Take 20 mg by mouth daily., Disp: , Rfl:  .  ONETOUCH  VERIO test strip, USE STRIP TO CHECK BLOOD SUGAR UP TO 4 TIMES PER DAY, Disp: 200 each, Rfl: 11 .  pseudoephedrine (SUDAFED) 30 MG tablet, Take 60 mg by mouth 2 (two) times daily as needed for congestion., Disp: , Rfl:  .  tadalafil (CIALIS) 5 MG tablet, Take 5 mg by mouth daily., Disp: , Rfl: 11 .  testosterone enanthate (DELATESTRYL) 200 MG/ML injection, Inject 200 mg into the muscle every 30 (thirty) days. For IM use only, Disp: , Rfl:  .  timolol (TIMOPTIC) 0.5 % ophthalmic solution, INSTILL 1 DROP INTO EACH EYE ONCE DAILY IN THE MORNING, Disp: , Rfl: 2 .  Turmeric 500 MG CAPS, Take by mouth., Disp: , Rfl:  .  vitamin C (ASCORBIC ACID) 500 MG tablet, Take 1,000 mg by mouth daily., Disp: , Rfl:  .  Magnesium 400  MG CAPS, Take 400 mg by mouth at bedtime., Disp: 90 capsule, Rfl: 1 .  rosuvastatin (CRESTOR) 5 MG tablet, Take 1 tablet (5 mg total) by mouth daily., Disp: 30 tablet, Rfl: 0 .  RYVENT 6 MG TABS, Take 6 mg by mouth 2 (two) times daily as needed. (Patient not taking: Reported on 02/03/2019), Disp: 20 tablet, Rfl: 1   Allergies  Allergen Reactions  . Horseradish [Armoracia Rusticana Ext (Horseradish)]   . Lipitor [Atorvastatin] Other (See Comments)    Leg cramps  . Metformin And Related Other (See Comments)    GI upset  . Rye Grass Flower Pollen Extract [Gramineae Pollens]   . Soy Allergy      Review of Systems  Constitutional: Negative.   Eyes: Negative for blurred vision.  Respiratory: Negative.  Negative for shortness of breath.   Cardiovascular: Negative.  Negative for chest pain and palpitations.  Gastrointestinal: Negative.   Musculoskeletal: Positive for arthralgias.       He reports he is unable to exercise due to b/l knee pain. There is pain with ambulation. Also states he has Baker's cyst. He is followed by Ortho. He denies fall/trauma.   Neurological: Negative.   Psychiatric/Behavioral: Negative.      Today's Vitals   03/11/19 1434  BP: 112/82  Pulse: 79  Temp: (!) 97.4 F (36.3 C)  TempSrc: Oral  Weight: 299 lb 3.2 oz (135.7 kg)  Height: 5' 8.8" (1.748 m)   Body mass index is 44.44 kg/m.   Objective:  Physical Exam Vitals signs and nursing note reviewed.  Constitutional:      Appearance: Normal appearance. He is obese.  Cardiovascular:     Rate and Rhythm: Normal rate and regular rhythm.     Heart sounds: Normal heart sounds.  Pulmonary:     Effort: Pulmonary effort is normal.     Breath sounds: Normal breath sounds.  Musculoskeletal:     Comments: B/l crepitus  Skin:    General: Skin is warm.  Neurological:     General: No focal deficit present.     Mental Status: He is alert.  Psychiatric:        Mood and Affect: Mood normal.          Assessment And Plan:     1. Type 2 diabetes mellitus with hyperglycemia, without long-term current use of insulin ([place])  I will check labs as listed below.  Importance of compliance with diet, exercise, medications and office visits was discussed with the patient.   - Lipid panel - CMP14+EGFR - Hemoglobin A1c - lisinopril (ZESTRIL) 5 MG tablet; Take 1 tablet (5 mg total)  by mouth daily.  Dispense: 30 tablet; Refill: 0  2. Essential hypertension, benign  Chronic, well controlled. He will continue with current meds. He is encouraged to avoid adding salt to his foods.   - CBC no Diff  3. Other specified hypothyroidism  I will check thyroid panel and adjust meds as needed.  - TSH - T4, Free  4. Bilateral primary osteoarthritis of knee  Chronic. He is encouraged to follow an anti-inflammatory diet. He plans to f/u with Ortho. He may benefit from topical pain cream.   5. Synovial cyst of right popliteal space  Chronic. Again, he plans to f/u with Ortho.   6. Hypogonadism male  I will check his testosterone levels. He gets monthly testosterone injections as per Urology.   He reports he has not had his levels checked in awhile.   - Testosterone, Total - Testosterone, free   7. Class 3 severe obesity due to excess calories with serious comorbidity and body mass index (BMI) of 40.0 to 44.9 in adult [facility])  Importance of achieving optimal weight to decrease risk of cardiovascular disease and cancers was discussed with the patient in full detail. Importance of regular exercise was discussed with the patient. He is encouraged to start slowly - start with 10 minutes twice daily at least three to four days per week and to gradually build to 30 minutes five days weekly. He was given tips to incorporate more activity into her daily routine - take stairs when possible, park farther away from his job, grocery stores, etc.      Maximino Greenland, MD    THE PATIENT IS ENCOURAGED TO  PRACTICE SOCIAL DISTANCING DUE TO THE COVID-19 PANDEMIC.

## 2019-03-20 ENCOUNTER — Other Ambulatory Visit: Payer: Self-pay | Admitting: Internal Medicine

## 2019-03-20 DIAGNOSIS — E039 Hypothyroidism, unspecified: Secondary | ICD-10-CM

## 2019-03-31 ENCOUNTER — Ambulatory Visit (INDEPENDENT_AMBULATORY_CARE_PROVIDER_SITE_OTHER): Payer: Medicare Other | Admitting: Internal Medicine

## 2019-03-31 ENCOUNTER — Encounter: Payer: Self-pay | Admitting: Internal Medicine

## 2019-03-31 ENCOUNTER — Ambulatory Visit (INDEPENDENT_AMBULATORY_CARE_PROVIDER_SITE_OTHER): Payer: Medicare Other

## 2019-03-31 ENCOUNTER — Other Ambulatory Visit: Payer: Self-pay

## 2019-03-31 VITALS — BP 128/80 | HR 92 | Temp 98.1°F | Ht 71.2 in | Wt 298.8 lb

## 2019-03-31 DIAGNOSIS — E7800 Pure hypercholesterolemia, unspecified: Secondary | ICD-10-CM | POA: Diagnosis not present

## 2019-03-31 DIAGNOSIS — E1165 Type 2 diabetes mellitus with hyperglycemia: Secondary | ICD-10-CM

## 2019-03-31 DIAGNOSIS — E039 Hypothyroidism, unspecified: Secondary | ICD-10-CM

## 2019-03-31 DIAGNOSIS — I8312 Varicose veins of left lower extremity with inflammation: Secondary | ICD-10-CM

## 2019-03-31 DIAGNOSIS — Z6841 Body Mass Index (BMI) 40.0 and over, adult: Secondary | ICD-10-CM

## 2019-03-31 DIAGNOSIS — I8311 Varicose veins of right lower extremity with inflammation: Secondary | ICD-10-CM

## 2019-03-31 DIAGNOSIS — Z0000 Encounter for general adult medical examination without abnormal findings: Secondary | ICD-10-CM

## 2019-03-31 LAB — POCT URINALYSIS DIPSTICK
Bilirubin, UA: NEGATIVE
Blood, UA: NEGATIVE
Glucose, UA: NEGATIVE
Ketones, UA: NEGATIVE
Leukocytes, UA: NEGATIVE
Nitrite, UA: NEGATIVE
Protein, UA: NEGATIVE
Spec Grav, UA: 1.02 (ref 1.010–1.025)
Urobilinogen, UA: 0.2 E.U./dL
pH, UA: 7.5 (ref 5.0–8.0)

## 2019-03-31 LAB — POCT UA - MICROALBUMIN
Albumin/Creatinine Ratio, Urine, POC: <30
Creatinine, POC: 200 mg/dL
Microalbumin Ur, POC: 30 mg/L

## 2019-03-31 MED ORDER — LEVOTHYROXINE SODIUM 112 MCG PO TABS: 112.0000 ug | ORAL_TABLET | Freq: Every day | ORAL | 1 refills | Status: DC

## 2019-03-31 MED ORDER — ROSUVASTATIN CALCIUM 5 MG PO TABS: 10.0000 mg | ORAL_TABLET | Freq: Every day | ORAL | 1 refills | Status: DC

## 2019-03-31 MED ORDER — METFORMIN HCL ER (MOD) 500 MG PO TB24: 500.0000 mg | ORAL_TABLET | Freq: Every day | ORAL | 1 refills | Status: DC

## 2019-03-31 MED ORDER — ROSUVASTATIN CALCIUM 10 MG PO TABS: 10.0000 mg | ORAL_TABLET | Freq: Every day | ORAL | 1 refills | Status: DC

## 2019-03-31 MED ORDER — METFORMIN HCL ER 500 MG PO TB24: 500.0000 mg | ORAL_TABLET | Freq: Every day | ORAL | 1 refills | Status: DC

## 2019-03-31 NOTE — Progress Notes (Signed)
Subjective:   Connor Becker. is a 68 y.o. male who presents for Medicare Annual/Subsequent preventive examination.  Review of Systems:  n/a Cardiac Risk Factors include: advanced age (>77men, >58 women);diabetes mellitus;dyslipidemia;hypertension;male gender;obesity (BMI >30kg/m2)     Objective:    Vitals: BP (!) 160/80 (BP Location: Left Arm, Patient Position: Sitting, Cuff Size: Normal)   Pulse 92   Temp 98.1 F (36.7 C) (Oral)   Ht 5' 11.2" (1.808 m)   Wt 298 lb 12.8 oz (135.5 kg)   SpO2 95%   BMI 41.44 kg/m   Body mass index is 41.44 kg/m.  Advanced Directives 03/31/2019 09/05/2016 07/11/2016 06/25/2016  Does Patient Have a Medical Advance Directive? No No No No  Would patient like information on creating a medical advance directive? - No - Patient declined - -    Tobacco Social History   Tobacco Use  Smoking Status Never Smoker  Smokeless Tobacco Never Used     Counseling given: Not Answered   Clinical Intake:  Pre-visit preparation completed: Yes  Pain : No/denies pain     Nutritional Status: BMI > 30  Obese Nutritional Risks: None Diabetes: Yes CBG done?: No Did pt. bring in CBG monitor from home?: No  How often do you need to have someone help you when you read instructions, pamphlets, or other written materials from your doctor or pharmacy?: 1 - Never What is the last grade level you completed in school?: college  Interpreter Needed?: No  Information entered by :: NAllen LPN  Past Medical History:  Diagnosis Date  . ALLERGIC RHINITIS   . Chronic pain of both knees   . DJD (degenerative joint disease) of knee    right  . DM2 (diabetes mellitus, type 2) ([place])   . GERD   . Glaucoma   . History of nuclear stress test    Myoview 12/17: EF 53, no ischemia, Low Risk  . HYPERLIPIDEMIA    myalgias from Lipitor  . HYPOGONADISM, MALE   . Hypothyroidism   . INSOMNIA-SLEEP DISORDER-UNSPEC   . Joint pain   . Obesity   . OSTEOARTHRITIS    . SLEEP APNEA, OBSTRUCTIVE    no CPAP; repeat sleep test pending 07/2016  . Unspecified hypothyroidism 12/04/2013  . Vitamin D deficiency    Past Surgical History:  Procedure Laterality Date  . NASAL SEPTUM SURGERY    . TONSILLECTOMY    . UVULOPALATOPHARYNGOPLASTY    . VASECTOMY     Family History  Problem Relation Age of Onset  . Diabetes Maternal Uncle   . Heart attack Maternal Uncle   . Hypertension Other   . Prostate cancer Other   . Heart disease Other   . Heart attack Mother 99       s/p MI  . Other Mother        pacemaker  . Diabetes Mother   . High blood pressure Mother   . High Cholesterol Mother   . Thyroid disease Mother   . Obesity Mother   . Thyroid disease Father   . Colon cancer Neg Hx    Social History   Socioeconomic History  . Marital status: Married    Spouse name: Travon Guandique  . Number of children: Not on file  . Years of education: Not on file  . Highest education level: Not on file  Occupational History  . Occupation: retired  Scientific laboratory technician  . Financial resource strain: Not hard at all  . Food insecurity  Worry: Never true    Inability: Never true  . Transportation needs    Medical: No    Non-medical: No  Tobacco Use  . Smoking status: Never Smoker  . Smokeless tobacco: Never Used  Substance and Sexual Activity  . Alcohol use: Not Currently    Comment: occassional beer  . Drug use: No  . Sexual activity: Yes  Lifestyle  . Physical activity    Days per week: 0 days    Minutes per session: 0 min  . Stress: Not at all  Relationships  . Social Herbalist on phone: Not on file    Gets together: Not on file    Attends religious service: Not on file    Active member of club or organization: Not on file    Attends meetings of clubs or organizations: Not on file    Relationship status: Not on file  Other Topics Concern  . 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    Outpatient Encounter Medications as of 03/31/2019  Medication Sig  . aspirin 325 MG tablet Take 325 mg by mouth daily.  Marland Kitchen ibuprofen (ADVIL,MOTRIN) 200 MG tablet Take 400 mg by mouth 2 (two) times daily as needed for headache or moderate pain.  Marland Kitchen latanoprost (XALATAN) 0.005 % ophthalmic solution 1 drop at bedtime.  Marland Kitchen levothyroxine (SYNTHROID) 112 MCG tablet Take 1 tablet by mouth once daily  . lisinopril (ZESTRIL) 5 MG tablet Take 1 tablet (5 mg total) by mouth daily.  . Magnesium 400 MG CAPS Take 400 mg by mouth at bedtime.  . methocarbamol (ROBAXIN) 750 MG tablet Take 750 mg by mouth 4 (four) times daily.  . Misc Natural Products (TUMERSAID PO) Take by mouth.  . Multiple Vitamins-Minerals (MULTIVITAMIN/EXTRA VITAMIN D3) CHEW Chew by mouth.  Marland Kitchen omeprazole (PRILOSEC) 20 MG capsule Take 20 mg by mouth daily.  Glory Rosebush VERIO test strip USE STRIP TO CHECK BLOOD SUGAR UP TO 4 TIMES PER DAY  . pseudoephedrine (SUDAFED) 30 MG tablet Take 60 mg by mouth 2 (two) times daily as needed for congestion.  . rosuvastatin (CRESTOR) 5 MG tablet Take 1 tablet (5 mg total) by mouth daily.  . tadalafil (CIALIS) 5 MG tablet Take 5 mg by mouth daily.  Marland Kitchen testosterone enanthate (DELATESTRYL) 200 MG/ML injection Inject 200 mg into the muscle every 30 (thirty) days. For IM use only  . timolol (TIMOPTIC) 0.5 % ophthalmic solution INSTILL 1 DROP INTO EACH EYE ONCE DAILY IN THE MORNING  . Turmeric 500 MG CAPS Take by mouth.  . vitamin C (ASCORBIC ACID) 500 MG tablet Take 1,000 mg by mouth daily.  Marland Kitchen RYVENT 6 MG TABS Take 6 mg by mouth 2 (two) times daily as needed. (Patient not taking: Reported on 02/03/2019)   No facility-administered encounter medications on file as of 03/31/2019.     Activities of Daily Living In your present state of health, do you have any difficulty performing the following activities: 03/31/2019 03/11/2019   Hearing? N N  Vision? N N  Difficulty concentrating or making decisions? N N  Walking or climbing stairs? N N  Dressing or bathing? N N  Doing errands, shopping? N N  Preparing Food and eating ? N -  Using the Toilet? N -  In the past six months, have  you accidently leaked urine? N -  Do you have problems with loss of bowel control? N -  Managing your Medications? N -  Managing your Finances? N -  Housekeeping or managing your Housekeeping? N -  Some recent data might be hidden    Patient Care Team: Glendale Chard, MD as PCP - General (Internal Medicine)   Assessment:   This is a routine wellness examination for Kristan.  Exercise Activities and Dietary recommendations Current Exercise Habits: The patient does not participate in regular exercise at present  Goals    . Weight (lb) < 200 lb (90.7 kg)     03/31/2019, wants to weigh 210-220 pounds       Fall Risk Fall Risk  03/31/2019 03/11/2019 02/03/2019 07/30/2018 07/01/2018  Falls in the past year? 0 0 0 0 0  Risk for fall due to : Medication side effect - - - -  Follow up Falls evaluation completed;Education provided;Falls prevention discussed - - - -   Is the patient's home free of loose throw rugs in walkways, pet beds, electrical cords, etc?   yes      Grab bars in the bathroom? no      Handrails on the stairs?   yes      Adequate lighting?   yes  Timed Get Up and Go Performed: n/a  Depression Screen PHQ 2/9 Scores 03/31/2019 02/03/2019 07/30/2018 07/01/2018  PHQ - 2 Score 0 0 0 0  PHQ- 9 Score 0 - - -  Exception Documentation - - - -    Cognitive Function     6CIT Screen 03/31/2019  What Year? 0 points  What month? 0 points  What time? 0 points  Count back from 20 0 points  Months in reverse 0 points  Repeat phrase 0 points  Total Score 0    Immunization History  Administered Date(s) Administered  . Tdap 10/31/2010    Qualifies for Shingles Vaccine? yes  Screening Tests Health Maintenance  Topic  Date Due  . OPHTHALMOLOGY EXAM  01/27/1961  . PNA vac Low Risk Adult (1 of 2 - PCV13) 01/26/2014  . INFLUENZA VACCINE  10/14/2019 (Originally 02/14/2019)  . HEMOGLOBIN A1C  09/11/2019  . FOOT EXAM  03/10/2020  . TETANUS/TDAP  10/30/2020  . COLONOSCOPY  07/11/2021  . Hepatitis C Screening  Completed   Cancer Screenings: Lung: Low Dose CT Chest recommended if Age 33-80 years, 30 pack-year currently smoking OR have quit w/in 15years. Patient does not qualify. Colorectal: up to date  Additional Screenings:  Hepatitis C Screening:11/2015      Plan:    Patient wants to weigh 210-220 pounds.  I have personally reviewed and noted the following in the patient's chart:   . Medical and social history . Use of alcohol, tobacco or illicit drugs  . Current medications and supplements . Functional ability and status . Nutritional status . Physical activity . Advanced directives . List of other physicians . Hospitalizations, surgeries, and ER visits in previous 12 months . Vitals . Screenings to include cognitive, depression, and falls . Referrals and appointments  In addition, I have reviewed and discussed with patient certain preventive protocols, quality metrics, and best practice recommendations. A written personalized care plan for preventive services as well as general preventive health recommendations were provided to patient.     Kellie Simmering, LPN  [id number]

## 2019-03-31 NOTE — Patient Instructions (Signed)
[name] , Thank you for taking time to come for your Medicare Wellness Visit. I appreciate your ongoing commitment to your health goals. Please review the following plan we discussed and let me know if I can assist you in the future.   Screening recommendations/referrals: Colonoscopy: 06/2016 Recommended yearly ophthalmology/optometry visit for glaucoma screening and checkup Recommended yearly dental visit for hygiene and checkup  Vaccinations: Influenza vaccine: declines Pneumococcal vaccine: declines Tdap vaccine: 10/2010 Shingles vaccine: discussed    Advanced directives: Advance directive discussed with you today. Even though you declined this today please call our office should you change your mind and we can give you the proper paperwork for you to fill out.   Conditions/risks identified: obesity  Next appointment: 06/09/2019 at 2:15  Preventive Care 70 Years and Older, Male Preventive care refers to lifestyle choices and visits with your health care provider that can promote health and wellness. What does preventive care include?  A yearly physical exam. This is also called an annual well check.  Dental exams once or twice a year.  Routine eye exams. Ask your health care provider how often you should have your eyes checked.  Personal lifestyle choices, including:  Daily care of your teeth and gums.  Regular physical activity.  Eating a healthy diet.  Avoiding tobacco and drug use.  Limiting alcohol use.  Practicing safe sex.  Taking low doses of aspirin every day.  Taking vitamin and mineral supplements as recommended by your health care provider. What happens during an annual well check? The services and screenings done by your health care provider during your annual well check will depend on your age, overall health, lifestyle risk factors, and family history of disease. Counseling  Your health care provider may ask you questions about your:  Alcohol  use.  Tobacco use.  Drug use.  Emotional well-being.  Home and relationship well-being.  Sexual activity.  Eating habits.  History of falls.  Memory and ability to understand (cognition).  Work and work Statistician. Screening  You may have the following tests or measurements:  Height, weight, and BMI.  Blood pressure.  Lipid and cholesterol levels. These may be checked every 5 years, or more frequently if you are over 70 years old.  Skin check.  Lung cancer screening. You may have this screening every year starting at age 70 if you have a 30-pack-year history of smoking and currently smoke or have quit within the past 15 years.  Fecal occult blood test (FOBT) of the stool. You may have this test every year starting at age 70.  Flexible sigmoidoscopy or colonoscopy. You may have a sigmoidoscopy every 5 years or a colonoscopy every 10 years starting at age 70.  Prostate cancer screening. Recommendations will vary depending on your family history and other risks.  Hepatitis C blood test.  Hepatitis B blood test.  Sexually transmitted disease (STD) testing.  Diabetes screening. This is done by checking your blood sugar (glucose) after you have not eaten for a while (fasting). You may have this done every 1-3 years.  Abdominal aortic aneurysm (AAA) screening. You may need this if you are a current or former smoker.  Osteoporosis. You may be screened starting at age 70 if you are at high risk. Talk with your health care provider about your test results, treatment options, and if necessary, the need for more tests. Vaccines  Your health care provider may recommend certain vaccines, such as:  Influenza vaccine. This is recommended every year.  Tetanus, diphtheria, and acellular pertussis (Tdap, Td) vaccine. You may need a Td booster every 10 years.  Zoster vaccine. You may need this after age 70.  Pneumococcal 13-valent conjugate (PCV13) vaccine. One dose is  recommended after age 70.  Pneumococcal polysaccharide (PPSV23) vaccine. One dose is recommended after age 70. Talk to your health care provider about which screenings and vaccines you need and how often you need them. This information is not intended to replace advice given to you by your health care provider. Make sure you discuss any questions you have with your health care provider. Document Released: 07/29/2015 Document Revised: 03/21/2016 Document Reviewed: 05/03/2015 Elsevier Interactive Patient Education  2017 [place] Prevention in the Home Falls can cause injuries. They can happen to people of all ages. There are many things you can do to make your home safe and to help prevent falls. What can I do on the outside of my home?  Regularly fix the edges of walkways and driveways and fix any cracks.  Remove anything that might make you trip as you walk through a door, such as a raised step or threshold.  Trim any bushes or trees on the path to your home.  Use bright outdoor lighting.  Clear any walking paths of anything that might make someone trip, such as rocks or tools.  Regularly check to see if handrails are loose or broken. Make sure that both sides of any steps have handrails.  Any raised decks and porches should have guardrails on the edges.  Have any leaves, snow, or ice cleared regularly.  Use sand or salt on walking paths during winter.  Clean up any spills in your garage right away. This includes oil or grease spills. What can I do in the bathroom?  Use night lights.  Install grab bars by the toilet and in the tub and shower. Do not use towel bars as grab bars.  Use non-skid mats or decals in the tub or shower.  If you need to sit down in the shower, use a plastic, non-slip stool.  Keep the floor dry. Clean up any water that spills on the floor as soon as it happens.  Remove soap buildup in the tub or shower regularly.  Attach bath mats  securely with double-sided non-slip rug tape.  Do not have throw rugs and other things on the floor that can make you trip. What can I do in the bedroom?  Use night lights.  Make sure that you have a light by your bed that is easy to reach.  Do not use any sheets or blankets that are too big for your bed. They should not hang down onto the floor.  Have a firm chair that has side arms. You can use this for support while you get dressed.  Do not have throw rugs and other things on the floor that can make you trip. What can I do in the kitchen?  Clean up any spills right away.  Avoid walking on wet floors.  Keep items that you use a lot in easy-to-reach places.  If you need to reach something above you, use a strong step stool that has a grab bar.  Keep electrical cords out of the way.  Do not use floor polish or wax that makes floors slippery. If you must use wax, use non-skid floor wax.  Do not have throw rugs and other things on the floor that can make you trip. What can I do  with my stairs?  Do not leave any items on the stairs.  Make sure that there are handrails on both sides of the stairs and use them. Fix handrails that are broken or loose. Make sure that handrails are as long as the stairways.  Check any carpeting to make sure that it is firmly attached to the stairs. Fix any carpet that is loose or worn.  Avoid having throw rugs at the top or bottom of the stairs. If you do have throw rugs, attach them to the floor with carpet tape.  Make sure that you have a light switch at the top of the stairs and the bottom of the stairs. If you do not have them, ask someone to add them for you. What else can I do to help prevent falls?  Wear shoes that:  Do not have high heels.  Have rubber bottoms.  Are comfortable and fit you well.  Are closed at the toe. Do not wear sandals.  If you use a stepladder:  Make sure that it is fully opened. Do not climb a closed  stepladder.  Make sure that both sides of the stepladder are locked into place.  Ask someone to hold it for you, if possible.  Clearly mark and make sure that you can see:  Any grab bars or handrails.  First and last steps.  Where the edge of each step is.  Use tools that help you move around (mobility aids) if they are needed. These include:  Canes.  Walkers.  Scooters.  Crutches.  Turn on the lights when you go into a dark area. Replace any light bulbs as soon as they burn out.  Set up your furniture so you have a clear path. Avoid moving your furniture around.  If any of your floors are uneven, fix them.  If there are any pets around you, be aware of where they are.  Review your medicines with your doctor. Some medicines can make you feel dizzy. This can increase your chance of falling. Ask your doctor what other things that you can do to help prevent falls. This information is not intended to replace advice given to you by your health care provider. Make sure you discuss any questions you have with your health care provider. Document Released: 04/28/2009 Document Revised: 12/08/2015 Document Reviewed: 08/06/2014 Elsevier Interactive Patient Education  2017 [employer].

## 2019-03-31 NOTE — Progress Notes (Signed)
Subjective:     Patient ID: Connor Becker , male    DOB: Dec 04, 1950 , 68 y.o.   MRN: [id number]   Chief Complaint  Patient presents with  . Hyperlipidemia    DID NOT INCREASE MEDICATION  . Thyroid Problem    LAST LABS    HPI  He is here today for a cholesterol check. After his last visit, he was advised to increase his rosuvastatin to TWO 5mg  tablets daily due to elevated LDL.  His results and explanation of his results were sent to his Mychart account. He unfortunately was not able to open his account.  Therefore, he did not make any of the medication changes suggested to him. He is upset because I did not know that he was not able to log into his account. He admits that he did not contact the office when he had trouble accessing his information. He reports that since we switched to Epic he has been unhappy with our services.   Hyperlipidemia  Thyroid Problem His past medical history is significant for hyperlipidemia.     Past Medical History:  Diagnosis Date  . ALLERGIC RHINITIS   . Chronic pain of both knees   . DJD (degenerative joint disease) of knee    right  . DM2 (diabetes mellitus, type 2) ([place])   . GERD   . Glaucoma   . History of nuclear stress test    Myoview 12/17: EF 53, no ischemia, Low Risk  . HYPERLIPIDEMIA    myalgias from Lipitor  . HYPOGONADISM, MALE   . Hypothyroidism   . INSOMNIA-SLEEP DISORDER-UNSPEC   . Joint pain   . Obesity   . OSTEOARTHRITIS   . SLEEP APNEA, OBSTRUCTIVE    no CPAP; repeat sleep test pending 07/2016  . Unspecified hypothyroidism 12/04/2013  . Vitamin D deficiency      Family History  Problem Relation Age of Onset  . Diabetes Maternal Uncle   . Heart attack Maternal Uncle   . Hypertension Other   . Prostate cancer Other   . Heart disease Other   . Heart attack Mother 48       s/p MI  . Other Mother        pacemaker  . Diabetes Mother   . High blood pressure Mother   . High Cholesterol Mother   . Thyroid  disease Mother   . Obesity Mother   . Thyroid disease Father   . Colon cancer Neg Hx      Current Outpatient Medications:  .  aspirin 325 MG tablet, Take 325 mg by mouth daily., Disp: , Rfl:  .  ibuprofen (ADVIL,MOTRIN) 200 MG tablet, Take 400 mg by mouth 2 (two) times daily as needed for headache or moderate pain., Disp: , Rfl:  .  latanoprost (XALATAN) 0.005 % ophthalmic solution, 1 drop at bedtime., Disp: , Rfl:  .  levothyroxine (SYNTHROID) 112 MCG tablet, Take 1 tablet (112 mcg total) by mouth daily., Disp: 90 tablet, Rfl: 1 .  lisinopril (ZESTRIL) 5 MG tablet, Take 1 tablet (5 mg total) by mouth daily., Disp: 90 tablet, Rfl: 1 .  Magnesium 400 MG CAPS, Take 400 mg by mouth at bedtime., Disp: 90 capsule, Rfl: 1 .  metFORMIN (GLUCOPHAGE-XR) 500 MG 24 hr tablet, Take 1 tablet (500 mg total) by mouth daily with breakfast., Disp: 30 tablet, Rfl: 1 .  methocarbamol (ROBAXIN) 750 MG tablet, Take 750 mg by mouth 4 (four) times daily., Disp: , Rfl:  .  Misc Natural Products (TUMERSAID PO), Take by mouth., Disp: , Rfl:  .  Multiple Vitamins-Minerals (MULTIVITAMIN/EXTRA VITAMIN D3) CHEW, Chew by mouth., Disp: , Rfl:  .  omeprazole (PRILOSEC) 20 MG capsule, Take 20 mg by mouth daily., Disp: , Rfl:  .  ONETOUCH VERIO test strip, USE STRIP TO CHECK BLOOD SUGAR UP TO 4 TIMES PER DAY, Disp: 200 each, Rfl: 11 .  pseudoephedrine (SUDAFED) 30 MG tablet, Take 60 mg by mouth 2 (two) times daily as needed for congestion., Disp: , Rfl:  .  rosuvastatin (CRESTOR) 10 MG tablet, Take 1 tablet (10 mg total) by mouth at bedtime., Disp: 90 tablet, Rfl: 1 .  RYVENT 6 MG TABS, Take 6 mg by mouth 2 (two) times daily as needed. (Patient not taking: Reported on 02/03/2019), Disp: 20 tablet, Rfl: 1 .  tadalafil (CIALIS) 5 MG tablet, Take 5 mg by mouth daily., Disp: , Rfl: 11 .  testosterone enanthate (DELATESTRYL) 200 MG/ML injection, Inject 200 mg into the muscle every 30 (thirty) days. For IM use only, Disp: , Rfl:  .   timolol (TIMOPTIC) 0.5 % ophthalmic solution, INSTILL 1 DROP INTO EACH EYE ONCE DAILY IN THE MORNING, Disp: , Rfl: 2 .  Turmeric 500 MG CAPS, Take by mouth., Disp: , Rfl:  .  vitamin C (ASCORBIC ACID) 500 MG tablet, Take 1,000 mg by mouth daily., Disp: , Rfl:    Allergies  Allergen Reactions  . Horseradish [Armoracia Rusticana Ext (Horseradish)]   . Lipitor [Atorvastatin] Other (See Comments)    Leg cramps  . Metformin And Related Other (See Comments)    GI upset  . Rye Grass Flower Pollen Extract [Gramineae Pollens]   . Soy Allergy      Review of Systems  Constitutional: Negative.   Respiratory: Negative.   Cardiovascular: Negative.   Gastrointestinal: Negative.   Neurological: Negative.   Psychiatric/Behavioral: Negative.      Today's Vitals   03/31/19 1000  BP: 128/80  Pulse: 92  Temp: 98.1 F (36.7 C)  TempSrc: Oral  SpO2: 95%  Weight: 298 lb 12.8 oz (135.5 kg)  Height: 5' 11.2" (1.808 m)   Body mass index is 41.44 kg/m.   Objective:  Physical Exam Vitals signs and nursing note reviewed.  Constitutional:      Appearance: Normal appearance.  Cardiovascular:     Rate and Rhythm: Normal rate and regular rhythm.     Heart sounds: Normal heart sounds.     Comments: Bilateral varicose veins of LE Pulmonary:     Effort: Pulmonary effort is normal.     Breath sounds: Normal breath sounds.  Musculoskeletal:     Right lower leg: 1+ Edema present.     Left lower leg: 1+ Edema present.  Skin:    General: Skin is warm.  Neurological:     General: No focal deficit present.     Mental Status: He is alert.  Psychiatric:        Mood and Affect: Mood normal.         Assessment And Plan:     1. Pure hypercholesterolemia  I will not check repeat labs today. I will send rx for 10mg  rosuvastatin to the pharmacy. He has upcoming physical scheduled in November 2020. He is advised to go home and speak to his wife to determine if he wants to keep this appointment. I did  offer to refer him to another practice that may better meet his needs. He declines at this time.   -  rosuvastatin (CRESTOR) 10 MG tablet; Take 1 tablet (10 mg total) by mouth at bedtime.  Dispense: 90 tablet; Refill: 1  2. Primary hypothyroidism  Chronic. He was given refill of his medication as requested.   - levothyroxine (SYNTHROID) 112 MCG tablet; Take 1 tablet (112 mcg total) by mouth daily.  Dispense: 90 tablet; Refill: 1  3. Varicose veins of both lower extremities with inflammation  Chronic. He is encouraged to wear compression hose. I will refer him to Vascular for further evaluation.   - Ambulatory referral to Vascular Surgery  4. Type 2 diabetes mellitus with hyperglycemia, without long-term current use of insulin ([place])  He is now motivated to make a stronger effort to get his diabetes under control. He has not tolerated metformin in the past. He is interested in the long-acting formulation. He is advised to take with heaviest meal of the day. He is also encouraged to schedule eye exam. He will think about use of GLP-1 agent to help him achieve glycemic control. I also offered to refer him to endocrinologist for further evaluation. He declines at this time.   - metFORMIN (GLUCOPHAGE-XR) 500 MG 24 hr tablet; Take 1 tablet (500 mg total) by mouth daily with breakfast.  Dispense: 30 tablet; Refill: 1  5. Class 3 severe obesity due to excess calories with serious comorbidity and body mass index (BMI) of 40.0 to 44.9 in adult [facility])  Importance of achieving optimal weight to decrease risk of cardiovascular disease and cancers was discussed with the patient in full detail. Importance of regular exercise was discussed with the patient.  He is encouraged to start slowly - start with 10 minutes twice daily at least three to four days per week and to gradually build to 30 minutes five days weekly. He was given tips to incorporate more activity into her daily routine - take stairs when  possible, park farther away from his job, grocery stores, etc.          Maximino Greenland, MD    THE PATIENT IS ENCOURAGED TO PRACTICE SOCIAL DISTANCING DUE TO THE COVID-19 PANDEMIC.

## 2019-03-31 NOTE — Addendum Note (Signed)
Addended by: Glenna Durand E on: 03/31/2019 12:31 PM   Modules accepted: Orders

## 2019-04-10 DIAGNOSIS — E119 Type 2 diabetes mellitus without complications: Secondary | ICD-10-CM | POA: Diagnosis not present

## 2019-04-10 DIAGNOSIS — H5213 Myopia, bilateral: Secondary | ICD-10-CM | POA: Diagnosis not present

## 2019-04-10 DIAGNOSIS — H401133 Primary open-angle glaucoma, bilateral, severe stage: Secondary | ICD-10-CM | POA: Diagnosis not present

## 2019-04-10 DIAGNOSIS — H47233 Glaucomatous optic atrophy, bilateral: Secondary | ICD-10-CM | POA: Diagnosis not present

## 2019-04-10 DIAGNOSIS — H2513 Age-related nuclear cataract, bilateral: Secondary | ICD-10-CM | POA: Diagnosis not present

## 2019-04-10 LAB — HM DIABETES EYE EXAM

## 2019-04-16 ENCOUNTER — Encounter: Payer: Medicare Other | Admitting: Internal Medicine

## 2019-04-16 ENCOUNTER — Ambulatory Visit: Payer: Medicare Other

## 2019-04-19 ENCOUNTER — Encounter: Payer: Self-pay | Admitting: Internal Medicine

## 2019-04-27 ENCOUNTER — Other Ambulatory Visit: Payer: Self-pay

## 2019-04-27 DIAGNOSIS — I83893 Varicose veins of bilateral lower extremities with other complications: Secondary | ICD-10-CM

## 2019-04-29 ENCOUNTER — Ambulatory Visit (HOSPITAL_COMMUNITY)
Admission: RE | Admit: 2019-04-29 | Discharge: 2019-04-29 | Disposition: A | Discharge status: Home / Self Care | Payer: Medicare Other | Source: Ambulatory Visit | Attending: Family | Admitting: Family

## 2019-04-29 ENCOUNTER — Ambulatory Visit (INDEPENDENT_AMBULATORY_CARE_PROVIDER_SITE_OTHER): Payer: Medicare Other | Admitting: Physician Assistant

## 2019-04-29 ENCOUNTER — Other Ambulatory Visit: Payer: Self-pay

## 2019-04-29 VITALS — BP 128/92 | HR 74 | Temp 97.9°F | Resp 14 | Ht 73.0 in | Wt 299.1 lb

## 2019-04-29 DIAGNOSIS — M7989 Other specified soft tissue disorders: Secondary | ICD-10-CM | POA: Diagnosis not present

## 2019-04-29 DIAGNOSIS — I83893 Varicose veins of bilateral lower extremities with other complications: Secondary | ICD-10-CM | POA: Insufficient documentation

## 2019-04-29 NOTE — Progress Notes (Signed)
[facility]           OF [place]  History and Physical   Connor Becker. is a 68 y.o. ([birth year]) male who presents with hx of BLE varicose veins.  The pt does not have hx of DVT.  The pt does have hx of varicose veins or ulcers.   There are not skin changes present.  The pt does have family hx of venous issues with mother and father. .  The pt has tried compression socks.  He has hx of HLD, DM on metformin, obesity, hypothyroidism.  He also has some problems with his knees and needs knee replacement.  He has known hx of bilateral bakers cyst, which does cause his some pain.    His mother passed away at age 75 and had hx of PAD and varicose veins.  His father is living at age 30 and has hx of DVT from when he was a truck Geophysicist/field seismologist.    The pt is on a statin for cholesterol management.  The pt is on a daily aspirin.   Other AC:  none The pt is on ACEI for hypertension.   The pt is diabetic.   Tobacco hx:  never  Past Medical History:  Diagnosis Date   ALLERGIC RHINITIS    Chronic pain of both knees    DJD (degenerative joint disease) of knee    right   DM2 (diabetes mellitus, type 2) ([facility])    GERD    Glaucoma    History of nuclear stress test    Myoview 12/17: EF 53, no ischemia, Low Risk   HYPERLIPIDEMIA    myalgias from Lipitor   HYPOGONADISM, MALE    Hypothyroidism    INSOMNIA-SLEEP DISORDER-UNSPEC    Joint pain    Obesity    OSTEOARTHRITIS    SLEEP APNEA, OBSTRUCTIVE    no CPAP; repeat sleep test pending 07/2016   Unspecified hypothyroidism 12/04/2013   Vitamin D deficiency     Past Surgical History:  Procedure Laterality Date   NASAL SEPTUM SURGERY     TONSILLECTOMY     UVULOPALATOPHARYNGOPLASTY     VASECTOMY      Social History   Socioeconomic History   Marital status: Married    Spouse name: Rylon Janey   Number of children: Not on file   Years of education: Not on file   Highest education  level: Not on file  Occupational History   Occupation: retired  Scientist, product/process development strain: Not hard at [employer]    Worry: Never true    Inability: Never true   Transportation needs    Medical: No    Non-medical: No  Tobacco Use   Smoking status: Never Smoker   Smokeless tobacco: Never Used  Substance and Sexual Activity   Alcohol use: Not Currently    Comment: occassional beer   Drug use: No   Sexual activity: Yes  Lifestyle   Physical activity    Days per week: 0 days    Minutes per session: 0 min   Stress: Not at all  Relationships   Social connections    Talks on phone: Not on file    Gets together: Not on file    Attends religious service: Not on file    Active member of club or organization: Not on file    Attends meetings of clubs or organizations: Not on  file    Relationship status: Not on file   Intimate partner violence    Fear of current or ex partner: No    Emotionally abused: No    Physically abused: No    Forced sexual activity: No  Other Topics Concern   Not on file  Social History Narrative   Married    2 children   Semi-retired   Architect; gen Chief Strategy Officer   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     Family History  Problem Relation Age of Onset   Diabetes Maternal Uncle    Heart attack Maternal Uncle    Hypertension Other    Prostate cancer Other    Heart disease Other    Heart attack Mother 74       s/p MI   Other Mother        pacemaker   Diabetes Mother    High blood pressure Mother    High Cholesterol Mother    Thyroid disease Mother    Obesity Mother    Thyroid disease Father    Colon cancer Neg Hx     Current Outpatient Medications  Medication Sig Dispense Refill   aspirin 325 MG tablet Take 325 mg by mouth daily.     ibuprofen (ADVIL,MOTRIN) 200 MG tablet Take 400 mg by mouth 2 (two) times daily as needed for headache or moderate pain.       latanoprost (XALATAN) 0.005 % ophthalmic solution 1 drop at bedtime.     levothyroxine (SYNTHROID) 112 MCG tablet Take 1 tablet (112 mcg total) by mouth daily. 90 tablet 1   lisinopril (ZESTRIL) 5 MG tablet Take 1 tablet (5 mg total) by mouth daily. 90 tablet 1   Magnesium 400 MG CAPS Take 400 mg by mouth at bedtime. 90 capsule 1   metFORMIN (GLUCOPHAGE-XR) 500 MG 24 hr tablet Take 1 tablet (500 mg total) by mouth daily with breakfast. 30 tablet 1   methocarbamol (ROBAXIN) 750 MG tablet Take 750 mg by mouth 4 (four) times daily.     Misc Natural Products (TUMERSAID PO) Take by mouth.     Multiple Vitamins-Minerals (MULTIVITAMIN/EXTRA VITAMIN D3) CHEW Chew by mouth.     omeprazole (PRILOSEC) 20 MG capsule Take 20 mg by mouth daily.     ONETOUCH VERIO test strip USE STRIP TO CHECK BLOOD SUGAR UP TO 4 TIMES PER DAY 200 each 11   pseudoephedrine (SUDAFED) 30 MG tablet Take 60 mg by mouth 2 (two) times daily as needed for congestion.     rosuvastatin (CRESTOR) 10 MG tablet Take 1 tablet (10 mg total) by mouth at bedtime. 90 tablet 1   RYVENT 6 MG TABS Take 6 mg by mouth 2 (two) times daily as needed. (Patient not taking: Reported on 02/03/2019) 20 tablet 1   tadalafil (CIALIS) 5 MG tablet Take 5 mg by mouth daily.  11   testosterone enanthate (DELATESTRYL) 200 MG/ML injection Inject 200 mg into the muscle every 30 (thirty) days. For IM use only     timolol (TIMOPTIC) 0.5 % ophthalmic solution INSTILL 1 DROP INTO EACH EYE ONCE DAILY IN THE MORNING  2   Turmeric 500 MG CAPS Take by mouth.     vitamin C (ASCORBIC ACID) 500 MG tablet Take 1,000 mg by mouth daily.     No current facility-administered medications for this visit.     Allergies  Allergen Reactions   Horseradish [Armoracia Rusticana Ext (Horseradish)]  Lipitor [Atorvastatin] Other (See Comments)    Leg cramps   Metformin And Related Other (See Comments)    GI upset   Rye Grass Flower Pollen Extract [Gramineae  Pollens]    Soy Allergy     REVIEW OF SYSTEMS:   [X]  denotes positive finding, [ ]  denotes negative finding Cardiac  Comments:  Chest pain or chest pressure:    Shortness of breath upon exertion:    Short of breath when lying flat:    Irregular heart rhythm:        Vascular    Pain in calf, thigh, or hip brought on by ambulation:    Pain in feet at night that wakes you up from your sleep:  x   Blood clot in your veins:    Leg swelling:  x       Pulmonary    Oxygen at home:    Productive cough:     Wheezing:         Neurologic    Sudden weakness in arms or legs:     Sudden numbness in arms or legs:     Sudden onset of difficulty speaking or slurred speech:    Temporary loss of vision in one eye:     Problems with dizziness:  x       Gastrointestinal    Blood in stool:     Vomited blood:         Genitourinary    Burning when urinating:     Blood in urine:        Psychiatric    Major depression:         Hematologic    Bleeding problems:    Problems with blood clotting too easily:        Skin    Rashes or ulcers:        Constitutional    Fever or chills:      PHYSICAL EXAMINATION:  Today's Vitals   04/29/19 1456  BP: (!) 128/92  Pulse: 74  Resp: 14  Temp: 97.9 F (36.6 C)  TempSrc: Temporal  SpO2: 98%  Weight: 299 lb 1.6 oz (135.7 kg)  Height: 6\' 1"  (1.854 m)  PainSc: 2    Body mass index is 39.46 kg/m.   General:  WDWN in NAD; vital signs documented above Gait: Not observed HENT: WNL, normocephalic Pulmonary: normal non-labored breathing , without Rales, rhonchi,  wheezing Cardiac: regular HR; without carotid bruits Abdomen: soft, NT, no masses Skin: without rashes Vascular Exam/Pulses:  Right Left  Radial 2+ (normal) 2+ (normal)  Ulnar Unable to palpate  Unable to palpate   Popliteal Unable to palpate  Unable to palpate   DP 1+ (weak) 1+ (weak)  PT 2+ (normal) 2+ (normal)   Extremities: without ischemic changes, without  Gangrene , without cellulitis; without open wounds; varicosities present BLE with swelling and spider veins bilaterally.  Musculoskeletal: no muscle wasting or atrophy  Neurologic: A&O X 3;  No focal weakness or paresthesias are detected Psychiatric:  The pt has Normal affect.   Non-Invasive Vascular Imaging:   Venous duplex on 04/29/2019:  Lower Venous Reflux Study   +------+---------------+---------+-----------+----------+--------------+  RIGHT  Compressibility Phasicity Spontaneity Properties Thrombus Aging  +------+---------------+---------+-----------+----------+--------------+  CFV    Full            Yes       Yes                                    +------+---------------+---------+-----------+----------+--------------+  SFJ    Full            Yes       Yes                                    +------+---------------+---------+-----------+----------+--------------+  FV Mid Full            Yes       Yes                                    +------+---------------+---------+-----------+----------+--------------+  POP    Full            Yes       Yes                                    +------+---------------+---------+-----------+----------+--------------+  GSV    Full            Yes       Yes                                    +------+---------------+---------+-----------+----------+--------------+  SSV    Full            Yes       Yes                                    +------+---------------+---------+-----------+----------+--------------+  +------+---------------+---------+-----------+----------+--------------+  LEFT   Compressibility Phasicity Spontaneity Properties Thrombus Aging  +------+---------------+---------+-----------+----------+--------------+  CFV    Full            Yes       Yes                                    +------+---------------+---------+-----------+----------+--------------+  SFJ    Full            Yes       Yes                                     +------+---------------+---------+-----------+----------+--------------+  FV Mid Full            Yes       Yes                                    +------+---------------+---------+-----------+----------+--------------+  POP    Full            Yes       Yes                                    +------+---------------+---------+-----------+----------+--------------+  GSV    Full            Yes       Yes                                    +------+---------------+---------+-----------+----------+--------------+  SSV    Full            Yes       Yes                                    +------+---------------+---------+-----------+----------+--------------+  Venous Reflux Times Normal value < 0.5 sec +------------------------------+----------+---------+                                 Right (ms) Left (ms)  +------------------------------+----------+---------+  CFV                            2428.00    1892.00    +------------------------------+----------+---------+  Popliteal                      1254.00               +------------------------------+----------+---------+  GSV at Saphenofemoral junction 2274.00    1423.00    +------------------------------+----------+---------+  GSV prox thigh                 1012.00    3697.00    +------------------------------+----------+---------+  GSV mid thigh                  4306.00    5098.00    +------------------------------+----------+---------+  GSV dist thigh                 4254.00    4474.00    +------------------------------+----------+---------+  GSV at knee                    2949.00    4900.00    +------------------------------+----------+---------+  GSV prox calf                  3645.00    3132.00    +------------------------------+----------+---------+  GSV mid calf                   3227.00    6477.00    +------------------------------+----------+---------+  SSV origin                     1166.00    3059.00     +------------------------------+----------+---------+  SSV prox                       924.00     1020.00    +------------------------------+----------+---------+  SSV mid                        565.00                +------------------------------+----------+---------+  +------------------------------+----------+---------+  VEIN DIAMETERS:                Right (cm) Left (cm)  +------------------------------+----------+---------+  GSV at Saphenofemoral junction 0.524      0.470      +------------------------------+----------+---------+  GSV at prox thigh              0.435      0.301      +------------------------------+----------+---------+  GSV at mid thigh               0.260      0.342      +------------------------------+----------+---------+  GSV  at distal thigh            0.292      0.273      +------------------------------+----------+---------+  GSV at knee                    0.315      0.239      +------------------------------+----------+---------+  GSV prox calf                  0.347      0.344      +------------------------------+----------+---------+  GSV mid calf                   0.312      0.319      +------------------------------+----------+---------+  SSV origin                     0.292      0.255      +------------------------------+----------+---------+  SSV prox                       0.142      0.269      +------------------------------+----------+---------+  SSV mid                        0.178      0.201      +------------------------------+----------+---------+    Right Reflux Technical Findings: 6.75 cm right popliteal cyst. Left Reflux Technical Findings: 6.12 cm left popliteal cyst.   Summary: Right: No reflux was noted in the femoral vein in the thigh.  Abnormal reflux times were noted in the common femoral vein, popliteal vein, great saphenous vein at the saphenofemoral junction, great saphenous vein at the proximal thigh, great saphenous  vein at the mid thigh, great saphenous vein at the distal thigh,  great saphenous vein at the knee, great saphenous vein at the prox calf, great saphenous vein at the mid calf, origin of the small saphenous vein, proximal small saphenous vein, and mid small saphenous vein.  There is no evidence of deep vein thrombosis in the lower extremity within the CFV, FV, and POPV. There is no evidence of superficial venous thrombosis in the visualized GSV and SSV.   Left: No reflux was noted in the femoral vein in the thigh, and popliteal vein.  Abnormal reflux times were noted in the common femoral vein, great saphenous vein at the saphenofemoral junction, great saphenous vein at the proximal thigh, great saphenous vein at the mid thigh, great saphenous vein at the distal thigh, great saphenous  vein at the knee, great saphenous vein at the proximal calf, great saphenous vein at the mid calf, origin of the small saphenous vein, and prox small saphenous vein.  There is no evidence of deep vein thrombosis in the lower extremity within the CFV, FV, and POPV. There is no evidence of superficial venous thrombosis within the visualized GSV and SSV.   Suhas Kirkendoll. is a 68 y.o. male who presents with: BLE leg swelling.    Pt does have evidence of venous reflux bilaterally.  He does not have evidence of DVT.  He does have 6cm bakers cyst bilaterally that is known.   -recommended 20-2mmHg thigh high compression socks as well as leg elevation.  He was given information for Elastic Therapy.  He will also elevate his legs when possible. He will return in 3 months to see Dr. Scot Dock  or Dr. Oneida Alar for evaluation of his reflux.    Leontine Locket, [facility] 04/29/2019 11:08 AM  Clinic MD:  Oneida Alar

## 2019-05-07 ENCOUNTER — Encounter: Payer: Self-pay | Admitting: Vascular Surgery

## 2019-06-08 ENCOUNTER — Other Ambulatory Visit: Payer: Self-pay

## 2019-06-08 NOTE — Patient Outreach (Signed)
[facility]) Care Management  06/08/2019  Connor Becker 1951-06-11 [id number]   Medication Adherence call to Connor Becker HIPPA Compliant Voice message left with a call back number.Connor Becker is showing past due on Metformin Er 500 mg under [place].   [facility] Management Direct Dial [phone]  Fax [phone] [email or web address]

## 2019-06-09 ENCOUNTER — Ambulatory Visit (INDEPENDENT_AMBULATORY_CARE_PROVIDER_SITE_OTHER): Payer: Medicare Other | Admitting: Internal Medicine

## 2019-06-09 ENCOUNTER — Other Ambulatory Visit: Payer: Self-pay

## 2019-06-09 ENCOUNTER — Encounter: Payer: Self-pay | Admitting: Internal Medicine

## 2019-06-09 VITALS — BP 110/88 | HR 92 | Temp 98.1°F | Ht 73.0 in | Wt 303.2 lb

## 2019-06-09 DIAGNOSIS — Z0000 Encounter for general adult medical examination without abnormal findings: Secondary | ICD-10-CM

## 2019-06-09 DIAGNOSIS — E1165 Type 2 diabetes mellitus with hyperglycemia: Secondary | ICD-10-CM

## 2019-06-09 DIAGNOSIS — E291 Testicular hypofunction: Secondary | ICD-10-CM

## 2019-06-09 DIAGNOSIS — Z79899 Other long term (current) drug therapy: Secondary | ICD-10-CM | POA: Diagnosis not present

## 2019-06-09 DIAGNOSIS — Z6841 Body Mass Index (BMI) 40.0 and over, adult: Secondary | ICD-10-CM

## 2019-06-09 NOTE — Patient Instructions (Signed)

## 2019-06-10 LAB — HEMOGLOBIN A1C
Est. average glucose Bld gHb Est-mCnc: 143 mg/dL
Hgb A1c MFr Bld: 6.6 % — ABNORMAL HIGH (ref 4.8–5.6)

## 2019-06-10 LAB — HEPATIC FUNCTION PANEL
ALT: 18 IU/L (ref 0–44)
AST: 19 IU/L (ref 0–40)
Albumin: 4.4 g/dL (ref 3.8–4.8)
Alkaline Phosphatase: 68 IU/L (ref 39–117)
Bilirubin Total: 0.3 mg/dL (ref 0.0–1.2)
Bilirubin, Direct: 0.09 mg/dL (ref 0.00–0.40)
Total Protein: 7.2 g/dL (ref 6.0–8.5)

## 2019-06-10 LAB — BMP8+EGFR
BUN/Creatinine Ratio: 18 (ref 10–24)
BUN: 17 mg/dL (ref 8–27)
CO2: 23 mmol/L (ref 20–29)
Calcium: 8.8 mg/dL (ref 8.6–10.2)
Chloride: 101 mmol/L (ref 96–106)
Creatinine, Ser: 0.95 mg/dL (ref 0.76–1.27)
GFR calc Af Amer: 93 mL/min/{1.73_m2} (ref >59)
GFR calc non Af Amer: 81 mL/min/{1.73_m2} (ref >59)
Glucose: 91 mg/dL (ref 65–99)
Potassium: 4.4 mmol/L (ref 3.5–5.2)
Sodium: 140 mmol/L (ref 134–144)

## 2019-06-10 LAB — PSA: Prostate Specific Ag, Serum: 3.7 ng/mL (ref 0.0–4.0)

## 2019-06-10 LAB — TESTOSTERONE: Testosterone: 160 ng/dL — ABNORMAL LOW (ref 264–916)

## 2019-06-12 NOTE — Progress Notes (Signed)
Subjective:     Patient ID: Connor Becker , male    DOB: June 17, 1951 , 68 y.o.   MRN: [id number]   Chief Complaint  Patient presents with  . Annual Exam  . Diabetes    HPI  He is here today for a full physical examination. He is followed by Urology for his prostate exams. He has no specific concerns at this time.   Diabetes He presents for his follow-up diabetic visit. He has type 2 diabetes mellitus. There are no hypoglycemic associated symptoms. There are no diabetic associated symptoms. There are no hypoglycemic complications. Risk factors for coronary artery disease include diabetes mellitus, dyslipidemia, male sex, sedentary lifestyle and obesity. He is compliant with treatment most of the time. He is following a diabetic diet. He participates in exercise intermittently. His breakfast blood glucose is taken between 8-9 am. His breakfast blood glucose range is generally 130-140 mg/dl. Eye exam is not current.     Past Medical History:  Diagnosis Date  . ALLERGIC RHINITIS   . Chronic pain of both knees   . DJD (degenerative joint disease) of knee    right  . DM2 (diabetes mellitus, type 2) ([place])   . GERD   . Glaucoma   . History of nuclear stress test    Myoview 12/17: EF 53, no ischemia, Low Risk  . HYPERLIPIDEMIA    myalgias from Lipitor  . HYPOGONADISM, MALE   . Hypothyroidism   . INSOMNIA-SLEEP DISORDER-UNSPEC   . Joint pain   . Obesity   . OSTEOARTHRITIS   . SLEEP APNEA, OBSTRUCTIVE    no CPAP; repeat sleep test pending 07/2016  . Unspecified hypothyroidism 12/04/2013  . Vitamin D deficiency      Family History  Problem Relation Age of Onset  . Diabetes Maternal Uncle   . Heart attack Maternal Uncle   . Hypertension Other   . Prostate cancer Other   . Heart disease Other   . Heart attack Mother 39       s/p MI  . Other Mother        pacemaker  . Diabetes Mother   . High blood pressure Mother   . High Cholesterol Mother   . Thyroid disease Mother    . Obesity Mother   . Macular degeneration Father   . Thyroid disease Father   . Colon cancer Neg Hx      Current Outpatient Medications:  .  aspirin 325 MG tablet, Take 325 mg by mouth daily., Disp: , Rfl:  .  ibuprofen (ADVIL,MOTRIN) 200 MG tablet, Take 400 mg by mouth 2 (two) times daily as needed for headache or moderate pain., Disp: , Rfl:  .  latanoprost (XALATAN) 0.005 % ophthalmic solution, 1 drop at bedtime., Disp: , Rfl:  .  levothyroxine (SYNTHROID) 112 MCG tablet, Take 1 tablet (112 mcg total) by mouth daily., Disp: 90 tablet, Rfl: 1 .  lisinopril (ZESTRIL) 5 MG tablet, Take 1 tablet (5 mg total) by mouth daily., Disp: 90 tablet, Rfl: 1 .  Magnesium 400 MG CAPS, Take 400 mg by mouth at bedtime., Disp: 90 capsule, Rfl: 1 .  metFORMIN (GLUCOPHAGE-XR) 500 MG 24 hr tablet, Take 1 tablet (500 mg total) by mouth daily with breakfast., Disp: 30 tablet, Rfl: 1 .  methocarbamol (ROBAXIN) 750 MG tablet, Take 750 mg by mouth 4 (four) times daily. As needed, Disp: , Rfl:  .  Multiple Vitamins-Minerals (MULTIVITAMIN/EXTRA VITAMIN D3) CHEW, Chew by mouth., Disp: , Rfl:  .  omeprazole (PRILOSEC) 20 MG capsule, Take 20 mg by mouth daily., Disp: , Rfl:  .  ONETOUCH VERIO test strip, USE STRIP TO CHECK BLOOD SUGAR UP TO 4 TIMES PER DAY, Disp: 200 each, Rfl: 11 .  pseudoephedrine (SUDAFED) 30 MG tablet, Take 60 mg by mouth 2 (two) times daily as needed for congestion., Disp: , Rfl:  .  rosuvastatin (CRESTOR) 10 MG tablet, Take 1 tablet (10 mg total) by mouth at bedtime., Disp: 90 tablet, Rfl: 1 .  tadalafil (CIALIS) 5 MG tablet, Take 5 mg by mouth daily., Disp: , Rfl: 11 .  testosterone enanthate (DELATESTRYL) 200 MG/ML injection, Inject 200 mg into the muscle every 30 (thirty) days. For IM use only, Disp: , Rfl:  .  timolol (TIMOPTIC) 0.5 % ophthalmic solution, INSTILL 1 DROP INTO EACH EYE ONCE DAILY IN THE MORNING, Disp: , Rfl: 2 .  Turmeric 500 MG CAPS, Take by mouth., Disp: , Rfl:  .  vitamin  C (ASCORBIC ACID) 500 MG tablet, Take 1,000 mg by mouth daily., Disp: , Rfl:    Allergies  Allergen Reactions  . Horseradish [Armoracia Rusticana Ext (Horseradish)]   . Lipitor [Atorvastatin] Other (See Comments)    Leg cramps  . Metformin And Related Other (See Comments)    GI upset  . Rye Grass Flower Pollen Extract [Gramineae Pollens]   . Soy Allergy     Men's preventive visit. Patient Health Questionnaire (PHQ-2) is    Clinical Support from 03/31/2019 in [facility]  PHQ-2 Total Score  0    . Patient is on a healthy diet. Marital status: Married. Relevant history for alcohol use is:  Social History   Substance and Sexual Activity  Alcohol Use Not Currently   Comment: occassional beer  . Relevant history for tobacco use is:  Social History   Tobacco Use  Smoking Status Never Smoker  Smokeless Tobacco Never Used  .  Review of Systems  Constitutional: Negative.   HENT: Negative.   Eyes: Negative.   Respiratory: Negative.   Cardiovascular: Negative.   Endocrine: Negative.   Genitourinary: Negative.   Musculoskeletal: Negative.   Skin: Negative.   Allergic/Immunologic: Negative.   Neurological: Negative.   Hematological: Negative.   Psychiatric/Behavioral: Negative.      Today's Vitals   06/09/19 1437  BP: 110/88  Pulse: 92  Temp: 98.1 F (36.7 C)  TempSrc: Oral  Weight: (!) 303 lb 3.2 oz (137.5 kg)  Height: '6\' 1"'  (1.854 m)  PainSc: 0-No pain   Body mass index is 40 kg/m.   Objective:  Physical Exam Vitals signs and nursing note reviewed.  Constitutional:      Appearance: Normal appearance. He is obese.  HENT:     Head: Normocephalic and atraumatic.     Right Ear: Tympanic membrane, ear canal and external ear normal.     Left Ear: Tympanic membrane, ear canal and external ear normal.     Nose:     Comments: Deferred, masked .    Mouth/Throat:     Comments: Deferred, masked.  Eyes:     Extraocular Movements: Extraocular  movements intact.     Conjunctiva/sclera: Conjunctivae normal.     Pupils: Pupils are equal, round, and reactive to light.  Neck:     Musculoskeletal: Normal range of motion and neck supple.  Cardiovascular:     Rate and Rhythm: Normal rate and regular rhythm.     Pulses: Normal pulses.  Dorsalis pedis pulses are 2+ on the right side and 2+ on the left side.     Heart sounds: Normal heart sounds.  Pulmonary:     Effort: Pulmonary effort is normal.     Breath sounds: Normal breath sounds.  Chest:     Breasts:        Right: Normal. No swelling, bleeding, inverted nipple, mass or nipple discharge.        Left: Normal. No swelling, bleeding, inverted nipple, mass or nipple discharge.  Abdominal:     General: Bowel sounds are normal.     Palpations: Abdomen is soft.     Comments: Obese. Difficult to assess organomegaly.   Genitourinary:    Comments: Deferred, as per patient's request.  Musculoskeletal: Normal range of motion.  Feet:     Right foot:     Protective Sensation: 5 sites tested. 5 sites sensed.     Skin integrity: Callus and dry skin present.     Toenail Condition: Right toenails are abnormally thick.     Left foot:     Protective Sensation: 5 sites tested. 5 sites sensed.     Skin integrity: Callus and dry skin present.     Toenail Condition: Left toenails are abnormally thick.  Skin:    General: Skin is warm.  Neurological:     General: No focal deficit present.     Mental Status: He is alert.  Psychiatric:        Mood and Affect: Mood normal.        Behavior: Behavior normal.         Assessment And Plan:     1. Routine general medical examination at health care facility  A full exam was performed.  DRE deferred as per patient's request. PATIENT HAS BEEN ADVISED TO GET 30-45 MINUTES REGULAR EXERCISE NO LESS THAN FOUR TO FIVE DAYS PER WEEK - BOTH WEIGHTBEARING EXERCISES AND AEROBIC ARE RECOMMENDED.  SHE WAS ADVISED TO FOLLOW A HEALTHY DIET WITH AT LEAST  SIX FRUITS/VEGGIES PER DAY, DECREASE INTAKE OF RED MEAT, AND TO INCREASE FISH INTAKE TO TWO DAYS PER WEEK.  MEATS/FISH SHOULD NOT BE FRIED, BAKED OR BROILED IS PREFERABLE.  I SUGGEST WEARING SPF 50 SUNSCREEN ON EXPOSED PARTS AND ESPECIALLY WHEN IN THE DIRECT SUNLIGHT FOR AN EXTENDED PERIOD OF TIME.  PLEASE AVOID FAST FOOD RESTAURANTS AND INCREASE YOUR WATER INTAKE.   2. Type 2 diabetes mellitus with hyperglycemia, without long-term current use of insulin ([place])  Diabetic foot exam was performed. I DISCUSSED WITH THE PATIENT AT LENGTH REGARDING THE GOALS OF GLYCEMIC CONTROL AND POSSIBLE LONG-TERM COMPLICATIONS.  I  ALSO STRESSED THE IMPORTANCE OF COMPLIANCE WITH HOME GLUCOSE MONITORING, DIETARY RESTRICTIONS INCLUDING AVOIDANCE OF SUGARY DRINKS/PROCESSED FOODS,  ALONG WITH REGULAR EXERCISE.  I  ALSO STRESSED THE IMPORTANCE OF ANNUAL EYE EXAMS, SELF FOOT CARE AND COMPLIANCE WITH OFFICE VISITS.  - Hemoglobin A1c - BMP8+EGFR - EKG 12-Lead  3. Hypogonadism male  Chronic. He is on injectable testosterone as per Urology. I will check labs as listed below. Previous results reviewed, testosterone level was suboptimal. Pt advised to discuss further with Urology. I will be sure to forward results to his Urologist for their review.   - PSA - Testosterone, Total  4. Drug therapy  - Liver Profile  5. Class 3 severe obesity due to excess calories with serious comorbidity and body mass index (BMI) of 40.0 to 44.9 in adult [facility])  Importance of achieving optimal weight to decrease risk  of cardiovascular disease and cancers was discussed with the patient in full detail. Importance of regular exercise was discussed with the patient. He is encouraged to start slowly - start with 10 minutes twice daily at least three to four days per week and to gradually build to 30 minutes five days weekly. He was given tips to incorporate more activity into her daily routine - take stairs when possible, park farther away from his  job, grocery stores, etc.    Maximino Greenland, MD    THE PATIENT IS ENCOURAGED TO PRACTICE SOCIAL DISTANCING DUE TO THE COVID-19 PANDEMIC.

## 2019-06-17 ENCOUNTER — Other Ambulatory Visit: Payer: Self-pay

## 2019-06-17 NOTE — Patient Outreach (Signed)
[facility]) Care Management  06/17/2019  Konstanty Seidner Jun 06, 1951 [id number]   Medication Adherence call to Mr. Aydric Bent HIPPA Compliant Voice message left with a call back number. Mr. Stromain is showing past due on Ramipril 2.5 mg under [place].   [place] Management Direct Dial [phone]  Fax [phone] [email or web address]

## 2019-08-13 ENCOUNTER — Ambulatory Visit: Payer: Medicare Other | Admitting: Vascular Surgery

## 2019-08-19 DIAGNOSIS — I69998 Other sequelae following unspecified cerebrovascular disease: Secondary | ICD-10-CM | POA: Diagnosis not present

## 2019-08-19 DIAGNOSIS — H47233 Glaucomatous optic atrophy, bilateral: Secondary | ICD-10-CM | POA: Diagnosis not present

## 2019-08-19 DIAGNOSIS — H2513 Age-related nuclear cataract, bilateral: Secondary | ICD-10-CM | POA: Diagnosis not present

## 2019-08-19 DIAGNOSIS — H401133 Primary open-angle glaucoma, bilateral, severe stage: Secondary | ICD-10-CM | POA: Diagnosis not present

## 2019-09-10 ENCOUNTER — Ambulatory Visit: Payer: Medicare Other | Admitting: Internal Medicine

## 2019-10-07 ENCOUNTER — Encounter: Payer: Self-pay | Admitting: Internal Medicine

## 2019-10-07 ENCOUNTER — Ambulatory Visit (INDEPENDENT_AMBULATORY_CARE_PROVIDER_SITE_OTHER): Payer: Medicare Other | Admitting: Internal Medicine

## 2019-10-07 ENCOUNTER — Other Ambulatory Visit: Payer: Self-pay

## 2019-10-07 ENCOUNTER — Ambulatory Visit (INDEPENDENT_AMBULATORY_CARE_PROVIDER_SITE_OTHER): Payer: Medicare Other

## 2019-10-07 VITALS — BP 144/96 | HR 78 | Temp 97.6°F | Ht 73.0 in | Wt 301.0 lb

## 2019-10-07 VITALS — BP 144/96 | HR 78 | Temp 97.6°F | Ht 73.0 in | Wt 301.4 lb

## 2019-10-07 DIAGNOSIS — I10 Essential (primary) hypertension: Secondary | ICD-10-CM

## 2019-10-07 DIAGNOSIS — E559 Vitamin D deficiency, unspecified: Secondary | ICD-10-CM | POA: Diagnosis not present

## 2019-10-07 DIAGNOSIS — E7800 Pure hypercholesterolemia, unspecified: Secondary | ICD-10-CM

## 2019-10-07 DIAGNOSIS — E291 Testicular hypofunction: Secondary | ICD-10-CM

## 2019-10-07 DIAGNOSIS — E1165 Type 2 diabetes mellitus with hyperglycemia: Secondary | ICD-10-CM

## 2019-10-07 DIAGNOSIS — E039 Hypothyroidism, unspecified: Secondary | ICD-10-CM

## 2019-10-07 DIAGNOSIS — Z6839 Body mass index (BMI) 39.0-39.9, adult: Secondary | ICD-10-CM

## 2019-10-07 DIAGNOSIS — Z0000 Encounter for general adult medical examination without abnormal findings: Secondary | ICD-10-CM

## 2019-10-07 MED ORDER — LISINOPRIL 5 MG PO TABS: 5.0000 mg | ORAL_TABLET | Freq: Every day | ORAL | 1 refills | Status: DC

## 2019-10-07 MED ORDER — METFORMIN HCL ER 500 MG PO TB24: 500.0000 mg | ORAL_TABLET | Freq: Every day | ORAL | 1 refills | Status: DC

## 2019-10-07 MED ORDER — ROSUVASTATIN CALCIUM 10 MG PO TABS: 10.0000 mg | ORAL_TABLET | Freq: Every day | ORAL | 1 refills | Status: DC

## 2019-10-07 NOTE — Patient Instructions (Signed)
[name] , Thank you for taking time to come for your Medicare Wellness Visit. I appreciate your ongoing commitment to your health goals. Please review the following plan we discussed and let me know if I can assist you in the future.   Screening recommendations/referrals: Colonoscopy: 06/2016 Recommended yearly ophthalmology/optometry visit for glaucoma screening and checkup Recommended yearly dental visit for hygiene and checkup  Vaccinations: Influenza vaccine: declined Pneumococcal vaccine: declined Tdap vaccine: 10/2010 Shingles vaccine: discussed    Advanced directives: Advance directive discussed with you today. Even though you declined this today please call our office should you change your mind and we can give you the proper paperwork for you to fill out.  Conditions/risks identified: obesity  Next appointment:   Preventive Care 43 Years and Older, Male Preventive care refers to lifestyle choices and visits with your health care provider that can promote health and wellness. What does preventive care include?  A yearly physical exam. This is also called an annual well check.  Dental exams once or twice a year.  Routine eye exams. Ask your health care provider how often you should have your eyes checked.  Personal lifestyle choices, including:  Daily care of your teeth and gums.  Regular physical activity.  Eating a healthy diet.  Avoiding tobacco and drug use.  Limiting alcohol use.  Practicing safe sex.  Taking low doses of aspirin every day.  Taking vitamin and mineral supplements as recommended by your health care provider. What happens during an annual well check? The services and screenings done by your health care provider during your annual well check will depend on your age, overall health, lifestyle risk factors, and family history of disease. Counseling  Your health care provider may ask you questions about your:  Alcohol use.  Tobacco  use.  Drug use.  Emotional well-being.  Home and relationship well-being.  Sexual activity.  Eating habits.  History of falls.  Memory and ability to understand (cognition).  Work and work Statistician. Screening  You may have the following tests or measurements:  Height, weight, and BMI.  Blood pressure.  Lipid and cholesterol levels. These may be checked every 5 years, or more frequently if you are over 1 years old.  Skin check.  Lung cancer screening. You may have this screening every year starting at age 47 if you have a 30-pack-year history of smoking and currently smoke or have quit within the past 15 years.  Fecal occult blood test (FOBT) of the stool. You may have this test every year starting at age 28.  Flexible sigmoidoscopy or colonoscopy. You may have a sigmoidoscopy every 5 years or a colonoscopy every 10 years starting at age 9.  Prostate cancer screening. Recommendations will vary depending on your family history and other risks.  Hepatitis C blood test.  Hepatitis B blood test.  Sexually transmitted disease (STD) testing.  Diabetes screening. This is done by checking your blood sugar (glucose) after you have not eaten for a while (fasting). You may have this done every 1-3 years.  Abdominal aortic aneurysm (AAA) screening. You may need this if you are a current or former smoker.  Osteoporosis. You may be screened starting at age 33 if you are at high risk. Talk with your health care provider about your test results, treatment options, and if necessary, the need for more tests. Vaccines  Your health care provider may recommend certain vaccines, such as:  Influenza vaccine. This is recommended every year.  Tetanus, diphtheria,  and acellular pertussis (Tdap, Td) vaccine. You may need a Td booster every 10 years.  Zoster vaccine. You may need this after age 81.  Pneumococcal 13-valent conjugate (PCV13) vaccine. One dose is recommended after age  29.  Pneumococcal polysaccharide (PPSV23) vaccine. One dose is recommended after age 35. Talk to your health care provider about which screenings and vaccines you need and how often you need them. This information is not intended to replace advice given to you by your health care provider. Make sure you discuss any questions you have with your health care provider. Document Released: 07/29/2015 Document Revised: 03/21/2016 Document Reviewed: 05/03/2015 Elsevier Interactive Patient Education  2017 [employer] Prevention in the Home Falls can cause injuries. They can happen to people of all ages. There are many things you can do to make your home safe and to help prevent falls. What can I do on the outside of my home?  Regularly fix the edges of walkways and driveways and fix any cracks.  Remove anything that might make you trip as you walk through a door, such as a raised step or threshold.  Trim any bushes or trees on the path to your home.  Use bright outdoor lighting.  Clear any walking paths of anything that might make someone trip, such as rocks or tools.  Regularly check to see if handrails are loose or broken. Make sure that both sides of any steps have handrails.  Any raised decks and porches should have guardrails on the edges.  Have any leaves, snow, or ice cleared regularly.  Use sand or salt on walking paths during winter.  Clean up any spills in your garage right away. This includes oil or grease spills. What can I do in the bathroom?  Use night lights.  Install grab bars by the toilet and in the tub and shower. Do not use towel bars as grab bars.  Use non-skid mats or decals in the tub or shower.  If you need to sit down in the shower, use a plastic, non-slip stool.  Keep the floor dry. Clean up any water that spills on the floor as soon as it happens.  Remove soap buildup in the tub or shower regularly.  Attach bath mats securely with double-sided  non-slip rug tape.  Do not have throw rugs and other things on the floor that can make you trip. What can I do in the bedroom?  Use night lights.  Make sure that you have a light by your bed that is easy to reach.  Do not use any sheets or blankets that are too big for your bed. They should not hang down onto the floor.  Have a firm chair that has side arms. You can use this for support while you get dressed.  Do not have throw rugs and other things on the floor that can make you trip. What can I do in the kitchen?  Clean up any spills right away.  Avoid walking on wet floors.  Keep items that you use a lot in easy-to-reach places.  If you need to reach something above you, use a strong step stool that has a grab bar.  Keep electrical cords out of the way.  Do not use floor polish or wax that makes floors slippery. If you must use wax, use non-skid floor wax.  Do not have throw rugs and other things on the floor that can make you trip. What can I do with my  stairs?  Do not leave any items on the stairs.  Make sure that there are handrails on both sides of the stairs and use them. Fix handrails that are broken or loose. Make sure that handrails are as long as the stairways.  Check any carpeting to make sure that it is firmly attached to the stairs. Fix any carpet that is loose or worn.  Avoid having throw rugs at the top or bottom of the stairs. If you do have throw rugs, attach them to the floor with carpet tape.  Make sure that you have a light switch at the top of the stairs and the bottom of the stairs. If you do not have them, ask someone to add them for you. What else can I do to help prevent falls?  Wear shoes that:  Do not have high heels.  Have rubber bottoms.  Are comfortable and fit you well.  Are closed at the toe. Do not wear sandals.  If you use a stepladder:  Make sure that it is fully opened. Do not climb a closed stepladder.  Make sure that both  sides of the stepladder are locked into place.  Ask someone to hold it for you, if possible.  Clearly mark and make sure that you can see:  Any grab bars or handrails.  First and last steps.  Where the edge of each step is.  Use tools that help you move around (mobility aids) if they are needed. These include:  Canes.  Walkers.  Scooters.  Crutches.  Turn on the lights when you go into a dark area. Replace any light bulbs as soon as they burn out.  Set up your furniture so you have a clear path. Avoid moving your furniture around.  If any of your floors are uneven, fix them.  If there are any pets around you, be aware of where they are.  Review your medicines with your doctor. Some medicines can make you feel dizzy. This can increase your chance of falling. Ask your doctor what other things that you can do to help prevent falls. This information is not intended to replace advice given to you by your health care provider. Make sure you discuss any questions you have with your health care provider. Document Released: 04/28/2009 Document Revised: 12/08/2015 Document Reviewed: 08/06/2014 Elsevier Interactive Patient Education  2017 [employer].

## 2019-10-07 NOTE — Progress Notes (Signed)
This visit occurred during the SARS-CoV-2 public health emergency.  Safety protocols were in place, including screening questions prior to the visit, additional usage of staff PPE, and extensive cleaning of exam room while observing appropriate contact time as indicated for disinfecting solutions.  Subjective:   Connor Kwolek. is a 69 y.o. male who presents for Medicare Annual/Subsequent preventive examination.  Review of Systems:  n/a Cardiac Risk Factors include: advanced age (>95men, >93 women);diabetes mellitus;dyslipidemia;male gender;hypertension;obesity (BMI >30kg/m2);sedentary lifestyle     Objective:    Vitals: BP (!) 144/96 (BP Location: Left Arm, Patient Position: Sitting, Cuff Size: Large)   Pulse 78   Temp 97.6 F (36.4 C) (Oral)   Ht 6\' 1"  (1.854 m)   Wt (!) 301 lb (136.5 kg)   BMI 39.71 kg/m   Body mass index is 39.71 kg/m.  Advanced Directives 10/07/2019 03/31/2019 09/05/2016 07/11/2016 06/25/2016  Does Patient Have a Medical Advance Directive? No No No No No  Would patient like information on creating a medical advance directive? No - Patient declined - No - Patient declined - -    Tobacco Social History   Tobacco Use  Smoking Status Never Smoker  Smokeless Tobacco Never Used     Counseling given: Not Answered   Clinical Intake:  Pre-visit preparation completed: Yes  Pain : No/denies pain     Nutritional Status: BMI > 30  Obese Nutritional Risks: None Diabetes: Yes  How often do you need to have someone help you when you read instructions, pamphlets, or other written materials from your doctor or pharmacy?: 1 - Never What is the last grade level you completed in school?: college  Interpreter Needed?: No  Information entered by :: NAllen LPN  Past Medical History:  Diagnosis Date  . ALLERGIC RHINITIS   . Chronic pain of both knees   . DJD (degenerative joint disease) of knee    right  . DM2 (diabetes mellitus, type 2) ([place])   . GERD    . Glaucoma   . History of nuclear stress test    Myoview 12/17: EF 53, no ischemia, Low Risk  . HYPERLIPIDEMIA    myalgias from Lipitor  . HYPOGONADISM, MALE   . Hypothyroidism   . INSOMNIA-SLEEP DISORDER-UNSPEC   . Joint pain   . Obesity   . OSTEOARTHRITIS   . SLEEP APNEA, OBSTRUCTIVE    no CPAP; repeat sleep test pending 07/2016  . Unspecified hypothyroidism 12/04/2013  . Vitamin D deficiency    Past Surgical History:  Procedure Laterality Date  . NASAL SEPTUM SURGERY    . TONSILLECTOMY    . UVULOPALATOPHARYNGOPLASTY    . VASECTOMY     Family History  Problem Relation Age of Onset  . Diabetes Maternal Uncle   . Heart attack Maternal Uncle   . Hypertension Other   . Prostate cancer Other   . Heart disease Other   . Heart attack Mother 48       s/p MI  . Other Mother        pacemaker  . Diabetes Mother   . High blood pressure Mother   . High Cholesterol Mother   . Thyroid disease Mother   . Obesity Mother   . Macular degeneration Father   . Thyroid disease Father   . Colon cancer Neg Hx    Social History   Socioeconomic History  . Marital status: Married    Spouse name: Connor Becker  . Number of children: Not on file  .  Years of education: Not on file  . Highest education level: Not on file  Occupational History  . Occupation: retired  Tobacco Use  . Smoking status: Never Smoker  . Smokeless tobacco: Never Used  Substance and Sexual Activity  . Alcohol use: Not Currently    Comment: occassional beer  . Drug use: No  . Sexual activity: Yes  Other Topics Concern  . 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   Social Determinants of Health   Financial Resource Strain: Low Risk   . Difficulty of Paying Living Expenses: Not hard at all  Food Insecurity: No Food Insecurity  . Worried About Charity fundraiser in the  Last Year: Never true  . Ran Out of Food in the Last Year: Never true  Transportation Needs: No Transportation Needs  . Lack of Transportation (Medical): No  . Lack of Transportation (Non-Medical): No  Physical Activity: Inactive  . Days of Exercise per Week: 0 days  . Minutes of Exercise per Session: 0 min  Stress: No Stress Concern Present  . Feeling of Stress : Not at all  Social Connections:   . Frequency of Communication with Friends and Family:   . Frequency of Social Gatherings with Friends and Family:   . Attends Religious Services:   . Active Member of Clubs or Organizations:   . Attends Archivist Meetings:   Marland Kitchen Marital Status:     Outpatient Encounter Medications as of 10/07/2019  Medication Sig  . aspirin 325 MG tablet Take 325 mg by mouth daily.  . Ferrous Sulfate (IRON PO) Take by mouth.  Marland Kitchen ibuprofen (ADVIL,MOTRIN) 200 MG tablet Take 400 mg by mouth 2 (two) times daily as needed for headache or moderate pain.  Marland Kitchen latanoprost (XALATAN) 0.005 % ophthalmic solution 1 drop at bedtime.  Marland Kitchen levothyroxine (SYNTHROID) 112 MCG tablet Take 1 tablet (112 mcg total) by mouth daily.  Marland Kitchen lisinopril (ZESTRIL) 5 MG tablet Take 1 tablet (5 mg total) by mouth daily.  . Magnesium 400 MG CAPS Take 400 mg by mouth at bedtime.  . metFORMIN (GLUCOPHAGE-XR) 500 MG 24 hr tablet Take 1 tablet (500 mg total) by mouth daily with breakfast.  . methocarbamol (ROBAXIN) 750 MG tablet Take 750 mg by mouth 4 (four) times daily. As needed  . Multiple Vitamins-Minerals (MULTIVITAMIN/EXTRA VITAMIN D3) CHEW Chew by mouth.  Marland Kitchen omeprazole (PRILOSEC) 20 MG capsule Take 20 mg by mouth daily. prn  . ONETOUCH VERIO test strip USE STRIP TO CHECK BLOOD SUGAR UP TO 4 TIMES PER DAY  . pseudoephedrine (SUDAFED) 30 MG tablet Take 60 mg by mouth 2 (two) times daily as needed for congestion.  . rosuvastatin (CRESTOR) 10 MG tablet Take 1 tablet (10 mg total) by mouth at bedtime.  . tadalafil (CIALIS) 5 MG tablet  Take 5 mg by mouth daily.  Marland Kitchen testosterone enanthate (DELATESTRYL) 200 MG/ML injection Inject 200 mg into the muscle every 30 (thirty) days. For IM use only  . timolol (TIMOPTIC) 0.5 % ophthalmic solution INSTILL 1 DROP INTO EACH EYE ONCE DAILY IN THE MORNING  . Turmeric 500 MG CAPS Take by mouth.  . vitamin C (ASCORBIC ACID) 500 MG tablet Take 1,000 mg by mouth daily.   No facility-administered encounter medications on file as of 10/07/2019.    Activities of Daily  Living In your present state of health, do you have any difficulty performing the following activities: 10/07/2019 03/31/2019  Hearing? N N  Vision? N N  Difficulty concentrating or making decisions? N N  Walking or climbing stairs? N N  Dressing or bathing? N N  Doing errands, shopping? N N  Preparing Food and eating ? N N  Using the Toilet? N N  In the past six months, have you accidently leaked urine? Y N  Do you have problems with loss of bowel control? N N  Managing your Medications? N N  Managing your Finances? - N  Housekeeping or managing your Housekeeping? N N  Some recent data might be hidden    Patient Care Team: Glendale Chard, MD as PCP - General (Internal Medicine)   Assessment:   This is a routine wellness examination for Connor Becker.  Exercise Activities and Dietary recommendations Current Exercise Habits: The patient has a physically strenuous job, but has no regular exercise apart from work.  Goals    . Weight (lb) < 200 lb (90.7 kg)     03/31/2019, wants to weigh 210-220 pounds    . Weight (lb) < 200 lb (90.7 kg)     10/07/2019, wants to get down to 220 pounds       Fall Risk Fall Risk  10/07/2019 03/31/2019 03/11/2019 02/03/2019 07/30/2018  Falls in the past year? 0 0 0 0 0  Risk for fall due to : - Medication side effect - - -  Follow up Falls evaluation completed;Education provided;Falls prevention discussed Falls evaluation completed;Education provided;Falls prevention discussed - - -   Is the  patient's home free of loose throw rugs in walkways, pet beds, electrical cords, etc?   yes      Grab bars in the bathroom? no      Handrails on the stairs?   yes      Adequate lighting?   yes  Timed Get Up and Go Performed: n/a  Depression Screen PHQ 2/9 Scores 10/07/2019 03/31/2019 02/03/2019 07/30/2018  PHQ - 2 Score 0 0 0 0  PHQ- 9 Score 0 0 - -  Exception Documentation - - - -    Cognitive Function     6CIT Screen 10/07/2019 03/31/2019  What Year? 0 points 0 points  What month? 0 points 0 points  What time? 0 points 0 points  Count back from 20 0 points 0 points  Months in reverse 0 points 0 points  Repeat phrase 0 points 0 points  Total Score 0 0    Immunization History  Administered Date(s) Administered  . Tdap 10/31/2010    Qualifies for Shingles Vaccine? yes  Screening Tests Health Maintenance  Topic Date Due  . OPHTHALMOLOGY EXAM  Never done  . INFLUENZA VACCINE  10/14/2019 (Originally 02/14/2019)  . PNA vac Low Risk Adult (1 of 2 - PCV13) 03/30/2020 (Originally 01/26/2014)  . HEMOGLOBIN A1C  12/07/2019  . FOOT EXAM  06/08/2020  . TETANUS/TDAP  10/30/2020  . COLONOSCOPY  07/11/2021  . Hepatitis C Screening  Completed   Cancer Screenings: Lung: Low Dose CT Chest recommended if Age 40-80 years, 30 pack-year currently smoking OR have quit w/in 15years. Patient does not qualify. Colorectal: up to date  Additional Screenings:  Hepatitis C Screening:11/17/2015      Plan:    Patient wants to get down to 220 pounds.  I have personally reviewed and noted the following in the patient's chart:   . Medical and social history .  Use of alcohol, tobacco or illicit drugs  . Current medications and supplements . Functional ability and status . Nutritional status . Physical activity . Advanced directives . List of other physicians . Hospitalizations, surgeries, and ER visits in previous 12 months . Vitals . Screenings to include cognitive, depression, and  falls . Referrals and appointments  In addition, I have reviewed and discussed with patient certain preventive protocols, quality metrics, and best practice recommendations. A written personalized care plan for preventive services as well as general preventive health recommendations were provided to patient.     Kellie Simmering, LPN  [phone]

## 2019-10-07 NOTE — Patient Instructions (Signed)
Diabetes Mellitus and Exercise Exercising regularly is important for your overall health, especially when you have diabetes (diabetes mellitus). Exercising is not only about losing weight. It has many other health benefits, such as increasing muscle strength and bone density and reducing body fat and stress. This leads to improved fitness, flexibility, and endurance, all of which result in better overall health. Exercise has additional benefits for people with diabetes, including:  Reducing appetite.  Helping to lower and control blood glucose.  Lowering blood pressure.  Helping to control amounts of fatty substances (lipids) in the blood, such as cholesterol and triglycerides.  Helping the body to respond better to insulin (improving insulin sensitivity).  Reducing how much insulin the body needs.  Decreasing the risk for heart disease by: ? Lowering cholesterol and triglyceride levels. ? Increasing the levels of good cholesterol. ? Lowering blood glucose levels. What is my activity plan? Your health care provider or certified diabetes educator can help you make a plan for the type and frequency of exercise (activity plan) that works for you. Make sure that you:  Do at least 150 minutes of moderate-intensity or vigorous-intensity exercise each week. This could be brisk walking, biking, or water aerobics. ? Do stretching and strength exercises, such as yoga or weightlifting, at least 2 times a week. ? Spread out your activity over at least 3 days of the week.  Get some form of physical activity every day. ? Do not go more than 2 days in a row without some kind of physical activity. ? Avoid being inactive for more than 30 minutes at a time. Take frequent breaks to walk or stretch.  Choose a type of exercise or activity that you enjoy, and set realistic goals.  Start slowly, and gradually increase the intensity of your exercise over time. What do I need to know about managing my  diabetes?   Check your blood glucose before and after exercising. ? If your blood glucose is 240 mg/dL (13.3 mmol/L) or higher before you exercise, check your urine for ketones. If you have ketones in your urine, do not exercise until your blood glucose returns to normal. ? If your blood glucose is 100 mg/dL (5.6 mmol/L) or lower, eat a snack containing 15-20 grams of carbohydrate. Check your blood glucose 15 minutes after the snack to make sure that your level is above 100 mg/dL (5.6 mmol/L) before you start your exercise.  Know the symptoms of low blood glucose (hypoglycemia) and how to treat it. Your risk for hypoglycemia increases during and after exercise. Common symptoms of hypoglycemia can include: ? Hunger. ? Anxiety. ? Sweating and feeling clammy. ? Confusion. ? Dizziness or feeling light-headed. ? Increased heart rate or palpitations. ? Blurry vision. ? Tingling or numbness around the mouth, lips, or tongue. ? Tremors or shakes. ? Irritability.  Keep a rapid-acting carbohydrate snack available before, during, and after exercise to help prevent or treat hypoglycemia.  Avoid injecting insulin into areas of the body that are going to be exercised. For example, avoid injecting insulin into: ? The arms, when playing tennis. ? The legs, when jogging.  Keep records of your exercise habits. Doing this can help you and your health care provider adjust your diabetes management plan as needed. Write down: ? Food that you eat before and after you exercise. ? Blood glucose levels before and after you exercise. ? The type and amount of exercise you have done. ? When your insulin is expected to peak, if you use   insulin. Avoid exercising at times when your insulin is peaking.  When you start a new exercise or activity, work with your health care provider to make sure the activity is safe for you, and to adjust your insulin, medicines, or food intake as needed.  Drink plenty of water while  you exercise to prevent dehydration or heat stroke. Drink enough fluid to keep your urine clear or pale yellow. Summary  Exercising regularly is important for your overall health, especially when you have diabetes (diabetes mellitus).  Exercising has many health benefits, such as increasing muscle strength and bone density and reducing body fat and stress.  Your health care provider or certified diabetes educator can help you make a plan for the type and frequency of exercise (activity plan) that works for you.  When you start a new exercise or activity, work with your health care provider to make sure the activity is safe for you, and to adjust your insulin, medicines, or food intake as needed. This information is not intended to replace advice given to you by your health care provider. Make sure you discuss any questions you have with your health care provider. Document Revised: 01/24/2017 Document Reviewed: 12/12/2015 Elsevier Patient Education  2020 Elsevier Inc.  

## 2019-10-08 LAB — BMP8+EGFR
BUN/Creatinine Ratio: 21 (ref 10–24)
BUN: 20 mg/dL (ref 8–27)
CO2: 24 mmol/L (ref 20–29)
Calcium: 9 mg/dL (ref 8.6–10.2)
Chloride: 99 mmol/L (ref 96–106)
Creatinine, Ser: 0.94 mg/dL (ref 0.76–1.27)
GFR calc Af Amer: 95 mL/min/{1.73_m2} (ref >59)
GFR calc non Af Amer: 82 mL/min/{1.73_m2} (ref >59)
Glucose: 84 mg/dL (ref 65–99)
Potassium: 4.4 mmol/L (ref 3.5–5.2)
Sodium: 136 mmol/L (ref 134–144)

## 2019-10-08 LAB — TSH: TSH: 3.16 u[IU]/mL (ref 0.450–4.500)

## 2019-10-08 LAB — HEMOGLOBIN A1C
Est. average glucose Bld gHb Est-mCnc: 148 mg/dL
Hgb A1c MFr Bld: 6.8 % — ABNORMAL HIGH (ref 4.8–5.6)

## 2019-10-08 LAB — VITAMIN D 25 HYDROXY (VIT D DEFICIENCY, FRACTURES): Vit D, 25-Hydroxy: 19 ng/mL — ABNORMAL LOW (ref 30.0–100.0)

## 2019-10-09 ENCOUNTER — Telehealth: Payer: Self-pay

## 2019-10-09 ENCOUNTER — Other Ambulatory Visit: Payer: Self-pay | Admitting: Internal Medicine

## 2019-10-09 MED ORDER — VITAMIN D (ERGOCALCIFEROL) 1.25 MG (50000 UNIT) PO CAPS: 50000.0000 [IU] | ORAL_CAPSULE | ORAL | 0 refills | Status: DC

## 2019-10-09 NOTE — Telephone Encounter (Signed)
Left vm for pt to return call for lab results  

## 2019-10-10 NOTE — Progress Notes (Signed)
This visit occurred during the SARS-CoV-2 public health emergency.  Safety protocols were in place, including screening questions prior to the visit, additional usage of staff PPE, and extensive cleaning of exam room while observing appropriate contact time as indicated for disinfecting solutions.  Subjective:     Patient ID: Connor Becker , male    DOB: 1950-10-07 , 69 y.o.   MRN: [id number]   Chief Complaint  Patient presents with  . Hyperlipidemia  . Diabetes    HPI  He presents today for chol and DM check. He reports compliance with meds. He admits that he is not getting much exercise. He does add that he is working now, so that helps him to be more active.   Hyperlipidemia This is a chronic problem. Exacerbating diseases include diabetes, hypothyroidism and obesity. Pertinent negatives include no chest pain, leg pain or myalgias. Current antihyperlipidemic treatment includes statins. Compliance problems include adherence to exercise.  Risk factors for coronary artery disease include diabetes mellitus, dyslipidemia, hypertension, male sex and obesity.  Diabetes He presents for his follow-up diabetic visit. He has type 2 diabetes mellitus. There are no hypoglycemic associated symptoms. Pertinent negatives for diabetes include no blurred vision and no chest pain. There are no hypoglycemic complications. He is following a generally healthy diet. He participates in exercise intermittently.     Past Medical History:  Diagnosis Date  . ALLERGIC RHINITIS   . Chronic pain of both knees   . DJD (degenerative joint disease) of knee    right  . DM2 (diabetes mellitus, type 2) ([place])   . GERD   . Glaucoma   . History of nuclear stress test    Myoview 12/17: EF 53, no ischemia, Low Risk  . HYPERLIPIDEMIA    myalgias from Lipitor  . HYPOGONADISM, MALE   . Hypothyroidism   . INSOMNIA-SLEEP DISORDER-UNSPEC   . Joint pain   . Obesity   . OSTEOARTHRITIS   . SLEEP APNEA, OBSTRUCTIVE     no CPAP; repeat sleep test pending 07/2016  . Unspecified hypothyroidism 12/04/2013  . Vitamin D deficiency      Family History  Problem Relation Age of Onset  . Diabetes Maternal Uncle   . Heart attack Maternal Uncle   . Hypertension Other   . Prostate cancer Other   . Heart disease Other   . Heart attack Mother 55       s/p MI  . Other Mother        pacemaker  . Diabetes Mother   . High blood pressure Mother   . High Cholesterol Mother   . Thyroid disease Mother   . Obesity Mother   . Macular degeneration Father   . Thyroid disease Father   . Colon cancer Neg Hx      Current Outpatient Medications:  .  aspirin 325 MG tablet, Take 325 mg by mouth daily., Disp: , Rfl:  .  Ferrous Sulfate (IRON PO), Take by mouth., Disp: , Rfl:  .  ibuprofen (ADVIL,MOTRIN) 200 MG tablet, Take 400 mg by mouth 2 (two) times daily as needed for headache or moderate pain., Disp: , Rfl:  .  latanoprost (XALATAN) 0.005 % ophthalmic solution, 1 drop at bedtime., Disp: , Rfl:  .  levothyroxine (SYNTHROID) 112 MCG tablet, Take 1 tablet (112 mcg total) by mouth daily., Disp: 90 tablet, Rfl: 1 .  lisinopril (ZESTRIL) 5 MG tablet, Take 1 tablet (5 mg total) by mouth daily., Disp: 90 tablet, Rfl: 1 .  Magnesium 400 MG CAPS, Take 400 mg by mouth at bedtime., Disp: 90 capsule, Rfl: 1 .  metFORMIN (GLUCOPHAGE-XR) 500 MG 24 hr tablet, Take 1 tablet (500 mg total) by mouth daily with breakfast., Disp: 90 tablet, Rfl: 1 .  Multiple Vitamins-Minerals (MULTIVITAMIN/EXTRA VITAMIN D3) CHEW, Chew by mouth., Disp: , Rfl:  .  omeprazole (PRILOSEC) 20 MG capsule, Take 20 mg by mouth daily. prn, Disp: , Rfl:  .  ONETOUCH VERIO test strip, USE STRIP TO CHECK BLOOD SUGAR UP TO 4 TIMES PER DAY, Disp: 200 each, Rfl: 11 .  rosuvastatin (CRESTOR) 10 MG tablet, Take 1 tablet (10 mg total) by mouth at bedtime., Disp: 90 tablet, Rfl: 1 .  tadalafil (CIALIS) 5 MG tablet, Take 5 mg by mouth daily., Disp: , Rfl: 11 .  timolol  (TIMOPTIC) 0.5 % ophthalmic solution, INSTILL 1 DROP INTO EACH EYE ONCE DAILY IN THE MORNING, Disp: , Rfl: 2 .  Turmeric 500 MG CAPS, Take by mouth., Disp: , Rfl:  .  vitamin C (ASCORBIC ACID) 500 MG tablet, Take 1,000 mg by mouth daily., Disp: , Rfl:  .  methocarbamol (ROBAXIN) 750 MG tablet, Take 750 mg by mouth 4 (four) times daily. As needed, Disp: , Rfl:  .  pseudoephedrine (SUDAFED) 30 MG tablet, Take 60 mg by mouth 2 (two) times daily as needed for congestion., Disp: , Rfl:  .  testosterone enanthate (DELATESTRYL) 200 MG/ML injection, Inject 200 mg into the muscle every 30 (thirty) days. For IM use only, Disp: , Rfl:  .  Vitamin D, Ergocalciferol, (DRISDOL) 1.25 MG (50000 UNIT) CAPS capsule, Take 1 capsule (50,000 Units total) by mouth every 7 (seven) days. Take one capsule po twice weekly on Tues/Fridays, Disp: 24 capsule, Rfl: 0   Allergies  Allergen Reactions  . Horseradish [Armoracia Rusticana Ext (Horseradish)]   . Lipitor [Atorvastatin] Other (See Comments)    Leg cramps  . Metformin And Related Other (See Comments)    GI upset  . Rye Grass Flower Pollen Extract [Gramineae Pollens]   . Soy Allergy      Review of Systems  Constitutional: Negative.   Eyes: Negative for blurred vision.  Respiratory: Negative.   Cardiovascular: Negative.  Negative for chest pain.  Gastrointestinal: Negative.   Musculoskeletal: Negative for myalgias.  Neurological: Negative.   Psychiatric/Behavioral: Negative.      Today's Vitals   10/07/19 1501  BP: (!) 144/96  Pulse: 78  Temp: 97.6 F (36.4 C)  TempSrc: Oral  Weight: (!) 301 lb 6.4 oz (136.7 kg)  Height: 6' 1" (1.854 m)  PainSc: 0-No pain   Body mass index is 39.76 kg/m.   Objective:  Physical Exam Vitals and nursing note reviewed.  Constitutional:      Appearance: Normal appearance. He is obese.  Cardiovascular:     Rate and Rhythm: Normal rate and regular rhythm.     Heart sounds: Normal heart sounds.  Pulmonary:      Effort: Pulmonary effort is normal.     Breath sounds: Normal breath sounds.  Skin:    General: Skin is warm.  Neurological:     General: No focal deficit present.     Mental Status: He is alert.  Psychiatric:        Mood and Affect: Mood normal.         Assessment And Plan:     1. Pure hypercholesterolemia  Previous LDL from August 2020 reviewed, , 117. Pt advised that goal is less than 70.  He wishes to first work on lifestyle changes prior to increasing dose of rosuvastatin. He is encouraged to exercise at least 30 minutes five days per week, avoid fried foods and sugary beverages , and to increase his fiber intake. I will recheck at a later visit.   2. Type 2 diabetes mellitus with hyperglycemia, without long-term current use of insulin ([place])  I will check labs as listed below. Again, importance of dietary compliance was discussed. I will adjust meds as needed after review of labwork. Additionally, he brought in his meter, BS log was reviewed during his visit.   - BMP8+EGFR - Hemoglobin A1c  3. Essential hypertension, benign  Chronic, fair control. Optimal blood pressure is less than 130/80. Again, importance of regular exercise was discussed with the patient.   4. Hypogonadism male  Chronic. Previous testosterone results reviewed. He is encouraged to f/u with Urology for possible dose adjustment of testosterone injections.   5. Primary hypothyroidism  I will check thyroid panel and adjust meds as needed.  - TSH  6. Vitamin D deficiency disease  I WILL CHECK A VIT D LEVEL AND SUPPLEMENT AS NEEDED.  ALSO ENCOURAGED TO SPEND 15 MINUTES IN THE SUN DAILY.  - Vitamin D (25 hydroxy)  7. Class 2 severe obesity due to excess calories with serious comorbidity and body mass index (BMI) of 39.0 to 39.9 in adult [facility])  He is encouraged to strive for BMI less than 30 to decrease cardiac risk. He is encouraged to strive to lose ten pounds prior to his next visit.   Maximino Greenland, MD    THE PATIENT IS ENCOURAGED TO PRACTICE SOCIAL DISTANCING DUE TO THE COVID-19 PANDEMIC.

## 2019-10-12 ENCOUNTER — Other Ambulatory Visit: Payer: Self-pay

## 2019-10-12 ENCOUNTER — Telehealth: Payer: Self-pay

## 2019-10-12 MED ORDER — VITAMIN D (ERGOCALCIFEROL) 1.25 MG (50000 UNIT) PO CAPS: ORAL_CAPSULE | ORAL | 0 refills | Status: DC

## 2019-10-12 NOTE — Telephone Encounter (Signed)
Left a detailed message at the pt's request. 

## 2019-10-12 NOTE — Telephone Encounter (Signed)
-----   Message from Glendale Chard, MD sent at 10/10/2019 10:47 AM EDT ----- Please confirm he is taking rosuvastatin daily. Update chart if needed. Thx

## 2019-10-15 ENCOUNTER — Telehealth: Payer: Self-pay

## 2019-10-15 NOTE — Telephone Encounter (Signed)
The pt's wife Mrs Dodgson said that the pt said no that he isn't taking the crestor and that Mrs. Masten said that she was going to get a refill that the pt had at the pharmacy.  Mrs Clarkin was asked why the pt wasn't taking the med was it causing him any problems and she said no the medication wasn't causing the pt any problems.

## 2019-10-19 ENCOUNTER — Other Ambulatory Visit: Payer: Self-pay

## 2019-10-19 MED ORDER — VITAMIN D (ERGOCALCIFEROL) 1.25 MG (50000 UNIT) PO CAPS: ORAL_CAPSULE | ORAL | 0 refills | Status: DC

## 2019-10-22 ENCOUNTER — Ambulatory Visit: Payer: Medicare Other | Admitting: Vascular Surgery

## 2019-11-17 ENCOUNTER — Telehealth: Payer: Self-pay | Admitting: Internal Medicine

## 2019-11-17 NOTE — Chronic Care Management (AMB) (Signed)
  Chronic Care Management   Note  11/17/2019 Name: Connor Becker. MRN: [id number] DOB: 07-16-1951  Connor Becker. is a 69 y.o. year old male who is a primary care patient of Glendale Chard, MD. I reached out to [name]. by phone today in response to a referral sent by [name]'s health plan.     Connor Becker was given information about Chronic Care Management services today including:  1. CCM service includes personalized support from designated clinical staff supervised by his physician, including individualized plan of care and coordination with other care providers 2. 24/7 contact phone numbers for assistance for urgent and routine care needs. 3. Service will only be billed when office clinical staff spend 20 minutes or more in a month to coordinate care. 4. Only one practitioner may furnish and bill the service in a calendar month. 5. The patient may stop CCM services at any time (effective at the end of the month) by phone call to the office staff. 6. The patient will be responsible for cost sharing (co-pay) of up to 20% of the service fee (after annual deductible is met).  Patient did not agree to services and wishes to consider information provided before deciding about enrollment in care management services.   Follow up plan: The care management team is available to follow up with the patient after provider conversation with the patient regarding recommendation for care management engagement and subsequent re-referral to the care management team.   [place], [place], [place] [address] Direct Dial: [email or web address] Website: [email or web address]

## 2019-11-19 ENCOUNTER — Ambulatory Visit: Payer: Medicare Other | Admitting: Vascular Surgery

## 2019-12-15 ENCOUNTER — Other Ambulatory Visit: Payer: Self-pay | Admitting: Internal Medicine

## 2019-12-15 DIAGNOSIS — E039 Hypothyroidism, unspecified: Secondary | ICD-10-CM

## 2019-12-16 DIAGNOSIS — R350 Frequency of micturition: Secondary | ICD-10-CM | POA: Diagnosis not present

## 2019-12-17 ENCOUNTER — Ambulatory Visit: Payer: Medicare Other | Admitting: Vascular Surgery

## 2020-01-06 ENCOUNTER — Other Ambulatory Visit: Payer: Self-pay

## 2020-01-06 ENCOUNTER — Encounter: Payer: Self-pay | Admitting: Internal Medicine

## 2020-01-06 ENCOUNTER — Ambulatory Visit (INDEPENDENT_AMBULATORY_CARE_PROVIDER_SITE_OTHER): Payer: Medicare Other | Admitting: Internal Medicine

## 2020-01-06 VITALS — BP 116/78 | HR 75 | Temp 98.1°F | Ht 73.0 in | Wt 315.0 lb

## 2020-01-06 DIAGNOSIS — Z6841 Body Mass Index (BMI) 40.0 and over, adult: Secondary | ICD-10-CM | POA: Diagnosis not present

## 2020-01-06 DIAGNOSIS — E1165 Type 2 diabetes mellitus with hyperglycemia: Secondary | ICD-10-CM | POA: Diagnosis not present

## 2020-01-06 DIAGNOSIS — E785 Hyperlipidemia, unspecified: Secondary | ICD-10-CM | POA: Diagnosis not present

## 2020-01-06 DIAGNOSIS — E782 Mixed hyperlipidemia: Secondary | ICD-10-CM

## 2020-01-06 DIAGNOSIS — H47233 Glaucomatous optic atrophy, bilateral: Secondary | ICD-10-CM | POA: Diagnosis not present

## 2020-01-06 DIAGNOSIS — H401133 Primary open-angle glaucoma, bilateral, severe stage: Secondary | ICD-10-CM | POA: Diagnosis not present

## 2020-01-06 LAB — HM DIABETES EYE EXAM

## 2020-01-07 ENCOUNTER — Ambulatory Visit: Payer: Medicare Other | Admitting: Vascular Surgery

## 2020-01-07 ENCOUNTER — Encounter: Payer: Self-pay | Admitting: Internal Medicine

## 2020-01-07 LAB — CMP14+EGFR
ALT: 22 IU/L (ref 0–44)
AST: 21 IU/L (ref 0–40)
Albumin/Globulin Ratio: 1.5 (ref 1.2–2.2)
Albumin: 4 g/dL (ref 3.8–4.8)
Alkaline Phosphatase: 74 IU/L (ref 48–121)
BUN/Creatinine Ratio: 10 (ref 10–24)
BUN: 11 mg/dL (ref 8–27)
Bilirubin Total: 0.4 mg/dL (ref 0.0–1.2)
CO2: 27 mmol/L (ref 20–29)
Calcium: 9.2 mg/dL (ref 8.6–10.2)
Chloride: 101 mmol/L (ref 96–106)
Creatinine, Ser: 1.07 mg/dL (ref 0.76–1.27)
GFR calc Af Amer: 81 mL/min/{1.73_m2} (ref >59)
GFR calc non Af Amer: 70 mL/min/{1.73_m2} (ref >59)
Globulin, Total: 2.6 g/dL (ref 1.5–4.5)
Glucose: 145 mg/dL — ABNORMAL HIGH (ref 65–99)
Potassium: 4.6 mmol/L (ref 3.5–5.2)
Sodium: 138 mmol/L (ref 134–144)
Total Protein: 6.6 g/dL (ref 6.0–8.5)

## 2020-01-07 LAB — HEMOGLOBIN A1C
Est. average glucose Bld gHb Est-mCnc: 163 mg/dL
Hgb A1c MFr Bld: 7.3 % — ABNORMAL HIGH (ref 4.8–5.6)

## 2020-01-09 NOTE — Progress Notes (Signed)
This visit occurred during the SARS-CoV-2 public health emergency.  Safety protocols were in place, including screening questions prior to the visit, additional usage of staff PPE, and extensive cleaning of exam room while observing appropriate contact time as indicated for disinfecting solutions.  Subjective:     Patient ID: Connor Becker , male    DOB: 09/06/1950 , 69 y.o.   MRN: [id number]   Chief Complaint  Patient presents with  . Diabetes  . Hyperlipidemia    HPI  Diabetes He presents for his follow-up diabetic visit. He has type 2 diabetes mellitus. There are no hypoglycemic associated symptoms. There are no hypoglycemic complications. There are no diabetic complications. Risk factors for coronary artery disease include diabetes mellitus, dyslipidemia, male sex, obesity and sedentary lifestyle. He participates in exercise intermittently. An ACE inhibitor/angiotensin II receptor blocker is being taken. Eye exam is not current.  Hyperlipidemia This is a chronic problem. The current episode started more than 1 year ago. Exacerbating diseases include diabetes and obesity. Pertinent negatives include no myalgias or shortness of breath. Current antihyperlipidemic treatment includes statins. The current treatment provides moderate improvement of lipids. Compliance problems include adherence to exercise.  Risk factors for coronary artery disease include diabetes mellitus, dyslipidemia, male sex, hypertension, a sedentary lifestyle and obesity.     Past Medical History:  Diagnosis Date  . ALLERGIC RHINITIS   . Chronic pain of both knees   . DJD (degenerative joint disease) of knee    right  . DM2 (diabetes mellitus, type 2) ([place])   . GERD   . Glaucoma   . History of nuclear stress test    Myoview 12/17: EF 53, no ischemia, Low Risk  . HYPERLIPIDEMIA    myalgias from Lipitor  . HYPOGONADISM, MALE   . Hypothyroidism   . INSOMNIA-SLEEP DISORDER-UNSPEC   . Joint pain   .  Obesity   . OSTEOARTHRITIS   . SLEEP APNEA, OBSTRUCTIVE    no CPAP; repeat sleep test pending 07/2016  . Unspecified hypothyroidism 12/04/2013  . Vitamin D deficiency      Family History  Problem Relation Age of Onset  . Diabetes Maternal Uncle   . Heart attack Maternal Uncle   . Hypertension Other   . Prostate cancer Other   . Heart disease Other   . Heart attack Mother 6       s/p MI  . Other Mother        pacemaker  . Diabetes Mother   . High blood pressure Mother   . High Cholesterol Mother   . Thyroid disease Mother   . Obesity Mother   . Macular degeneration Father   . Thyroid disease Father   . Colon cancer Neg Hx      Current Outpatient Medications:  .  aspirin 325 MG tablet, Take 325 mg by mouth daily., Disp: , Rfl:  .  Ferrous Sulfate (IRON PO), Take by mouth., Disp: , Rfl:  .  ibuprofen (ADVIL,MOTRIN) 200 MG tablet, Take 400 mg by mouth 2 (two) times daily as needed for headache or moderate pain., Disp: , Rfl:  .  latanoprost (XALATAN) 0.005 % ophthalmic solution, 1 drop at bedtime., Disp: , Rfl:  .  levothyroxine (SYNTHROID) 112 MCG tablet, Take 1 tablet by mouth once daily, Disp: 90 tablet, Rfl: 0 .  lisinopril (ZESTRIL) 5 MG tablet, Take 1 tablet (5 mg total) by mouth daily., Disp: 90 tablet, Rfl: 1 .  Magnesium 400 MG CAPS, Take 400 mg  by mouth at bedtime., Disp: 90 capsule, Rfl: 1 .  metFORMIN (GLUCOPHAGE-XR) 500 MG 24 hr tablet, Take 1 tablet (500 mg total) by mouth daily with breakfast., Disp: 90 tablet, Rfl: 1 .  methocarbamol (ROBAXIN) 750 MG tablet, Take 750 mg by mouth 4 (four) times daily. As needed, Disp: , Rfl:  .  Multiple Vitamins-Minerals (MULTIVITAMIN/EXTRA VITAMIN D3) CHEW, Chew by mouth., Disp: , Rfl:  .  omeprazole (PRILOSEC) 20 MG capsule, Take 20 mg by mouth daily. prn, Disp: , Rfl:  .  ONETOUCH VERIO test strip, USE STRIP TO CHECK BLOOD SUGAR UP TO 4 TIMES PER DAY, Disp: 200 each, Rfl: 11 .  pseudoephedrine (SUDAFED) 30 MG tablet, Take 60  mg by mouth 2 (two) times daily as needed for congestion., Disp: , Rfl:  .  rosuvastatin (CRESTOR) 10 MG tablet, Take 1 tablet (10 mg total) by mouth at bedtime., Disp: 90 tablet, Rfl: 1 .  tadalafil (CIALIS) 5 MG tablet, Take 5 mg by mouth daily., Disp: , Rfl: 11 .  testosterone enanthate (DELATESTRYL) 200 MG/ML injection, Inject 200 mg into the muscle every 30 (thirty) days. For IM use only, Disp: , Rfl:  .  timolol (TIMOPTIC) 0.5 % ophthalmic solution, INSTILL 1 DROP INTO EACH EYE ONCE DAILY IN THE MORNING, Disp: , Rfl: 2 .  Turmeric 500 MG CAPS, Take by mouth., Disp: , Rfl:  .  vitamin C (ASCORBIC ACID) 500 MG tablet, Take 1,000 mg by mouth daily., Disp: , Rfl:  .  Vitamin D, Ergocalciferol, (DRISDOL) 1.25 MG (50000 UNIT) CAPS capsule, Take one capsule by mouth on Tues/Fridays, Disp: 24 capsule, Rfl: 0   Allergies  Allergen Reactions  . Horseradish [Armoracia Rusticana Ext (Horseradish)]   . Lipitor [Atorvastatin] Other (See Comments)    Leg cramps  . Metformin And Related Other (See Comments)    GI upset  . Rye Grass Flower Pollen Extract [Gramineae Pollens]   . Soy Allergy      Review of Systems  Constitutional: Negative.   Respiratory: Negative.  Negative for shortness of breath.   Cardiovascular: Negative.   Gastrointestinal: Negative.   Musculoskeletal: Negative for myalgias.  Neurological: Negative.   Psychiatric/Behavioral: Negative.      Today's Vitals   01/06/20 1457  BP: 116/78  Pulse: 75  Temp: 98.1 F (36.7 C)  TempSrc: Oral  Weight: (!) 315 lb (142.9 kg)  Height: 6' 1" (1.854 m)  PainSc: 0-No pain   Body mass index is 41.56 kg/m.   Objective:  Physical Exam Vitals and nursing note reviewed.  Constitutional:      Appearance: Normal appearance. He is obese.  Cardiovascular:     Rate and Rhythm: Normal rate and regular rhythm.     Heart sounds: Normal heart sounds.  Pulmonary:     Effort: Pulmonary effort is normal.     Breath sounds: Normal breath  sounds.  Skin:    General: Skin is warm.  Neurological:     General: No focal deficit present.     Mental Status: He is alert.  Psychiatric:        Mood and Affect: Mood normal.         Assessment And Plan:     1. Type 2 diabetes mellitus with hyperglycemia, without long-term current use of insulin (HCC)  Chronic, I will check labs as listed below. I will adjust meds as needed.   - Hemoglobin A1c - CMP14+EGFR  2. Mixed hyperlipidemia  Chronic, last LDL 117 August 2020.  I will check non-fasting lipid panel today. Encouraged to avoid fried foods, exercise and increase fiber intake.   3. Class 3 severe obesity due to excess calories with serious comorbidity and body mass index (BMI) of 40.0 to 44.9 in adult (HCC)  BMI 41. He is encouraged to strive for BMI less than 35 to decrease cardiac risk. He is encouraged to incorporate more exercise into his daily routine. He is advised to aim for at least 150 minutes of exercise per week.   Maximino Greenland, MD    THE PATIENT IS ENCOURAGED TO PRACTICE SOCIAL DISTANCING DUE TO THE COVID-19 PANDEMIC.

## 2020-01-11 LAB — LIPID PANEL W/O CHOL/HDL RATIO
Cholesterol, Total: 196 mg/dL (ref 100–199)
HDL: 35 mg/dL — ABNORMAL LOW (ref >39)
LDL Chol Calc (NIH): 113 mg/dL — ABNORMAL HIGH (ref 0–99)
Triglycerides: 276 mg/dL — ABNORMAL HIGH (ref 0–149)
VLDL Cholesterol Cal: 48 mg/dL — ABNORMAL HIGH (ref 5–40)

## 2020-01-11 LAB — SPECIMEN STATUS REPORT

## 2020-01-15 ENCOUNTER — Telehealth: Payer: Self-pay

## 2020-01-15 NOTE — Telephone Encounter (Signed)
-----   Message from Glendale Chard, MD sent at 01/11/2020  8:18 PM EDT ----- LDL not at goal. Need to increase rosuvastatin to 20mg  daily. He may take TWO of 10mg  tablets until he runs out. Will need f/u chol check in six weeks. Please read him results of other labs, I mistakenly sent in [place] and he prefers phone calls.

## 2020-01-15 NOTE — Telephone Encounter (Signed)
Attempt to give lab results  

## 2020-01-19 ENCOUNTER — Telehealth: Payer: Self-pay

## 2020-01-19 NOTE — Telephone Encounter (Signed)
-----   Message from Glendale Chard, MD sent at 01/11/2020  8:18 PM EDT ----- LDL not at goal. Need to increase rosuvastatin to 20mg  daily. He may take TWO of 10mg  tablets until he runs out. Will need f/u chol check in six weeks. Please read him results of other labs, I mistakenly sent in [place] and he prefers phone calls.

## 2020-01-19 NOTE — Telephone Encounter (Signed)
Attempt to discuss medication changes and lab results

## 2020-02-17 ENCOUNTER — Other Ambulatory Visit: Payer: Self-pay

## 2020-02-17 DIAGNOSIS — R42 Dizziness and giddiness: Secondary | ICD-10-CM

## 2020-02-18 DIAGNOSIS — H8112 Benign paroxysmal vertigo, left ear: Secondary | ICD-10-CM | POA: Diagnosis not present

## 2020-03-02 ENCOUNTER — Other Ambulatory Visit: Payer: Self-pay

## 2020-03-02 ENCOUNTER — Encounter: Payer: Self-pay | Admitting: Internal Medicine

## 2020-03-02 DIAGNOSIS — E1165 Type 2 diabetes mellitus with hyperglycemia: Secondary | ICD-10-CM

## 2020-03-02 MED ORDER — DAPAGLIFLOZIN PROPANEDIOL 10 MG PO TABS: 10.0000 mg | ORAL_TABLET | Freq: Every day | ORAL | 1 refills | Status: DC

## 2020-03-03 ENCOUNTER — Ambulatory Visit: Payer: Medicare Other | Admitting: Vascular Surgery

## 2020-03-31 ENCOUNTER — Encounter: Payer: Self-pay | Admitting: Internal Medicine

## 2020-03-31 ENCOUNTER — Other Ambulatory Visit: Payer: Self-pay

## 2020-03-31 ENCOUNTER — Ambulatory Visit: Payer: Medicare Other

## 2020-03-31 ENCOUNTER — Ambulatory Visit (INDEPENDENT_AMBULATORY_CARE_PROVIDER_SITE_OTHER): Payer: Medicare Other | Admitting: Internal Medicine

## 2020-03-31 VITALS — BP 134/82 | HR 80 | Temp 97.8°F | Ht 68.8 in | Wt 304.6 lb

## 2020-03-31 DIAGNOSIS — R300 Dysuria: Secondary | ICD-10-CM

## 2020-03-31 DIAGNOSIS — E1165 Type 2 diabetes mellitus with hyperglycemia: Secondary | ICD-10-CM

## 2020-03-31 DIAGNOSIS — E039 Hypothyroidism, unspecified: Secondary | ICD-10-CM | POA: Diagnosis not present

## 2020-03-31 DIAGNOSIS — Z6841 Body Mass Index (BMI) 40.0 and over, adult: Secondary | ICD-10-CM

## 2020-03-31 DIAGNOSIS — Z23 Encounter for immunization: Secondary | ICD-10-CM | POA: Diagnosis not present

## 2020-03-31 DIAGNOSIS — Z2821 Immunization not carried out because of patient refusal: Secondary | ICD-10-CM

## 2020-03-31 LAB — POCT URINALYSIS DIPSTICK
Bilirubin, UA: NEGATIVE
Blood, UA: NEGATIVE
Glucose, UA: NEGATIVE
Ketones, UA: NEGATIVE
Leukocytes, UA: NEGATIVE
Nitrite, UA: NEGATIVE
Protein, UA: NEGATIVE
Spec Grav, UA: 1.02 (ref 1.010–1.025)
Urobilinogen, UA: 0.2 E.U./dL
pH, UA: 7 (ref 5.0–8.0)

## 2020-03-31 LAB — HM DIABETES EYE EXAM

## 2020-03-31 NOTE — Patient Instructions (Signed)
Diabetes Mellitus and Exercise Exercising regularly is important for your overall health, especially when you have diabetes (diabetes mellitus). Exercising is not only about losing weight. It has many other health benefits, such as increasing muscle strength and bone density and reducing body fat and stress. This leads to improved fitness, flexibility, and endurance, all of which result in better overall health. Exercise has additional benefits for people with diabetes, including:  Reducing appetite.  Helping to lower and control blood glucose.  Lowering blood pressure.  Helping to control amounts of fatty substances (lipids) in the blood, such as cholesterol and triglycerides.  Helping the body to respond better to insulin (improving insulin sensitivity).  Reducing how much insulin the body needs.  Decreasing the risk for heart disease by: ? Lowering cholesterol and triglyceride levels. ? Increasing the levels of good cholesterol. ? Lowering blood glucose levels. What is my activity plan? Your health care provider or certified diabetes educator can help you make a plan for the type and frequency of exercise (activity plan) that works for you. Make sure that you:  Do at least 150 minutes of moderate-intensity or vigorous-intensity exercise each week. This could be brisk walking, biking, or water aerobics. ? Do stretching and strength exercises, such as yoga or weightlifting, at least 2 times a week. ? Spread out your activity over at least 3 days of the week.  Get some form of physical activity every day. ? Do not go more than 2 days in a row without some kind of physical activity. ? Avoid being inactive for more than 30 minutes at a time. Take frequent breaks to walk or stretch.  Choose a type of exercise or activity that you enjoy, and set realistic goals.  Start slowly, and gradually increase the intensity of your exercise over time. What do I need to know about managing my  diabetes?   Check your blood glucose before and after exercising. ? If your blood glucose is 240 mg/dL (13.3 mmol/L) or higher before you exercise, check your urine for ketones. If you have ketones in your urine, do not exercise until your blood glucose returns to normal. ? If your blood glucose is 100 mg/dL (5.6 mmol/L) or lower, eat a snack containing 15-20 grams of carbohydrate. Check your blood glucose 15 minutes after the snack to make sure that your level is above 100 mg/dL (5.6 mmol/L) before you start your exercise.  Know the symptoms of low blood glucose (hypoglycemia) and how to treat it. Your risk for hypoglycemia increases during and after exercise. Common symptoms of hypoglycemia can include: ? Hunger. ? Anxiety. ? Sweating and feeling clammy. ? Confusion. ? Dizziness or feeling light-headed. ? Increased heart rate or palpitations. ? Blurry vision. ? Tingling or numbness around the mouth, lips, or tongue. ? Tremors or shakes. ? Irritability.  Keep a rapid-acting carbohydrate snack available before, during, and after exercise to help prevent or treat hypoglycemia.  Avoid injecting insulin into areas of the body that are going to be exercised. For example, avoid injecting insulin into: ? The arms, when playing tennis. ? The legs, when jogging.  Keep records of your exercise habits. Doing this can help you and your health care provider adjust your diabetes management plan as needed. Write down: ? Food that you eat before and after you exercise. ? Blood glucose levels before and after you exercise. ? The type and amount of exercise you have done. ? When your insulin is expected to peak, if you use   insulin. Avoid exercising at times when your insulin is peaking.  When you start a new exercise or activity, work with your health care provider to make sure the activity is safe for you, and to adjust your insulin, medicines, or food intake as needed.  Drink plenty of water while  you exercise to prevent dehydration or heat stroke. Drink enough fluid to keep your urine clear or pale yellow. Summary  Exercising regularly is important for your overall health, especially when you have diabetes (diabetes mellitus).  Exercising has many health benefits, such as increasing muscle strength and bone density and reducing body fat and stress.  Your health care provider or certified diabetes educator can help you make a plan for the type and frequency of exercise (activity plan) that works for you.  When you start a new exercise or activity, work with your health care provider to make sure the activity is safe for you, and to adjust your insulin, medicines, or food intake as needed. This information is not intended to replace advice given to you by your health care provider. Make sure you discuss any questions you have with your health care provider. Document Revised: 01/24/2017 Document Reviewed: 12/12/2015 Elsevier Patient Education  2020 Elsevier Inc.  

## 2020-03-31 NOTE — Progress Notes (Signed)
He is encouraged to initially strive for BMI less than 30 to decrease cardiac risk. He is advised to exercise no less than 150 minutes per week.   This visit occurred during the SARS-CoV-2 public health emergency.  Safety protocols were in place, including screening questions prior to the visit, additional usage of staff PPE, and extensive cleaning of exam room while observing appropriate contact time as indicated for disinfecting solutions.  Subjective:     Patient ID: Connor Becker , male    DOB: [birth year] , 69 y.o.   MRN: [id number]   Chief Complaint  Patient presents with  . Diabetes    HPI  Patient presents today for diabetes check. Patient admits that he is not compliant with medication for 3 weeks. He states that diabetes medication made him have urinary symptoms and diarrhea. Patient states that he is still having lots of urinary symptoms at this time.  Patient is also refusing COVID19 and influenza vaccines at this time.   Diabetes He presents for his follow-up diabetic visit. He has type 2 diabetes mellitus. There are no hypoglycemic associated symptoms. There are no hypoglycemic complications. There are no diabetic complications. Risk factors for coronary artery disease include diabetes mellitus, dyslipidemia, male sex, obesity and sedentary lifestyle. He participates in exercise intermittently. An ACE inhibitor/angiotensin II receptor blocker is being taken. Eye exam is not current.     Past Medical History:  Diagnosis Date  . ALLERGIC RHINITIS   . Chronic pain of both knees   . DJD (degenerative joint disease) of knee    right  . DM2 (diabetes mellitus, type 2) ([place])   . GERD   . Glaucoma   . History of nuclear stress test    Myoview 12/17: EF 53, no ischemia, Low Risk  . HYPERLIPIDEMIA    myalgias from Lipitor  . HYPOGONADISM, MALE   . Hypothyroidism   . INSOMNIA-SLEEP DISORDER-UNSPEC   . Joint pain   . Obesity   . OSTEOARTHRITIS   . SLEEP APNEA,  OBSTRUCTIVE    no CPAP; repeat sleep test pending 07/2016  . Unspecified hypothyroidism 12/04/2013  . Vitamin D deficiency      Family History  Problem Relation Age of Onset  . Diabetes Maternal Uncle   . Heart attack Maternal Uncle   . Hypertension Other   . Prostate cancer Other   . Heart disease Other   . Heart attack Mother 31       s/p MI  . Other Mother        pacemaker  . Diabetes Mother   . High blood pressure Mother   . High Cholesterol Mother   . Thyroid disease Mother   . Obesity Mother   . Macular degeneration Father   . Thyroid disease Father   . Colon cancer Neg Hx      Current Outpatient Medications:  .  aspirin 325 MG tablet, Take 325 mg by mouth daily., Disp: , Rfl:  .  Ferrous Sulfate (IRON PO), Take by mouth., Disp: , Rfl:  .  ibuprofen (ADVIL,MOTRIN) 200 MG tablet, Take 400 mg by mouth 2 (two) times daily as needed for headache or moderate pain., Disp: , Rfl:  .  latanoprost (XALATAN) 0.005 % ophthalmic solution, 1 drop at bedtime., Disp: , Rfl:  .  lisinopril (ZESTRIL) 5 MG tablet, Take 1 tablet (5 mg total) by mouth daily., Disp: 90 tablet, Rfl: 1 .  Magnesium 400 MG CAPS, Take 400 mg by mouth at bedtime.,  Disp: 90 capsule, Rfl: 1 .  methocarbamol (ROBAXIN) 750 MG tablet, Take 750 mg by mouth 4 (four) times daily. As needed, Disp: , Rfl:  .  Multiple Vitamins-Minerals (MULTIVITAMIN/EXTRA VITAMIN D3) CHEW, Chew by mouth., Disp: , Rfl:  .  omeprazole (PRILOSEC) 20 MG capsule, Take 20 mg by mouth daily. prn, Disp: , Rfl:  .  ONETOUCH VERIO test strip, USE STRIP TO CHECK BLOOD SUGAR UP TO 4 TIMES PER DAY, Disp: 200 each, Rfl: 11 .  pseudoephedrine (SUDAFED) 30 MG tablet, Take 60 mg by mouth 2 (two) times daily as needed for congestion., Disp: , Rfl:  .  rosuvastatin (CRESTOR) 10 MG tablet, Take 1 tablet (10 mg total) by mouth at bedtime., Disp: 90 tablet, Rfl: 1 .  tadalafil (CIALIS) 5 MG tablet, Take 5 mg by mouth daily., Disp: , Rfl: 11 .  testosterone  enanthate (DELATESTRYL) 200 MG/ML injection, Inject 200 mg into the muscle every 30 (thirty) days. For IM use only, Disp: , Rfl:  .  timolol (TIMOPTIC) 0.5 % ophthalmic solution, INSTILL 1 DROP INTO EACH EYE ONCE DAILY IN THE MORNING, Disp: , Rfl: 2 .  Turmeric 500 MG CAPS, Take by mouth., Disp: , Rfl:  .  vitamin C (ASCORBIC ACID) 500 MG tablet, Take 1,000 mg by mouth daily., Disp: , Rfl:  .  Vitamin D, Ergocalciferol, (DRISDOL) 1.25 MG (50000 UNIT) CAPS capsule, Take one capsule by mouth on Tues/Fridays, Disp: 24 capsule, Rfl: 0 .  levothyroxine (SYNTHROID) 112 MCG tablet, Take 1 tablet by mouth once daily, Disp: 90 tablet, Rfl: 0   Allergies  Allergen Reactions  . Horseradish [Armoracia Rusticana Ext (Horseradish)]   . Lipitor [Atorvastatin] Other (See Comments)    Leg cramps  . Metformin And Related Other (See Comments)    GI upset  . Rye Grass Flower Pollen Extract [Gramineae Pollens]   . Soy Allergy      Review of Systems  Constitutional: Negative.   Respiratory: Negative.   Cardiovascular: Negative.   Gastrointestinal: Negative.   Genitourinary: Positive for dysuria.  Neurological: Negative.      Today's Vitals   03/31/20 0929  BP: 134/82  Pulse: 80  Temp: 97.8 F (36.6 C)  TempSrc: Oral  Weight: (!) 304 lb 9.6 oz (138.2 kg)  Height: 5' 8.8" (1.748 m)   Body mass index is 45.24 kg/m.   Wt Readings from Last 3 Encounters:  03/31/20 (!) 304 lb 9.6 oz (138.2 kg)  01/06/20 (!) 315 lb (142.9 kg)  10/07/19 (!) 301 lb (136.5 kg)    Objective:  Physical Exam Vitals and nursing note reviewed.  Constitutional:      Appearance: Normal appearance. He is obese.  Cardiovascular:     Rate and Rhythm: Normal rate and regular rhythm.     Heart sounds: Normal heart sounds.  Pulmonary:     Effort: Pulmonary effort is normal.     Breath sounds: Normal breath sounds.  Skin:    General: Skin is warm.  Neurological:     General: No focal deficit present.     Mental  Status: He is alert.  Psychiatric:        Mood and Affect: Mood normal.         Assessment And Plan:     1. Type 2 diabetes mellitus with hyperglycemia, without long-term current use of insulin ([facility]) Comments: He is off all meds. He agrees to Endocrinology referral to discuss other options. Encouraged to avoid sugary drinks like Gatorade, sodas  and diet drinks.  - Hemoglobin A1c - BMP8+EGFR - Ambulatory referral to Endocrinology  2. Primary hypothyroidism Comments: I will check thyroid panel and adjust meds as needed.  - TSH - T4, Free - Ambulatory referral to Endocrinology  3. Dysuria Comments: U/a  negative. He is also followed by Urology. He is encouraged to f/u with Urology regarding his symptoms. His sx could be due to interstitial cystitis since his sx have persisted despite cessation of Farxiga.  - POCT Urinalysis Dipstick (81002)  4. Class 3 severe obesity due to excess calories with serious comorbidity and body mass index (BMI) of 40.0 to 44.9 in adult [facility]) He was congratulated on his 11 pound weight loss.  Encouraged to aim for at least 150 minutes of exercise per week.   5. Immunization due Comments: He declines flu, pneumonia and COVID vaccines.    6. COVID-19 virus vaccination declined      Patient was given opportunity to ask questions. Patient verbalized understanding of the plan and was able to repeat key elements of the plan. All questions were answered to their satisfaction.  Maximino Greenland, MD   I, Maximino Greenland, MD, have reviewed all documentation for this visit. The documentation on 04/10/20 for the exam, diagnosis, procedures, and orders are all accurate and complete.  THE PATIENT IS ENCOURAGED TO PRACTICE SOCIAL DISTANCING DUE TO THE COVID-19 PANDEMIC.

## 2020-04-01 ENCOUNTER — Other Ambulatory Visit: Payer: Self-pay | Admitting: Internal Medicine

## 2020-04-01 DIAGNOSIS — E039 Hypothyroidism, unspecified: Secondary | ICD-10-CM

## 2020-04-01 LAB — T4, FREE: Free T4: 1.6 ng/dL (ref 0.82–1.77)

## 2020-04-01 LAB — BMP8+EGFR
BUN/Creatinine Ratio: 15 (ref 10–24)
BUN: 16 mg/dL (ref 8–27)
CO2: 24 mmol/L (ref 20–29)
Calcium: 9.3 mg/dL (ref 8.6–10.2)
Chloride: 99 mmol/L (ref 96–106)
Creatinine, Ser: 1.06 mg/dL (ref 0.76–1.27)
GFR calc Af Amer: 81 mL/min/{1.73_m2} (ref >59)
GFR calc non Af Amer: 70 mL/min/{1.73_m2} (ref >59)
Glucose: 165 mg/dL — ABNORMAL HIGH (ref 65–99)
Potassium: 4.8 mmol/L (ref 3.5–5.2)
Sodium: 137 mmol/L (ref 134–144)

## 2020-04-01 LAB — HEMOGLOBIN A1C
Est. average glucose Bld gHb Est-mCnc: 174 mg/dL
Hgb A1c MFr Bld: 7.7 % — ABNORMAL HIGH (ref 4.8–5.6)

## 2020-04-01 LAB — TSH: TSH: 2.71 u[IU]/mL (ref 0.450–4.500)

## 2020-04-04 ENCOUNTER — Other Ambulatory Visit: Payer: Self-pay

## 2020-04-04 NOTE — Telephone Encounter (Signed)
The pt called and left a message that he got his lab results off of his myChart

## 2020-04-11 DIAGNOSIS — H401133 Primary open-angle glaucoma, bilateral, severe stage: Secondary | ICD-10-CM | POA: Diagnosis not present

## 2020-04-11 DIAGNOSIS — E119 Type 2 diabetes mellitus without complications: Secondary | ICD-10-CM | POA: Diagnosis not present

## 2020-04-11 DIAGNOSIS — H2513 Age-related nuclear cataract, bilateral: Secondary | ICD-10-CM | POA: Diagnosis not present

## 2020-04-11 DIAGNOSIS — H47233 Glaucomatous optic atrophy, bilateral: Secondary | ICD-10-CM | POA: Diagnosis not present

## 2020-04-13 ENCOUNTER — Ambulatory Visit: Payer: Medicare Other | Admitting: Internal Medicine

## 2020-04-13 ENCOUNTER — Other Ambulatory Visit: Payer: Self-pay

## 2020-04-13 ENCOUNTER — Encounter: Payer: Self-pay | Admitting: Internal Medicine

## 2020-04-13 VITALS — BP 126/74 | HR 75 | Ht 68.8 in | Wt 301.6 lb

## 2020-04-13 DIAGNOSIS — E785 Hyperlipidemia, unspecified: Secondary | ICD-10-CM

## 2020-04-13 DIAGNOSIS — E1165 Type 2 diabetes mellitus with hyperglycemia: Secondary | ICD-10-CM | POA: Diagnosis not present

## 2020-04-13 MED ORDER — RYBELSUS 3 MG PO TABS: 3.0000 mg | ORAL_TABLET | Freq: Every day | ORAL | 6 refills | Status: DC

## 2020-04-13 NOTE — Patient Instructions (Addendum)
-   Start Rybelsus  3 mg , 1 tablet with Breakfast     Choose healthy, lower carb lower calorie snacks: toss salad,  vegetables, cottage cheese, peanut butter, low fat cheese / string cheese, lower sodium deli meat, tuna salad or chicken salad     HOW TO TREAT LOW BLOOD SUGARS (Blood sugar LESS THAN 70 MG/DL)  Please follow the RULE OF 15 for the treatment of hypoglycemia treatment (when your (blood sugars are less than 70 mg/dL)    STEP 1: Take 15 grams of carbohydrates when your blood sugar is low, which includes:   3-4 GLUCOSE TABS  OR  3-4 OZ OF JUICE OR REGULAR SODA OR  ONE TUBE OF GLUCOSE GEL     STEP 2: RECHECK blood sugar in 15 MINUTES STEP 3: If your blood sugar is still low at the 15 minute recheck --> then, go back to STEP 1 and treat AGAIN with another 15 grams of carbohydrates.

## 2020-04-13 NOTE — Progress Notes (Signed)
Name: Connor Becker.  MRN/ DOB: [id number], [birth year]   Age/ Sex: 69 y.o., male    PCP: Glendale Chard, MD   Reason for Endocrinology Evaluation: Type 2 Diabetes Mellitus     Date of Initial Endocrinology Visit: 04/13/2020     PATIENT IDENTIFIER: Connor Becker. is a 69 y.o. male with a past medical history of T2DM,OSA, hypogonadism and dyslipidemia . The patient presented for initial endocrinology clinic visit on 04/13/2020 for consultative assistance with his diabetes management.    HPI: Connor Becker was    Diagnosed with DM in 2014 Prior Medications tried/Intolerance: Metformin- GI side effect, Farxiga - GI side effects but he was on metformin at the same time but also noted dysuria but on actual diagnosis of UTI  Currently checking blood sugars 1 x / day Hypoglycemia episodes : no              Hemoglobin A1c has ranged from 6.6%  in 2020, peaking at 7.7% in 2021. Patient required assistance for hypoglycemia: no Patient has required hospitalization within the last 1 year from hyper or hypoglycemia: no   In terms of diet, the patient eats 2 meals a day but does not snack much . Avoids sugar-sweetened beverages    HOME DIABETES REGIMEN: N/A   Statin: Yes ACE-I/ARB: yes Prior Diabetic Education: yes   METER DOWNLOAD SUMMARY: Date range evaluated: 9/16-9/29/2021 Fingerstick Blood Glucose Tests = 13 Overall Mean FS Glucose = 163 Standard Deviation = 28  BG Ranges: Low = 124 High = 229   Hypoglycemic Events/30 Days: BG < 50 = 0 Episodes of symptomatic severe hypoglycemia = 0   DIABETIC COMPLICATIONS: Microvascular complications:    Denies: CKD, retinopathy, neuropathy  Last eye exam: Completed 03/2020  Macrovascular complications:    Denies: CAD, PVD, CVA   PAST HISTORY: Past Medical History:  Past Medical History:  Diagnosis Date   ALLERGIC RHINITIS    Chronic pain of both knees    DJD (degenerative joint disease) of knee     right   DM2 (diabetes mellitus, type 2) ([place])    GERD    Glaucoma    History of nuclear stress test    Myoview 12/17: EF 53, no ischemia, Low Risk   HYPERLIPIDEMIA    myalgias from Lipitor   HYPOGONADISM, MALE    Hypothyroidism    INSOMNIA-SLEEP DISORDER-UNSPEC    Joint pain    Obesity    OSTEOARTHRITIS    SLEEP APNEA, OBSTRUCTIVE    no CPAP; repeat sleep test pending 07/2016   Unspecified hypothyroidism 12/04/2013   Vitamin D deficiency    Past Surgical History:  Past Surgical History:  Procedure Laterality Date   NASAL SEPTUM SURGERY     TONSILLECTOMY     UVULOPALATOPHARYNGOPLASTY     VASECTOMY        Social History:  reports that he has never smoked. He has never used smokeless tobacco. He reports previous alcohol use. He reports that he does not use drugs. Family History:  Family History  Problem Relation Age of Onset   Diabetes Maternal Uncle    Heart attack Maternal Uncle    Hypertension Other    Prostate cancer Other    Heart disease Other    Heart attack Mother 63       s/p MI   Other Mother        pacemaker   Diabetes Mother    High blood pressure Mother    High  Cholesterol Mother    Thyroid disease Mother    Obesity Mother    Macular degeneration Father    Thyroid disease Father    Colon cancer Neg Hx      HOME MEDICATIONS: Allergies as of 04/13/2020      Reactions   Horseradish [armoracia Rusticana Ext (horseradish)]    Lipitor [atorvastatin] Other (See Comments)   Leg cramps   Metformin And Related Other (See Comments)   GI upset   Rye Grass Flower Pollen Extract [gramineae Pollens]    Soy Allergy       Medication List       Accurate as of April 13, 2020  9:54 AM. If you have any questions, ask your nurse or doctor.        aspirin 325 MG tablet Take 325 mg by mouth daily.   ibuprofen 200 MG tablet Commonly known as: ADVIL Take 400 mg by mouth 2 (two) times daily as needed for headache or  moderate pain.   IRON PO Take by mouth.   latanoprost 0.005 % ophthalmic solution Commonly known as: XALATAN 1 drop at bedtime.   levothyroxine 112 MCG tablet Commonly known as: SYNTHROID Take 1 tablet by mouth once daily   lisinopril 5 MG tablet Commonly known as: ZESTRIL Take 1 tablet (5 mg total) by mouth daily.   Magnesium 400 MG Caps Take 400 mg by mouth at bedtime.   methocarbamol 750 MG tablet Commonly known as: ROBAXIN Take 750 mg by mouth 4 (four) times daily. As needed   Multivitamin/Extra Vitamin D3 Chew Chew by mouth.   omeprazole 20 MG capsule Commonly known as: PRILOSEC Take 20 mg by mouth daily. prn   OneTouch Verio test strip Generic drug: glucose blood USE STRIP TO CHECK BLOOD SUGAR UP TO 4 TIMES PER DAY   pseudoephedrine 30 MG tablet Commonly known as: SUDAFED Take 60 mg by mouth 2 (two) times daily as needed for congestion.   rosuvastatin 10 MG tablet Commonly known as: Crestor Take 1 tablet (10 mg total) by mouth at bedtime.   tadalafil 5 MG tablet Commonly known as: CIALIS Take 5 mg by mouth daily.   testosterone enanthate 200 MG/ML injection Commonly known as: DELATESTRYL Inject 200 mg into the muscle every 30 (thirty) days. For IM use only   timolol 0.5 % ophthalmic solution Commonly known as: TIMOPTIC INSTILL 1 DROP INTO EACH EYE ONCE DAILY IN THE MORNING   Turmeric 500 MG Caps Take by mouth.   vitamin C 500 MG tablet Commonly known as: ASCORBIC ACID Take 1,000 mg by mouth daily.   Vitamin D (Ergocalciferol) 1.25 MG (50000 UNIT) Caps capsule Commonly known as: DRISDOL Take one capsule by mouth on Tues/Fridays        ALLERGIES: Allergies  Allergen Reactions   Horseradish [Armoracia Rusticana Ext (Horseradish)]    Lipitor [Atorvastatin] Other (See Comments)    Leg cramps   Metformin And Related Other (See Comments)    GI upset   Rye Grass Flower Pollen Extract [Gramineae Pollens]    Soy Allergy      REVIEW  OF SYSTEMS: A comprehensive ROS was conducted with the patient and is negative except as per HPI and below:  ROS    OBJECTIVE:   VITAL SIGNS: BP 126/74 (BP Location: Left Arm, Patient Position: Sitting, Cuff Size: Large)    Pulse 75    Ht 5' 8.8" (1.748 m)    Wt (!) 301 lb 9.6 oz (136.8 kg)  SpO2 94%    BMI 44.80 kg/m    PHYSICAL EXAM:  General: Pt appears well and is in NAD  Neck: General: Supple without adenopathy or carotid bruits. Thyroid: Thyroid size normal.  No goiter or nodules appreciated. No thyroid bruit.  Lungs: Clear with good BS bilat with no rales, rhonchi, or wheezes  Heart: RRR with normal S1 and S2 and no gallops; no murmurs; no rub  Abdomen: Normoactive bowel sounds, soft, nontender, without masses or organomegaly palpable  Extremities:  Lower extremities - No pretibial edema. No lesions.  Skin: Normal texture and temperature to palpation. No rash   Neuro: MS is good with appropriate affect, pt is alert and Ox3    DM foot exam: 04/13/2020  The skin of the feet is intact without sores or ulcerations. The pedal pulses are 2+ on right and 2+ on left. The sensation is intact to a screening 5.07, 10 gram monofilament bilaterally   DATA REVIEWED:  Lab Results  Component Value Date   HGBA1C 7.7 (H) 03/31/2020   HGBA1C 7.3 (H) 01/06/2020   HGBA1C 6.8 (H) 10/07/2019   Lab Results  Component Value Date   MICROALBUR 30 03/31/2019   LDLCALC 113 (H) 01/06/2020   CREATININE 1.06 03/31/2020   Lab Results  Component Value Date   MICRALBCREAT <30 03/31/2019    Lab Results  Component Value Date   CHOL 196 01/06/2020   HDL 35 (L) 01/06/2020   LDLCALC 113 (H) 01/06/2020   LDLDIRECT 148.0 11/17/2015   TRIG 276 (H) 01/06/2020   CHOLHDL 5.1 (H) 03/11/2019        ASSESSMENT / PLAN / RECOMMENDATIONS:   1) Type 2 Diabetes Mellitus, Sub-optimally controlled, Without complications - Most recent A1c of 7.7 %. Goal A1c < 7.0 %.    Plan: GENERAL: I have discussed  with the patient the pathophysiology of diabetes. We went over the natural progression of the disease. We talked about both insulin resistance and insulin deficiency. We stressed the importance of lifestyle changes including diet and exercise. I explained the complications associated with diabetes including retinopathy, nephropathy, neuropathy as well as increased risk of cardiovascular disease. We went over the benefit seen with glycemic control.    I explained to the patient that diabetic patients are at higher than normal risk for amputations.   No hx of pancreatitis    He is intolerant to metformin, he was on Farxiga but this caused frequency preventing him from sleeping at night, he is not too keen on restarting this  We discussed GLP-1 agonist, I cautioned him against GI side effects  MEDICATIONS:  Start Rybelsus 3 mg, 1 tablet daily with breakfast  EDUCATION / INSTRUCTIONS:  BG monitoring instructions: Patient is instructed to check his blood sugars 1 times a day, fasting .  Call [facility] if: BG persistently < 70   I reviewed the Rule of 15 for the treatment of hypoglycemia in detail with the patient. Literature supplied.   2) Diabetic complications:   Eye: Does not have known diabetic retinopathy.   Neuro/ Feet: Does not have known diabetic peripheral neuropathy.  Renal: Patient does not have known baseline CKD. He is not on an ACEI/ARB at present.   3) Dyslipidemia: Patient is on rosuvastatin 5 mg daily. LDL above goal at 113 mg/dL on 01/06/2020.  We discussed cardiovascular benefits of statins, this was recently started.         Signed electronically by: Mack Guise, MD  [facility]  [facility] [address] [place], [place] [address] Phone: [phone] FAX: [phone]   CC: Glendale Chard, [address] [place] [address] Phone: [phone]  Fax:  [phone]    Return to Endocrinology clinic as below: Future Appointments  Date Time Provider [place]  06/20/2020 11:00 AM Glendale Chard, MD [facility] None  10/19/2020  9:30 AM [facility] None  10/19/2020 10:15 AM Glendale Chard, MD [facility] None

## 2020-04-15 ENCOUNTER — Encounter: Payer: Self-pay | Admitting: Internal Medicine

## 2020-06-20 ENCOUNTER — Other Ambulatory Visit: Payer: Self-pay

## 2020-06-20 ENCOUNTER — Encounter: Payer: Self-pay | Admitting: Internal Medicine

## 2020-06-20 ENCOUNTER — Ambulatory Visit (INDEPENDENT_AMBULATORY_CARE_PROVIDER_SITE_OTHER): Payer: Medicare Other | Admitting: Internal Medicine

## 2020-06-20 VITALS — BP 136/80 | HR 80 | Temp 98.7°F | Ht 68.0 in | Wt 303.4 lb

## 2020-06-20 DIAGNOSIS — E1165 Type 2 diabetes mellitus with hyperglycemia: Secondary | ICD-10-CM | POA: Diagnosis not present

## 2020-06-20 DIAGNOSIS — Z0000 Encounter for general adult medical examination without abnormal findings: Secondary | ICD-10-CM

## 2020-06-20 DIAGNOSIS — E039 Hypothyroidism, unspecified: Secondary | ICD-10-CM

## 2020-06-20 DIAGNOSIS — E559 Vitamin D deficiency, unspecified: Secondary | ICD-10-CM | POA: Diagnosis not present

## 2020-06-20 DIAGNOSIS — I739 Peripheral vascular disease, unspecified: Secondary | ICD-10-CM

## 2020-06-20 LAB — POCT URINALYSIS DIPSTICK
Bilirubin, UA: NEGATIVE
Blood, UA: NEGATIVE
Glucose, UA: NEGATIVE
Ketones, UA: NEGATIVE
Leukocytes, UA: NEGATIVE
Nitrite, UA: NEGATIVE
Protein, UA: POSITIVE — AB
Spec Grav, UA: 1.025 (ref 1.010–1.025)
Urobilinogen, UA: 0.2 E.U./dL
pH, UA: 6 (ref 5.0–8.0)

## 2020-06-20 LAB — POCT UA - MICROALBUMIN
Albumin/Creatinine Ratio, Urine, POC: <30
Creatinine, POC: 200 mg/dL
Microalbumin Ur, POC: 80 mg/L

## 2020-06-20 NOTE — Progress Notes (Incomplete)
Rutherford Nail as a scribe for Maximino Greenland, MD.,have documented all relevant documentation on the behalf of Maximino Greenland, MD,as directed by  Maximino Greenland, MD while in the presence of Maximino Greenland, MD. This visit occurred during the SARS-CoV-2 public health emergency.  Safety protocols were in place, including screening questions prior to the visit, additional usage of staff PPE, and extensive cleaning of exam room while observing appropriate contact time as indicated for disinfecting solutions.  Subjective:     Patient ID: Connor Becker , male    DOB: 01-Oct-1950 , 69 y.o.   MRN: [id number]   Chief Complaint  Patient presents with  . Annual Exam  . Diabetes    HPI  He is here today for a full physical examination.   Diabetes He presents for his follow-up diabetic visit. He has type 2 diabetes mellitus. There are no hypoglycemic associated symptoms. Pertinent negatives for hypoglycemia include no dizziness or headaches. There are no diabetic associated symptoms. Pertinent negatives for diabetes include no fatigue, no polydipsia, no polyphagia and no polyuria. There are no hypoglycemic complications. Risk factors for coronary artery disease include diabetes mellitus, dyslipidemia, male sex, sedentary lifestyle and obesity. He is compliant with treatment most of the time. He is following a diabetic diet. He participates in exercise intermittently. His breakfast blood glucose is taken between 8-9 am. His breakfast blood glucose range is generally 130-140 mg/dl. Eye exam is not current.     Past Medical History:  Diagnosis Date  . ALLERGIC RHINITIS   . Chronic pain of both knees   . DJD (degenerative joint disease) of knee    right  . DM2 (diabetes mellitus, type 2) ([place])   . GERD   . Glaucoma   . History of nuclear stress test    Myoview 12/17: EF 53, no ischemia, Low Risk  . HYPERLIPIDEMIA    myalgias from Lipitor  . HYPOGONADISM, MALE   . Hypothyroidism    . INSOMNIA-SLEEP DISORDER-UNSPEC   . Joint pain   . Obesity   . OSTEOARTHRITIS   . SLEEP APNEA, OBSTRUCTIVE    no CPAP; repeat sleep test pending 07/2016  . Unspecified hypothyroidism 12/04/2013  . Vitamin D deficiency      Family History  Problem Relation Age of Onset  . Diabetes Maternal Uncle   . Heart attack Maternal Uncle   . Hypertension Other   . Prostate cancer Other   . Heart disease Other   . Heart attack Mother 44       s/p MI  . Other Mother        pacemaker  . Diabetes Mother   . High blood pressure Mother   . High Cholesterol Mother   . Thyroid disease Mother   . Obesity Mother   . Macular degeneration Father   . Thyroid disease Father   . Colon cancer Neg Hx      Current Outpatient Medications:  .  aspirin 325 MG tablet, Take 325 mg by mouth daily., Disp: , Rfl:  .  Ferrous Sulfate (IRON PO), Take by mouth., Disp: , Rfl:  .  ibuprofen (ADVIL,MOTRIN) 200 MG tablet, Take 400 mg by mouth 2 (two) times daily as needed for headache or moderate pain., Disp: , Rfl:  .  latanoprost (XALATAN) 0.005 % ophthalmic solution, 1 drop at bedtime., Disp: , Rfl:  .  levothyroxine (SYNTHROID) 112 MCG tablet, Take 1 tablet by mouth once daily, Disp: 90 tablet, Rfl: 0 .  lisinopril (ZESTRIL) 5 MG tablet, Take 1 tablet (5 mg total) by mouth daily., Disp: 90 tablet, Rfl: 1 .  Magnesium 400 MG CAPS, Take 400 mg by mouth at bedtime., Disp: 90 capsule, Rfl: 1 .  methocarbamol (ROBAXIN) 750 MG tablet, Take 750 mg by mouth 4 (four) times daily. As needed, Disp: , Rfl:  .  Multiple Vitamins-Minerals (MULTIVITAMIN/EXTRA VITAMIN D3) CHEW, Chew by mouth., Disp: , Rfl:  .  omeprazole (PRILOSEC) 20 MG capsule, Take 20 mg by mouth daily. prn, Disp: , Rfl:  .  ONETOUCH VERIO test strip, USE STRIP TO CHECK BLOOD SUGAR UP TO 4 TIMES PER DAY, Disp: 200 each, Rfl: 11 .  pseudoephedrine (SUDAFED) 30 MG tablet, Take 60 mg by mouth 2 (two) times daily as needed for congestion., Disp: , Rfl:  .   rosuvastatin (CRESTOR) 10 MG tablet, Take 1 tablet (10 mg total) by mouth at bedtime., Disp: 90 tablet, Rfl: 1 .  Semaglutide (RYBELSUS) 3 MG TABS, Take 3 mg by mouth daily with breakfast., Disp: 30 tablet, Rfl: 6 .  tadalafil (CIALIS) 5 MG tablet, Take 5 mg by mouth daily., Disp: , Rfl: 11 .  testosterone enanthate (DELATESTRYL) 200 MG/ML injection, Inject 200 mg into the muscle every 30 (thirty) days. For IM use only, Disp: , Rfl:  .  timolol (TIMOPTIC) 0.5 % ophthalmic solution, INSTILL 1 DROP INTO EACH EYE ONCE DAILY IN THE MORNING, Disp: , Rfl: 2 .  Turmeric 500 MG CAPS, Take by mouth., Disp: , Rfl:  .  vitamin C (ASCORBIC ACID) 500 MG tablet, Take 1,000 mg by mouth daily., Disp: , Rfl:  .  Vitamin D, Ergocalciferol, (DRISDOL) 1.25 MG (50000 UNIT) CAPS capsule, Take one capsule by mouth on Tues/Fridays, Disp: 24 capsule, Rfl: 0   Allergies  Allergen Reactions  . Horseradish [Armoracia Rusticana Ext (Horseradish)]   . Lipitor [Atorvastatin] Other (See Comments)    Leg cramps  . Metformin And Related Other (See Comments)    GI upset  . Rye Grass Flower Pollen Extract [Gramineae Pollens]   . Soy Allergy      Men's preventive visit. Patient Health Questionnaire (PHQ-2) is    Office Visit from 01/06/2020 in [facility]  PHQ-2 Total Score 0    . Patient is on a *** diet. Marital status: Married. Relevant history for alcohol use is:  Social History   Substance and Sexual Activity  Alcohol Use Not Currently   Comment: occassional beer  . Relevant history for tobacco use is:  Social History   Tobacco Use  Smoking Status Never Smoker  Smokeless Tobacco Never Used  .   Review of Systems  Constitutional: Negative.  Negative for fatigue.  HENT: Negative.   Cardiovascular: Negative.   Endocrine: Negative for polydipsia, polyphagia and polyuria.  Musculoskeletal: Negative.   Skin: Negative.   Neurological: Negative for dizziness and headaches.   Psychiatric/Behavioral: Negative.      Today's Vitals   06/20/20 1116  BP: 136/80  Pulse: 80  Temp: 98.7 F (37.1 C)  TempSrc: Oral  Weight: (!) 303 lb 6.4 oz (137.6 kg)  Height: _0  (1.727 m)  PainSc: 0-No pain   Body mass index is 46.13 kg/m.  Wt Readings from Last 3 Encounters:  06/20/20 (!) 303 lb 6.4 oz (137.6 kg)  04/13/20 (!) 301 lb 9.6 oz (136.8 kg)  03/31/20 (!) 304 lb 9.6 oz (138.2 kg)   BP Readings from Last 3 Encounters:  06/20/20 136/80  04/13/20 126/74  03/31/20 134/82    Objective:  Physical Exam      Assessment And Plan:    1. Routine general medical examination at health care facility Comments: A full exam was performed.  DRE deferred.  - CBC - CMP14+EGFR - TSH  2. Type 2 diabetes mellitus with hyperglycemia, without long-term current use of insulin (HCC) - POCT Urinalysis Dipstick (81002) - POCT UA - Microalbumin - EKG 12-Lead - Hemoglobin A1c  3. Vitamin D deficiency disease - VITAMIN D 25 Hydroxy (Vit-D Deficiency, Fractures)     Patient was given opportunity to ask questions. Patient verbalized understanding of the plan and was able to repeat key elements of the plan. All questions were answered to their satisfaction.   Maximino Greenland, MD   I, Maximino Greenland, MD, have reviewed all documentation for this visit. The documentation on 06/20/20 for the exam, diagnosis, procedures, and orders are all accurate and complete.  THE PATIENT IS ENCOURAGED TO PRACTICE SOCIAL DISTANCING DUE TO THE COVID-19 PANDEMIC.

## 2020-06-20 NOTE — Patient Instructions (Signed)
Diabetes Mellitus and Exercise Exercising regularly is important for your overall health, especially when you have diabetes (diabetes mellitus). Exercising is not only about losing weight. It has many other health benefits, such as increasing muscle strength and bone density and reducing body fat and stress. This leads to improved fitness, flexibility, and endurance, all of which result in better overall health. Exercise has additional benefits for people with diabetes, including:  Reducing appetite.  Helping to lower and control blood glucose.  Lowering blood pressure.  Helping to control amounts of fatty substances (lipids) in the blood, such as cholesterol and triglycerides.  Helping the body to respond better to insulin (improving insulin sensitivity).  Reducing how much insulin the body needs.  Decreasing the risk for heart disease by: ? Lowering cholesterol and triglyceride levels. ? Increasing the levels of good cholesterol. ? Lowering blood glucose levels. What is my activity plan? Your health care provider or certified diabetes educator can help you make a plan for the type and frequency of exercise (activity plan) that works for you. Make sure that you:  Do at least 150 minutes of moderate-intensity or vigorous-intensity exercise each week. This could be brisk walking, biking, or water aerobics. ? Do stretching and strength exercises, such as yoga or weightlifting, at least 2 times a week. ? Spread out your activity over at least 3 days of the week.  Get some form of physical activity every day. ? Do not go more than 2 days in a row without some kind of physical activity. ? Avoid being inactive for more than 30 minutes at a time. Take frequent breaks to walk or stretch.  Choose a type of exercise or activity that you enjoy, and set realistic goals.  Start slowly, and gradually increase the intensity of your exercise over time. What do I need to know about managing my  diabetes?   Check your blood glucose before and after exercising. ? If your blood glucose is 240 mg/dL (13.3 mmol/L) or higher before you exercise, check your urine for ketones. If you have ketones in your urine, do not exercise until your blood glucose returns to normal. ? If your blood glucose is 100 mg/dL (5.6 mmol/L) or lower, eat a snack containing 15-20 grams of carbohydrate. Check your blood glucose 15 minutes after the snack to make sure that your level is above 100 mg/dL (5.6 mmol/L) before you start your exercise.  Know the symptoms of low blood glucose (hypoglycemia) and how to treat it. Your risk for hypoglycemia increases during and after exercise. Common symptoms of hypoglycemia can include: ? Hunger. ? Anxiety. ? Sweating and feeling clammy. ? Confusion. ? Dizziness or feeling light-headed. ? Increased heart rate or palpitations. ? Blurry vision. ? Tingling or numbness around the mouth, lips, or tongue. ? Tremors or shakes. ? Irritability.  Keep a rapid-acting carbohydrate snack available before, during, and after exercise to help prevent or treat hypoglycemia.  Avoid injecting insulin into areas of the body that are going to be exercised. For example, avoid injecting insulin into: ? The arms, when playing tennis. ? The legs, when jogging.  Keep records of your exercise habits. Doing this can help you and your health care provider adjust your diabetes management plan as needed. Write down: ? Food that you eat before and after you exercise. ? Blood glucose levels before and after you exercise. ? The type and amount of exercise you have done. ? When your insulin is expected to peak, if you use   insulin. Avoid exercising at times when your insulin is peaking.  When you start a new exercise or activity, work with your health care provider to make sure the activity is safe for you, and to adjust your insulin, medicines, or food intake as needed.  Drink plenty of water while  you exercise to prevent dehydration or heat stroke. Drink enough fluid to keep your urine clear or pale yellow. Summary  Exercising regularly is important for your overall health, especially when you have diabetes (diabetes mellitus).  Exercising has many health benefits, such as increasing muscle strength and bone density and reducing body fat and stress.  Your health care provider or certified diabetes educator can help you make a plan for the type and frequency of exercise (activity plan) that works for you.  When you start a new exercise or activity, work with your health care provider to make sure the activity is safe for you, and to adjust your insulin, medicines, or food intake as needed. This information is not intended to replace advice given to you by your health care provider. Make sure you discuss any questions you have with your health care provider. Document Revised: 01/24/2017 Document Reviewed: 12/12/2015 Elsevier Patient Education  2020 [place] Maintenance, Male Adopting a healthy lifestyle and getting preventive care are important in promoting health and wellness. Ask your health care provider about:  The right schedule for you to have regular tests and exams.  Things you can do on your own to prevent diseases and keep yourself healthy. What should I know about diet, weight, and exercise? Eat a healthy diet   Eat a diet that includes plenty of vegetables, fruits, low-fat dairy products, and lean protein.  Do not eat a lot of foods that are high in solid fats, added sugars, or sodium. Maintain a healthy weight Body mass index (BMI) is a measurement that can be used to identify possible weight problems. It estimates body fat based on height and weight. Your health care provider can help determine your BMI and help you achieve or maintain a healthy weight. Get regular exercise Get regular exercise. This is one of the most important things you can do for your  health. Most adults should:  Exercise for at least 150 minutes each week. The exercise should increase your heart rate and make you sweat (moderate-intensity exercise).  Do strengthening exercises at least twice a week. This is in addition to the moderate-intensity exercise.  Spend less time sitting. Even light physical activity can be beneficial. Watch cholesterol and blood lipids Have your blood tested for lipids and cholesterol at 71 years of age, then have this test every 5 years. You may need to have your cholesterol levels checked more often if:  Your lipid or cholesterol levels are high.  You are older than 71 years of age.  You are at high risk for heart disease. What should I know about cancer screening? Many types of cancers can be detected early and may often be prevented. Depending on your health history and family history, you may need to have cancer screening at various ages. This may include screening for:  Colorectal cancer.  Prostate cancer.  Skin cancer.  Lung cancer. What should I know about heart disease, diabetes, and high blood pressure? Blood pressure and heart disease  High blood pressure causes heart disease and increases the risk of stroke. This is more likely to develop in people who have high blood pressure readings, are of African descent,  or are overweight.  Talk with your health care provider about your target blood pressure readings.  Have your blood pressure checked: ? Every 3-5 years if you are 70-28 years of age. ? Every year if you are 84 years old or older.  If you are between the ages of 34 and 66 and are a current or former smoker, ask your health care provider if you should have a one-time screening for abdominal aortic aneurysm (AAA). Diabetes Have regular diabetes screenings. This checks your fasting blood sugar level. Have the screening done:  Once every three years after age 13 if you are at a normal weight and have a low risk for  diabetes.  More often and at a younger age if you are overweight or have a high risk for diabetes. What should I know about preventing infection? Hepatitis B If you have a higher risk for hepatitis B, you should be screened for this virus. Talk with your health care provider to find out if you are at risk for hepatitis B infection. Hepatitis C Blood testing is recommended for:  Everyone born from [birth year] through 1967.  Anyone with known risk factors for hepatitis C. Sexually transmitted infections (STIs)  You should be screened each year for STIs, including gonorrhea and chlamydia, if: ? You are sexually active and are younger than 71 years of age. ? You are older than 71 years of age and your health care provider tells you that you are at risk for this type of infection. ? Your sexual activity has changed since you were last screened, and you are at increased risk for chlamydia or gonorrhea. Ask your health care provider if you are at risk.  Ask your health care provider about whether you are at high risk for HIV. Your health care provider may recommend a prescription medicine to help prevent HIV infection. If you choose to take medicine to prevent HIV, you should first get tested for HIV. You should then be tested every 3 months for as long as you are taking the medicine. Follow these instructions at home: Lifestyle  Do not use any products that contain nicotine or tobacco, such as cigarettes, e-cigarettes, and chewing tobacco. If you need help quitting, ask your health care provider.  Do not use street drugs.  Do not share needles.  Ask your health care provider for help if you need support or information about quitting drugs. Alcohol use  Do not drink alcohol if your health care provider tells you not to drink.  If you drink alcohol: ? Limit how much you have to 0-2 drinks a day. ? Be aware of how much alcohol is in your drink. In the [place] one drink equals one 12 oz bottle of  beer (355 mL), one 5 oz glass of wine (148 mL), or one 1 oz glass of hard liquor (44 mL). General instructions  Schedule regular health, dental, and eye exams.  Stay current with your vaccines.  Tell your health care provider if: ? You often feel depressed. ? You have ever been abused or do not feel safe at home. Summary  Adopting a healthy lifestyle and getting preventive care are important in promoting health and wellness.  Follow your health care provider's instructions about healthy diet, exercising, and getting tested or screened for diseases.  Follow your health care provider's instructions on monitoring your cholesterol and blood pressure. This information is not intended to replace advice given to you by your health care provider. Make sure  you discuss any questions you have with your health care provider. Document Revised: 06/25/2018 Document Reviewed: 06/25/2018 Elsevier Patient Education  2020 [employer].

## 2020-06-20 NOTE — Progress Notes (Signed)
Rutherford Nail as a scribe for Maximino Greenland, MD.,have documented all relevant documentation on the behalf of Maximino Greenland, MD,as directed by  Maximino Greenland, MD while in the presence of Maximino Greenland, MD. This visit occurred during the SARS-CoV-2 public health emergency.  Safety protocols were in place, including screening questions prior to the visit, additional usage of staff PPE, and extensive cleaning of exam room while observing appropriate contact time as indicated for disinfecting solutions.  Subjective:     Patient ID: Connor Becker , male    DOB: 23-Jul-1950 , 69 y.o.   MRN: [id number]   Chief Complaint  Patient presents with  . Annual Exam  . Diabetes    HPI  He is here today for a full physical examination. He has no specific concerns or complaints at this time.   Diabetes He presents for his follow-up diabetic visit. He has type 2 diabetes mellitus. There are no hypoglycemic associated symptoms. Pertinent negatives for hypoglycemia include no dizziness or headaches. There are no diabetic associated symptoms. Pertinent negatives for diabetes include no fatigue, no polydipsia, no polyphagia and no polyuria. There are no hypoglycemic complications. Risk factors for coronary artery disease include diabetes mellitus, dyslipidemia, male sex, sedentary lifestyle and obesity. He is compliant with treatment most of the time. He is following a diabetic diet. He participates in exercise intermittently. His breakfast blood glucose is taken between 8-9 am. His breakfast blood glucose range is generally 130-140 mg/dl. Eye exam is not current.     Past Medical History:  Diagnosis Date  . ALLERGIC RHINITIS   . Chronic pain of both knees   . DJD (degenerative joint disease) of knee    right  . DM2 (diabetes mellitus, type 2) ([place])   . GERD   . Glaucoma   . History of nuclear stress test    Myoview 12/17: EF 53, no ischemia, Low Risk  . HYPERLIPIDEMIA    myalgias  from Lipitor  . HYPOGONADISM, MALE   . Hypothyroidism   . INSOMNIA-SLEEP DISORDER-UNSPEC   . Joint pain   . Obesity   . OSTEOARTHRITIS   . SLEEP APNEA, OBSTRUCTIVE    no CPAP; repeat sleep test pending 07/2016  . Unspecified hypothyroidism 12/04/2013  . Vitamin D deficiency      Family History  Problem Relation Age of Onset  . Diabetes Maternal Uncle   . Heart attack Maternal Uncle   . Hypertension Other   . Prostate cancer Other   . Heart disease Other   . Heart attack Mother 42       s/p MI  . Other Mother        pacemaker  . Diabetes Mother   . High blood pressure Mother   . High Cholesterol Mother   . Thyroid disease Mother   . Obesity Mother   . Macular degeneration Father   . Thyroid disease Father   . Colon cancer Neg Hx     No current facility-administered medications for this visit. No current outpatient medications on file.  Facility-Administered Medications Ordered in Other Visits:  .  enoxaparin (LOVENOX) injection 70 mg, 70 mg, Subcutaneous, Q24H, Emiliano Dyer, RPH, 70 mg at 07/03/20 1820 .  feeding supplement (KATE FARMS STANDARD 1.4) liquid 325 mL, 325 mL, Oral, BID BM, Lorella Nimrod, MD, 325 mL at 07/03/20 1820 .  ketorolac (TORADOL) 15 MG/ML injection 15 mg, 15 mg, Intravenous, Q6H PRN, Elwyn Reach, MD .  lactated  ringers infusion, , Intravenous, Continuous, Kayleen Memos, [place], Last Rate: 100 mL/hr at 07/03/20 1215, New Bag at 07/03/20 1215 .  [START ON 07/04/2020] levothyroxine (SYNTHROID) tablet 112 mcg, 112 mcg, Oral, QAC breakfast, Hall, Carole N, DO .  loperamide (IMODIUM) capsule 2 mg, 2 mg, Oral, PRN, Irene Pap N, DO .  magnesium oxide (MAG-OX) tablet 400 mg, 400 mg, Oral, QHS, Hall, Carole N, DO, 400 mg at 07/03/20 2205 .  multivitamin with minerals tablet 1 tablet, 1 tablet, Oral, Daily, Lorella Nimrod, MD, 1 tablet at 07/03/20 1210 .  ondansetron (ZOFRAN) tablet 4 mg, 4 mg, Oral, Q6H PRN **OR** ondansetron (ZOFRAN) injection 4 mg, 4  mg, Intravenous, Q6H PRN, Elwyn Reach, MD, 4 mg at 07/01/20 2301 .  pantoprazole (PROTONIX) EC tablet 40 mg, 40 mg, Oral, Daily, Irene Pap N, DO, 40 mg at 07/03/20 1820 .  promethazine (PHENERGAN) injection 12.5 mg, 12.5 mg, Intravenous, Q6H PRN, Jonelle Sidle, Mohammad L, MD, 12.5 mg at 07/02/20 1426 .  timolol (TIMOPTIC) 0.5 % ophthalmic solution 1 drop, 1 drop, Both Eyes, Daily, Hall, Carole N, DO, 1 drop at 07/03/20 1823   Allergies  Allergen Reactions  . Horseradish [Armoracia Rusticana Ext (Horseradish)]   . Lipitor [Atorvastatin] Other (See Comments)    Leg cramps  . Metformin And Related Other (See Comments)    GI upset  . [place] Grass Flower Pollen Extract [Gramineae Pollens]   . Soy Allergy      Men's preventive visit. Patient Health Questionnaire (PHQ-2) is  [place] [facility] Visit from 01/06/2020 in [facility]  PHQ-2 Total Score 0    . Patient is on a liberal diet. Marital status: Married. Relevant history for alcohol use is:  Social History   Substance and Sexual Activity  Alcohol Use Not Currently   Comment: occassional beer  . Relevant history for tobacco use is:  Social History   Tobacco Use  Smoking Status Never Smoker  Smokeless Tobacco Never Used  .   Review of Systems  Constitutional: Negative.  Negative for fatigue.  HENT: Negative.   Eyes: Negative.   Respiratory: Negative.   Cardiovascular: Negative.        During questions regarding foot exam, he admits to calf pain with ambulation. This does resolve with rest. He denies having sx at rest  Gastrointestinal: Negative.   Endocrine: Negative.  Negative for polydipsia, polyphagia and polyuria.  Musculoskeletal: Negative.   Skin: Negative.   Allergic/Immunologic: Negative.   Neurological: Negative.  Negative for dizziness and headaches.  Psychiatric/Behavioral: Negative.      Today's Vitals   06/20/20 1116  BP: 136/80  Pulse: 80  Temp: 98.7 F (37.1 C)  TempSrc: Oral   Weight: (!) 303 lb 6.4 oz (137.6 kg)  Height: _0  (1.727 m)  PainSc: 0-No pain   Body mass index is 46.13 kg/m.  Wt Readings from Last 3 Encounters:  07/01/20 (!) 302 lb (137 kg)  06/20/20 (!) 303 lb 6.4 oz (137.6 kg)  04/13/20 (!) 301 lb 9.6 oz (136.8 kg)   Objective:  Physical Exam Vitals and nursing note reviewed.  Constitutional:      Appearance: Normal appearance. He is obese.  HENT:     Head: Normocephalic and atraumatic.     Right Ear: Tympanic membrane, ear canal and external ear normal.     Left Ear: Tympanic membrane, ear canal and external ear normal.     Nose:     Comments: Deferred, masked  Mouth/Throat:     Comments: Deferred, masked Eyes:     Extraocular Movements: Extraocular movements intact.     Conjunctiva/sclera: Conjunctivae normal.     Pupils: Pupils are equal, round, and reactive to light.  Cardiovascular:     Rate and Rhythm: Normal rate and regular rhythm.     Pulses: Normal pulses.          Dorsalis pedis pulses are 2+ on the right side and 2+ on the left side.     Heart sounds: Normal heart sounds.  Pulmonary:     Effort: Pulmonary effort is normal.     Breath sounds: Normal breath sounds.  Chest:  Breasts:     Right: Normal. No swelling, bleeding, inverted nipple, mass or nipple discharge.     Left: Normal. No swelling, bleeding, inverted nipple, mass or nipple discharge.    Abdominal:     General: Bowel sounds are normal.     Palpations: Abdomen is soft.     Comments: Obese soft  Genitourinary:    Comments: Deferred Musculoskeletal:        General: Normal range of motion.     Cervical back: Normal range of motion and neck supple.  Feet:     Right foot:     Protective Sensation: 5 sites tested. 5 sites sensed.     Skin integrity: Callus and dry skin present.     Toenail Condition: Right toenails are abnormally thick.     Left foot:     Protective Sensation: 5 sites tested. 5 sites sensed.     Skin integrity: Callus and dry  skin present.     Toenail Condition: Left toenails are abnormally thick.  Skin:    General: Skin is warm.  Neurological:     General: No focal deficit present.     Mental Status: He is alert.  Psychiatric:        Mood and Affect: Mood normal.        Behavior: Behavior normal.         Assessment And Plan:    1. Routine general medical examination at health care facility Comments: A full exam was performed.  DRE deferred as per patient. He will c/w Urology for his prostate exams. PATIENT IS ADVISED TO GET 30-45 MINUTES REGULAR EXERCISE NO LESS THAN FOUR TO FIVE DAYS PER WEEK - BOTH WEIGHTBEARING EXERCISES AND AEROBIC ARE RECOMMENDED.  PATIENT IS ADVISED TO FOLLOW A HEALTHY DIET WITH AT LEAST SIX FRUITS/VEGGIES PER DAY, DECREASE INTAKE OF RED MEAT, AND TO INCREASE FISH INTAKE TO TWO DAYS PER WEEK.  MEATS/FISH SHOULD NOT BE FRIED, BAKED OR BROILED IS PREFERABLE.  I SUGGEST WEARING SPF 50 SUNSCREEN ON EXPOSED PARTS AND ESPECIALLY WHEN IN THE DIRECT SUNLIGHT FOR AN EXTENDED PERIOD OF TIME.  PLEASE AVOID FAST FOOD RESTAURANTS AND INCREASE YOUR WATER INTAKE.  2. Type 2 diabetes mellitus with hyperglycemia, without long-term current use of insulin (HCC) Comments: Diabetic foot exam was performed. He was given sample of Libre sensor. He will use this to check his sugars. I DISCUSSED WITH THE PATIENT AT LENGTH REGARDING THE GOALS OF GLYCEMIC CONTROL AND POSSIBLE LONG-TERM COMPLICATIONS.  I  ALSO STRESSED THE IMPORTANCE OF COMPLIANCE WITH HOME GLUCOSE MONITORING, DIETARY RESTRICTIONS INCLUDING AVOIDANCE OF SUGARY DRINKS/PROCESSED FOODS,  ALONG WITH REGULAR EXERCISE.  I  ALSO STRESSED THE IMPORTANCE OF ANNUAL EYE EXAMS, SELF FOOT CARE AND COMPLIANCE WITH OFFICE VISITS.  - POCT Urinalysis Dipstick (81002) - POCT UA - Microalbumin - EKG 12-Lead -  Hemoglobin A1c - CBC - CMP14+EGFR - TSH  3. Primary hypothyroidism Comments:  I will check TSh and adjust meds as needed.   4. Vitamin D deficiency  disease Comments: I will check vitamin D level and supplement as needed.  - VITAMIN D 25 Hydroxy (Vit-D Deficiency, Fractures)  5. Intermittent claudication ([place]) Comments: He agrees to referral for ABIs. If abnormal, will schedule f/u with Vascular.  He is encouraged to incorporate more exercise into his daily routine.  - VAS [place] ABI WITH/WO TBI; Future     Patient was given opportunity to ask questions. Patient verbalized understanding of the plan and was able to repeat key elements of the plan. All questions were answered to their satisfaction.   Maximino Greenland, MD   I, Maximino Greenland, MD, have reviewed all documentation for this visit. The documentation on 07/03/20 for the exam, diagnosis, procedures, and orders are all accurate and complete.  THE PATIENT IS ENCOURAGED TO PRACTICE SOCIAL DISTANCING DUE TO THE COVID-19 PANDEMIC.

## 2020-06-21 LAB — CBC
Hematocrit: 43.2 % (ref 37.5–51.0)
Hemoglobin: 15.1 g/dL (ref 13.0–17.7)
MCH: 31.7 pg (ref 26.6–33.0)
MCHC: 35 g/dL (ref 31.5–35.7)
MCV: 91 fL (ref 79–97)
Platelets: 239 10*3/uL (ref 150–450)
RBC: 4.77 x10E6/uL (ref 4.14–5.80)
RDW: 13.1 % (ref 11.6–15.4)
WBC: 6.5 10*3/uL (ref 3.4–10.8)

## 2020-06-21 LAB — CMP14+EGFR
ALT: 26 IU/L (ref 0–44)
AST: 26 IU/L (ref 0–40)
Albumin/Globulin Ratio: 1.7 (ref 1.2–2.2)
Albumin: 4.3 g/dL (ref 3.7–4.7)
Alkaline Phosphatase: 70 IU/L (ref 44–121)
BUN/Creatinine Ratio: 14 (ref 10–24)
BUN: 15 mg/dL (ref 8–27)
Bilirubin Total: 0.4 mg/dL (ref 0.0–1.2)
CO2: 24 mmol/L (ref 20–29)
Calcium: 9 mg/dL (ref 8.6–10.2)
Chloride: 100 mmol/L (ref 96–106)
Creatinine, Ser: 1.05 mg/dL (ref 0.76–1.27)
GFR calc Af Amer: 82 mL/min/{1.73_m2} (ref >59)
GFR calc non Af Amer: 71 mL/min/{1.73_m2} (ref >59)
Globulin, Total: 2.6 g/dL (ref 1.5–4.5)
Glucose: 134 mg/dL — ABNORMAL HIGH (ref 65–99)
Potassium: 4.1 mmol/L (ref 3.5–5.2)
Sodium: 139 mmol/L (ref 134–144)
Total Protein: 6.9 g/dL (ref 6.0–8.5)

## 2020-06-21 LAB — HEMOGLOBIN A1C
Est. average glucose Bld gHb Est-mCnc: 171 mg/dL
Hgb A1c MFr Bld: 7.6 % — ABNORMAL HIGH (ref 4.8–5.6)

## 2020-06-21 LAB — VITAMIN D 25 HYDROXY (VIT D DEFICIENCY, FRACTURES): Vit D, 25-Hydroxy: 26.7 ng/mL — ABNORMAL LOW (ref 30.0–100.0)

## 2020-06-21 LAB — TSH: TSH: 3.51 u[IU]/mL (ref 0.450–4.500)

## 2020-06-28 ENCOUNTER — Ambulatory Visit: Payer: Medicare Other

## 2020-06-28 ENCOUNTER — Other Ambulatory Visit: Payer: Self-pay

## 2020-06-28 VITALS — BP 126/88 | HR 102 | Temp 98.3°F | Ht 68.0 in

## 2020-06-28 DIAGNOSIS — Z1152 Encounter for screening for COVID-19: Secondary | ICD-10-CM

## 2020-06-29 LAB — SARS-COV-2, NAA 2 DAY TAT

## 2020-06-29 LAB — NOVEL CORONAVIRUS, NAA: SARS-CoV-2, NAA: NOT DETECTED

## 2020-07-01 ENCOUNTER — Telehealth: Payer: Self-pay

## 2020-07-01 ENCOUNTER — Inpatient Hospital Stay (HOSPITAL_COMMUNITY)
Admission: EM | Admit: 2020-07-01 | Discharge: 2020-07-04 | DRG: 392 | Disposition: A | Discharge status: Home / Self Care | Payer: Medicare Other | Attending: Internal Medicine | Admitting: Internal Medicine

## 2020-07-01 ENCOUNTER — Encounter: Payer: Self-pay | Admitting: Internal Medicine

## 2020-07-01 ENCOUNTER — Emergency Department (HOSPITAL_COMMUNITY): Payer: Medicare Other

## 2020-07-01 ENCOUNTER — Other Ambulatory Visit: Payer: Self-pay

## 2020-07-01 ENCOUNTER — Ambulatory Visit: Admit: 2020-07-01 | Discharge status: Absent without leave | Payer: Medicare Other

## 2020-07-01 DIAGNOSIS — R339 Retention of urine, unspecified: Secondary | ICD-10-CM | POA: Diagnosis not present

## 2020-07-01 DIAGNOSIS — G4700 Insomnia, unspecified: Secondary | ICD-10-CM | POA: Diagnosis not present

## 2020-07-01 DIAGNOSIS — Z743 Need for continuous supervision: Secondary | ICD-10-CM | POA: Diagnosis not present

## 2020-07-01 DIAGNOSIS — K219 Gastro-esophageal reflux disease without esophagitis: Secondary | ICD-10-CM | POA: Diagnosis not present

## 2020-07-01 DIAGNOSIS — R197 Diarrhea, unspecified: Secondary | ICD-10-CM | POA: Diagnosis not present

## 2020-07-01 DIAGNOSIS — Z833 Family history of diabetes mellitus: Secondary | ICD-10-CM | POA: Diagnosis not present

## 2020-07-01 DIAGNOSIS — N179 Acute kidney failure, unspecified: Secondary | ICD-10-CM | POA: Diagnosis present

## 2020-07-01 DIAGNOSIS — Z6841 Body Mass Index (BMI) 40.0 and over, adult: Secondary | ICD-10-CM

## 2020-07-01 DIAGNOSIS — A0811 Acute gastroenteropathy due to Norwalk agent: Secondary | ICD-10-CM | POA: Diagnosis not present

## 2020-07-01 DIAGNOSIS — F5104 Psychophysiologic insomnia: Secondary | ICD-10-CM | POA: Diagnosis present

## 2020-07-01 DIAGNOSIS — Z8249 Family history of ischemic heart disease and other diseases of the circulatory system: Secondary | ICD-10-CM

## 2020-07-01 DIAGNOSIS — E785 Hyperlipidemia, unspecified: Secondary | ICD-10-CM | POA: Diagnosis present

## 2020-07-01 DIAGNOSIS — E039 Hypothyroidism, unspecified: Secondary | ICD-10-CM | POA: Diagnosis present

## 2020-07-01 DIAGNOSIS — M1711 Unilateral primary osteoarthritis, right knee: Secondary | ICD-10-CM | POA: Diagnosis present

## 2020-07-01 DIAGNOSIS — Z83438 Family history of other disorder of lipoprotein metabolism and other lipidemia: Secondary | ICD-10-CM | POA: Diagnosis not present

## 2020-07-01 DIAGNOSIS — G4733 Obstructive sleep apnea (adult) (pediatric): Secondary | ICD-10-CM | POA: Diagnosis present

## 2020-07-01 DIAGNOSIS — Z7982 Long term (current) use of aspirin: Secondary | ICD-10-CM | POA: Diagnosis not present

## 2020-07-01 DIAGNOSIS — Z8349 Family history of other endocrine, nutritional and metabolic diseases: Secondary | ICD-10-CM | POA: Diagnosis not present

## 2020-07-01 DIAGNOSIS — Z79890 Hormone replacement therapy (postmenopausal): Secondary | ICD-10-CM

## 2020-07-01 DIAGNOSIS — A09 Infectious gastroenteritis and colitis, unspecified: Secondary | ICD-10-CM | POA: Diagnosis not present

## 2020-07-01 DIAGNOSIS — H409 Unspecified glaucoma: Secondary | ICD-10-CM | POA: Diagnosis present

## 2020-07-01 DIAGNOSIS — Z888 Allergy status to other drugs, medicaments and biological substances status: Secondary | ICD-10-CM

## 2020-07-01 DIAGNOSIS — R0902 Hypoxemia: Secondary | ICD-10-CM | POA: Diagnosis not present

## 2020-07-01 DIAGNOSIS — R933 Abnormal findings on diagnostic imaging of other parts of digestive tract: Secondary | ICD-10-CM | POA: Diagnosis not present

## 2020-07-01 DIAGNOSIS — Z79899 Other long term (current) drug therapy: Secondary | ICD-10-CM

## 2020-07-01 DIAGNOSIS — Z20822 Contact with and (suspected) exposure to covid-19: Secondary | ICD-10-CM | POA: Diagnosis present

## 2020-07-01 DIAGNOSIS — E66812 Obesity, class 2: Secondary | ICD-10-CM

## 2020-07-01 DIAGNOSIS — E876 Hypokalemia: Secondary | ICD-10-CM | POA: Diagnosis not present

## 2020-07-01 DIAGNOSIS — R194 Change in bowel habit: Secondary | ICD-10-CM | POA: Diagnosis not present

## 2020-07-01 DIAGNOSIS — A084 Viral intestinal infection, unspecified: Secondary | ICD-10-CM | POA: Diagnosis not present

## 2020-07-01 DIAGNOSIS — A419 Sepsis, unspecified organism: Secondary | ICD-10-CM

## 2020-07-01 DIAGNOSIS — K760 Fatty (change of) liver, not elsewhere classified: Secondary | ICD-10-CM | POA: Diagnosis not present

## 2020-07-01 DIAGNOSIS — E119 Type 2 diabetes mellitus without complications: Secondary | ICD-10-CM

## 2020-07-01 DIAGNOSIS — R404 Transient alteration of awareness: Secondary | ICD-10-CM | POA: Diagnosis not present

## 2020-07-01 DIAGNOSIS — Z91018 Allergy to other foods: Secondary | ICD-10-CM

## 2020-07-01 DIAGNOSIS — E1165 Type 2 diabetes mellitus with hyperglycemia: Secondary | ICD-10-CM | POA: Diagnosis not present

## 2020-07-01 DIAGNOSIS — R110 Nausea: Secondary | ICD-10-CM | POA: Diagnosis not present

## 2020-07-01 DIAGNOSIS — K5730 Diverticulosis of large intestine without perforation or abscess without bleeding: Secondary | ICD-10-CM | POA: Diagnosis not present

## 2020-07-01 DIAGNOSIS — R109 Unspecified abdominal pain: Secondary | ICD-10-CM | POA: Diagnosis not present

## 2020-07-01 LAB — POC OCCULT BLOOD, ED: Fecal Occult Bld: POSITIVE — AB

## 2020-07-01 LAB — C DIFFICILE (CDIFF) QUICK SCRN (NO PCR REFLEX)
C Diff antigen: NEGATIVE
C Diff interpretation: NOT DETECTED
C Diff toxin: NEGATIVE

## 2020-07-01 LAB — CBC
HCT: 50.5 % (ref 39.0–52.0)
Hemoglobin: 17 g/dL (ref 13.0–17.0)
MCH: 31 pg (ref 26.0–34.0)
MCHC: 33.7 g/dL (ref 30.0–36.0)
MCV: 92 fL (ref 80.0–100.0)
Platelets: 318 10*3/uL (ref 150–400)
RBC: 5.49 MIL/uL (ref 4.22–5.81)
RDW: 13.1 % (ref 11.5–15.5)
WBC: 24.9 10*3/uL — ABNORMAL HIGH (ref 4.0–10.5)
nRBC: 0 % (ref 0.0–0.2)

## 2020-07-01 LAB — COMPREHENSIVE METABOLIC PANEL
ALT: 26 U/L (ref 0–44)
AST: 20 U/L (ref 15–41)
Albumin: 3.7 g/dL (ref 3.5–5.0)
Alkaline Phosphatase: 70 U/L (ref 38–126)
Anion gap: 13 (ref 5–15)
BUN: 16 mg/dL (ref 8–23)
CO2: 22 mmol/L (ref 22–32)
Calcium: 9 mg/dL (ref 8.9–10.3)
Chloride: 104 mmol/L (ref 98–111)
Creatinine, Ser: 1.19 mg/dL (ref 0.61–1.24)
GFR, Estimated: >60 mL/min (ref >60)
Glucose, Bld: 162 mg/dL — ABNORMAL HIGH (ref 70–99)
Potassium: 3.2 mmol/L — ABNORMAL LOW (ref 3.5–5.1)
Sodium: 139 mmol/L (ref 135–145)
Total Bilirubin: 0.8 mg/dL (ref 0.3–1.2)
Total Protein: 7.4 g/dL (ref 6.5–8.1)

## 2020-07-01 LAB — RESP PANEL BY RT-PCR (FLU A&B, COVID) ARPGX2
Influenza A by PCR: NEGATIVE
Influenza B by PCR: NEGATIVE
SARS Coronavirus 2 by RT PCR: NEGATIVE

## 2020-07-01 LAB — LACTIC ACID, PLASMA
Lactic Acid, Venous: 2.5 mmol/L (ref 0.5–1.9)
Lactic Acid, Venous: 2.5 mmol/L (ref 0.5–1.9)

## 2020-07-01 LAB — LIPASE, BLOOD: Lipase: 32 U/L (ref 11–51)

## 2020-07-01 MED ORDER — IOHEXOL 300 MG/ML  SOLN
100.0000 mL | Freq: Once | INTRAMUSCULAR | Status: AC | PRN
  Administered 2020-07-01: 19:00:00 100 mL via INTRAVENOUS

## 2020-07-01 MED ORDER — LACTATED RINGERS IV BOLUS
1000.0000 mL | Freq: Once | INTRAVENOUS | Status: AC
  Administered 2020-07-01: 20:00:00 1000 mL via INTRAVENOUS

## 2020-07-01 MED ORDER — ONDANSETRON HCL 4 MG PO TABS: 4.0000 mg | ORAL_TABLET | Freq: Four times a day (QID) | ORAL | Status: DC | PRN

## 2020-07-01 MED ORDER — KETOROLAC TROMETHAMINE 15 MG/ML IJ SOLN: 15.0000 mg | Freq: Four times a day (QID) | INTRAMUSCULAR | Status: DC | PRN

## 2020-07-01 MED ORDER — PIPERACILLIN-TAZOBACTAM 3.375 G IVPB
3.3750 g | Freq: Three times a day (TID) | INTRAVENOUS | Status: DC
  Administered 2020-07-02 (×2): 3.375 g via INTRAVENOUS
  Filled 2020-07-01 (×2): qty 50

## 2020-07-01 MED ORDER — KCL-LACTATED RINGERS-D5W 20 MEQ/L IV SOLN
INTRAVENOUS | Status: DC
  Filled 2020-07-01 (×2): qty 1000

## 2020-07-01 MED ORDER — ONDANSETRON HCL 4 MG/2ML IJ SOLN
4.0000 mg | Freq: Four times a day (QID) | INTRAMUSCULAR | Status: DC | PRN
  Administered 2020-07-01: 23:00:00 4 mg via INTRAVENOUS
  Filled 2020-07-01: qty 2

## 2020-07-01 MED ORDER — LACTATED RINGERS IV BOLUS
1000.0000 mL | Freq: Once | INTRAVENOUS | Status: AC
  Administered 2020-07-01: 19:00:00 1000 mL via INTRAVENOUS

## 2020-07-01 MED ORDER — POTASSIUM CHLORIDE 2 MEQ/ML IV SOLN: INTRAVENOUS | Status: DC

## 2020-07-01 MED ORDER — LACTATED RINGERS IV SOLN: INTRAVENOUS | Status: DC

## 2020-07-01 MED ORDER — LACTATED RINGERS IV BOLUS
500.0000 mL | Freq: Once | INTRAVENOUS | Status: AC
  Administered 2020-07-01: 16:00:00 500 mL via INTRAVENOUS

## 2020-07-01 MED ORDER — SODIUM CHLORIDE 0.9 % IV SOLN
2.0000 g | INTRAVENOUS | Status: DC
  Administered 2020-07-01: 2 g via INTRAVENOUS
  Filled 2020-07-01: qty 2

## 2020-07-01 MED ORDER — LACTATED RINGERS IV BOLUS
500.0000 mL | Freq: Once | INTRAVENOUS | Status: AC
  Administered 2020-07-01: 15:00:00 500 mL via INTRAVENOUS

## 2020-07-01 MED ORDER — ONDANSETRON HCL 4 MG/2ML IJ SOLN
4.0000 mg | Freq: Once | INTRAMUSCULAR | Status: AC
  Administered 2020-07-01: 15:00:00 4 mg via INTRAVENOUS
  Filled 2020-07-01: qty 2

## 2020-07-01 MED ORDER — PIPERACILLIN-TAZOBACTAM 3.375 G IVPB 30 MIN
3.3750 g | Freq: Once | INTRAVENOUS | Status: AC
  Administered 2020-07-01: 20:00:00 3.375 g via INTRAVENOUS
  Filled 2020-07-01: qty 50

## 2020-07-01 NOTE — Consult Note (Signed)
Cross cover for [facility] Reason for Consult: Severe diarrhea and abnormal abdominal CT scan. Referring Physician: [facility] hospitalist.  Connor Becker. is an 69 y.o. male.  HPI: [name], Connor Becker. is a 69 year old white male, with multiple medical problems listed below. who presented to the [facility] emergency room with a 5-day history of severe diarrhea with lower abdominal pain and some rectal bleeding that seems to have started in the last couple of hours after multiple loose bowel movements.. Apparently he is having BM's every 30 minutes. He claims at the diarrhea is associated with severe flushing and abdominal cramping and intense nausea.  He ate out at [employer] [place] on Thursday last week when the symptoms started and had some leftovers again on Friday when his symptoms intensified.  He denies having any fever chills or rigors.  His last colonoscopy was done on 07/11/2016 by Dr. Lucio Edward when he was noted to have diverticulosis throughout the colon a small 6 mm polyp was snared.  A CT scan of the abdomen and pelvis with contrast, done on admission revealed severe hepatic steatosis with peripheral branching air consistent with portal venous gas along with a physiologically distended gallbladder without any biliary ductal dilatation; multiple diverticula without diverticulitis were nodes noted along with small amount of air in the main portal vein with no evidence of portal vein thrombosis and minor aortic atherosclerosis.  Prominent periportal nodes were noted there was thought to be reactive; diffuse degenerative disease is noted in the spine with no acute abnormalities.  Laboratory evaluation revealed a white count of 20 4.9K and a glucose of 162.  His LFTs were essentially within normal limits.  He also has a history of reflux and takes Omeprazole on a regular basis.  Past Medical History:  Diagnosis Date  . ALLERGIC RHINITIS   . Chronic pain of both knees   . DJD (degenerative joint  disease) of knee    right  . DM2 (diabetes mellitus, type 2) ([place])   . GERD   . Glaucoma   . History of nuclear stress test    Myoview 12/17: EF 53, no ischemia, Low Risk  . HYPERLIPIDEMIA    myalgias from Lipitor  . HYPOGONADISM, MALE   . Hypothyroidism   . INSOMNIA-SLEEP DISORDER-UNSPEC   . Joint pain   . Obesity   . OSTEOARTHRITIS   . SLEEP APNEA, OBSTRUCTIVE    no CPAP; repeat sleep test pending 07/2016  . Unspecified hypothyroidism 12/04/2013  . Vitamin D deficiency    Past Surgical History:  Procedure Laterality Date  . NASAL SEPTUM SURGERY    . TONSILLECTOMY    . UVULOPALATOPHARYNGOPLASTY    . VASECTOMY     Family History  Problem Relation Age of Onset  . Diabetes Maternal Uncle   . Heart attack Maternal Uncle   . Hypertension Other   . Prostate cancer Other   . Heart disease Other   . Heart attack Mother 25       s/p MI  . Other Mother        pacemaker  . Diabetes Mother   . High blood pressure Mother   . High Cholesterol Mother   . Thyroid disease Mother   . Obesity Mother   . Macular degeneration Father   . Thyroid disease Father   . Colon cancer Neg Hx    Social History:  reports that he has never smoked. He has never used smokeless tobacco. He reports previous alcohol use. He reports that  he does not use drugs.  Allergies:  Allergies  Allergen Reactions  . Horseradish [Armoracia Rusticana Ext (Horseradish)]   . Lipitor [Atorvastatin] Other (See Comments)    Leg cramps  . Metformin And Related Other (See Comments)    GI upset  . Rye Grass Flower Pollen Extract [Gramineae Pollens]   . Soy Allergy    Medications: I have reviewed the patient's current medications.  Results for orders placed or performed during the hospital encounter of 07/01/20 (from the past 48 hour(s))  Lipase, blood     Status: None   Collection Time: 07/01/20  2:33 PM  Result Value Ref Range   Lipase 32 11 - 51 U/L    Comment: Performed at [facility],  [place] [address] [place], [place] [address]  Comprehensive metabolic panel     Status: Abnormal   Collection Time: 07/01/20  2:33 PM  Result Value Ref Range   Sodium 139 135 - 145 mmol/L   Potassium 3.2 (L) 3.5 - 5.1 mmol/L   Chloride 104 98 - 111 mmol/L   CO2 22 22 - 32 mmol/L   Glucose, Bld 162 (H) 70 - 99 mg/dL    Comment: Glucose reference range applies only to samples taken after fasting for at least 8 hours.   BUN 16 8 - 23 mg/dL   Creatinine, Ser 1.19 0.61 - 1.24 mg/dL   Calcium 9.0 8.9 - 10.3 mg/dL   Total Protein 7.4 6.5 - 8.1 g/dL   Albumin 3.7 3.5 - 5.0 g/dL   AST 20 15 - 41 U/L   ALT 26 0 - 44 U/L   Alkaline Phosphatase 70 38 - 126 U/L   Total Bilirubin 0.8 0.3 - 1.2 mg/dL   GFR, Estimated >60 >60 mL/min    Comment: (NOTE) Calculated using the CKD-EPI Creatinine Equation (2021)    Anion gap 13 5 - 15    Comment: Performed at [facility], [place] [address] [place], [place] [address]  CBC     Status: Abnormal   Collection Time: 07/01/20  2:33 PM  Result Value Ref Range   WBC 24.9 (H) 4.0 - 10.5 K/uL   RBC 5.49 4.22 - 5.81 MIL/uL   Hemoglobin 17.0 13.0 - 17.0 g/dL   HCT 50.5 39.0 - 52.0 %   MCV 92.0 80.0 - 100.0 fL   MCH 31.0 26.0 - 34.0 pg   MCHC 33.7 30.0 - 36.0 g/dL   RDW 13.1 11.5 - 15.5 %   Platelets 318 150 - 400 K/uL   nRBC 0.0 0.0 - 0.2 %    Comment: Performed at [facility], [place] [address] [place], [place] [address]  Lactic acid, plasma     Status: Abnormal   Collection Time: 07/01/20  2:39 PM  Result Value Ref Range   Lactic Acid, Venous 2.5 (HH) 0.5 - 1.9 mmol/L    Comment: CRITICAL RESULT CALLED TO, READ BACK BY AND VERIFIED WITH: SIMPSON,C RN @1533  ON 07/01/20 JACKSON,K Performed at [facility], [place] [address], [place], [place] [address]   Resp Panel by RT-PCR (Flu A&B, Covid) Nasopharyngeal Swab     Status: None   Collection Time: 07/01/20  3:06 PM   Specimen: Nasopharyngeal Swab;  Nasopharyngeal(NP) swabs in vial transport medium  Result Value Ref Range   SARS Coronavirus 2 by RT PCR NEGATIVE NEGATIVE    Comment: (NOTE) SARS-CoV-2 target nucleic acids are NOT DETECTED.  The SARS-CoV-2 RNA is generally detectable in  upper respiratory specimens during the acute phase of infection. The lowest concentration of SARS-CoV-2 viral copies this assay can detect is 138 copies/mL. A negative result does not preclude SARS-Cov-2 infection and should not be used as the sole basis for treatment or other patient management decisions. A negative result may occur with  improper specimen collection/handling, submission of specimen other than nasopharyngeal swab, presence of viral mutation(s) within the areas targeted by this assay, and inadequate number of viral copies(<138 copies/mL). A negative result must be combined with clinical observations, patient history, and epidemiological information. The expected result is Negative.  Fact Sheet for Patients:  EntrepreneurPulse.com.au  Fact Sheet for Healthcare Providers:  IncredibleEmployment.be  This test is no t yet approved or cleared by the [place] FDA and  has been authorized for detection and/or diagnosis of SARS-CoV-2 by FDA under an Emergency Use Authorization (EUA). This EUA will remain  in effect (meaning this test can be used) for the duration of the COVID-19 declaration under Section 564(b)(1) of the Act, 21 U.S.C.section 360bbb-3(b)(1), unless the authorization is terminated  or revoked sooner.       Influenza A by PCR NEGATIVE NEGATIVE   Influenza B by PCR NEGATIVE NEGATIVE    Comment: (NOTE) The Xpert Xpress SARS-CoV-2/FLU/RSV plus assay is intended as an aid in the diagnosis of influenza from Nasopharyngeal swab specimens and should not be used as a sole basis for treatment. Nasal washings and aspirates are unacceptable for Xpert Xpress  SARS-CoV-2/FLU/RSV testing.  Fact Sheet for Patients: EntrepreneurPulse.com.au  Fact Sheet for Healthcare Providers: IncredibleEmployment.be  This test is not yet approved or cleared by the [place] FDA and has been authorized for detection and/or diagnosis of SARS-CoV-2 by FDA under an Emergency Use Authorization (EUA). This EUA will remain in effect (meaning this test can be used) for the duration of the COVID-19 declaration under Section 564(b)(1) of the Act, 21 U.S.C. section 360bbb-3(b)(1), unless the authorization is terminated or revoked.  Performed at [facility], [place] [address] [place], [place] [address]   POC occult blood, ED     Status: Abnormal   Collection Time: 07/01/20  3:10 PM  Result Value Ref Range   Fecal Occult Bld POSITIVE (A) NEGATIVE  C Difficile Quick Screen (NO PCR Reflex)     Status: None   Collection Time: 07/01/20  4:06 PM  Result Value Ref Range   C Diff antigen NEGATIVE NEGATIVE   C Diff toxin NEGATIVE NEGATIVE   C Diff interpretation No C. difficile detected.     Comment: Performed at [facility], [place] [address], [place], [place] [address]  Lactic acid, plasma     Status: Abnormal   Collection Time: 07/01/20  5:02 PM  Result Value Ref Range   Lactic Acid, Venous 2.5 (HH) 0.5 - 1.9 mmol/L    Comment: CRITICAL VALUE NOTED.  VALUE IS CONSISTENT WITH PREVIOUSLY REPORTED AND CALLED VALUE. Performed at [facility], [place] [address], [place], [place] [address]    CT Abdomen Pelvis W Contrast  Result Date: 07/01/2020 CLINICAL DATA:  Abdominal pain and distension. Nausea and diarrhea. EXAM: CT ABDOMEN AND PELVIS WITH CONTRAST TECHNIQUE: Multidetector CT imaging of the abdomen and pelvis was performed using the standard protocol following bolus administration of intravenous contrast. CONTRAST:  176mL OMNIPAQUE IOHEXOL 300 MG/ML  SOLN COMPARISON:  None.  FINDINGS: Lower chest: Linear right lower lobe atelectasis or scarring. No pleural fluid. The heart is normal in size. Hepatobiliary: Diffusely  decreased hepatic density consistent with steatosis. Peripheral branching air consistent with portal venous gas. No focal hepatic abnormality. Gallbladder physiologically distended, no calcified stone. No biliary dilatation. Pancreas: No ductal dilatation or inflammation. Spleen: Normal in size without focal abnormality. Adrenals/Urinary Tract: Normal adrenal glands. No hydronephrosis or perinephric edema. Homogeneous renal enhancement with symmetric excretion on delayed phase imaging. Urinary bladder is completely empty. Stomach/Bowel: Fluid/ingested contents distends the stomach. Non dependent air in the stomach, possibly but not definitively in the gastric wall, series 2, image 22. No definite gastric wall thickening, there is no perigastric inflammation. Normal positioning and appearance of the duodenum and ligament of Treitz. Small bowel primarily decompressed. No obstruction or inflammation. Diminutive appendix without appendicitis. Multifocal colonic diverticulosis without diverticulitis. No colonic wall thickening or inflammation. There is liquid stool in the colon. No pericolonic edema. Vascular/Lymphatic: Small amount of air in the main portal vein, series 2, image 24. No portal vein thrombus. Minor aortic atherosclerosis. Major arterial branches are patent. There are small central mesenteric lymph nodes. Prominent periportal nodes, nonspecific but typically reactive. No pelvic adenopathy. Reproductive: Prostate is unremarkable. Other: No free air. No ascites or free fluid. No intra-abdominal abscess. Fat in the inguinal canals. Musculoskeletal: Diffuse degenerative change throughout the spine. There are no acute or suspicious osseous abnormalities. IMPRESSION: 1. Portal venous gas in the main portal vein is well as peripheral intrahepatic branches. Non dependent  air in the stomach, possibly but not definitively in the gastric wall, equivocal for gastric pneumatosis. No definite gastric wall thickening or perigastric inflammation. There are no other findings of bowel ischemia or inflammatory change. Portal venous gas is suggestive of bowel ischemia, however there is no evidence of bowel inflammation or wall thickening to support this. Recommend close clinical follow-up. 2. Liquid stool in the colon, can be seen with diarrheal illness. No colonic inflammation. 3. Colonic diverticulosis without diverticulitis. 4. Hepatic steatosis. Aortic Atherosclerosis (ICD10-I70.0). These results were called by telephone at the time of interpretation on 07/01/2020 at 7:11 pm to provider [facility] , who verbally acknowledged these results. Electronically Signed   By: Keith Rake M.D.   On: 07/01/2020 19:11   Review of Systems  Constitutional: Positive for activity change and diaphoresis.  HENT: Negative.   Eyes: Negative.   Respiratory: Negative.   Cardiovascular: Negative.   Gastrointestinal: Positive for abdominal distention, abdominal pain, blood in stool, diarrhea and nausea. Negative for rectal pain.  Musculoskeletal: Positive for arthralgias and myalgias.  Allergic/Immunologic: Negative.   Neurological: Positive for dizziness, weakness and light-headedness. Negative for seizures, syncope, facial asymmetry, speech difficulty, numbness and headaches.  Hematological: Bruises/bleeds easily.  Psychiatric/Behavioral: Positive for agitation and sleep disturbance. Negative for behavioral problems, confusion, decreased concentration, dysphoric mood, hallucinations and self-injury. The patient is nervous/anxious.    Blood pressure 121/83, pulse (!) 113, temperature 97.6 F (36.4 C), temperature source Oral, resp. rate 14, height 6\' 1"  (1.854 m), weight (!) 137 kg, SpO2 94 %. Physical Exam Constitutional:      General: He is in acute distress.     Appearance: He is  well-developed. He is obese. He is ill-appearing.  HENT:     Head: Normocephalic and atraumatic.     Mouth/Throat:     Pharynx: Oropharynx is clear.  Eyes:     Extraocular Movements: Extraocular movements intact.  Cardiovascular:     Rate and Rhythm: Tachycardia present.     Heart sounds: Normal heart sounds.  Pulmonary:     Effort: Pulmonary effort is normal.  Breath sounds: Normal breath sounds.  Abdominal:     General: Abdomen is protuberant. Bowel sounds are increased. There is distension.     Palpations: Abdomen is soft.     Tenderness: There is abdominal tenderness in the periumbilical area and suprapubic area.  Skin:    General: Skin is warm and dry.  Neurological:     General: No focal deficit present.     Mental Status: He is alert and oriented to person, place, and time.  Psychiatric:        Mood and Affect: Mood normal.        Behavior: Behavior normal.   Assessment/Plan: 1) Severe diarrhea with abnormal CT scan showing portal venous gas of unclear significance with severe leukocytosis-rule out sepsis; agree with broad-spectrum antibiotics for now.  We will treat symptomatically with antiemetics for the nausea and monitor the patient closely while awaiting stool studies to make further recommendations 2) Extensive diverticulosis on CT scan without evidence of diverticulitis. 3) Severe fatty liver/morbid obesity. 4) History of acid reflux on omeprazole at home/ 5) AODM. 6) Insomnia/sleep apnea. 7) Hyperlipidemia. 8) DDD. Connor Becker 07/01/2020, 8:15 PM

## 2020-07-01 NOTE — ED Notes (Signed)
Pt transported to CT ?

## 2020-07-01 NOTE — ED Notes (Signed)
Wife at bedside.

## 2020-07-01 NOTE — Telephone Encounter (Signed)
The pt's wife was advised to take the pt to the [facility] ER because the pt is still having diarrhea and feels very weak.

## 2020-07-01 NOTE — ED Notes (Signed)
Pt able to independently transfer to [facility].

## 2020-07-01 NOTE — ED Triage Notes (Signed)
[place] fire[place]  Per EMS- Pt c/o abdominal pain, diarrhea, nausea, intermittent dizziness since Tuesday.  Pt with emesis en route to ED.  Pt reports being diaphoretic PTA.   128/62 after 1000 mL NaCL + 4 mg zofran.  HR 80 NSR CBG 154 95% RA\  Tested for Covid Tuesday, negative.

## 2020-07-01 NOTE — ED Provider Notes (Signed)
[place] DEPT Provider Note   CSN: [id number] Arrival date & time: 07/01/20  1322     History Chief Complaint  Patient presents with   Abdominal Pain   Diarrhea   Emesis    Connor Becker. is a 69 y.o. male.  HPI Patient reports he has had diarrhea for 6 days.  Patient ports symptoms started on Saturday.  He had copious diarrhea all through the night.  Then he began having vomiting as well.  By Monday, there was improvement and seem to be doing better.  Diarrhea started recurring on Wednesday and Thursday.  He reports he is having multiple soft green stools.  Is also having some loss of continence with stooling.  Today he started vomiting again.  He has been able to eat very little.  He ate half of a potato yesterday.  Not able to tolerate a lot of fluids.  No documented fever.  He reports he is getting cramping abdominal discomfort.  No cough no sputum production.  He is experiencing generalized weakness and some dizziness with position changes.  Patient was seen by his PCP and had Covid testing which was negative.  No stool studies have been done.  Patient has not been on any antibiotics for many months.  No known exposure to C. difficile.  Both he and his wife ate out on Thursday evening at [employer].  She had a brief day of vomiting with a small amount of diarrhea about 4 days after they ate out and about 2 days after her husband symptoms started.  Her symptoms resolved within 24 hours.  Patient is diabetic.  His blood sugars have been stable.  Consistently around the 150 range.  Patient is not immunized for Covid.    Past Medical History:  Diagnosis Date   ALLERGIC RHINITIS    Chronic pain of both knees    DJD (degenerative joint disease) of knee    right   DM2 (diabetes mellitus, type 2) (HCC)    GERD    Glaucoma    History of nuclear stress test    Myoview 12/17: EF 53, no ischemia, Low Risk   HYPERLIPIDEMIA    myalgias from  Lipitor   HYPOGONADISM, MALE    Hypothyroidism    INSOMNIA-SLEEP DISORDER-UNSPEC    Joint pain    Obesity    OSTEOARTHRITIS    SLEEP APNEA, OBSTRUCTIVE    no CPAP; repeat sleep test pending 07/2016   Unspecified hypothyroidism 12/04/2013   Vitamin D deficiency     Patient Active Problem List   Diagnosis Date Noted   Sepsis ([place]) 07/01/2020   Dyslipidemia 04/13/2020   Type 2 diabetes mellitus with hyperglycemia, without long-term current use of insulin ([place]) 04/13/2020   Insulin resistance 07/01/2018   Orthostatic hypotension 05/19/2018   Type 2 diabetes mellitus with hyperglycemia ([place]) 05/19/2018   Vitamin D deficiency disease 05/19/2018   Obstructive sleep apnea 05/19/2018   Glaucoma 05/19/2018   Class 3 severe obesity with serious comorbidity and body mass index (BMI) of 40.0 to 44.9 in adult [facility]) 05/19/2018   Fatigue 02/01/2016   Dizziness 02/01/2016   PSA elevation 11/17/2015   Osteoarthrosis involving lower leg 11/17/2015   Ulnar neuritis 04/14/2015   Acute sinus infection 04/27/2014   Primary hypothyroidism 12/04/2013   Morbid obesity ([place]) 12/04/2013   Right shoulder pain 10/31/2010   Preventative health care 10/28/2010   Diabetes ([place]) 10/26/2010   INSOMNIA-SLEEP DISORDER-UNSPEC 07/11/2007   GERD 07/11/2007  Hypogonadism in male 04/06/2007   Mixed hyperlipidemia 04/06/2007   ERECTILE DYSFUNCTION 04/06/2007   SLEEP APNEA, OBSTRUCTIVE 04/06/2007   Allergic rhinitis 04/06/2007   OSTEOARTHRITIS 04/06/2007    Past Surgical History:  Procedure Laterality Date   NASAL SEPTUM SURGERY     TONSILLECTOMY     UVULOPALATOPHARYNGOPLASTY     VASECTOMY         Family History  Problem Relation Age of Onset   Diabetes Maternal Uncle    Heart attack Maternal Uncle    Hypertension Other    Prostate cancer Other    Heart disease Other    Heart attack Mother 29       s/p MI   Other Mother        pacemaker    Diabetes Mother    High blood pressure Mother    High Cholesterol Mother    Thyroid disease Mother    Obesity Mother    Macular degeneration Father    Thyroid disease Father    Colon cancer Neg Hx     Social History   Tobacco Use   Smoking status: Never Smoker   Smokeless tobacco: Never Used  Vaping Use   Vaping Use: Never used  Substance Use Topics   Alcohol use: Not Currently    Comment: occassional beer   Drug use: No    Home Medications Prior to Admission medications   Medication Sig Start Date End Date Taking? Authorizing Provider  aspirin 325 MG tablet Take 325 mg by mouth daily.    [provider]  Ferrous Sulfate (IRON PO) Take by mouth.    [provider]  ibuprofen (ADVIL,MOTRIN) 200 MG tablet Take 400 mg by mouth 2 (two) times daily as needed for headache or moderate pain.    [provider]  latanoprost (XALATAN) 0.005 % ophthalmic solution 1 drop at bedtime.    [provider]  levothyroxine (SYNTHROID) 112 MCG tablet Take 1 tablet by mouth once daily 04/04/20   Glendale Chard, MD  lisinopril (ZESTRIL) 5 MG tablet Take 1 tablet (5 mg total) by mouth daily. 10/07/19   Glendale Chard, MD  Magnesium 400 MG CAPS Take 400 mg by mouth at bedtime. 03/11/19   Glendale Chard, MD  methocarbamol (ROBAXIN) 750 MG tablet Take 750 mg by mouth 4 (four) times daily. As needed    [provider]  Multiple Vitamins-Minerals (MULTIVITAMIN/EXTRA VITAMIN D3) CHEW Chew by mouth.    [provider]  omeprazole (PRILOSEC) 20 MG capsule Take 20 mg by mouth daily. prn    [provider]  ONETOUCH VERIO test strip USE STRIP TO CHECK BLOOD SUGAR UP TO 4 TIMES PER DAY 06/04/18   Glendale Chard, MD  pseudoephedrine (SUDAFED) 30 MG tablet Take 60 mg by mouth 2 (two) times daily as needed for congestion.    [provider]  rosuvastatin (CRESTOR) 10 MG tablet Take 1 tablet (10 mg total) by mouth at bedtime. 10/07/19  10/06/20  Glendale Chard, MD  Semaglutide (RYBELSUS) 3 MG TABS Take 3 mg by mouth daily with breakfast. 04/13/20   Shamleffer, Melanie Crazier, MD  tadalafil (CIALIS) 5 MG tablet Take 5 mg by mouth daily. 04/21/18   [provider]  testosterone enanthate (DELATESTRYL) 200 MG/ML injection Inject 200 mg into the muscle every 30 (thirty) days. For IM use only    [provider]  timolol (TIMOPTIC) 0.5 % ophthalmic solution INSTILL 1 DROP INTO EACH EYE ONCE DAILY IN THE MORNING 06/11/18  [provider]  Turmeric 500 MG CAPS Take by mouth.    [provider]  vitamin C (ASCORBIC ACID) 500 MG tablet Take 1,000 mg by mouth daily.    [provider]  Vitamin D, Ergocalciferol, (DRISDOL) 1.25 MG (50000 UNIT) CAPS capsule Take one capsule by mouth on Tues/Fridays 10/19/19   Glendale Chard, MD    Allergies    Horseradish Mariea Stable rusticana ext (horseradish)], Lipitor [atorvastatin], Metformin and related, Rye grass flower pollen extract [gramineae pollens], and Soy allergy  Review of Systems   Review of Systems 10 Systems reviewed and negative except as per HPI Physical Exam Updated Vital Signs BP 113/72    Pulse 98    Temp 97.6 F (36.4 C) (Oral)    Resp 14    Ht 6\' 1"  (1.854 m)    Wt (!) 137 kg    SpO2 95%    BMI 39.84 kg/m   Physical Exam Constitutional:      Comments: Alert with clear mental status.  No respiratory distress at rest.  Nontoxic.  HENT:     Mouth/Throat:     Mouth: Mucous membranes are dry.  Eyes:     Extraocular Movements: Extraocular movements intact.     Conjunctiva/sclera: Conjunctivae normal.  Cardiovascular:     Rate and Rhythm: Normal rate and regular rhythm.  Pulmonary:     Effort: Pulmonary effort is normal.     Breath sounds: Normal breath sounds.  Abdominal:     Comments: Central obesity.  Hyperactive bowel sounds.  Abdomen mildly tender diffusely.  Musculoskeletal:        General: No swelling or tenderness. Normal  range of motion.     Cervical back: Neck supple.     Right lower leg: No edema.     Left lower leg: No edema.  Skin:    General: Skin is warm and dry.  Neurological:     General: No focal deficit present.     Mental Status: He is oriented to person, place, and time.     Coordination: Coordination normal.     ED Results / Procedures / Treatments   Labs (all labs ordered are listed, but only abnormal results are displayed) Labs Reviewed  COMPREHENSIVE METABOLIC PANEL - Abnormal; Notable for the following components:      Result Value   Potassium 3.2 (*)    Glucose, Bld 162 (*)    All other components within normal limits  CBC - Abnormal; Notable for the following components:   WBC 24.9 (*)    All other components within normal limits  LACTIC ACID, PLASMA - Abnormal; Notable for the following components:   Lactic Acid, Venous 2.5 (*)    All other components within normal limits  LACTIC ACID, PLASMA - Abnormal; Notable for the following components:   Lactic Acid, Venous 2.5 (*)    All other components within normal limits  POC OCCULT BLOOD, ED - Abnormal; Notable for the following components:   Fecal Occult Bld POSITIVE (*)    All other components within normal limits  RESP PANEL BY RT-PCR (FLU A&B, COVID) ARPGX2  C DIFFICILE (CDIFF) QUICK SCRN (NO PCR REFLEX)  GASTROINTESTINAL PANEL BY PCR, STOOL (REPLACES STOOL CULTURE)  CULTURE, BLOOD (ROUTINE X 2)  CULTURE, BLOOD (ROUTINE X 2)  LIPASE, BLOOD  URINALYSIS, ROUTINE W REFLEX MICROSCOPIC    EKG None  Radiology CT Abdomen Pelvis W Contrast  Result Date: 07/01/2020 CLINICAL DATA:  Abdominal pain and distension. Nausea and diarrhea.  EXAM: CT ABDOMEN AND PELVIS WITH CONTRAST TECHNIQUE: Multidetector CT imaging of the abdomen and pelvis was performed using the standard protocol following bolus administration of intravenous contrast. CONTRAST:  175mL OMNIPAQUE IOHEXOL 300 MG/ML  SOLN COMPARISON:  None. FINDINGS: Lower chest:  Linear right lower lobe atelectasis or scarring. No pleural fluid. The heart is normal in size. Hepatobiliary: Diffusely decreased hepatic density consistent with steatosis. Peripheral branching air consistent with portal venous gas. No focal hepatic abnormality. Gallbladder physiologically distended, no calcified stone. No biliary dilatation. Pancreas: No ductal dilatation or inflammation. Spleen: Normal in size without focal abnormality. Adrenals/Urinary Tract: Normal adrenal glands. No hydronephrosis or perinephric edema. Homogeneous renal enhancement with symmetric excretion on delayed phase imaging. Urinary bladder is completely empty. Stomach/Bowel: Fluid/ingested contents distends the stomach. Non dependent air in the stomach, possibly but not definitively in the gastric wall, series 2, image 22. No definite gastric wall thickening, there is no perigastric inflammation. Normal positioning and appearance of the duodenum and ligament of Treitz. Small bowel primarily decompressed. No obstruction or inflammation. Diminutive appendix without appendicitis. Multifocal colonic diverticulosis without diverticulitis. No colonic wall thickening or inflammation. There is liquid stool in the colon. No pericolonic edema. Vascular/Lymphatic: Small amount of air in the main portal vein, series 2, image 24. No portal vein thrombus. Minor aortic atherosclerosis. Major arterial branches are patent. There are small central mesenteric lymph nodes. Prominent periportal nodes, nonspecific but typically reactive. No pelvic adenopathy. Reproductive: Prostate is unremarkable. Other: No free air. No ascites or free fluid. No intra-abdominal abscess. Fat in the inguinal canals. Musculoskeletal: Diffuse degenerative change throughout the spine. There are no acute or suspicious osseous abnormalities. IMPRESSION: 1. Portal venous gas in the main portal vein is well as peripheral intrahepatic branches. Non dependent air in the stomach,  possibly but not definitively in the gastric wall, equivocal for gastric pneumatosis. No definite gastric wall thickening or perigastric inflammation. There are no other findings of bowel ischemia or inflammatory change. Portal venous gas is suggestive of bowel ischemia, however there is no evidence of bowel inflammation or wall thickening to support this. Recommend close clinical follow-up. 2. Liquid stool in the colon, can be seen with diarrheal illness. No colonic inflammation. 3. Colonic diverticulosis without diverticulitis. 4. Hepatic steatosis. Aortic Atherosclerosis (ICD10-I70.0). These results were called by telephone at the time of interpretation on 07/01/2020 at 7:11 pm to provider [facility] , who verbally acknowledged these results. Electronically Signed   By: Keith Rake M.D.   On: 07/01/2020 19:11    Procedures Procedures (including critical care time) CRITICAL CARE Performed by: Charlesetta Shanks   Total critical care time: 45 minutes  Critical care time was exclusive of separately billable procedures and treating other patients.  Critical care was necessary to treat or prevent imminent or life-threatening deterioration.  Critical care was time spent personally by me on the following activities: development of treatment plan with patient and/or surrogate as well as nursing, discussions with consultants, evaluation of patient's response to treatment, examination of patient, obtaining history from patient or surrogate, ordering and performing treatments and interventions, ordering and review of laboratory studies, ordering and review of radiographic studies, pulse oximetry and re-evaluation of patient's condition. Medications Ordered in ED Medications  piperacillin-tazobactam (ZOSYN) IVPB 3.375 g (3.375 g Intravenous New Bag/Given 07/01/20 1955)  lactated ringers bolus 1,000 mL (has no administration in time range)  lactated ringers infusion (has no administration in time  range)  lactated ringers bolus 500 mL (0 mLs Intravenous Stopped 07/01/20  1557)  ondansetron (ZOFRAN) injection 4 mg (4 mg Intravenous Given 07/01/20 1504)  lactated ringers bolus 500 mL (0 mLs Intravenous Stopped 07/01/20 1632)  iohexol (OMNIPAQUE) 300 MG/ML solution 100 mL (100 mLs Intravenous Contrast Given 07/01/20 1832)  lactated ringers bolus 1,000 mL (1,000 mLs Intravenous New Bag/Given 07/01/20 1909)    ED Course  I have reviewed the triage vital signs and the nursing notes.  Pertinent labs & imaging results that were available during my care of the patient were reviewed by me and considered in my medical decision making (see chart for details).  Clinical Course as of 07/01/20 2018  Fri Jul 01, 2020  1945 Recheck: Blood pressures are stable. Patient's heart rate 90s. Repeat abdominal examination, upper abdomen is nontender. Deep palpation of the right upper quadrant does not elicit significant pain. Patient feels much more discomfort in the lower and suprapubic abdomen. After deeper palpation of the abdomen patient again felt urgency for bowel movement and had to move to bedside commode. Continues to feel generally weak and nauseated. [MP]  1946 Consult  [MP]  1946 Dr. Jonelle Sidle hospitalist for admission [MP]  2012 Consult: Reviewed with Dr. Collene Mares gastroenterology.  At this time, she is in agreement with continuing treatment for sepsis with Zosyn and fluids.  She will consult in the morning.  At this time no specific intervention needed. [MP]    Clinical Course User Index [MP] Charlesetta Shanks, MD   MDM Rules/Calculators/A&P                         Patient presents as outlined above.  He started having copious diarrhea approximately a week ago.  Symptoms improved and have now rebounded.  Patient has become very generally weak.  He has significant leukocytosis.  CT identifies nonspecific finding of portal venous gas.  I have reviewed this with the radiologist and gastroenterology.  At this  time, it seems very low probability that patient has ischemic bowel.  He has really no stomach\epigastric or right upper quadrant tenderness to deep palpation.  Discomfort is reproduced in the lower abdomen and and then precipitates urgency for bowel movement.  Patient's blood pressure remains stable.  Heart rate is high 90s.  He does not have tachypnea or respiratory distress.  Sepsis protocol initiated.  Dr. Collene Mares is in agreement with proceeding with sepsis management and empiric antibiotics.  Patient will be admitted to hospital service with consultation with gastroenterology.  C. difficile is negative.  Remaining GI panel pending.  Final Clinical Impression(s) / ED Diagnoses Final diagnoses:  Sepsis, due to unspecified organism, unspecified whether acute organ dysfunction present [facility])  Diarrhea of presumed infectious origin    Rx / [place] Orders ED Discharge Orders    None       Charlesetta Shanks, MD 07/01/20 2020

## 2020-07-01 NOTE — H&P (Signed)
History and Physical   Connor Becker. XFG:[id number] DOB: 1951/06/27 DOA: 07/01/2020  Referring MD/NP/PA: Dr. Vallery Ridge  PCP: Glendale Chard, MD   Outpatient Specialists: Dr. Lucio Edward GI  Patient coming from: Home  Chief Complaint: Abdominal pain diarrhea and vomiting  HPI: Connor Becker. is a 69 y.o. male with medical history significant of hyperlipidemia, GERD, diabetes, chronic insomnia, obstructive sleep apnea, vitamin D deficiency, who started having diarrhea and abdominal pain for about 6 days.  He had copious diarrhea all through the night in the beginning.  Associated with vomiting.  The stool is green in color no blood.  There was volume loss and literally persisted including occasional incontinence.  Pain was centrally located rated as 6 out of 10.  Denied any fever or chills.  Denied any known exposure although he ate out with the wife 2 days prior to that.  Patient was evaluated in the outpatient setting including COVID-19 which was negative.  Stool studies have been done so far not indicating a course.  C. difficile has been negative today in the ER with a PCR.  CT scan suggested some type of intra-abdominal lesion but no obvious colitis.  Patient meets sepsis criteria and is therefore being admitted with sepsis due to intra-abdominal causes.  GI has been consulted as well..  ED Course: Temperature 97.9 blood pressure 93/62 pulse 130 respirate of 19 oxygen sat 94% on room air.  White count 24.9 potassium 3.2 the rest of the chemistry and CBC within normal.  C. difficile assay is negative.  Lactic acid 2.5.  CT abdomen pelvis shows portal venous gas in the main portal vein no other findings of bowel ischemia possible gastric pneumatosis.  Also diverticulosis but no diverticulitis..  GI consulted and patient being admitted to the hospital for evaluation and treatment.  Review of Systems: As per HPI otherwise 10 point review of systems negative.    Past Medical  History:  Diagnosis Date  . ALLERGIC RHINITIS   . Chronic pain of both knees   . DJD (degenerative joint disease) of knee    right  . DM2 (diabetes mellitus, type 2) ([place])   . GERD   . Glaucoma   . History of nuclear stress test    Myoview 12/17: EF 53, no ischemia, Low Risk  . HYPERLIPIDEMIA    myalgias from Lipitor  . HYPOGONADISM, MALE   . Hypothyroidism   . INSOMNIA-SLEEP DISORDER-UNSPEC   . Joint pain   . Obesity   . OSTEOARTHRITIS   . SLEEP APNEA, OBSTRUCTIVE    no CPAP; repeat sleep test pending 07/2016  . Unspecified hypothyroidism 12/04/2013  . Vitamin D deficiency     Past Surgical History:  Procedure Laterality Date  . NASAL SEPTUM SURGERY    . TONSILLECTOMY    . UVULOPALATOPHARYNGOPLASTY    . VASECTOMY       reports that he has never smoked. He has never used smokeless tobacco. He reports previous alcohol use. He reports that he does not use drugs.  Allergies  Allergen Reactions  . Horseradish [Armoracia Rusticana Ext (Horseradish)]   . Lipitor [Atorvastatin] Other (See Comments)    Leg cramps  . Metformin And Related Other (See Comments)    GI upset  . Rye Grass Flower Pollen Extract [Gramineae Pollens]   . Soy Allergy     Family History  Problem Relation Age of Onset  . Diabetes Maternal Uncle   . Heart attack Maternal Uncle   . Hypertension  Other   . Prostate cancer Other   . Heart disease Other   . Heart attack Mother 72       s/p MI  . Other Mother        pacemaker  . Diabetes Mother   . High blood pressure Mother   . High Cholesterol Mother   . Thyroid disease Mother   . Obesity Mother   . Macular degeneration Father   . Thyroid disease Father   . Colon cancer Neg Hx      Prior to Admission medications   Medication Sig Start Date End Date Taking? Authorizing Provider  aspirin 325 MG tablet Take 325 mg by mouth daily.    [provider]  Ferrous Sulfate (IRON PO) Take by mouth.    [provider]  ibuprofen  (ADVIL,MOTRIN) 200 MG tablet Take 400 mg by mouth 2 (two) times daily as needed for headache or moderate pain.    [provider]  latanoprost (XALATAN) 0.005 % ophthalmic solution 1 drop at bedtime.    [provider]  levothyroxine (SYNTHROID) 112 MCG tablet Take 1 tablet by mouth once daily 04/04/20   Glendale Chard, MD  lisinopril (ZESTRIL) 5 MG tablet Take 1 tablet (5 mg total) by mouth daily. 10/07/19   Glendale Chard, MD  Magnesium 400 MG CAPS Take 400 mg by mouth at bedtime. 03/11/19   Glendale Chard, MD  methocarbamol (ROBAXIN) 750 MG tablet Take 750 mg by mouth 4 (four) times daily. As needed    [provider]  Multiple Vitamins-Minerals (MULTIVITAMIN/EXTRA VITAMIN D3) CHEW Chew by mouth.    [provider]  omeprazole (PRILOSEC) 20 MG capsule Take 20 mg by mouth daily. prn    [provider]  ONETOUCH VERIO test strip USE STRIP TO CHECK BLOOD SUGAR UP TO 4 TIMES PER DAY 06/04/18   Glendale Chard, MD  pseudoephedrine (SUDAFED) 30 MG tablet Take 60 mg by mouth 2 (two) times daily as needed for congestion.    [provider]  rosuvastatin (CRESTOR) 10 MG tablet Take 1 tablet (10 mg total) by mouth at bedtime. 10/07/19 10/06/20  Glendale Chard, MD  Semaglutide (RYBELSUS) 3 MG TABS Take 3 mg by mouth daily with breakfast. 04/13/20   Shamleffer, Melanie Crazier, MD  tadalafil (CIALIS) 5 MG tablet Take 5 mg by mouth daily. 04/21/18   [provider]  testosterone enanthate (DELATESTRYL) 200 MG/ML injection Inject 200 mg into the muscle every 30 (thirty) days. For IM use only    [provider]  timolol (TIMOPTIC) 0.5 % ophthalmic solution INSTILL 1 DROP INTO EACH EYE ONCE DAILY IN THE MORNING 06/11/18   [provider]  Turmeric 500 MG CAPS Take by mouth.    [provider]  vitamin C (ASCORBIC ACID) 500 MG tablet Take 1,000 mg by mouth daily.    [provider]  Vitamin D, Ergocalciferol, (DRISDOL) 1.25  MG (50000 UNIT) CAPS capsule Take one capsule by mouth on Tues/Fridays 10/19/19   Glendale Chard, MD    Physical Exam: Vitals:   07/01/20 1912 07/01/20 1930 07/01/20 2026 07/01/20 2056  BP: 121/83 113/72 113/72 99/84  Pulse: (!) 113 98 95 100  Resp: 14 14 19 18   Temp:   97.9 F (36.6 C) 97.7 F (36.5 C)  TempSrc:   Oral Oral  SpO2: 94% 95% 100% 97%  Weight:      Height:          Constitutional: Acutely ill looking, no  distress Vitals:   07/01/20 1912 07/01/20 1930 07/01/20 2026 07/01/20 2056  BP: 121/83 113/72 113/72 99/84  Pulse: (!) 113 98 95 100  Resp: 14 14 19 18   Temp:   97.9 F (36.6 C) 97.7 F (36.5 C)  TempSrc:   Oral Oral  SpO2: 94% 95% 100% 97%  Weight:      Height:       Eyes: PERRL, lids and conjunctivae normal ENMT: Mucous membranes are dry posterior pharynx clear of any exudate or lesions.Normal dentition.  Neck: normal, supple, no masses, no thyromegaly Respiratory: clear to auscultation bilaterally, no wheezing, no crackles. Normal respiratory effort. No accessory muscle use.  Cardiovascular: Regular rate and rhythm, no murmurs / rubs / gallops. No extremity edema. 2+ pedal pulses. No carotid bruits.  Abdomen: Tympanic to, diffuse tenderness, no masses palpated. No hepatosplenomegaly. Bowel sounds positive.  Musculoskeletal: no clubbing / cyanosis. No joint deformity upper and lower extremities. Good ROM, no contractures. Normal muscle tone.  Skin: no rashes, lesions, ulcers. No induration Neurologic: CN 2-12 grossly intact. Sensation intact, DTR normal. Strength 5/5 in all 4.  Psychiatric: Normal judgment and insight. Alert and oriented x 3. Normal mood.     Labs on Admission: I have personally reviewed following labs and imaging studies  CBC: Recent Labs  Lab 07/01/20 1433  WBC 24.9*  HGB 17.0  HCT 50.5  MCV 92.0  PLT 001   Basic Metabolic Panel: Recent Labs  Lab 07/01/20 1433  NA 139  K 3.2*  CL 104  CO2 22  GLUCOSE 162*  BUN 16   CREATININE 1.19  CALCIUM 9.0   GFR: Estimated Creatinine Clearance: 82.7 mL/min (by C-G formula based on SCr of 1.19 mg/dL). Liver Function Tests: Recent Labs  Lab 07/01/20 1433  AST 20  ALT 26  ALKPHOS 70  BILITOT 0.8  PROT 7.4  ALBUMIN 3.7   Recent Labs  Lab 07/01/20 1433  LIPASE 32   No results for input(s): AMMONIA in the last 168 hours. Coagulation Profile: No results for input(s): INR, PROTIME in the last 168 hours. Cardiac Enzymes: No results for input(s): CKTOTAL, CKMB, CKMBINDEX, TROPONINI in the last 168 hours. BNP (last 3 results) No results for input(s): PROBNP in the last 8760 hours. HbA1C: No results for input(s): HGBA1C in the last 72 hours. CBG: No results for input(s): GLUCAP in the last 168 hours. Lipid Profile: No results for input(s): CHOL, HDL, LDLCALC, TRIG, CHOLHDL, LDLDIRECT in the last 72 hours. Thyroid Function Tests: No results for input(s): TSH, T4TOTAL, FREET4, T3FREE, THYROIDAB in the last 72 hours. Anemia Panel: No results for input(s): VITAMINB12, FOLATE, FERRITIN, TIBC, IRON, RETICCTPCT in the last 72 hours. Urine analysis:    Component Value Date/Time   COLORURINE YELLOW 08/21/2017 [address] 08/21/2017 1437   LABSPEC 1.024 08/21/2017 1437   PHURINE 5.0 08/21/2017 1437   GLUCOSEU NEGATIVE 08/21/2017 1437   GLUCOSEU NEGATIVE 04/14/2015 1717   HGBUR NEGATIVE 08/21/2017 1437   BILIRUBINUR negative 06/20/2020 [place] 08/21/2017 1437   PROTEINUR Positive (A) 06/20/2020 1244   PROTEINUR NEGATIVE 08/21/2017 1437   UROBILINOGEN 0.2 06/20/2020 1244   UROBILINOGEN 0.2 04/14/2015 1717   NITRITE negative 06/20/2020 1244   NITRITE NEGATIVE 08/21/2017 1437   LEUKOCYTESUR Negative 06/20/2020 1244   Sepsis Labs: @LABRCNTIP (procalcitonin:4,lacticidven:4) ) Recent Results (from the past 240 hour(s))  Novel Coronavirus, NAA ([facility])     Status: None   Collection Time: 06/28/20 11:33 AM   Specimen:  Nasopharyngeal(NP) swabs in vial transport medium  Result Value Ref Range Status   SARS-CoV-2, NAA Not Detected Not Detected Final    Comment: This nucleic acid amplification test was developed and its performance characteristics determined by [employer], [employer]. Nucleic acid amplification tests include RT-PCR and TMA. This test has not been FDA cleared or approved. This test has been authorized by FDA under an Emergency Use Authorization (EUA). This test is only authorized for the duration of time the declaration that circumstances exist justifying the authorization of the emergency use of in vitro diagnostic tests for detection of SARS-CoV-2 virus and/or diagnosis of COVID-19 infection under section 564(b)(1) of the Act, 21 U.S.C. 768TLX-7(W) (1), unless the authorization is terminated or revoked sooner. When diagnostic testing is negative, the possibility of a false negative result should be considered in the context of a patient's recent exposures and the presence of clinical signs and symptoms consistent with COVID-19. An individual without symptoms of COVID-19 and who is not shedding SARS-CoV-2 virus wo uld expect to have a negative (not detected) result in this assay.   SARS-COV-2, NAA 2 DAY TAT     Status: None   Collection Time: 06/28/20 11:33 AM  Result Value Ref Range Status   SARS-CoV-2, NAA 2 DAY TAT Performed  Final  Resp Panel by RT-PCR (Flu A&B, Covid) Nasopharyngeal Swab     Status: None   Collection Time: 07/01/20  3:06 PM   Specimen: Nasopharyngeal Swab; Nasopharyngeal(NP) swabs in vial transport medium  Result Value Ref Range Status   SARS Coronavirus 2 by RT PCR NEGATIVE NEGATIVE Final    Comment: (NOTE) SARS-CoV-2 target nucleic acids are NOT DETECTED.  The SARS-CoV-2 RNA is generally detectable in upper respiratory specimens during the acute phase of infection. The lowest concentration of SARS-CoV-2 viral copies this assay can detect is 138 copies/mL. A  negative result does not preclude SARS-Cov-2 infection and should not be used as the sole basis for treatment or other patient management decisions. A negative result may occur with  improper specimen collection/handling, submission of specimen other than nasopharyngeal swab, presence of viral mutation(s) within the areas targeted by this assay, and inadequate number of viral copies(<138 copies/mL). A negative result must be combined with clinical observations, patient history, and epidemiological information. The expected result is Negative.  Fact Sheet for Patients:  EntrepreneurPulse.com.au  Fact Sheet for Healthcare Providers:  IncredibleEmployment.be  This test is no t yet approved or cleared by the [place] FDA and  has been authorized for detection and/or diagnosis of SARS-CoV-2 by FDA under an Emergency Use Authorization (EUA). This EUA will remain  in effect (meaning this test can be used) for the duration of the COVID-19 declaration under Section 564(b)(1) of the Act, 21 U.S.C.section 360bbb-3(b)(1), unless the authorization is terminated  or revoked sooner.       Influenza A by PCR NEGATIVE NEGATIVE Final   Influenza B by PCR NEGATIVE NEGATIVE Final    Comment: (NOTE) The Xpert Xpress SARS-CoV-2/FLU/RSV plus assay is intended as an aid in the diagnosis of influenza from Nasopharyngeal swab specimens and should not be used as a sole basis for treatment. Nasal washings and aspirates are unacceptable for Xpert Xpress SARS-CoV-2/FLU/RSV testing.  Fact Sheet for Patients: EntrepreneurPulse.com.au  Fact Sheet for Healthcare Providers: IncredibleEmployment.be  This test is not yet approved or cleared by the [place] FDA and has been authorized for detection and/or diagnosis of SARS-CoV-2 by FDA under an Emergency Use Authorization (EUA). This EUA will remain  in effect (meaning this test can  be used) for the duration of the COVID-19 declaration under Section 564(b)(1) of the Act, 21 U.S.C. section 360bbb-3(b)(1), unless the authorization is terminated or revoked.  Performed at [facility], [place] [address], [place], [place] [address]   C Difficile Quick Screen (NO PCR Reflex)     Status: None   Collection Time: 07/01/20  4:06 PM  Result Value Ref Range Status   C Diff antigen NEGATIVE NEGATIVE Final   C Diff toxin NEGATIVE NEGATIVE Final   C Diff interpretation No C. difficile detected.  Final    Comment: Performed at [facility], [place] [address], [place], [place] [address]     Radiological Exams on Admission: CT Abdomen Pelvis W Contrast  Result Date: 07/01/2020 CLINICAL DATA:  Abdominal pain and distension. Nausea and diarrhea. EXAM: CT ABDOMEN AND PELVIS WITH CONTRAST TECHNIQUE: Multidetector CT imaging of the abdomen and pelvis was performed using the standard protocol following bolus administration of intravenous contrast. CONTRAST:  118mL OMNIPAQUE IOHEXOL 300 MG/ML  SOLN COMPARISON:  None. FINDINGS: Lower chest: Linear right lower lobe atelectasis or scarring. No pleural fluid. The heart is normal in size. Hepatobiliary: Diffusely decreased hepatic density consistent with steatosis. Peripheral branching air consistent with portal venous gas. No focal hepatic abnormality. Gallbladder physiologically distended, no calcified stone. No biliary dilatation. Pancreas: No ductal dilatation or inflammation. Spleen: Normal in size without focal abnormality. Adrenals/Urinary Tract: Normal adrenal glands. No hydronephrosis or perinephric edema. Homogeneous renal enhancement with symmetric excretion on delayed phase imaging. Urinary bladder is completely empty. Stomach/Bowel: Fluid/ingested contents distends the stomach. Non dependent air in the stomach, possibly but not definitively in the gastric wall, series 2, image 22. No definite gastric wall  thickening, there is no perigastric inflammation. Normal positioning and appearance of the duodenum and ligament of Treitz. Small bowel primarily decompressed. No obstruction or inflammation. Diminutive appendix without appendicitis. Multifocal colonic diverticulosis without diverticulitis. No colonic wall thickening or inflammation. There is liquid stool in the colon. No pericolonic edema. Vascular/Lymphatic: Small amount of air in the main portal vein, series 2, image 24. No portal vein thrombus. Minor aortic atherosclerosis. Major arterial branches are patent. There are small central mesenteric lymph nodes. Prominent periportal nodes, nonspecific but typically reactive. No pelvic adenopathy. Reproductive: Prostate is unremarkable. Other: No free air. No ascites or free fluid. No intra-abdominal abscess. Fat in the inguinal canals. Musculoskeletal: Diffuse degenerative change throughout the spine. There are no acute or suspicious osseous abnormalities. IMPRESSION: 1. Portal venous gas in the main portal vein is well as peripheral intrahepatic branches. Non dependent air in the stomach, possibly but not definitively in the gastric wall, equivocal for gastric pneumatosis. No definite gastric wall thickening or perigastric inflammation. There are no other findings of bowel ischemia or inflammatory change. Portal venous gas is suggestive of bowel ischemia, however there is no evidence of bowel inflammation or wall thickening to support this. Recommend close clinical follow-up. 2. Liquid stool in the colon, can be seen with diarrheal illness. No colonic inflammation. 3. Colonic diverticulosis without diverticulitis. 4. Hepatic steatosis. Aortic Atherosclerosis (ICD10-I70.0). These results were called by telephone at the time of interpretation on 07/01/2020 at 7:11 pm to provider [facility] , who verbally acknowledged these results. Electronically Signed   By: Keith Rake M.D.   On: 07/01/2020 19:11    EKG:  Independently reviewed.  Sinus tachycardia otherwise no acute findings.  Assessment/Plan Principal Problem:   Sepsis ([place]) Active Problems:  GERD   Diabetes ([place])   Obstructive sleep apnea   Class 3 severe obesity with serious comorbidity and body mass index (BMI) of 40.0 to 44.9 in adult [facility])   Dyslipidemia     #1 sepsis due to intra-abdominal causes: Patient will be admitted and will follow the sepsis protocol.  IV fluids and initiate antibiotics.  Blood cultures obtained.  Stool studies.  GI consulted to evaluate.  #2 abdominal pain vomiting and diarrhea: No obvious colitis on CT.  Will follow GI recommendations while continue antibiotic regimen.  #3 GERD: Confirm and resume PPIs  #4 diabetes: Sliding scale insulin.  #5 obstructive sleep apnea: Not on CPAP.  #6 morbid obesity: Continue dietary counseling.   DVT prophylaxis: Lovenox Code Status: Full code Family Communication: No family at bedside Disposition Plan: Home Consults called: Dr. Meriel Pica, GI Admission status: Inpatient  Severity of Illness: The appropriate patient status for this patient is INPATIENT. Inpatient status is judged to be reasonable and necessary in order to provide the required intensity of service to ensure the patient's safety. The patient's presenting symptoms, physical exam findings, and initial radiographic and laboratory data in the context of their chronic comorbidities is felt to place them at high risk for further clinical deterioration. Furthermore, it is not anticipated that the patient will be medically stable for discharge from the hospital within 2 midnights of admission. The following factors support the patient status of inpatient.   " The patient's presenting symptoms include abdominal pain was vomiting diarrhea. " The worrisome physical exam findings include diffuse tenderness. " The initial radiographic and laboratory data are worrisome because of sepsis criteria. " The  chronic co-morbidities include diabetes.   * I certify that at the point of admission it is my clinical judgment that the patient will require inpatient hospital care spanning beyond 2 midnights from the point of admission due to high intensity of service, high risk for further deterioration and high frequency of surveillance required.Barbette Merino MD [facility] Pager [phone]  If 7PM-7AM, please contact night-coverage [email or web address] Password [email or web address]  07/02/2020, 12:19 AM

## 2020-07-02 ENCOUNTER — Encounter (HOSPITAL_COMMUNITY): Payer: Self-pay | Admitting: Internal Medicine

## 2020-07-02 DIAGNOSIS — R197 Diarrhea, unspecified: Secondary | ICD-10-CM

## 2020-07-02 LAB — GASTROINTESTINAL PANEL BY PCR, STOOL (REPLACES STOOL CULTURE)

## 2020-07-02 LAB — URINALYSIS, ROUTINE W REFLEX MICROSCOPIC
Bacteria, UA: NONE SEEN
Glucose, UA: NEGATIVE mg/dL
Hgb urine dipstick: NEGATIVE
Ketones, ur: 5 mg/dL — AB
Leukocytes,Ua: NEGATIVE
Nitrite: NEGATIVE
Protein, ur: 30 mg/dL — AB
Specific Gravity, Urine: 1.02 (ref 1.005–1.030)
pH: 5 (ref 5.0–8.0)

## 2020-07-02 LAB — COMPREHENSIVE METABOLIC PANEL
ALT: 23 U/L (ref 0–44)
AST: 18 U/L (ref 15–41)
Albumin: 3.3 g/dL — ABNORMAL LOW (ref 3.5–5.0)
Alkaline Phosphatase: 65 U/L (ref 38–126)
Anion gap: 14 (ref 5–15)
BUN: 17 mg/dL (ref 8–23)
CO2: 23 mmol/L (ref 22–32)
Calcium: 8.5 mg/dL — ABNORMAL LOW (ref 8.9–10.3)
Chloride: 100 mmol/L (ref 98–111)
Creatinine, Ser: 1.45 mg/dL — ABNORMAL HIGH (ref 0.61–1.24)
GFR, Estimated: 52 mL/min — ABNORMAL LOW (ref >60)
Glucose, Bld: 190 mg/dL — ABNORMAL HIGH (ref 70–99)
Potassium: 3.9 mmol/L (ref 3.5–5.1)
Sodium: 137 mmol/L (ref 135–145)
Total Bilirubin: 0.4 mg/dL (ref 0.3–1.2)
Total Protein: 6.6 g/dL (ref 6.5–8.1)

## 2020-07-02 LAB — CBC
HCT: 54.9 % — ABNORMAL HIGH (ref 39.0–52.0)
Hemoglobin: 18.6 g/dL — ABNORMAL HIGH (ref 13.0–17.0)
MCH: 31 pg (ref 26.0–34.0)
MCHC: 33.9 g/dL (ref 30.0–36.0)
MCV: 91.5 fL (ref 80.0–100.0)
Platelets: 295 10*3/uL (ref 150–400)
RBC: 6 MIL/uL — ABNORMAL HIGH (ref 4.22–5.81)
RDW: 13.2 % (ref 11.5–15.5)
WBC: 17.8 10*3/uL — ABNORMAL HIGH (ref 4.0–10.5)
nRBC: 0 % (ref 0.0–0.2)

## 2020-07-02 LAB — CORTISOL-AM, BLOOD: Cortisol - AM: 24.9 ug/dL — ABNORMAL HIGH (ref 6.7–22.6)

## 2020-07-02 LAB — PROTIME-INR
INR: 1.1 (ref 0.8–1.2)
Prothrombin Time: 14 seconds (ref 11.4–15.2)

## 2020-07-02 LAB — LACTIC ACID, PLASMA: Lactic Acid, Venous: 2.2 mmol/L (ref 0.5–1.9)

## 2020-07-02 LAB — PROCALCITONIN: Procalcitonin: 0.2 ng/mL

## 2020-07-02 MED ORDER — PROMETHAZINE HCL 25 MG/ML IJ SOLN
12.5000 mg | Freq: Four times a day (QID) | INTRAMUSCULAR | Status: DC | PRN
  Administered 2020-07-02 (×2): 12.5 mg via INTRAVENOUS
  Filled 2020-07-02 (×2): qty 1

## 2020-07-02 MED ORDER — LACTATED RINGERS IV SOLN: INTRAVENOUS | Status: AC

## 2020-07-02 MED ORDER — KATE FARMS STANDARD 1.4 PO LIQD
325.0000 mL | Freq: Two times a day (BID) | ORAL | Status: DC
  Administered 2020-07-03 (×2): 325 mL via ORAL
  Filled 2020-07-02 (×4): qty 325

## 2020-07-02 MED ORDER — ENOXAPARIN SODIUM 80 MG/0.8ML ~~LOC~~ SOLN
70.0000 mg | SUBCUTANEOUS | Status: DC
  Administered 2020-07-02 – 2020-07-03 (×2): 70 mg via SUBCUTANEOUS
  Filled 2020-07-02 (×2): qty 0.8

## 2020-07-02 MED ORDER — ADULT MULTIVITAMIN W/MINERALS CH
1.0000 | ORAL_TABLET | Freq: Every day | ORAL | Status: DC
  Administered 2020-07-02 – 2020-07-04 (×3): 1 via ORAL
  Filled 2020-07-02 (×3): qty 1

## 2020-07-02 NOTE — Progress Notes (Signed)
Initial Nutrition Assessment  DOCUMENTATION CODES:   Obesity unspecified  INTERVENTION:  [employer] po BID, each supplement provides 455 kcal and 20 grams of protein  MVI with minerals daily  Advance diet as tolerated   NUTRITION DIAGNOSIS:   Inadequate oral intake related to acute illness,diarrhea,nausea (Norovirus) as evidenced by per patient/family report.    GOAL:   Patient will meet greater than or equal to 90% of their needs    MONITOR:   Labs,I & O's,Diet advancement,Supplement acceptance,PO intake,Weight trends  REASON FOR ASSESSMENT:   Malnutrition Screening Tool    ASSESSMENT:  69 year old male with history significant of HLD, GERD, DM2, insomnia, OSA, vit D deficiency, and obesity presented with 6 days of diarrhea and abdominal pain after eating at [employer] last Thursday. Pt admitted with sepsis secondary to intra-abdominal infection.  RD working remotely.  GI panel positive for Norovirus.   Patient noted doing significantly better today, diarrhea much improved, abdominal pain and nausea resolved with IV hydration and antiemetics.  Diet advanced to full liquids today at 1114. Per flowsheet, he consumed 70% of breakfast and 80% of lunch. Will continue to monitor for diet advancement and will order [employer] supplement as well as daily MVI to aid with meeting needs.   Weight history reviewed, fairly stable over the past year. 07/01/20 (!) 137 kg  06/20/20 (!) 137.6 kg  04/13/20 (!) 136.8 kg  03/31/20 (!) 138.2 kg  01/06/20 (!) 142.9 kg  10/07/19 (!) 136.5 kg  10/07/19 (!) 136.7 kg  06/09/19 (!) 137.5 kg    Medications reviewed and include:Lovenox  LR @ 100 ml/hr  Labs: Glucose 190,162,134, Cr 1.45 (H), WBC 17.8 (H) A1c 7.6 on 06/20/20  NUTRITION - FOCUSED PHYSICAL EXAM:  Unable to complete at this time  Diet Order:   Diet Order            Diet full liquid Room service appropriate? Yes; Fluid consistency: Thin  Diet effective now                  EDUCATION NEEDS:   No education needs have been identified at this time  Skin:  Skin Assessment: Reviewed RN Assessment  Last BM:  12/17 type 7  Height:   Ht Readings from Last 1 Encounters:  07/01/20 6\' 1"  (1.854 m)    Weight:   Wt Readings from Last 1 Encounters:  07/01/20 (!) 137 kg    Ideal Body Weight:  83.6 kg  BMI:  Body mass index is 39.84 kg/m.  Estimated Nutritional Needs:   Kcal:  4196-2229  Protein:  110-125  Fluid:  >/= 2.3 L   Lajuan Lines, RD, LDN Clinical Nutrition After Hours/Weekend Pager # in [place]

## 2020-07-02 NOTE — Progress Notes (Signed)
CROSS COVER [facility] Subjective: Since I last evaluated the patient, he seems to be doing significantly better today.  His diarrhea has improved significantly is only has 4 small-volume loose bowel movements per day.  His abdominal pain and nausea has resolved as well.  GI panel shows norovirus which explains his symptoms. Objective: Vital signs in last 24 hours: Temp:  [97.7 F (36.5 C)-98.4 F (36.9 C)] 98.2 F (36.8 C) (12/18 0949) Pulse Rate:  [83-113] 94 (12/18 0949) Resp:  [12-20] 12 (12/18 0949) BP: (93-131)/(62-111) 108/82 (12/18 0949) SpO2:  [94 %-100 %] 97 % (12/18 0949) Last BM Date: 07/01/20  Intake/Output from previous day: 12/17 0701 - 12/18 0700 In: 3394 [P.O.:480; I.V.:1793; IV Piggyback:1121] Out: 76 [Urine:70; Emesis/NG output:1; Stool:5] Intake/Output this shift: No intake/output data recorded.  General appearance: alert, cooperative, flushed, no distress and morbidly obese Resp: clear to auscultation bilaterally Cardio: regular rate and rhythm, S1, S2 normal, no murmur, click, rub or gallop GI: soft, non-tender; bowel sounds normal; no masses,  no organomegaly Extremities: extremities normal, atraumatic, no cyanosis or edema  Lab Results: Recent Labs    07/01/20 1433 07/02/20 0056  WBC 24.9* 17.8*  HGB 17.0 18.6*  HCT 50.5 54.9*  PLT 318 295   BMET Recent Labs    07/01/20 1433 07/02/20 0056  NA 139 137  K 3.2* 3.9  CL 104 100  CO2 22 23  GLUCOSE 162* 190*  BUN 16 17  CREATININE 1.19 1.45*  CALCIUM 9.0 8.5*   LFT Recent Labs    07/02/20 0056  PROT 6.6  ALBUMIN 3.3*  AST 18  ALT 23  ALKPHOS 65  BILITOT 0.4   PT/INR Recent Labs    07/02/20 0056  LABPROT 14.0  INR 1.1   Hepatitis Panel No results for input(s): HEPBSAG, HCVAB, HEPAIGM, HEPBIGM in the last 72 hours. C-Diff Recent Labs    07/01/20 1606  CDIFFTOX NEGATIVE   No results for input(s): CDIFFPCR in the last 72 hours. Fecal Lactopherrin No results for input(s):  FECLLACTOFRN in the last 72 hours.  Studies/Results: CT Abdomen Pelvis W Contrast  Result Date: 07/01/2020 CLINICAL DATA:  Abdominal pain and distension. Nausea and diarrhea. EXAM: CT ABDOMEN AND PELVIS WITH CONTRAST TECHNIQUE: Multidetector CT imaging of the abdomen and pelvis was performed using the standard protocol following bolus administration of intravenous contrast. CONTRAST:  189mL OMNIPAQUE IOHEXOL 300 MG/ML  SOLN COMPARISON:  None. FINDINGS: Lower chest: Linear right lower lobe atelectasis or scarring. No pleural fluid. The heart is normal in size. Hepatobiliary: Diffusely decreased hepatic density consistent with steatosis. Peripheral branching air consistent with portal venous gas. No focal hepatic abnormality. Gallbladder physiologically distended, no calcified stone. No biliary dilatation. Pancreas: No ductal dilatation or inflammation. Spleen: Normal in size without focal abnormality. Adrenals/Urinary Tract: Normal adrenal glands. No hydronephrosis or perinephric edema. Homogeneous renal enhancement with symmetric excretion on delayed phase imaging. Urinary bladder is completely empty. Stomach/Bowel: Fluid/ingested contents distends the stomach. Non dependent air in the stomach, possibly but not definitively in the gastric wall, series 2, image 22. No definite gastric wall thickening, there is no perigastric inflammation. Normal positioning and appearance of the duodenum and ligament of Treitz. Small bowel primarily decompressed. No obstruction or inflammation. Diminutive appendix without appendicitis. Multifocal colonic diverticulosis without diverticulitis. No colonic wall thickening or inflammation. There is liquid stool in the colon. No pericolonic edema. Vascular/Lymphatic: Small amount of air in the main portal vein, series 2, image 24. No portal vein thrombus. Minor aortic atherosclerosis. Major  arterial branches are patent. There are small central mesenteric lymph nodes. Prominent  periportal nodes, nonspecific but typically reactive. No pelvic adenopathy. Reproductive: Prostate is unremarkable. Other: No free air. No ascites or free fluid. No intra-abdominal abscess. Fat in the inguinal canals. Musculoskeletal: Diffuse degenerative change throughout the spine. There are no acute or suspicious osseous abnormalities. IMPRESSION: 1. Portal venous gas in the main portal vein is well as peripheral intrahepatic branches. Non dependent air in the stomach, possibly but not definitively in the gastric wall, equivocal for gastric pneumatosis. No definite gastric wall thickening or perigastric inflammation. There are no other findings of bowel ischemia or inflammatory change. Portal venous gas is suggestive of bowel ischemia, however there is no evidence of bowel inflammation or wall thickening to support this. Recommend close clinical follow-up. 2. Liquid stool in the colon, can be seen with diarrheal illness. No colonic inflammation. 3. Colonic diverticulosis without diverticulitis. 4. Hepatic steatosis. Aortic Atherosclerosis (ICD10-I70.0). These results were called by telephone at the time of interpretation on 07/01/2020 at 7:11 pm to provider [facility] , who verbally acknowledged these results. Electronically Signed   By: Keith Rake M.D.   On: 07/01/2020 19:11    Medications: I have reviewed the patient's current medications.  Assessment/Plan: 1) Severe diarrhea with nausea and abdominal pain secondary to norovirus significantly improved with IV hydration and antiemetics.  I think would be appropriate to discontinue all antibiotics now. 2) Abnormal CT scan showing portal venous gas of unclear significance-patient will need to follow with up with his gastroenterologist. 3) Extensive diverticulosis on CT scan without evidence of diverticulitis. 4) Severe fatty liver/morbid obesity. 5) History of acid reflux on omeprazole at home/ 6) AODM. 7) Insomnia/sleep apnea. 8)  Hyperlipidemia. 9) DDD.  LOS: 1 day   Juanita Craver 07/02/2020, 4:20 PM

## 2020-07-02 NOTE — Plan of Care (Signed)
  Problem: Fluid Volume: Goal: Hemodynamic stability will improve Outcome: Progressing   Problem: Clinical Measurements: Goal: Diagnostic test results will improve Outcome: Progressing   Problem: Education: Goal: Knowledge of General Education information will improve Description: Including pain rating scale, medication(s)/side effects and non-pharmacologic comfort measures Outcome: Progressing

## 2020-07-02 NOTE — Progress Notes (Signed)
Lab called results for GI panel. Showing positive for Norovirus. Md notified and orders obtained.Eulas Post, RN

## 2020-07-02 NOTE — Progress Notes (Signed)
PHARMACY CONSULT: Lovenox for VTE prophylaxis   Wt: 137 kg BMI:  39 kg Scr:  1.45 CrCl >30 ml/hr  H/H: Hgb 18.6/54.9 Pltc: 295  A/P:  Due to morbid obesity, begin weight-adjusted lovenox 0.5mg /kg sq q24h  Monitor CBC and renal function, adjust as needed   Peggyann Juba, PharmD, BCPS 07/02/2020 3:32 PM Pharmacy: [phone]

## 2020-07-02 NOTE — Progress Notes (Signed)
Critical result from lab, lactic acid 2.2 sepsis team RN notified as well as PCP

## 2020-07-02 NOTE — Progress Notes (Signed)
PROGRESS NOTE    Connor Becker.  DGU:[id number] DOB: [birth year] DOA: 07/01/2020 PCP: Glendale Chard, MD   Brief Narrative: Taken from H&P. Connor Becker. is a 69 y.o. male with medical history significant of hyperlipidemia, GERD, diabetes, chronic insomnia, obstructive sleep apnea, vitamin D deficiency, who started having diarrhea and abdominal pain for about 6 days.  Symptoms started after eating outside at [employer] last Thursday. C. difficile negative.  GI pathogen pending.  Had leukocytosis. CT scan of the abdomen and pelvis with contrast, done on admission revealed severe hepatic steatosis with peripheral branching air consistent with portal venous gas along with a physiologically distended gallbladder without any biliary ductal dilatation; multiple diverticula without diverticulitis were nodes noted along with small amount of air in the main portal vein with no evidence of portal vein thrombosis and minor aortic atherosclerosis.  Prominent periportal nodes were noted there was thought to be reactive; diffuse degenerative disease is noted in the spine with no acute abnormalities. GI was also consulted.  Received ceftriaxone and Zosyn in ED.  Subjective: Patient continued to feel some nausea, stating that no vomiting since he took the medicine.  Continue to have diarrhea every hour.  Abdominal pain seems improving.  Assessment & Plan:   Principal Problem:   Sepsis ([place]) Active Problems:   GERD   Diabetes ([place])   Obstructive sleep apnea   Class 3 severe obesity with serious comorbidity and body mass index (BMI) of 40.0 to 44.9 in adult [name])   Dyslipidemia  Sepsis secondary to intra-abdominal infection.  Initially met sepsis criteria.  C. difficile negative.  GI pathogen pending.  Procalcitonin at 0.2, lactic acid 2.5>>2.2.  FOBT positive, hemoglobin remained stable.  Appears dry. -Discontinue ceftriaxone. -Continue with Zosyn for now till we have GI pathogen  available. -GI was consulted-appreciate their recommendations. -Continue with IV fluid. -Advance diet to full liquid-can advance to normal once start tolerating. -Continue with supportive care.  AKI.  Patient appears dry, creatinine at 1.45. -Give him some gentle fluid. -Avoid nephrotoxins -Continue to monitor  Type 2 diabetes mellitus.  CBG at 145. -Continue with sliding scale  GERD. -Continue PPI  Obstructive sleep apnea.  Not on CPAP.  Morbid obesity. Body mass index is 39.84 kg/m.  Objective: Vitals:   07/01/20 2026 07/01/20 2056 07/02/20 0232 07/02/20 0550  BP: 113/72 99/84 (!) 131/111 97/71  Pulse: 95 100 (!) 106 100  Resp: '19 18 18 20  ' Temp: 97.9 F (36.6 C) 97.7 F (36.5 C) 98.2 F (36.8 C) 98.4 F (36.9 C)  TempSrc: Oral Oral Oral Oral  SpO2: 100% 97% 97% 96%  Weight:      Height:        Intake/Output Summary (Last 24 hours) at 07/02/2020 0804 Last data filed at 07/02/2020 0600 Gross per 24 hour  Intake 3393.99 ml  Output 76 ml  Net 3317.99 ml   Filed Weights   07/01/20 1346  Weight: (!) 137 kg    Examination:  General exam: Morbidly obese gentleman, appears calm and comfortable  Respiratory system: Clear to auscultation. Respiratory effort normal. Cardiovascular system: S1 & S2 heard, RRR.  Gastrointestinal system: Soft, nontender, nondistended, bowel sounds positive. Central nervous system: Alert and oriented. No focal neurological deficits.Symmetric 5 x 5 power. Extremities: No edema, no cyanosis, pulses intact and symmetrical. Psychiatry: Judgement and insight appear normal. Mood & affect appropriate.    DVT prophylaxis: Lovenox Code Status: Full Family Communication: Discussed with patient Disposition Plan:  Status is: Inpatient  Remains inpatient appropriate because:Inpatient level of care appropriate due to severity of illness   Dispo: The patient is from: Home              Anticipated d/c is to: Home              Anticipated  d/c date is: 1 day              Patient currently is not medically stable to d/c.   Consultants:   GI  Procedures:  Antimicrobials:  Zosyn  Data Reviewed: I have personally reviewed following labs and imaging studies  CBC: Recent Labs  Lab 07/01/20 1433 07/02/20 0056  WBC 24.9* 17.8*  HGB 17.0 18.6*  HCT 50.5 54.9*  MCV 92.0 91.5  PLT 318 694   Basic Metabolic Panel: Recent Labs  Lab 07/01/20 1433 07/02/20 0056  NA 139 137  K 3.2* 3.9  CL 104 100  CO2 22 23  GLUCOSE 162* 190*  BUN 16 17  CREATININE 1.19 1.45*  CALCIUM 9.0 8.5*   GFR: Estimated Creatinine Clearance: 67.9 mL/min (A) (by C-G formula based on SCr of 1.45 mg/dL (H)). Liver Function Tests: Recent Labs  Lab 07/01/20 1433 07/02/20 0056  AST 20 18  ALT 26 23  ALKPHOS 70 65  BILITOT 0.8 0.4  PROT 7.4 6.6  ALBUMIN 3.7 3.3*   Recent Labs  Lab 07/01/20 1433  LIPASE 32   No results for input(s): AMMONIA in the last 168 hours. Coagulation Profile: Recent Labs  Lab 07/02/20 0056  INR 1.1   Cardiac Enzymes: No results for input(s): CKTOTAL, CKMB, CKMBINDEX, TROPONINI in the last 168 hours. BNP (last 3 results) No results for input(s): PROBNP in the last 8760 hours. HbA1C: No results for input(s): HGBA1C in the last 72 hours. CBG: No results for input(s): GLUCAP in the last 168 hours. Lipid Profile: No results for input(s): CHOL, HDL, LDLCALC, TRIG, CHOLHDL, LDLDIRECT in the last 72 hours. Thyroid Function Tests: No results for input(s): TSH, T4TOTAL, FREET4, T3FREE, THYROIDAB in the last 72 hours. Anemia Panel: No results for input(s): VITAMINB12, FOLATE, FERRITIN, TIBC, IRON, RETICCTPCT in the last 72 hours. Sepsis Labs: Recent Labs  Lab 07/01/20 1439 07/01/20 1702 07/02/20 0056  PROCALCITON  --   --  0.20  LATICACIDVEN 2.5* 2.5* 2.2*    Recent Results (from the past 240 hour(s))  Novel Coronavirus, NAA ([facility])     Status: None   Collection Time: 06/28/20 11:33 AM    Specimen: Nasopharyngeal(NP) swabs in vial transport medium  Result Value Ref Range Status   SARS-CoV-2, NAA Not Detected Not Detected Final    Comment: This nucleic acid amplification test was developed and its performance characteristics determined by [employer]. Nucleic acid amplification tests include RT-PCR and TMA. This test has not been FDA cleared or approved. This test has been authorized by FDA under an Emergency Use Authorization (EUA). This test is only authorized for the duration of time the declaration that circumstances exist justifying the authorization of the emergency use of in vitro diagnostic tests for detection of SARS-CoV-2 virus and/or diagnosis of COVID-19 infection under section 564(b)(1) of the Act, 21 U.S.C. 503UUE-2(C) (1), unless the authorization is terminated or revoked sooner. When diagnostic testing is negative, the possibility of a false negative result should be considered in the context of a patient's recent exposures and the presence of clinical signs and symptoms consistent with COVID-19. An individual without symptoms of COVID-19 and who is not shedding SARS-CoV-2  virus wo uld expect to have a negative (not detected) result in this assay.   SARS-COV-2, NAA 2 DAY TAT     Status: None   Collection Time: 06/28/20 11:33 AM  Result Value Ref Range Status   SARS-CoV-2, NAA 2 DAY TAT Performed  Final  Resp Panel by RT-PCR (Flu A&B, Covid) Nasopharyngeal Swab     Status: None   Collection Time: 07/01/20  3:06 PM   Specimen: Nasopharyngeal Swab; Nasopharyngeal(NP) swabs in vial transport medium  Result Value Ref Range Status   SARS Coronavirus 2 by RT PCR NEGATIVE NEGATIVE Final    Comment: (NOTE) SARS-CoV-2 target nucleic acids are NOT DETECTED.  The SARS-CoV-2 RNA is generally detectable in upper respiratory specimens during the acute phase of infection. The lowest concentration of SARS-CoV-2 viral copies this assay can detect is 138  copies/mL. A negative result does not preclude SARS-Cov-2 infection and should not be used as the sole basis for treatment or other patient management decisions. A negative result may occur with  improper specimen collection/handling, submission of specimen other than nasopharyngeal swab, presence of viral mutation(s) within the areas targeted by this assay, and inadequate number of viral copies(<138 copies/mL). A negative result must be combined with clinical observations, patient history, and epidemiological information. The expected result is Negative.  Fact Sheet for Patients:  EntrepreneurPulse.com.au  Fact Sheet for Healthcare Providers:  IncredibleEmployment.be  This test is no t yet approved or cleared by the [place] FDA and  has been authorized for detection and/or diagnosis of SARS-CoV-2 by FDA under an Emergency Use Authorization (EUA). This EUA will remain  in effect (meaning this test can be used) for the duration of the COVID-19 declaration under Section 564(b)(1) of the Act, 21 U.S.C.section 360bbb-3(b)(1), unless the authorization is terminated  or revoked sooner.       Influenza A by PCR NEGATIVE NEGATIVE Final   Influenza B by PCR NEGATIVE NEGATIVE Final    Comment: (NOTE) The Xpert Xpress SARS-CoV-2/FLU/RSV plus assay is intended as an aid in the diagnosis of influenza from Nasopharyngeal swab specimens and should not be used as a sole basis for treatment. Nasal washings and aspirates are unacceptable for Xpert Xpress SARS-CoV-2/FLU/RSV testing.  Fact Sheet for Patients: EntrepreneurPulse.com.au  Fact Sheet for Healthcare Providers: IncredibleEmployment.be  This test is not yet approved or cleared by the [place] FDA and has been authorized for detection and/or diagnosis of SARS-CoV-2 by FDA under an Emergency Use Authorization (EUA). This EUA will remain in effect (meaning  this test can be used) for the duration of the COVID-19 declaration under Section 564(b)(1) of the Act, 21 U.S.C. section 360bbb-3(b)(1), unless the authorization is terminated or revoked.  Performed at [facility], [place] [address], [place], [place] [address]   C Difficile Quick Screen (NO PCR Reflex)     Status: None   Collection Time: 07/01/20  4:06 PM  Result Value Ref Range Status   C Diff antigen NEGATIVE NEGATIVE Final   C Diff toxin NEGATIVE NEGATIVE Final   C Diff interpretation No C. difficile detected.  Final    Comment: Performed at [facility], [place] [address] [place], [place] [address]     Radiology Studies: CT Abdomen Pelvis W Contrast  Result Date: 07/01/2020 CLINICAL DATA:  Abdominal pain and distension. Nausea and diarrhea. EXAM: CT ABDOMEN AND PELVIS WITH CONTRAST TECHNIQUE: Multidetector CT imaging of the abdomen and pelvis was performed using the standard protocol following bolus administration of  intravenous contrast. CONTRAST:  110m OMNIPAQUE IOHEXOL 300 MG/ML  SOLN COMPARISON:  None. FINDINGS: Lower chest: Linear right lower lobe atelectasis or scarring. No pleural fluid. The heart is normal in size. Hepatobiliary: Diffusely decreased hepatic density consistent with steatosis. Peripheral branching air consistent with portal venous gas. No focal hepatic abnormality. Gallbladder physiologically distended, no calcified stone. No biliary dilatation. Pancreas: No ductal dilatation or inflammation. Spleen: Normal in size without focal abnormality. Adrenals/Urinary Tract: Normal adrenal glands. No hydronephrosis or perinephric edema. Homogeneous renal enhancement with symmetric excretion on delayed phase imaging. Urinary bladder is completely empty. Stomach/Bowel: Fluid/ingested contents distends the stomach. Non dependent air in the stomach, possibly but not definitively in the gastric wall, series 2, image 22. No definite gastric wall  thickening, there is no perigastric inflammation. Normal positioning and appearance of the duodenum and ligament of Treitz. Small bowel primarily decompressed. No obstruction or inflammation. Diminutive appendix without appendicitis. Multifocal colonic diverticulosis without diverticulitis. No colonic wall thickening or inflammation. There is liquid stool in the colon. No pericolonic edema. Vascular/Lymphatic: Small amount of air in the main portal vein, series 2, image 24. No portal vein thrombus. Minor aortic atherosclerosis. Major arterial branches are patent. There are small central mesenteric lymph nodes. Prominent periportal nodes, nonspecific but typically reactive. No pelvic adenopathy. Reproductive: Prostate is unremarkable. Other: No free air. No ascites or free fluid. No intra-abdominal abscess. Fat in the inguinal canals. Musculoskeletal: Diffuse degenerative change throughout the spine. There are no acute or suspicious osseous abnormalities. IMPRESSION: 1. Portal venous gas in the main portal vein is well as peripheral intrahepatic branches. Non dependent air in the stomach, possibly but not definitively in the gastric wall, equivocal for gastric pneumatosis. No definite gastric wall thickening or perigastric inflammation. There are no other findings of bowel ischemia or inflammatory change. Portal venous gas is suggestive of bowel ischemia, however there is no evidence of bowel inflammation or wall thickening to support this. Recommend close clinical follow-up. 2. Liquid stool in the colon, can be seen with diarrheal illness. No colonic inflammation. 3. Colonic diverticulosis without diverticulitis. 4. Hepatic steatosis. Aortic Atherosclerosis (ICD10-I70.0). These results were called by telephone at the time of interpretation on 07/01/2020 at 7:11 pm [facility], who verbally acknowledged these results. Electronically Signed   By: MKeith RakeM.D.   On: 07/01/2020 19:11     Scheduled Meds: Continuous Infusions: . lactated ringers 150 mL/hr at 07/01/20 2303  . piperacillin-tazobactam (ZOSYN)  IV 3.375 g (07/02/20 0408)     LOS: 1 day   Time spent: 35 minutes.  SLorella Nimrod MD [facility]  If 7PM-7AM, please contact night-coverage [email or web address]  07/02/2020, 8:04 AM   This record has been created using [employer] analyst Errors have been sought and corrected,but may not always be located. Such creation errors do not reflect on the standard of care.

## 2020-07-02 NOTE — Progress Notes (Signed)
Elink Code Sepsis completion note:  Pt received ABX and IVF replacement. Messaged ED RN earlier about a repeat LA since the first two were 2.5. Last LA down to 2.2, remain over 2.0 but being admitted to telemetry for further monitoring.   Angila Wombles DNP eLink RN

## 2020-07-02 NOTE — Progress Notes (Signed)
Code Sepsis initiated @ 1931 PM, [place] following.

## 2020-07-03 LAB — BASIC METABOLIC PANEL
Anion gap: 12 (ref 5–15)
BUN: 16 mg/dL (ref 8–23)
CO2: 24 mmol/L (ref 22–32)
Calcium: 8.3 mg/dL — ABNORMAL LOW (ref 8.9–10.3)
Chloride: 101 mmol/L (ref 98–111)
Creatinine, Ser: 1.45 mg/dL — ABNORMAL HIGH (ref 0.61–1.24)
GFR, Estimated: 52 mL/min — ABNORMAL LOW (ref >60)
Glucose, Bld: 106 mg/dL — ABNORMAL HIGH (ref 70–99)
Potassium: 3.4 mmol/L — ABNORMAL LOW (ref 3.5–5.1)
Sodium: 137 mmol/L (ref 135–145)

## 2020-07-03 LAB — CBC
HCT: 46.3 % (ref 39.0–52.0)
Hemoglobin: 15.4 g/dL (ref 13.0–17.0)
MCH: 30.7 pg (ref 26.0–34.0)
MCHC: 33.3 g/dL (ref 30.0–36.0)
MCV: 92.4 fL (ref 80.0–100.0)
Platelets: 239 10*3/uL (ref 150–400)
RBC: 5.01 MIL/uL (ref 4.22–5.81)
RDW: 13.5 % (ref 11.5–15.5)
WBC: 14.2 10*3/uL — ABNORMAL HIGH (ref 4.0–10.5)
nRBC: 0 % (ref 0.0–0.2)

## 2020-07-03 LAB — MAGNESIUM: Magnesium: 1.5 mg/dL — ABNORMAL LOW (ref 1.7–2.4)

## 2020-07-03 LAB — GLUCOSE, CAPILLARY
Glucose-Capillary: 116 mg/dL — ABNORMAL HIGH (ref 70–99)
Glucose-Capillary: 164 mg/dL — ABNORMAL HIGH (ref 70–99)

## 2020-07-03 MED ORDER — LACTATED RINGERS IV SOLN: INTRAVENOUS | Status: DC

## 2020-07-03 MED ORDER — TIMOLOL MALEATE 0.5 % OP SOLN
1.0000 [drp] | Freq: Every day | OPHTHALMIC | Status: DC
  Administered 2020-07-03 – 2020-07-04 (×2): 1 [drp] via OPHTHALMIC
  Filled 2020-07-03: qty 5

## 2020-07-03 MED ORDER — MAGNESIUM OXIDE 400 (241.3 MG) MG PO TABS
400.0000 mg | ORAL_TABLET | Freq: Every day | ORAL | Status: DC
Start: 2020-07-03 — End: 2020-07-04
  Administered 2020-07-03: 22:00:00 400 mg via ORAL
  Filled 2020-07-03: qty 1

## 2020-07-03 MED ORDER — MULTIVITAMIN/EXTRA VITAMIN D3 PO CHEW: 1.0000 | CHEWABLE_TABLET | Freq: Every day | ORAL | Status: DC

## 2020-07-03 MED ORDER — LEVOTHYROXINE SODIUM 112 MCG PO TABS
112.0000 ug | ORAL_TABLET | Freq: Every day | ORAL | Status: DC
  Administered 2020-07-04: 06:00:00 112 ug via ORAL
  Filled 2020-07-03: qty 1

## 2020-07-03 MED ORDER — LOPERAMIDE HCL 2 MG PO CAPS: 2.0000 mg | ORAL_CAPSULE | ORAL | Status: DC | PRN

## 2020-07-03 MED ORDER — PANTOPRAZOLE SODIUM 40 MG PO TBEC
40.0000 mg | DELAYED_RELEASE_TABLET | Freq: Every day | ORAL | Status: DC
  Administered 2020-07-03 – 2020-07-04 (×2): 40 mg via ORAL
  Filled 2020-07-03 (×2): qty 1

## 2020-07-03 MED ORDER — POTASSIUM CHLORIDE CRYS ER 20 MEQ PO TBCR
40.0000 meq | EXTENDED_RELEASE_TABLET | Freq: Two times a day (BID) | ORAL | Status: AC
  Administered 2020-07-03 (×2): 40 meq via ORAL
  Filled 2020-07-03 (×2): qty 2

## 2020-07-03 MED ORDER — MAGNESIUM SULFATE 4 GM/100ML IV SOLN
4.0000 g | Freq: Once | INTRAVENOUS | Status: AC
  Administered 2020-07-03: 12:00:00 4 g via INTRAVENOUS
  Filled 2020-07-03: qty 100

## 2020-07-03 NOTE — Progress Notes (Signed)
PROGRESS NOTE  Connor Becker. EGB:[id number] DOB: 1951/03/16 DOA: 07/01/2020 PCP: Glendale Chard, MD  HPI/Recap of past 24 hours: Connor Beckeris a 69 y.o.malewith medical history significant ofhyperlipidemia, GERD, diabetes, chronic insomnia, obstructive sleep apnea, vitamin D deficiency, who started having diarrhea and abdominal pain for about 6 days.  Symptoms started after eating outside at [employer] last Thursday. C. difficile negative.  GI pathogen pending.  Had leukocytosis. CT scan of the abdomen and pelvis with contrast,done on admission revealed severe hepatic steatosis with peripheral branching air consistent with portal venous gas along with a physiologically distended gallbladder without any biliary ductal dilatation;multiple diverticula without diverticulitis were nodes noted along with small amount of air in the main portal vein with no evidence of portal vein thrombosis and minor aortic atherosclerosis. Prominent periportal nodes were noted there was thought to be reactive;diffuse degenerative disease is noted in the spine with no acute abnormalities. GI was also consulted.  Received ceftriaxone and Zosyn in ED.  07/03/20: Self reports persistent diarrhea this morning.  GI panel positive for norovirus.  C. difficile negative.  Encouraged to increase free oral fluid intake.  Receptive.  We will continue IV fluids to avoid dehydration.  Reports decrease in urine output.  Will obtain a bladder scan and treat as indicated.  assessment/Plan: Principal Problem:   Sepsis ([place]) Active Problems:   GERD   Diabetes ([place])   Obstructive sleep apnea   Class 3 severe obesity with serious comorbidity and body mass index (BMI) of 40.0 to 44.9 in adult [facility])   Dyslipidemia   Diarrhea of presumed infectious origin  Sepsis secondary to intra-abdominal infection, norovirus gastroenteritis.  Initially met sepsis criteria.  C. difficile negative.  GI panel positive for  norovirus.  Procalcitonin at 0.2, lactic acid 2.5>>2.2.  FOBT positive, hemoglobin remained stable.   DC IV antibiotics.  Received Rocephin and IV Zosyn Continue IV fluid Diet switch to heart healthy carb modified diet from full liquid on 07/03/2020.  Norovirus gastroenteritis with acute diarrhea Diarrhea less than 2 weeks GI panel positive for norovirus on 07/01/2020 Seen by GI Now off antibiotics Avoid dehydration, start Imodium as needed  AKI, likely prerenal in the setting of dehydration from GI losses, diarrhea.   Baseline creatinine appears to be 1.1 with GFR greater than 60 Currently creatinine 1.45 with GFR of 52 Avoid nephrotoxins, hypotension and dehydration Continue IV fluids Monitor urine output Repeat renal panel in the morning.  Suspected acute urinary retention Will obtain a bladder scan on 07/03/2020, follow results Continue IV fluid Monitor urine output  Electrolytes derangement Hypokalemia, serum potassium 3.4 Repleted orally, 40 mEq x 2 doses. Hypomagnesemia with serum magnesium 1.5 Repleted intravenously 4 g IV once.  Type 2 diabetes mellitus with hyperglycemia. Hemoglobin A1c 7.6 on 06/20/2020. -Continue with sliding scale  GERD. -Continue PPI  Obstructive sleep apnea.  Not on CPAP.      Code Status: Full code.  Family Communication: None at bedside  Disposition Plan: Likely will DC to home on 07/04/2020.   Consultants:  GI.  Procedures:  None.  Antimicrobials:  None.  DVT prophylaxis: Subcu Lovenox daily  Status is: Inpatient   Dispo:  Patient From: Home  Planned Disposition: Home  Expected discharge date: 07/04/2020.  Medically stable for discharge: Yes         Objective: Vitals:   07/02/20 1638 07/02/20 2049 07/03/20 0623 07/03/20 1131  BP: 113/84 113/84 103/79 109/77  Pulse: 85 82 78 71  Resp: 14 20  20 16  Temp: 98.2 F (36.8 C) 98.4 F (36.9 C) 98.2 F (36.8 C) 97.9 F (36.6 C)  TempSrc: Oral Oral  Oral Oral  SpO2: 97% 99% 96% 95%  Weight:      Height:        Intake/Output Summary (Last 24 hours) at 07/03/2020 1209 Last data filed at 07/02/2020 2050 Gross per 24 hour  Intake 240 ml  Output 500 ml  Net -260 ml   Filed Weights   07/01/20 1346  Weight: (!) 137 kg    Exam:  . General: 69 y.o. year-old male well developed well nourished in no acute distress.  Alert and oriented x3. . Cardiovascular: Regular rate and rhythm with no rubs or gallops.  No thyromegaly or JVD noted.   Marland Kitchen Respiratory: Clear to auscultation with no wheezes or rales. Good inspiratory effort. . Abdomen: Obese, nontender, hyperactive bowel sounds present.  . Musculoskeletal: No lower extremity edema bilaterally.   . Skin: No ulcerative lesions noted or rashes, . Psychiatry: Mood is appropriate for condition and setting   Data Reviewed: CBC: Recent Labs  Lab 07/01/20 1433 07/02/20 0056 07/03/20 0412  WBC 24.9* 17.8* 14.2*  HGB 17.0 18.6* 15.4  HCT 50.5 54.9* 46.3  MCV 92.0 91.5 92.4  PLT 318 295 945   Basic Metabolic Panel: Recent Labs  Lab 07/01/20 1433 07/02/20 0056 07/03/20 0412  NA 139 137 137  K 3.2* 3.9 3.4*  CL 104 100 101  CO2 '22 23 24  ' GLUCOSE 162* 190* 106*  BUN '16 17 16  ' CREATININE 1.19 1.45* 1.45*  CALCIUM 9.0 8.5* 8.3*  MG  --   --  1.5*   GFR: Estimated Creatinine Clearance: 67.9 mL/min (A) (by C-G formula based on SCr of 1.45 mg/dL (H)). Liver Function Tests: Recent Labs  Lab 07/01/20 1433 07/02/20 0056  AST 20 18  ALT 26 23  ALKPHOS 70 65  BILITOT 0.8 0.4  PROT 7.4 6.6  ALBUMIN 3.7 3.3*   Recent Labs  Lab 07/01/20 1433  LIPASE 32   No results for input(s): AMMONIA in the last 168 hours. Coagulation Profile: Recent Labs  Lab 07/02/20 0056  INR 1.1   Cardiac Enzymes: No results for input(s): CKTOTAL, CKMB, CKMBINDEX, TROPONINI in the last 168 hours. BNP (last 3 results) No results for input(s): PROBNP in the last 8760 hours. HbA1C: No results  for input(s): HGBA1C in the last 72 hours. CBG: Recent Labs  Lab 07/03/20 0739 07/03/20 1125  GLUCAP 116* 164*   Lipid Profile: No results for input(s): CHOL, HDL, LDLCALC, TRIG, CHOLHDL, LDLDIRECT in the last 72 hours. Thyroid Function Tests: No results for input(s): TSH, T4TOTAL, FREET4, T3FREE, THYROIDAB in the last 72 hours. Anemia Panel: No results for input(s): VITAMINB12, FOLATE, FERRITIN, TIBC, IRON, RETICCTPCT in the last 72 hours. Urine analysis:    Component Value Date/Time   COLORURINE AMBER (A) 07/01/2020 1348   APPEARANCEUR HAZY (A) 07/01/2020 1348   LABSPEC 1.020 07/01/2020 1348   PHURINE 5.0 07/01/2020 1348   GLUCOSEU NEGATIVE 07/01/2020 1348   GLUCOSEU NEGATIVE 04/14/2015 1717   HGBUR NEGATIVE 07/01/2020 1348   BILIRUBINUR SMALL (A) 07/01/2020 1348   BILIRUBINUR negative 06/20/2020 1244   KETONESUR 5 (A) 07/01/2020 1348   PROTEINUR 30 (A) 07/01/2020 1348   UROBILINOGEN 0.2 06/20/2020 1244   UROBILINOGEN 0.2 04/14/2015 1717   NITRITE NEGATIVE 07/01/2020 1348   LEUKOCYTESUR NEGATIVE 07/01/2020 1348   Sepsis Labs: '@LABRCNTIP' (procalcitonin:4,lacticidven:4)  ) Recent Results (from the past 240 hour(s))  Novel Coronavirus, NAA ([facility])     Status: None   Collection Time: 06/28/20 11:33 AM   Specimen: Nasopharyngeal(NP) swabs in vial transport medium  Result Value Ref Range Status   SARS-CoV-2, NAA Not Detected Not Detected Final    Comment: This nucleic acid amplification test was developed and its performance characteristics determined by [employer], [employer] and [employer]. Nucleic acid amplification tests include RT-PCR and TMA. This test has not been FDA cleared or approved. This test has been authorized by FDA under an Emergency Use Authorization (EUA). This test is only authorized for the duration of time the declaration that circumstances exist justifying the authorization of the emergency use of in vitro diagnostic tests for detection of SARS-CoV-2 virus and/or  diagnosis of COVID-19 infection under section 564(b)(1) of the Act, 21 U.S.C. 454UJW-1(X) (1), unless the authorization is terminated or revoked sooner. When diagnostic testing is negative, the possibility of a false negative result should be considered in the context of a patient's recent exposures and the presence of clinical signs and symptoms consistent with COVID-19. An individual without symptoms of COVID-19 and who is not shedding SARS-CoV-2 virus wo uld expect to have a negative (not detected) result in this assay.   SARS-COV-2, NAA 2 DAY TAT     Status: None   Collection Time: 06/28/20 11:33 AM  Result Value Ref Range Status   SARS-CoV-2, NAA 2 DAY TAT Performed  Final  Resp Panel by RT-PCR (Flu A&B, Covid) Nasopharyngeal Swab     Status: None   Collection Time: 07/01/20  3:06 PM   Specimen: Nasopharyngeal Swab; Nasopharyngeal(NP) swabs in vial transport medium  Result Value Ref Range Status   SARS Coronavirus 2 by RT PCR NEGATIVE NEGATIVE Final    Comment: (NOTE) SARS-CoV-2 target nucleic acids are NOT DETECTED.  The SARS-CoV-2 RNA is generally detectable in upper respiratory specimens during the acute phase of infection. The lowest concentration of SARS-CoV-2 viral copies this assay can detect is 138 copies/mL. A negative result does not preclude SARS-Cov-2 infection and should not be used as the sole basis for treatment or other patient management decisions. A negative result may occur with  improper specimen collection/handling, submission of specimen other than nasopharyngeal swab, presence of viral mutation(s) within the areas targeted by this assay, and inadequate number of viral copies(<138 copies/mL). A negative result must be combined with clinical observations, patient history, and epidemiological information. The expected result is Negative.  Fact Sheet for Patients:  EntrepreneurPulse.com.au  Fact Sheet for Healthcare Providers:   IncredibleEmployment.be  This test is no t yet approved or cleared by the [place] FDA and  has been authorized for detection and/or diagnosis of SARS-CoV-2 by FDA under an Emergency Use Authorization (EUA). This EUA will remain  in effect (meaning this test can be used) for the duration of the COVID-19 declaration under Section 564(b)(1) of the Act, 21 U.S.C.section 360bbb-3(b)(1), unless the authorization is terminated  or revoked sooner.       Influenza A by PCR NEGATIVE NEGATIVE Final   Influenza B by PCR NEGATIVE NEGATIVE Final    Comment: (NOTE) The Xpert Xpress SARS-CoV-2/FLU/RSV plus assay is intended as an aid in the diagnosis of influenza from Nasopharyngeal swab specimens and should not be used as a sole basis for treatment. Nasal washings and aspirates are unacceptable for Xpert Xpress SARS-CoV-2/FLU/RSV testing.  Fact Sheet for Patients: EntrepreneurPulse.com.au  Fact Sheet for Healthcare Providers: IncredibleEmployment.be  This test is not yet approved or cleared by the [place] and  has been authorized for detection and/or diagnosis of SARS-CoV-2 by FDA under an Emergency Use Authorization (EUA). This EUA will remain in effect (meaning this test can be used) for the duration of the COVID-19 declaration under Section 564(b)(1) of the Act, 21 U.S.C. section 360bbb-3(b)(1), unless the authorization is terminated or revoked.  Performed at [facility], [place] [address], [place], [place] [address]   C Difficile Quick Screen (NO PCR Reflex)     Status: None   Collection Time: 07/01/20  4:06 PM  Result Value Ref Range Status   C Diff antigen NEGATIVE NEGATIVE Final   C Diff toxin NEGATIVE NEGATIVE Final   C Diff interpretation No C. difficile detected.  Final    Comment: Performed at [facility], [place] [address] [place], [place] [address]  Gastrointestinal  Panel by PCR , Stool     Status: Abnormal   Collection Time: 07/01/20  4:07 PM  Result Value Ref Range Status   Campylobacter species NOT DETECTED NOT DETECTED Final   Plesimonas shigelloides NOT DETECTED NOT DETECTED Final   Salmonella species NOT DETECTED NOT DETECTED Final   Yersinia enterocolitica NOT DETECTED NOT DETECTED Final   Vibrio species NOT DETECTED NOT DETECTED Final   Vibrio cholerae NOT DETECTED NOT DETECTED Final   Enteroaggregative E coli (EAEC) NOT DETECTED NOT DETECTED Final   Enteropathogenic E coli (EPEC) NOT DETECTED NOT DETECTED Final   Enterotoxigenic E coli (ETEC) NOT DETECTED NOT DETECTED Final   Shiga like toxin producing E coli (STEC) NOT DETECTED NOT DETECTED Final   Shigella/Enteroinvasive E coli (EIEC) NOT DETECTED NOT DETECTED Final   Cryptosporidium NOT DETECTED NOT DETECTED Final   Cyclospora cayetanensis NOT DETECTED NOT DETECTED Final   Entamoeba histolytica NOT DETECTED NOT DETECTED Final   Giardia lamblia NOT DETECTED NOT DETECTED Final   Adenovirus F40/41 NOT DETECTED NOT DETECTED Final   Astrovirus NOT DETECTED NOT DETECTED Final   Norovirus GI/GII DETECTED (A) NOT DETECTED Final    Comment: RESULT CALLED TO, READ BACK BY AND VERIFIED WITH: DANA GUNDLACH AT 1625 07/02/20.PMF    Rotavirus A NOT DETECTED NOT DETECTED Final   Sapovirus (I, II, IV, and V) NOT DETECTED NOT DETECTED Final    Comment: Performed at [facility], [address] [place], [place] [address]  Culture, blood (routine x 2)     Status: None (Preliminary result)   Collection Time: 07/01/20  7:27 PM   Specimen: BLOOD LEFT HAND  Result Value Ref Range Status   Specimen Description   Final    BLOOD LEFT HAND Performed at [place] [address], [place], [place] [address]    Special Requests   Final    BOTTLES DRAWN AEROBIC AND ANAEROBIC Blood Culture results may not be optimal due to an inadequate volume of blood received in culture  bottles Performed at [facility] [address], [place], [place] [address]    Culture   Final    NO GROWTH 1 DAY Performed at [facility], [place] [address] [place], [place] [address]    Report Status PENDING  Incomplete  Culture, blood (routine x 2)     Status: None (Preliminary result)   Collection Time: 07/03/20  4:12 AM   Specimen: BLOOD  Result Value Ref Range Status   Specimen Description   Final    BLOOD BLOOD LEFT HAND Performed at [place] [address], [place], [place] [address]  Special Requests   Final    BOTTLES DRAWN AEROBIC ONLY Blood Culture results may not be optimal due to an inadequate volume of blood received in culture bottles Performed at [facility] [address] [place], [place] [address]    Culture   Final    NO GROWTH < 12 HOURS Performed at [facility] [address], [place], [place] [address]    Report Status PENDING  Incomplete      Studies: No results found.  Scheduled Meds: . enoxaparin (LOVENOX) injection  70 mg Subcutaneous Q24H  . feeding supplement (KATE FARMS STANDARD 1.4)  325 mL Oral BID BM  . [START ON 07/04/2020] levothyroxine  112 mcg Oral QAC breakfast  . magnesium oxide  400 mg Oral QHS  . multivitamin with minerals  1 tablet Oral Daily  . pantoprazole  40 mg Oral Daily  . potassium chloride  40 mEq Oral BID  . timolol  1 drop Both Eyes Daily    Continuous Infusions: . lactated ringers    . magnesium sulfate bolus IVPB       LOS: 2 days     Kayleen Memos, MD [facility] Pager [phone]  If 7PM-7AM, please contact night-coverage [email or web address] Password [facility] 07/03/2020, 12:09 PM

## 2020-07-03 NOTE — Progress Notes (Signed)
CROSS COVER [facility]GI Subjective: Since I last evaluated the patient, he seems to be doing very well.  His diarrhea, abdominal pain and nausea have resolved.  He is tolerating his diet well.  GI studies showed evidence of norovirus on admission.  Is anticipating discharge tomorrow.  Objective: Vital signs in last 24 hours: Temp:  [98.2 F (36.8 C)-98.4 F (36.9 C)] 98.2 F (36.8 C) (12/19 0623) Pulse Rate:  [78-98] 78 (12/19 0623) Resp:  [12-20] 20 (12/19 0623) BP: (103-113)/(79-84) 103/79 (12/19 0623) SpO2:  [96 %-99 %] 96 % (12/19 0623) Last BM Date: 07/02/20  Intake/Output from previous day: 12/18 0701 - 12/19 0700 In: 480 [P.O.:480] Out: 500 [Urine:500] Intake/Output this shift: No intake/output data recorded.  General appearance: alert, cooperative, appears stated age, no distress and morbidly obese Resp: clear to auscultation bilaterally Cardio: regular rate and rhythm, S1, S2 normal, no murmur, click, rub or gallop GI: soft, non-tender; bowel sounds normal; no masses,  no organomegaly  Lab Results: Recent Labs    07/01/20 1433 07/02/20 0056 07/03/20 0412  WBC 24.9* 17.8* 14.2*  HGB 17.0 18.6* 15.4  HCT 50.5 54.9* 46.3  PLT 318 295 239   BMET Recent Labs    07/01/20 1433 07/02/20 0056 07/03/20 0412  NA 139 137 137  K 3.2* 3.9 3.4*  CL 104 100 101  CO2 22 23 24   GLUCOSE 162* 190* 106*  BUN 16 17 16   CREATININE 1.19 1.45* 1.45*  CALCIUM 9.0 8.5* 8.3*   LFT Recent Labs    07/02/20 0056  PROT 6.6  ALBUMIN 3.3*  AST 18  ALT 23  ALKPHOS 65  BILITOT 0.4   PT/INR Recent Labs    07/02/20 0056  LABPROT 14.0  INR 1.1   Hepatitis Panel No results for input(s): HEPBSAG, HCVAB, HEPAIGM, HEPBIGM in the last 72 hours. C-Diff Recent Labs    07/01/20 1606  CDIFFTOX NEGATIVE   Studies/Results: CT Abdomen Pelvis W Contrast  Result Date: 07/01/2020 CLINICAL DATA:  Abdominal pain and distension. Nausea and diarrhea. EXAM: CT ABDOMEN AND PELVIS WITH  CONTRAST TECHNIQUE: Multidetector CT imaging of the abdomen and pelvis was performed using the standard protocol following bolus administration of intravenous contrast. CONTRAST:  151mL OMNIPAQUE IOHEXOL 300 MG/ML  SOLN COMPARISON:  None. FINDINGS: Lower chest: Linear right lower lobe atelectasis or scarring. No pleural fluid. The heart is normal in size. Hepatobiliary: Diffusely decreased hepatic density consistent with steatosis. Peripheral branching air consistent with portal venous gas. No focal hepatic abnormality. Gallbladder physiologically distended, no calcified stone. No biliary dilatation. Pancreas: No ductal dilatation or inflammation. Spleen: Normal in size without focal abnormality. Adrenals/Urinary Tract: Normal adrenal glands. No hydronephrosis or perinephric edema. Homogeneous renal enhancement with symmetric excretion on delayed phase imaging. Urinary bladder is completely empty. Stomach/Bowel: Fluid/ingested contents distends the stomach. Non dependent air in the stomach, possibly but not definitively in the gastric wall, series 2, image 22. No definite gastric wall thickening, there is no perigastric inflammation. Normal positioning and appearance of the duodenum and ligament of Treitz. Small bowel primarily decompressed. No obstruction or inflammation. Diminutive appendix without appendicitis. Multifocal colonic diverticulosis without diverticulitis. No colonic wall thickening or inflammation. There is liquid stool in the colon. No pericolonic edema. Vascular/Lymphatic: Small amount of air in the main portal vein, series 2, image 24. No portal vein thrombus. Minor aortic atherosclerosis. Major arterial branches are patent. There are small central mesenteric lymph nodes. Prominent periportal nodes, nonspecific but typically reactive. No pelvic adenopathy. Reproductive: Prostate is  unremarkable. Other: No free air. No ascites or free fluid. No intra-abdominal abscess. Fat in the inguinal canals.  Musculoskeletal: Diffuse degenerative change throughout the spine. There are no acute or suspicious osseous abnormalities. IMPRESSION: 1. Portal venous gas in the main portal vein is well as peripheral intrahepatic branches. Non dependent air in the stomach, possibly but not definitively in the gastric wall, equivocal for gastric pneumatosis. No definite gastric wall thickening or perigastric inflammation. There are no other findings of bowel ischemia or inflammatory change. Portal venous gas is suggestive of bowel ischemia, however there is no evidence of bowel inflammation or wall thickening to support this. Recommend close clinical follow-up. 2. Liquid stool in the colon, can be seen with diarrheal illness. No colonic inflammation. 3. Colonic diverticulosis without diverticulitis. 4. Hepatic steatosis. Aortic Atherosclerosis (ICD10-I70.0). These results were called by telephone at the time of interpretation on 07/01/2020 at 7:11 pm to provider [facility] , who verbally acknowledged these results. Electronically Signed   By: Keith Rake M.D.   On: 07/01/2020 19:11    Medications: I have reviewed the patient's current medications.  Assessment/Plan: 1) Gastroenteritis secondary to norovirus infection-WBC count is improved to 14.2K down from 24.9K on admission.  Stools are more formed outpatient [facility] anticipating discharge tomorrow. 2) Severe hepatic steatosis/morbid obesity/abnormal CT scan with portal venous gas of unclear significance-a low-fat diet has been advised and the importance of weight loss with regards to hepatic steatosis has been discussed with the patient.  He has been advised to follow-up with primary PCP for the abnormal CT scan noted on admission. 3) Extensive diverticulosis on CT scan without evidence of diverticulitis. 4) GERD: On omeprazole at home. 5) Insomnia/sleep apnea/morbid obesity. 6) Hyperlipidemia. We will sign off for now.  Please have the patient follow-up  with Dr. Lucio [place], [facility] within the next 2 to 4 weeks after discharge.  LOS: 2 days   Juanita Craver 07/03/2020, 8:48 AM

## 2020-07-03 NOTE — Plan of Care (Signed)

## 2020-07-03 NOTE — Plan of Care (Signed)
  Problem: Fluid Volume: Goal: Hemodynamic stability will improve Outcome: Progressing   Problem: Clinical Measurements: Goal: Signs and symptoms of infection will decrease Outcome: Progressing   Problem: Nutrition: Goal: Adequate nutrition will be maintained Outcome: Progressing   Problem: Safety: Goal: Ability to remain free from injury will improve Outcome: Progressing

## 2020-07-04 ENCOUNTER — Telehealth: Payer: Self-pay

## 2020-07-04 LAB — CBC
HCT: 46.6 % (ref 39.0–52.0)
Hemoglobin: 15.7 g/dL (ref 13.0–17.0)
MCH: 31.2 pg (ref 26.0–34.0)
MCHC: 33.7 g/dL (ref 30.0–36.0)
MCV: 92.5 fL (ref 80.0–100.0)
Platelets: 317 10*3/uL (ref 150–400)
RBC: 5.04 MIL/uL (ref 4.22–5.81)
RDW: 13.3 % (ref 11.5–15.5)
WBC: 16.1 10*3/uL — ABNORMAL HIGH (ref 4.0–10.5)
nRBC: 0 % (ref 0.0–0.2)

## 2020-07-04 LAB — BASIC METABOLIC PANEL
Anion gap: 10 (ref 5–15)
BUN: 12 mg/dL (ref 8–23)
CO2: 22 mmol/L (ref 22–32)
Calcium: 8.8 mg/dL — ABNORMAL LOW (ref 8.9–10.3)
Chloride: 104 mmol/L (ref 98–111)
Creatinine, Ser: 1.07 mg/dL (ref 0.61–1.24)
GFR, Estimated: >60 mL/min (ref >60)
Glucose, Bld: 125 mg/dL — ABNORMAL HIGH (ref 70–99)
Potassium: 3.9 mmol/L (ref 3.5–5.1)
Sodium: 136 mmol/L (ref 135–145)

## 2020-07-04 NOTE — Telephone Encounter (Signed)
Transition Care Management Follow-up Telephone Call  Date of discharge and from where: 07/04/20  How have you been since you were released from the hospital? Feeling good  Any questions or concerns? White blood count still high not sure why but that's the only concern  Items Reviewed:  Did the pt receive and understand the discharge instructions provided? yes  Medications obtained and verified? No and yes  Any new allergies since your discharge? no  Dietary orders reviewed? yes  Do you have support at home? yes  Other (ie: DME, Home Health, etc) no  Functional Questionnaire: (I = Independent and D = Dependent)  Bathing/Dressing- I   Meal Prep- I  Eating- I  Maintaining continence- I  Transferring/Ambulation- I  Managing Meds- I   Follow up appointments reviewed:    PCP Hospital f/u appt confirmed? Yes  Specialist Hospital f/u appt confirmed? Yes  Are transportation arrangements needed? Yes  If their condition worsens, is the pt aware to call  their PCP or go to the ED? Yes  Was the patient provided with contact information for the PCP's office or ED? yes  Was the pt encouraged to call back with questions or concerns? yes

## 2020-07-04 NOTE — Discharge Instructions (Signed)
Norovirus Infection  Norovirus infection causes inflammation in the stomach and intestines (gastroenteritis) and food poisoning. It is caused by exposure to a virus in a group of similar viruses called noroviruses. Norovirus spreads very easily from person to person (is very contagious). It often occurs in places where people are in close contact, such as schools, nursing homes, and cruise ships. You can get it from food, water, surfaces, or other people who have the virus (are contaminated). Norovirus is also found in the stool (feces) or vomit of infected people. You can spread the infection as soon as you feel sick, and you may continue to be contagious after you recover. What are the causes? This condition is caused by contact with norovirus. You can catch norovirus if you:  Eat or drink something that is contaminated with norovirus.  Touch surfaces or objects that are contaminated with norovirus and then put your hand in or by your mouth or nose.  Have direct contact with an infected person who has symptoms.  Share food, drink, or utensils with someone who is sick with norovirus. What are the signs or symptoms? Symptoms usually begin within 12 hours to 2 days after you become infected. Most norovirus symptoms affect the digestive system.Symptoms may include:  Nausea.  Vomiting.  Diarrhea.  Stomach cramps.  Fever.  Chills.  Headache.  Muscle aches.  Tiredness. How is this diagnosed? This condition may be diagnosed based on:  Your symptoms.  A physical exam.  A stool test. How is this treated? There is no specific treatment for norovirus. Most people get better without treatment in about 2 days. Young children, the elderly, and people who are already sick may take up to 6 days to recover. Follow these instructions at home: Eating and drinking  Drink plenty of water to replace fluids that are lost through diarrhea and vomiting. This prevents dehydration. Drink enough  fluid to keep your urine clear or pale yellow.  Drink clear fluids in small amounts as you are able. Clear fluids include water, ice chips, fruit juice with water added (diluted fruit juice), and low-calorie sports drinks. ? Avoid fluids that contain a lot of sugar or caffeine, such as energy drinks, sports drinks, and soda. ? Avoid alcohol.  If instructed by your health care provider, drink an oral rehydration solution (ORS). This is a drink that is sold at pharmacies and retail stores. An ORS contains minerals (electrolytes) that you can lose through diarrhea and vomiting.  Eat bland, easy-to-digest foods in small amounts as you are able. These foods include bananas, applesauce, rice, lean meats, toast, and crackers. ? Avoid spicy or fatty foods. General instructions   Rest at home while you recover.  Do not prepare food for others while you are infected. Wait at least 3 days after you recover from the illness to do this.  Take over-the-counter and prescription medicines only as told by your health care provider.  Wash your hands frequently with soap and water. If soap and water are not available, use hand sanitizer.  Make sure that all people in your household wash their hands well and often.  Keep all follow-up visits as told by your health care provider. This is important. How is this prevented? To help prevent the spread of norovirus:  Stay at home if you are feeling sick. This will reduce the risk of spreading the virus to others.  Wash your hands often with soap and water for at least 20 seconds, especially after using the  toilet or changing a diaper.  Wash fruits and vegetables thoroughly before peeling, preparing, or serving them.  Throw out any food that a sick person may have touched.  Disinfect contaminated surfaces immediately after someone in the household has been sick. Use a bleach-based household cleaner.  Immediately remove and wash soiled clothes or  sheets. Contact a health care provider if:  You have vomiting, diarrhea, or stomach pain that gets worse.  You have symptoms that do not go away after 2-6 days.  You have a fever.  You cannot drink without vomiting.  You feel light-headed or dizzy.  Your symptoms get worse. Get help right away if: You develop symptoms of dehydration that do not improve with fluid replacement, such as:  Excessive sleepiness.  Lack of tears.  Very little urine production.  Dry mouth.  Muscle cramps.  Weak pulse.  Confusion. Summary  Norovirus infection is common and often occurs in places where people are in close contact, such as schools, nursing homes, and cruise ships.  To help prevent the spread of this infection, wash hands with soap and water for at least 20 seconds before handling food or after having contact with stool or body fluids.  There is no specific treatment for norovirus, but most people get better without treatment in about 2 days. People who are healthy when infected often recover sooner than those who are elderly, young, or already sick.  Replace lost fluids by drinking plenty of water, or by drinking oral rehydration solution (ORS), which contains important minerals called electrolytes. This prevents dehydration. This information is not intended to replace advice given to you by your health care provider. Make sure you discuss any questions you have with your health care provider. Document Revised: 10/24/2018 Document Reviewed: 08/08/2016 [employer] Patient Education  [employer].

## 2020-07-04 NOTE — Discharge Summary (Signed)
Physician Discharge Summary  Connor Becker. OVZ:[id number] DOB: 11-Dec-1950 DOA: 07/01/2020  PCP: Glendale Chard, MD  Admit date: 07/01/2020 Discharge date: 07/04/2020  Admitted From: Home Disposition: Home Recommendations for Outpatient Follow-up:  1. Follow up with PCP in 1-2 weeks 2. Please obtain BMP/CBC in one week 3. Please follow up with Dr. Fuller Plan for biopsy results  Home Health: None Equipment/Devices: None Discharge Condition: Stable CODE STATUS: Full code Diet recommendation: Cardiac Brief/Interim Summary:Connor Becker.is a 69 y.o.malewith medical history significant ofhyperlipidemia, GERD, diabetes, chronic insomnia, obstructive sleep apnea, vitamin D deficiency, who started having diarrhea and abdominal pain for about 6 days.Symptoms started after eating outside at [facility] last Thursday. C. difficile negative. GI pathogen pending. Had leukocytosis. CT scan of the abdomen and pelvis with contrast,done on admission revealed severe hepatic steatosis with peripheral branching air consistent with portal venous gas along with a physiologically distended gallbladder without any biliary ductal dilatation;multiple diverticula without diverticulitis were nodes noted along with small amount of air in the main portal vein with no evidence of portal vein thrombosis and minor aortic atherosclerosis. Prominent periportal nodes were noted there was thought to be reactive;diffuse degenerative disease is noted in the spine with no acute abnormalities. GI was also consulted.Received ceftriaxone and Zosyn in ED.   Discharge Diagnoses:  Principal Problem:   Sepsis ([place]) Active Problems:   GERD   Diabetes ([place])   Obstructive sleep apnea   Class 3 severe obesity with serious comorbidity and body mass index (BMI) of 40.0 to 44.9 in adult [facility])   Dyslipidemia   Diarrhea of presumed infectious origin  Norovirus gastroenteritis with acute diarrhea-patient was admitted  with diarrhea lasting for 71 weeks.  GI panel was positive for norovirus on 07/01/2020.  He was treated with IV fluids and as needed Imodium.  His diarrhea significantly improved and he was able to tolerate a diet prior to discharge.  His white count also improved from 24.9-14.2.  He will follow-up with Dr. Fuller Plan in 2 to 4 weeks.  He was seen by Dr. Collene Mares during this hospital stay.  AKI, likely prerenal in the setting of dehydration from GI losses, diarrhea.    Renal functions improved with IV fluids.    Sepsis ruled out.  Initially met sepsis criteria. C. difficile negative. GI panel positive for norovirus.Procalcitonin at 0.2,lactic acid 2.5>>2.2.FOBT positive,hemoglobin remained stable. DCd IV antibiotics.  Received Rocephin and IV Zosyn   Hypokalemia and hypomagnesemia resolved.  Type 2 diabetes mellitus with hyperglycemia. Hemoglobin A1c 7.6 on 06/20/2020. Continue home meds.  GERD. -Continue PPI  Obstructive sleep apnea.Not on CPAP  Nutrition Problem: Inadequate oral intake Etiology: acute illness,diarrhea,nausea (Norovirus)    Signs/Symptoms: per patient/family report     Interventions: Ensure Enlive (each supplement provides 350kcal and 20 grams of protein),MVI  Estimated body mass index is 39.84 kg/m as calculated from the following:   Height as of this encounter: _0  (1.854 m).   Weight as of this encounter: 137 kg.  Discharge Instructions  Discharge Instructions    Diet - low sodium heart healthy   Complete by: As directed    Increase activity slowly   Complete by: As directed      Allergies as of 07/04/2020      Reactions   Horseradish [armoracia Rusticana Ext (horseradish)]    Lipitor [atorvastatin] Other (See Comments)   Leg cramps   Metformin And Related Other (See Comments)   GI upset   Rye Grass Flower Pollen Extract [  gramineae Pollens]    Soy Allergy       Medication List    STOP taking these medications   aspirin  325 MG tablet   ibuprofen 200 MG tablet Commonly known as: ADVIL   lisinopril 5 MG tablet Commonly known as: ZESTRIL   rosuvastatin 10 MG tablet Commonly known as: Crestor     TAKE these medications   latanoprost 0.005 % ophthalmic solution Commonly known as: XALATAN Place 1 drop into both eyes daily.   levothyroxine 112 MCG tablet Commonly known as: SYNTHROID Take 1 tablet by mouth once daily What changed: when to take this   Magnesium 400 MG Caps Take 400 mg by mouth at bedtime.   methocarbamol 750 MG tablet Commonly known as: ROBAXIN Take 750 mg by mouth 4 (four) times daily as needed for muscle spasms.   Multivitamin/Extra Vitamin D3 Chew Chew 1 tablet by mouth daily.   omeprazole 20 MG capsule Commonly known as: PRILOSEC Take 20 mg by mouth daily as needed (acid reflux).   OneTouch Verio test strip Generic drug: glucose blood USE STRIP TO CHECK BLOOD SUGAR UP TO 4 TIMES PER DAY   pseudoephedrine 30 MG tablet Commonly known as: SUDAFED Take 60 mg by mouth 2 (two) times daily as needed for congestion.   Rybelsus 3 MG Tabs Generic drug: Semaglutide Take 3 mg by mouth daily with breakfast.   tadalafil 5 MG tablet Commonly known as: CIALIS Take 5 mg by mouth daily.   testosterone enanthate 200 MG/ML injection Commonly known as: DELATESTRYL Inject 200 mg into the muscle every 14 (fourteen) days.   timolol 0.5 % ophthalmic solution Commonly known as: TIMOPTIC Place 1 drop into both eyes daily.   Turmeric 500 MG Caps Take 500 mg by mouth daily.   vitamin C 500 MG tablet Commonly known as: ASCORBIC ACID Take 1,000 mg by mouth daily.   Vitamin D (Ergocalciferol) 1.25 MG (50000 UNIT) Caps capsule Commonly known as: DRISDOL Take one capsule by mouth on Tues/Fridays What changed:   how much to take  how to take this  when to take this       Follow-up Information    Ladene Artist, MD. Call in 1 day(s).   Specialty: Gastroenterology Why:  please call for a post hospital follow up appointment Contact information: [address] [place] [address] [phone]        Glendale Chard, MD. Call in 1 day(s).   Specialty: Internal Medicine Why: please call for a post hospital follow up appointment Contact information: [address] [place] [address] [phone]              Allergies  Allergen Reactions  . Horseradish [Armoracia Rusticana Ext (Horseradish)]   . Lipitor [Atorvastatin] Other (See Comments)    Leg cramps  . Metformin And Related Other (See Comments)    GI upset  . Rye Grass Flower Pollen Extract [Gramineae Pollens]   . Soy Allergy     Consultations: Dr. Collene Mares  Procedures/Studies: CT Abdomen Pelvis W Contrast  Result Date: 07/01/2020 CLINICAL DATA:  Abdominal pain and distension. Nausea and diarrhea. EXAM: CT ABDOMEN AND PELVIS WITH CONTRAST TECHNIQUE: Multidetector CT imaging of the abdomen and pelvis was performed using the standard protocol following bolus administration of intravenous contrast. CONTRAST:  166m OMNIPAQUE IOHEXOL 300 MG/ML  SOLN COMPARISON:  None. FINDINGS: Lower chest: Linear right lower lobe atelectasis or scarring. No pleural fluid. The heart is normal in size. Hepatobiliary: Diffusely  decreased hepatic density consistent with steatosis. Peripheral branching air consistent with portal venous gas. No focal hepatic abnormality. Gallbladder physiologically distended, no calcified stone. No biliary dilatation. Pancreas: No ductal dilatation or inflammation. Spleen: Normal in size without focal abnormality. Adrenals/Urinary Tract: Normal adrenal glands. No hydronephrosis or perinephric edema. Homogeneous renal enhancement with symmetric excretion on delayed phase imaging. Urinary bladder is completely empty. Stomach/Bowel: Fluid/ingested contents distends the stomach. Non dependent air in the stomach, possibly but not definitively in the gastric wall, series 2,  image 22. No definite gastric wall thickening, there is no perigastric inflammation. Normal positioning and appearance of the duodenum and ligament of Treitz. Small bowel primarily decompressed. No obstruction or inflammation. Diminutive appendix without appendicitis. Multifocal colonic diverticulosis without diverticulitis. No colonic wall thickening or inflammation. There is liquid stool in the colon. No pericolonic edema. Vascular/Lymphatic: Small amount of air in the main portal vein, series 2, image 24. No portal vein thrombus. Minor aortic atherosclerosis. Major arterial branches are patent. There are small central mesenteric lymph nodes. Prominent periportal nodes, nonspecific but typically reactive. No pelvic adenopathy. Reproductive: Prostate is unremarkable. Other: No free air. No ascites or free fluid. No intra-abdominal abscess. Fat in the inguinal canals. Musculoskeletal: Diffuse degenerative change throughout the spine. There are no acute or suspicious osseous abnormalities. IMPRESSION: 1. Portal venous gas in the main portal vein is well as peripheral intrahepatic branches. Non dependent air in the stomach, possibly but not definitively in the gastric wall, equivocal for gastric pneumatosis. No definite gastric wall thickening or perigastric inflammation. There are no other findings of bowel ischemia or inflammatory change. Portal venous gas is suggestive of bowel ischemia, however there is no evidence of bowel inflammation or wall thickening to support this. Recommend close clinical follow-up. 2. Liquid stool in the colon, can be seen with diarrheal illness. No colonic inflammation. 3. Colonic diverticulosis without diverticulitis. 4. Hepatic steatosis. Aortic Atherosclerosis (ICD10-I70.0). These results were called by telephone at the time of interpretation on 07/01/2020 at 7:11 pm to provider [facility] , who verbally acknowledged these results. Electronically Signed   By: Keith Rake M.D.    On: 07/01/2020 19:11    (Echo, Carotid, EGD, Colonoscopy, ERCP)    Subjective:  Patient resting in bed anxious to go home tolerated diet had 1 BM loose last night and 1 this morning very little amount no abdominal cramps no nausea vomiting Discharge Exam: Vitals:   07/03/20 2148 07/04/20 0607  BP: 121/84 116/85  Pulse: 70 70  Resp: 20 14  Temp: 98.2 F (36.8 C) 98.4 F (36.9 C)  SpO2: 100% 93%   Vitals:   07/03/20 0623 07/03/20 1131 07/03/20 2148 07/04/20 0607  BP: 103/79 109/77 121/84 116/85  Pulse: 78 71 70 70  Resp: _0 Temp: 98.2 F (36.8 C) 97.9 F (36.6 C) 98.2 F (36.8 C) 98.4 F (36.9 C)  TempSrc: Oral Oral Oral Oral  SpO2: 96% 95% 100% 93%  Weight:      Height:        General: Pt is alert, awake, not in acute distress Cardiovascular: RRR, S1/S2 +, no rubs, no gallops Respiratory: CTA bilaterally, no wheezing, no rhonchi Abdominal: Soft, NT, ND, bowel sounds + Extremities: no edema, no cyanosis    The results of significant diagnostics from this hospitalization (including imaging, microbiology, ancillary and laboratory) are listed below for reference.     Microbiology: Recent Results (from the past 240 hour(s))  Novel Coronavirus, NAA ([facility])  Status: None   Collection Time: 06/28/20 11:33 AM   Specimen: Nasopharyngeal(NP) swabs in vial transport medium  Result Value Ref Range Status   SARS-CoV-2, NAA Not Detected Not Detected Final    Comment: This nucleic acid amplification test was developed and its performance characteristics determined by [employer], [employer] and [employer]. Nucleic acid amplification tests include RT-PCR and TMA. This test has not been FDA cleared or approved. This test has been authorized by FDA under an Emergency Use Authorization (EUA). This test is only authorized for the duration of time the declaration that circumstances exist justifying the authorization of the emergency use of in vitro diagnostic tests for detection  of SARS-CoV-2 virus and/or diagnosis of COVID-19 infection under section 564(b)(1) of the Act, 21 U.S.C. 500BBC-4(U) (1), unless the authorization is terminated or revoked sooner. When diagnostic testing is negative, the possibility of a false negative result should be considered in the context of a patient's recent exposures and the presence of clinical signs and symptoms consistent with COVID-19. An individual without symptoms of COVID-19 and who is not shedding SARS-CoV-2 virus wo uld expect to have a negative (not detected) result in this assay.   SARS-COV-2, NAA 2 DAY TAT     Status: None   Collection Time: 06/28/20 11:33 AM  Result Value Ref Range Status   SARS-CoV-2, NAA 2 DAY TAT Performed  Final  Resp Panel by RT-PCR (Flu A&B, Covid) Nasopharyngeal Swab     Status: None   Collection Time: 07/01/20  3:06 PM   Specimen: Nasopharyngeal Swab; Nasopharyngeal(NP) swabs in vial transport medium  Result Value Ref Range Status   SARS Coronavirus 2 by RT PCR NEGATIVE NEGATIVE Final    Comment: (NOTE) SARS-CoV-2 target nucleic acids are NOT DETECTED.  The SARS-CoV-2 RNA is generally detectable in upper respiratory specimens during the acute phase of infection. The lowest concentration of SARS-CoV-2 viral copies this assay can detect is 138 copies/mL. A negative result does not preclude SARS-Cov-2 infection and should not be used as the sole basis for treatment or other patient management decisions. A negative result may occur with  improper specimen collection/handling, submission of specimen other than nasopharyngeal swab, presence of viral mutation(s) within the areas targeted by this assay, and inadequate number of viral copies(<138 copies/mL). A negative result must be combined with clinical observations, patient history, and epidemiological information. The expected result is Negative.  Fact Sheet for Patients:  EntrepreneurPulse.com.au  Fact Sheet for  Healthcare Providers:  IncredibleEmployment.be  This test is no t yet approved or cleared by the [place] FDA and  has been authorized for detection and/or diagnosis of SARS-CoV-2 by FDA under an Emergency Use Authorization (EUA). This EUA will remain  in effect (meaning this test can be used) for the duration of the COVID-19 declaration under Section 564(b)(1) of the Act, 21 U.S.C.section 360bbb-3(b)(1), unless the authorization is terminated  or revoked sooner.       Influenza A by PCR NEGATIVE NEGATIVE Final   Influenza B by PCR NEGATIVE NEGATIVE Final    Comment: (NOTE) The Xpert Xpress SARS-CoV-2/FLU/RSV plus assay is intended as an aid in the diagnosis of influenza from Nasopharyngeal swab specimens and should not be used as a sole basis for treatment. Nasal washings and aspirates are unacceptable for Xpert Xpress SARS-CoV-2/FLU/RSV testing.  Fact Sheet for Patients: EntrepreneurPulse.com.au  Fact Sheet for Healthcare Providers: IncredibleEmployment.be  This test is not yet approved or cleared by the [place] FDA and has been authorized for detection and/or diagnosis of  SARS-CoV-2 by FDA under an Emergency Use Authorization (EUA). This EUA will remain in effect (meaning this test can be used) for the duration of the COVID-19 declaration under Section 564(b)(1) of the Act, 21 U.S.C. section 360bbb-3(b)(1), unless the authorization is terminated or revoked.  Performed at [facility], [place] [address], [place], [place] [address]   C Difficile Quick Screen (NO PCR Reflex)     Status: None   Collection Time: 07/01/20  4:06 PM  Result Value Ref Range Status   C Diff antigen NEGATIVE NEGATIVE Final   C Diff toxin NEGATIVE NEGATIVE Final   C Diff interpretation No C. difficile detected.  Final    Comment: Performed at [facility], [place] [address] [place], [place] [address]   Gastrointestinal Panel by PCR , Stool     Status: Abnormal   Collection Time: 07/01/20  4:07 PM  Result Value Ref Range Status   Campylobacter species NOT DETECTED NOT DETECTED Final   Plesimonas shigelloides NOT DETECTED NOT DETECTED Final   Salmonella species NOT DETECTED NOT DETECTED Final   Yersinia enterocolitica NOT DETECTED NOT DETECTED Final   Vibrio species NOT DETECTED NOT DETECTED Final   Vibrio cholerae NOT DETECTED NOT DETECTED Final   Enteroaggregative E coli (EAEC) NOT DETECTED NOT DETECTED Final   Enteropathogenic E coli (EPEC) NOT DETECTED NOT DETECTED Final   Enterotoxigenic E coli (ETEC) NOT DETECTED NOT DETECTED Final   Shiga like toxin producing E coli (STEC) NOT DETECTED NOT DETECTED Final   Shigella/Enteroinvasive E coli (EIEC) NOT DETECTED NOT DETECTED Final   Cryptosporidium NOT DETECTED NOT DETECTED Final   Cyclospora cayetanensis NOT DETECTED NOT DETECTED Final   Entamoeba histolytica NOT DETECTED NOT DETECTED Final   Giardia lamblia NOT DETECTED NOT DETECTED Final   Adenovirus F40/41 NOT DETECTED NOT DETECTED Final   Astrovirus NOT DETECTED NOT DETECTED Final   Norovirus GI/GII DETECTED (A) NOT DETECTED Final    Comment: RESULT CALLED TO, READ BACK BY AND VERIFIED WITH: DANA GUNDLACH AT 1625 07/02/20.PMF    Rotavirus A NOT DETECTED NOT DETECTED Final   Sapovirus (I, II, IV, and V) NOT DETECTED NOT DETECTED Final    Comment: Performed at [facility], [place] [place], [place] [address]  Culture, blood (routine x 2)     Status: None (Preliminary result)   Collection Time: 07/01/20  7:27 PM   Specimen: BLOOD LEFT HAND  Result Value Ref Range Status   Specimen Description   Final    BLOOD LEFT HAND Performed at [place] [address] [place], [place] [address]    Special Requests   Final    BOTTLES DRAWN AEROBIC AND ANAEROBIC Blood Culture results may not be optimal due to an inadequate volume of blood received  in culture bottles Performed at [place] [address], [place], [place] [address]    Culture   Final    NO GROWTH 2 DAYS Performed at [place] [address], [place], [place] [address]    Report Status PENDING  Incomplete  Culture, blood (routine x 2)     Status: None (Preliminary result)   Collection Time: 07/03/20  4:12 AM   Specimen: BLOOD  Result Value Ref Range Status   Specimen Description   Final    BLOOD BLOOD LEFT HAND Performed at [place] [address], [place], [place] [address]    Special Requests   Final  BOTTLES DRAWN AEROBIC ONLY Blood Culture results may not be optimal due to an inadequate volume of blood received in culture bottles Performed at [facility], [place] [address] [place], [place] [address]    Culture   Final    NO GROWTH < 24 HOURS Performed at [place] [address] [place], [place] [address]    Report Status PENDING  Incomplete     Labs: BNP (last 3 results) No results for input(s): BNP in the last 8760 hours. Basic Metabolic Panel: Recent Labs  Lab 07/01/20 1433 07/02/20 0056 07/03/20 0412  NA 139 137 137  K 3.2* 3.9 3.4*  CL 104 100 101  CO2 _0 GLUCOSE 162* 190* 106*  BUN _1 CREATININE 1.19 1.45* 1.45*  CALCIUM 9.0 8.5* 8.3*  MG  --   --  1.5*   Liver Function Tests: Recent Labs  Lab 07/01/20 1433 07/02/20 0056  AST 20 18  ALT 26 23  ALKPHOS 70 65  BILITOT 0.8 0.4  PROT 7.4 6.6  ALBUMIN 3.7 3.3*   Recent Labs  Lab 07/01/20 1433  LIPASE 32   No results for input(s): AMMONIA in the last 168 hours. CBC: Recent Labs  Lab 07/01/20 1433 07/02/20 0056 07/03/20 0412  WBC 24.9* 17.8* 14.2*  HGB 17.0 18.6* 15.4  HCT 50.5 54.9* 46.3  MCV 92.0 91.5 92.4  PLT 318 295 239   Cardiac Enzymes: No results for input(s): CKTOTAL, CKMB, CKMBINDEX, TROPONINI in the last 168 hours. BNP: Invalid input(s):  POCBNP CBG: Recent Labs  Lab 07/03/20 0739 07/03/20 1125  GLUCAP 116* 164*   D-Dimer No results for input(s): DDIMER in the last 72 hours. Hgb A1c No results for input(s): HGBA1C in the last 72 hours. Lipid Profile No results for input(s): CHOL, HDL, LDLCALC, TRIG, CHOLHDL, LDLDIRECT in the last 72 hours. Thyroid function studies No results for input(s): TSH, T4TOTAL, T3FREE, THYROIDAB in the last 72 hours.  Invalid input(s): FREET3 Anemia work up No results for input(s): VITAMINB12, FOLATE, FERRITIN, TIBC, IRON, RETICCTPCT in the last 72 hours. Urinalysis    Component Value Date/Time   COLORURINE AMBER (A) 07/01/2020 1348   APPEARANCEUR HAZY (A) 07/01/2020 1348   LABSPEC 1.020 07/01/2020 1348   PHURINE 5.0 07/01/2020 1348   GLUCOSEU NEGATIVE 07/01/2020 1348   GLUCOSEU NEGATIVE 04/14/2015 1717   HGBUR NEGATIVE 07/01/2020 1348   BILIRUBINUR SMALL (A) 07/01/2020 1348   BILIRUBINUR negative 06/20/2020 1244   KETONESUR 5 (A) 07/01/2020 1348   PROTEINUR 30 (A) 07/01/2020 1348   UROBILINOGEN 0.2 06/20/2020 1244   UROBILINOGEN 0.2 04/14/2015 1717   NITRITE NEGATIVE 07/01/2020 1348   LEUKOCYTESUR NEGATIVE 07/01/2020 1348   Sepsis Labs Invalid input(s): PROCALCITONIN,  WBC,  LACTICIDVEN Microbiology Recent Results (from the past 240 hour(s))  Novel Coronavirus, NAA ([facility])     Status: None   Collection Time: 06/28/20 11:33 AM   Specimen: Nasopharyngeal(NP) swabs in vial transport medium  Result Value Ref Range Status   SARS-CoV-2, NAA Not Detected Not Detected Final    Comment: This nucleic acid amplification test was developed and its performance characteristics determined by [employer], [employer] and [employer]. Nucleic acid amplification tests include RT-PCR and TMA. This test has not been FDA cleared or approved. This test has been authorized by FDA under an Emergency Use Authorization (EUA). This test is only authorized for the duration of time the declaration that circumstances  exist justifying the authorization of the emergency use of in  vitro diagnostic tests for detection of SARS-CoV-2 virus and/or diagnosis of COVID-19 infection under section 564(b)(1) of the Act, 21 U.S.C. 412INO-6(V) (1), unless the authorization is terminated or revoked sooner. When diagnostic testing is negative, the possibility of a false negative result should be considered in the context of a patient's recent exposures and the presence of clinical signs and symptoms consistent with COVID-19. An individual without symptoms of COVID-19 and who is not shedding SARS-CoV-2 virus wo uld expect to have a negative (not detected) result in this assay.   SARS-COV-2, NAA 2 DAY TAT     Status: None   Collection Time: 06/28/20 11:33 AM  Result Value Ref Range Status   SARS-CoV-2, NAA 2 DAY TAT Performed  Final  Resp Panel by RT-PCR (Flu A&B, Covid) Nasopharyngeal Swab     Status: None   Collection Time: 07/01/20  3:06 PM   Specimen: Nasopharyngeal Swab; Nasopharyngeal(NP) swabs in vial transport medium  Result Value Ref Range Status   SARS Coronavirus 2 by RT PCR NEGATIVE NEGATIVE Final    Comment: (NOTE) SARS-CoV-2 target nucleic acids are NOT DETECTED.  The SARS-CoV-2 RNA is generally detectable in upper respiratory specimens during the acute phase of infection. The lowest concentration of SARS-CoV-2 viral copies this assay can detect is 138 copies/mL. A negative result does not preclude SARS-Cov-2 infection and should not be used as the sole basis for treatment or other patient management decisions. A negative result may occur with  improper specimen collection/handling, submission of specimen other than nasopharyngeal swab, presence of viral mutation(s) within the areas targeted by this assay, and inadequate number of viral copies(<138 copies/mL). A negative result must be combined with clinical observations, patient history, and epidemiological information. The expected result is  Negative.  Fact Sheet for Patients:  EntrepreneurPulse.com.au  Fact Sheet for Healthcare Providers:  IncredibleEmployment.be  This test is no t yet approved or cleared by the [place] FDA and  has been authorized for detection and/or diagnosis of SARS-CoV-2 by FDA under an Emergency Use Authorization (EUA). This EUA will remain  in effect (meaning this test can be used) for the duration of the COVID-19 declaration under Section 564(b)(1) of the Act, 21 U.S.C.section 360bbb-3(b)(1), unless the authorization is terminated  or revoked sooner.       Influenza A by PCR NEGATIVE NEGATIVE Final   Influenza B by PCR NEGATIVE NEGATIVE Final    Comment: (NOTE) The Xpert Xpress SARS-CoV-2/FLU/RSV plus assay is intended as an aid in the diagnosis of influenza from Nasopharyngeal swab specimens and should not be used as a sole basis for treatment. Nasal washings and aspirates are unacceptable for Xpert Xpress SARS-CoV-2/FLU/RSV testing.  Fact Sheet for Patients: EntrepreneurPulse.com.au  Fact Sheet for Healthcare Providers: IncredibleEmployment.be  This test is not yet approved or cleared by the [place] FDA and has been authorized for detection and/or diagnosis of SARS-CoV-2 by FDA under an Emergency Use Authorization (EUA). This EUA will remain in effect (meaning this test can be used) for the duration of the COVID-19 declaration under Section 564(b)(1) of the Act, 21 U.S.C. section 360bbb-3(b)(1), unless the authorization is terminated or revoked.  Performed at [facility], [place] [address] [place], [place] [address]   C Difficile Quick Screen (NO PCR Reflex)     Status: None   Collection Time: 07/01/20  4:06 PM  Result Value Ref Range Status   C Diff antigen NEGATIVE NEGATIVE Final   C Diff toxin NEGATIVE NEGATIVE Final   C  Diff interpretation No C. difficile detected.  Final     Comment: Performed at [facility], [place] [address], [place], [place] [address]  Gastrointestinal Panel by PCR , Stool     Status: Abnormal   Collection Time: 07/01/20  4:07 PM  Result Value Ref Range Status   Campylobacter species NOT DETECTED NOT DETECTED Final   Plesimonas shigelloides NOT DETECTED NOT DETECTED Final   Salmonella species NOT DETECTED NOT DETECTED Final   Yersinia enterocolitica NOT DETECTED NOT DETECTED Final   Vibrio species NOT DETECTED NOT DETECTED Final   Vibrio cholerae NOT DETECTED NOT DETECTED Final   Enteroaggregative E coli (EAEC) NOT DETECTED NOT DETECTED Final   Enteropathogenic E coli (EPEC) NOT DETECTED NOT DETECTED Final   Enterotoxigenic E coli (ETEC) NOT DETECTED NOT DETECTED Final   Shiga like toxin producing E coli (STEC) NOT DETECTED NOT DETECTED Final   Shigella/Enteroinvasive E coli (EIEC) NOT DETECTED NOT DETECTED Final   Cryptosporidium NOT DETECTED NOT DETECTED Final   Cyclospora cayetanensis NOT DETECTED NOT DETECTED Final   Entamoeba histolytica NOT DETECTED NOT DETECTED Final   Giardia lamblia NOT DETECTED NOT DETECTED Final   Adenovirus F40/41 NOT DETECTED NOT DETECTED Final   Astrovirus NOT DETECTED NOT DETECTED Final   Norovirus GI/GII DETECTED (A) NOT DETECTED Final    Comment: RESULT CALLED TO, READ BACK BY AND VERIFIED WITH: DANA GUNDLACH AT 1625 07/02/20.PMF    Rotavirus A NOT DETECTED NOT DETECTED Final   Sapovirus (I, II, IV, and V) NOT DETECTED NOT DETECTED Final    Comment: Performed at [facility], [address] [place], [place] [address]  Culture, blood (routine x 2)     Status: None (Preliminary result)   Collection Time: 07/01/20  7:27 PM   Specimen: BLOOD LEFT HAND  Result Value Ref Range Status   Specimen Description   Final    BLOOD LEFT HAND Performed at [facility] [address] [place], [place] [address]    Special Requests   Final    BOTTLES DRAWN AEROBIC AND  ANAEROBIC Blood Culture results may not be optimal due to an inadequate volume of blood received in culture bottles Performed at [facility] [address] [place], [place] [address]    Culture   Final    NO GROWTH 2 DAYS Performed at [facility] [address], [place], [place] [address]    Report Status PENDING  Incomplete  Culture, blood (routine x 2)     Status: None (Preliminary result)   Collection Time: 07/03/20  4:12 AM   Specimen: BLOOD  Result Value Ref Range Status   Specimen Description   Final    BLOOD BLOOD LEFT HAND Performed at [facility] [address] [place], [place] [address]    Special Requests   Final    BOTTLES DRAWN AEROBIC ONLY Blood Culture results may not be optimal due to an inadequate volume of blood received in culture bottles Performed at [facility] [address] [place], [place] [address]    Culture   Final    NO GROWTH < 24 HOURS Performed at [facility] [address], [place], [place] [address]    Report Status PENDING  Incomplete     Time coordinating discharge: 38 minutes  SIGNED:   Georgette Shell, MD  [facility] 07/04/2020, 8:55 AM

## 2020-07-07 ENCOUNTER — Ambulatory Visit (HOSPITAL_COMMUNITY)
Admission: RE | Admit: 2020-07-07 | Discharge: 2020-07-07 | Disposition: A | Discharge status: Home / Self Care | Payer: Medicare Other | Source: Ambulatory Visit | Attending: Internal Medicine | Admitting: Internal Medicine

## 2020-07-07 ENCOUNTER — Other Ambulatory Visit: Payer: Self-pay

## 2020-07-07 DIAGNOSIS — I739 Peripheral vascular disease, unspecified: Secondary | ICD-10-CM | POA: Insufficient documentation

## 2020-07-07 LAB — CULTURE, BLOOD (ROUTINE X 2): Culture: NO GROWTH

## 2020-07-08 LAB — CULTURE, BLOOD (ROUTINE X 2): Culture: NO GROWTH

## 2020-07-09 ENCOUNTER — Emergency Department (HOSPITAL_COMMUNITY)
Admission: EM | Admit: 2020-07-09 | Discharge: 2020-07-10 | Disposition: A | Discharge status: Home / Self Care | Payer: Medicare Other | Attending: Emergency Medicine | Admitting: Emergency Medicine

## 2020-07-09 ENCOUNTER — Other Ambulatory Visit: Payer: Self-pay

## 2020-07-09 ENCOUNTER — Encounter (HOSPITAL_COMMUNITY): Payer: Self-pay

## 2020-07-09 DIAGNOSIS — R112 Nausea with vomiting, unspecified: Secondary | ICD-10-CM | POA: Diagnosis not present

## 2020-07-09 DIAGNOSIS — Z79899 Other long term (current) drug therapy: Secondary | ICD-10-CM | POA: Diagnosis not present

## 2020-07-09 DIAGNOSIS — E039 Hypothyroidism, unspecified: Secondary | ICD-10-CM | POA: Insufficient documentation

## 2020-07-09 DIAGNOSIS — R110 Nausea: Secondary | ICD-10-CM | POA: Diagnosis not present

## 2020-07-09 DIAGNOSIS — R109 Unspecified abdominal pain: Secondary | ICD-10-CM | POA: Diagnosis not present

## 2020-07-09 DIAGNOSIS — E1165 Type 2 diabetes mellitus with hyperglycemia: Secondary | ICD-10-CM | POA: Insufficient documentation

## 2020-07-09 DIAGNOSIS — R1110 Vomiting, unspecified: Secondary | ICD-10-CM | POA: Diagnosis not present

## 2020-07-09 DIAGNOSIS — R1084 Generalized abdominal pain: Secondary | ICD-10-CM | POA: Diagnosis not present

## 2020-07-09 DIAGNOSIS — R197 Diarrhea, unspecified: Secondary | ICD-10-CM | POA: Diagnosis not present

## 2020-07-09 LAB — CBC WITH DIFFERENTIAL/PLATELET
Abs Immature Granulocytes: 0.17 10*3/uL — ABNORMAL HIGH (ref 0.00–0.07)
Basophils Absolute: 0.1 10*3/uL (ref 0.0–0.1)
Basophils Relative: 0 %
Eosinophils Absolute: 3.1 10*3/uL — ABNORMAL HIGH (ref 0.0–0.5)
Eosinophils Relative: 17 %
HCT: 46.4 % (ref 39.0–52.0)
Hemoglobin: 15.8 g/dL (ref 13.0–17.0)
Immature Granulocytes: 1 %
Lymphocytes Relative: 9 %
Lymphs Abs: 1.7 10*3/uL (ref 0.7–4.0)
MCH: 31.3 pg (ref 26.0–34.0)
MCHC: 34.1 g/dL (ref 30.0–36.0)
MCV: 92.1 fL (ref 80.0–100.0)
Monocytes Absolute: 1.2 10*3/uL — ABNORMAL HIGH (ref 0.1–1.0)
Monocytes Relative: 6 %
Neutro Abs: 12.2 10*3/uL — ABNORMAL HIGH (ref 1.7–7.7)
Neutrophils Relative %: 67 %
Platelets: 251 10*3/uL (ref 150–400)
RBC: 5.04 MIL/uL (ref 4.22–5.81)
RDW: 13.3 % (ref 11.5–15.5)
WBC: 18.5 10*3/uL — ABNORMAL HIGH (ref 4.0–10.5)
nRBC: 0 % (ref 0.0–0.2)

## 2020-07-09 LAB — COMPREHENSIVE METABOLIC PANEL
ALT: 32 U/L (ref 0–44)
AST: 26 U/L (ref 15–41)
Albumin: 3.2 g/dL — ABNORMAL LOW (ref 3.5–5.0)
Alkaline Phosphatase: 56 U/L (ref 38–126)
Anion gap: 11 (ref 5–15)
BUN: 13 mg/dL (ref 8–23)
CO2: 19 mmol/L — ABNORMAL LOW (ref 22–32)
Calcium: 8 mg/dL — ABNORMAL LOW (ref 8.9–10.3)
Chloride: 107 mmol/L (ref 98–111)
Creatinine, Ser: 0.98 mg/dL (ref 0.61–1.24)
GFR, Estimated: >60 mL/min (ref >60)
Glucose, Bld: 140 mg/dL — ABNORMAL HIGH (ref 70–99)
Potassium: 3.6 mmol/L (ref 3.5–5.1)
Sodium: 137 mmol/L (ref 135–145)
Total Bilirubin: 0.9 mg/dL (ref 0.3–1.2)
Total Protein: 6.2 g/dL — ABNORMAL LOW (ref 6.5–8.1)

## 2020-07-09 LAB — LIPASE, BLOOD: Lipase: 40 U/L (ref 11–51)

## 2020-07-09 MED ORDER — SODIUM CHLORIDE 0.9 % IV BOLUS
1000.0000 mL | Freq: Once | INTRAVENOUS | Status: AC
  Administered 2020-07-10: 1000 mL via INTRAVENOUS

## 2020-07-09 MED ORDER — LOPERAMIDE HCL 2 MG PO CAPS
2.0000 mg | ORAL_CAPSULE | Freq: Once | ORAL | Status: AC
  Administered 2020-07-10: 2 mg via ORAL
  Filled 2020-07-09: qty 1

## 2020-07-09 MED ORDER — DICYCLOMINE HCL 10 MG PO CAPS
10.0000 mg | ORAL_CAPSULE | Freq: Once | ORAL | Status: AC
  Administered 2020-07-10: 10 mg via ORAL
  Filled 2020-07-09: qty 1

## 2020-07-09 NOTE — ED Triage Notes (Signed)
Patient arrived stating that he was discharged from the hospital on [place]. States that he has started having NVD again and lower abdominal pain. Reports last taking something for nausea (unsure what at this time) 6 hours ago.

## 2020-07-10 ENCOUNTER — Emergency Department (HOSPITAL_COMMUNITY): Payer: Medicare Other

## 2020-07-10 ENCOUNTER — Encounter (HOSPITAL_COMMUNITY): Payer: Self-pay

## 2020-07-10 DIAGNOSIS — R1110 Vomiting, unspecified: Secondary | ICD-10-CM | POA: Diagnosis not present

## 2020-07-10 DIAGNOSIS — R109 Unspecified abdominal pain: Secondary | ICD-10-CM | POA: Diagnosis not present

## 2020-07-10 LAB — GASTROINTESTINAL PANEL BY PCR, STOOL (REPLACES STOOL CULTURE)

## 2020-07-10 LAB — C DIFFICILE QUICK SCREEN W PCR REFLEX
C Diff antigen: NEGATIVE
C Diff interpretation: NOT DETECTED
C Diff toxin: NEGATIVE

## 2020-07-10 MED ORDER — LOPERAMIDE HCL 2 MG PO CAPS: 2.0000 mg | ORAL_CAPSULE | Freq: Four times a day (QID) | ORAL | 0 refills | Status: DC | PRN

## 2020-07-10 MED ORDER — PROMETHAZINE HCL 25 MG PO TABS
12.5000 mg | ORAL_TABLET | Freq: Once | ORAL | Status: AC
  Administered 2020-07-10: 06:00:00 12.5 mg via ORAL
  Filled 2020-07-10: qty 1

## 2020-07-10 MED ORDER — IOHEXOL 300 MG/ML  SOLN
100.0000 mL | Freq: Once | INTRAMUSCULAR | Status: AC | PRN
  Administered 2020-07-10: 100 mL via INTRAVENOUS

## 2020-07-10 MED ORDER — ONDANSETRON HCL 4 MG PO TABS: 4.0000 mg | ORAL_TABLET | Freq: Three times a day (TID) | ORAL | 0 refills | Status: DC | PRN

## 2020-07-10 MED ORDER — DICYCLOMINE HCL 20 MG PO TABS: 20.0000 mg | ORAL_TABLET | Freq: Two times a day (BID) | ORAL | 0 refills | Status: DC

## 2020-07-10 MED ORDER — PROMETHAZINE HCL 25 MG PO TABS: 12.5000 mg | ORAL_TABLET | Freq: Three times a day (TID) | ORAL | 0 refills | Status: DC | PRN

## 2020-07-10 NOTE — ED Provider Notes (Signed)
[facility] DEPT Provider Note   CSN: [id number] Arrival date & time: 07/09/20  2027     History Chief Complaint  Patient presents with  . Abdominal Pain  . Diarrhea    Connor Becker. is a 69 y.o. male presented for evaluation of abdominal pain and diarrhea.  Patient states he was recently admitted for the same.  He he was feeling better upon discharge, however yesterday he started to feel poorly again.  He reports he is having diarrhea every hour.  It is still brown, nonbloody.  He also reports diffuse abdominal tenderness.  He has associated nausea, no vomiting.  He denies fevers, chills, chest pain, shortness of breath, cough,  He has decreased urination.  He has not had anything to eat or drink today, as this increases his nausea.  He was not discharged on any antibiotics, is not currently taking anything for his symptoms.  Additional history obtained from chart review.  Patient with a history of diabetes, GERD, hyperlipidemia.  I reviewed patient's recent admission.  He was ultimately diagnosed with norovirus, symptoms improved with fluids and Imodium. Pt did receive broad spectrum abx initially during admission.   HPI     Past Medical History:  Diagnosis Date  . ALLERGIC RHINITIS   . Chronic pain of both knees   . DJD (degenerative joint disease) of knee    right  . DM2 (diabetes mellitus, type 2) ([place])   . GERD   . Glaucoma   . History of nuclear stress test    Myoview 12/17: EF 53, no ischemia, Low Risk  . HYPERLIPIDEMIA    myalgias from Lipitor  . HYPOGONADISM, MALE   . Hypothyroidism   . INSOMNIA-SLEEP DISORDER-UNSPEC   . Joint pain   . Obesity   . OSTEOARTHRITIS   . SLEEP APNEA, OBSTRUCTIVE    no CPAP; repeat sleep test pending 07/2016  . Unspecified hypothyroidism 12/04/2013  . Vitamin D deficiency     Patient Active Problem List   Diagnosis Date Noted  . Diarrhea of presumed infectious origin   . Sepsis ([place])  07/01/2020  . Dyslipidemia 04/13/2020  . Type 2 diabetes mellitus with hyperglycemia, without long-term current use of insulin ([place]) 04/13/2020  . Insulin resistance 07/01/2018  . Orthostatic hypotension 05/19/2018  . Type 2 diabetes mellitus with hyperglycemia ([place]) 05/19/2018  . Vitamin D deficiency disease 05/19/2018  . Obstructive sleep apnea 05/19/2018  . Glaucoma 05/19/2018  . Class 3 severe obesity with serious comorbidity and body mass index (BMI) of 40.0 to 44.9 in adult ([place]) 05/19/2018  . Fatigue 02/01/2016  . Dizziness 02/01/2016  . PSA elevation 11/17/2015  . Osteoarthrosis involving lower leg 11/17/2015  . Ulnar neuritis 04/14/2015  . Acute sinus infection 04/27/2014  . Primary hypothyroidism 12/04/2013  . Morbid obesity ([place]) 12/04/2013  . Right shoulder pain 10/31/2010  . Preventative health care 10/28/2010  . Diabetes ([place]) 10/26/2010  . INSOMNIA-SLEEP DISORDER-UNSPEC 07/11/2007  . GERD 07/11/2007  . Hypogonadism in male 04/06/2007  . Mixed hyperlipidemia 04/06/2007  . ERECTILE DYSFUNCTION 04/06/2007  . SLEEP APNEA, OBSTRUCTIVE 04/06/2007  . Allergic rhinitis 04/06/2007  . OSTEOARTHRITIS 04/06/2007    Past Surgical History:  Procedure Laterality Date  . NASAL SEPTUM SURGERY    . TONSILLECTOMY    . UVULOPALATOPHARYNGOPLASTY    . VASECTOMY         Family History  Problem Relation Age of Onset  . Diabetes Maternal Uncle   . Heart attack Maternal Uncle   .  Hypertension Other   . Prostate cancer Other   . Heart disease Other   . Heart attack Mother 79       s/p MI  . Other Mother        pacemaker  . Diabetes Mother   . High blood pressure Mother   . High Cholesterol Mother   . Thyroid disease Mother   . Obesity Mother   . Macular degeneration Father   . Thyroid disease Father   . Colon cancer Neg Hx     Social History   Tobacco Use  . Smoking status: Never Smoker  . Smokeless tobacco: Never Used  Vaping Use  . Vaping Use: Never used   Substance Use Topics  . Alcohol use: Not Currently    Comment: occassional beer  . Drug use: No    Home Medications Prior to Admission medications   Medication Sig Start Date End Date Taking? Authorizing Provider  latanoprost (XALATAN) 0.005 % ophthalmic solution Place 1 drop into both eyes daily.   Yes [provider]  levothyroxine (SYNTHROID) 112 MCG tablet Take 1 tablet by mouth once daily Patient taking differently: Take 112 mcg by mouth daily before breakfast. 04/04/20  Yes [name], MD  Magnesium 400 MG CAPS Take 400 mg by mouth at bedtime. 03/11/19  Yes [name], MD  methocarbamol (ROBAXIN) 750 MG tablet Take 750 mg by mouth 4 (four) times daily as needed for muscle spasms.   Yes [provider]  Multiple Vitamins-Minerals (MULTIVITAMIN/EXTRA VITAMIN D3) CHEW Chew 1 tablet by mouth daily.   Yes [provider]  omeprazole (PRILOSEC) 20 MG capsule Take 20 mg by mouth daily as needed (acid reflux).   Yes [provider]  ONETOUCH VERIO test strip USE STRIP TO CHECK BLOOD SUGAR UP TO 4 TIMES PER DAY 06/04/18  Yes [name], MD  pseudoephedrine (SUDAFED) 30 MG tablet Take 60 mg by mouth 2 (two) times daily as needed for congestion.   Yes [provider]  Semaglutide (RYBELSUS) 3 MG TABS Take 3 mg by mouth daily with breakfast. 04/13/20  Yes Shamleffer, Konrad Dolores, MD  tadalafil (CIALIS) 5 MG tablet Take 5 mg by mouth daily. 04/21/18  Yes [provider]  testosterone enanthate (DELATESTRYL) 200 MG/ML injection Inject 200 mg into the muscle every 14 (fourteen) days.   Yes [provider]  timolol (TIMOPTIC) 0.5 % ophthalmic solution Place 1 drop into both eyes daily. 06/11/18  Yes [provider]  Turmeric 500 MG CAPS Take 500 mg by mouth daily.   Yes [provider]  vitamin C (ASCORBIC ACID) 500 MG tablet Take 1,000 mg by mouth daily.   Yes [provider]  Vitamin D,  Ergocalciferol, (DRISDOL) 1.25 MG (50000 UNIT) CAPS capsule Take one capsule by mouth on Tues/Fridays Patient taking differently: Take 50,000 Units by mouth See admin instructions. Take one capsule by mouth on Tues/Fridays 10/19/19  Yes [name], MD  dicyclomine (BENTYL) 20 MG tablet Take 1 tablet (20 mg total) by mouth 2 (two) times daily. 07/10/20   Epifania Littrell, PA-C  loperamide (IMODIUM) 2 MG capsule Take 1 capsule (2 mg total) by mouth 4 (four) times daily as needed for diarrhea or loose stools. 07/10/20   Nusrat Encarnacion, PA-C  promethazine (PHENERGAN) 25 MG tablet Take 0.5 tablets (12.5 mg total) by mouth every 8 (eight) hours as needed for nausea or vomiting. 07/10/20   Alder Murri, PA-C    Allergies    Horseradish [place]  rusticana ext (horseradish)], Lipitor [atorvastatin], Metformin and related, and Soy allergy  Review of Systems   Review of Systems  Gastrointestinal: Positive for abdominal pain, diarrhea and nausea.  All other systems reviewed and are negative.   Physical Exam Updated Vital Signs BP 125/75 (BP Location: Left Arm)   Pulse 89   Temp 98.8 F (37.1 C) (Oral)   Resp 14   SpO2 94%   Physical Exam Vitals and nursing note reviewed.  Constitutional:      General: He is not in acute distress.    Appearance: He is well-developed and well-nourished.     Comments: Appears nontoxic  HENT:     Head: Normocephalic and atraumatic.     Mouth/Throat:     Mouth: Mucous membranes are dry.     Comments: MM dry Eyes:     Extraocular Movements: EOM normal.     Conjunctiva/sclera: Conjunctivae normal.     Pupils: Pupils are equal, round, and reactive to light.  Cardiovascular:     Rate and Rhythm: Normal rate and regular rhythm.     Pulses: Normal pulses and intact distal pulses.  Pulmonary:     Effort: Pulmonary effort is normal. No respiratory distress.     Breath sounds: Normal breath sounds. No wheezing.  Abdominal:     General: There is no  distension.     Palpations: Abdomen is soft. There is no mass.     Tenderness: There is abdominal tenderness. There is no guarding or rebound.     Comments: Diffuse mild tenderness palpation of the abdomen.  No rigidity, guarding, distention.  Negative rebound.  Peritonitis.  Worse in the lower abdomen  Musculoskeletal:        General: Normal range of motion.     Cervical back: Normal range of motion and neck supple.  Skin:    General: Skin is warm and dry.     Capillary Refill: Capillary refill takes less than 2 seconds.  Neurological:     Mental Status: He is alert and oriented to person, place, and time.  Psychiatric:        Mood and Affect: Mood and affect normal.     ED Results / Procedures / Treatments   Labs (all labs ordered are listed, but only abnormal results are displayed) Labs Reviewed  CBC WITH DIFFERENTIAL/PLATELET - Abnormal; Notable for the following components:      Result Value   WBC 18.5 (*)    Neutro Abs 12.2 (*)    Monocytes Absolute 1.2 (*)    Eosinophils Absolute 3.1 (*)    Abs Immature Granulocytes 0.17 (*)    All other components within normal limits  COMPREHENSIVE METABOLIC PANEL - Abnormal; Notable for the following components:   CO2 19 (*)    Glucose, Bld 140 (*)    Calcium 8.0 (*)    Total Protein 6.2 (*)    Albumin 3.2 (*)    All other components within normal limits  C DIFFICILE QUICK SCREEN W PCR REFLEX  GASTROINTESTINAL PANEL BY PCR, STOOL (REPLACES STOOL CULTURE)  LIPASE, BLOOD    EKG None  Radiology CT ABDOMEN PELVIS W CONTRAST  Result Date: 07/10/2020 CLINICAL DATA:  Abdominal pain and fever. Nausea, vomiting, diarrhea, lower abdominal pain. EXAM: CT ABDOMEN AND PELVIS WITH CONTRAST TECHNIQUE: Multidetector CT imaging of the abdomen and pelvis was performed using the standard protocol following bolus administration of intravenous contrast. CONTRAST:  16mL OMNIPAQUE IOHEXOL 300 MG/ML  SOLN COMPARISON:  CT abdomen pelvis  07/01/2020  FINDINGS: Lower chest: No acute abnormality. Hepatobiliary: Interval resolution of gas within the portal triads. The hepatic parenchyma is diffusely hypodense compared to the splenic parenchyma consistent with fatty infiltration. No focal liver abnormality. No gallstones, gallbladder wall thickening, or pericholecystic fluid. No biliary dilatation. Pancreas: No focal lesion. Normal pancreatic contour. No surrounding inflammatory changes. No main pancreatic ductal dilatation. Spleen: Normal in size without focal abnormality. Adrenals/Urinary Tract: No adrenal nodule bilaterally. Bilateral kidneys enhance symmetrically. No hydronephrosis. No hydroureter. On delayed imaging, there is no urothelial wall thickening and there are no filling defects in the opacified portions of the bilateral collecting systems or ureters. The urinary bladder is unremarkable. Stomach/Bowel: Stomach is within normal limits. No evidence of bowel wall thickening or dilatation. Fat density lesion within the wall of the transverse colon likely represents a lipomatous lesion (7:38, 6:87). Fluid within the lumen of the large bowel. Scattered colonic diverticulosis with no acute diverticulitis. No pneumatosis. The appendix not definitely identified. Vascular/Lymphatic: No abdominal aorta or iliac aneurysm. Mild atherosclerotic plaque of the aorta and its branches. No abdominal, pelvic, or inguinal lymphadenopathy. Reproductive: Prominent prostate. Other: No intraperitoneal free fluid. No intraperitoneal free gas. No organized fluid collection. Musculoskeletal: No abdominal wall hernia or abnormality No suspicious lytic or blastic osseous lesions. No acute displaced fracture. Multilevel moderate to severe degenerative changes of the spine. IMPRESSION: 1. Interval resolution of suggestion of portal venous gas or gastric pneumatosis. 2. Fluid within the colonic lumen suggestive of a fast transition state. 3. Scattered colonic diverticulosis with no  acute diverticulitis. 4. Intramural lipoma of the proximal transverse colon. 5.  Aortic Atherosclerosis (ICD10-I70.0). Electronically Signed   By: Tish Frederickson M.D.   On: 07/10/2020 01:08    Procedures Procedures (including critical care time)  Medications Ordered in ED Medications  sodium chloride 0.9 % bolus 1,000 mL (0 mLs Intravenous Stopped 07/10/20 0115)  dicyclomine (BENTYL) capsule 10 mg (10 mg Oral Given 07/10/20 0010)  loperamide (IMODIUM) capsule 2 mg (2 mg Oral Given 07/10/20 0010)  iohexol (OMNIPAQUE) 300 MG/ML solution 100 mL (100 mLs Intravenous Contrast Given 07/10/20 0026)  promethazine (PHENERGAN) tablet 12.5 mg (12.5 mg Oral Given 07/10/20 0623)    ED Course  I have reviewed the triage vital signs and the nursing notes.  Pertinent labs & imaging results that were available during my care of the patient were reviewed by me and considered in my medical decision making (see chart for details).    MDM Rules/Calculators/A&P                          Patient presenting for evaluation of abdominal pain, diarrhea, nausea.  On exam, he appears nontoxic.  His blood pressure is soft, in the low 100s, however he is not tachycardic or febrile.  He does not meet sepsis criteria.  He was recently admitted and diagnosed with norovirus, this is likely residual symptoms.  However considering patient's age and worsening symptoms, we will will recheck labs.  If abnormal, consider need for repeat CT scan.  Labs interpreted by me, shows leukocytosis of 18.  This is up from when he was discharged.  As such, obtain CT scan.  Otherwise electrolytes are stable.  CT negative for acute findings or infection.  C. Difficile negative.  On reassessment, patient reports symptoms are improved.  Blood pressure is better after fluids.  Discussed with patient continued symptomatic treatment, will discharge on Imodium.  At this time, patient appears safe  for discharge.  Return precautions given.  Patient  states he understands and agrees to plan.  Final Clinical Impression(s) / ED Diagnoses Final diagnoses:  Diarrhea, unspecified type  Nausea  Abdominal pain, unspecified abdominal location    Rx / DC Orders ED Discharge Orders         Ordered    loperamide (IMODIUM) 2 MG capsule  4 times daily PRN,   Status:  Discontinued        07/10/20 0534    ondansetron (ZOFRAN) 4 MG tablet  Every 8 hours PRN,   Status:  Discontinued        07/10/20 0534    dicyclomine (BENTYL) 20 MG tablet  2 times daily        07/10/20 0534    promethazine (PHENERGAN) 25 MG tablet  Every 8 hours PRN        07/10/20 0550    loperamide (IMODIUM) 2 MG capsule  4 times daily PRN        07/10/20 0550           Alveria Apley, PA-C 07/10/20 0711    Molpus, Jonny Ruiz, MD 07/10/20 [id number]

## 2020-07-10 NOTE — Discharge Instructions (Addendum)
Use a half tablet of phenergan as needed for nausea or vomiting. If you need further nausea control, you may try a full tablet as needed.  Use Tylenol ibuprofen as needed for abdominal pain.  You may also use Bentyl as needed for abdominal pain or spasm. Take Imodium as needed to decrease bowel movements. Make sure you are staying well-hydrated water. Slowly progress your diet from liquids to solid foods.  Eat a bland diet, avoid spicy, greasy, or acidic foods. Follow-up with your primary care doctor as scheduled later this week. Return to the emergency room if you develop fevers, persistent vomiting, severe worsening pain, new blood in your stool, or any new, worsening, or concerning symptoms.

## 2020-07-14 ENCOUNTER — Other Ambulatory Visit: Payer: Self-pay

## 2020-07-14 ENCOUNTER — Ambulatory Visit (INDEPENDENT_AMBULATORY_CARE_PROVIDER_SITE_OTHER): Payer: Medicare Other | Admitting: Nurse Practitioner

## 2020-07-14 ENCOUNTER — Encounter: Payer: Self-pay | Admitting: Nurse Practitioner

## 2020-07-14 VITALS — BP 124/68 | HR 72 | Temp 98.0°F | Ht 73.0 in | Wt 297.4 lb

## 2020-07-14 DIAGNOSIS — T7840XA Allergy, unspecified, initial encounter: Secondary | ICD-10-CM | POA: Diagnosis not present

## 2020-07-14 DIAGNOSIS — A419 Sepsis, unspecified organism: Secondary | ICD-10-CM | POA: Diagnosis not present

## 2020-07-14 DIAGNOSIS — L509 Urticaria, unspecified: Secondary | ICD-10-CM

## 2020-07-14 MED ORDER — PREDNISONE 5 MG PO TABS
ORAL_TABLET | ORAL | 0 refills | Status: DC
Start: 2020-07-14 — End: 2021-03-01

## 2020-07-14 MED ORDER — EPINEPHRINE 0.3 MG/0.3ML IJ SOAJ: 0.3000 mg | INTRAMUSCULAR | 0 refills | Status: DC | PRN

## 2020-07-14 NOTE — Progress Notes (Signed)
I,Tianna Badgett,acting as a Education administrator for [employer], NP.,have documented all relevant documentation on the behalf of [employer], NP,as directed by  Bary Castilla, NP while in the presence of Bary Castilla, NP.  This visit occurred during the SARS-CoV-2 public health emergency.  Safety protocols were in place, including screening questions prior to the visit, additional usage of staff PPE, and extensive cleaning of exam room while observing appropriate contact time as indicated for disinfecting solutions.  Subjective:     Patient ID: Connor Becker , male    DOB: [birth year] , 69 y.o.   MRN: [id number]   Chief Complaint  Patient presents with   Hospitalization Follow-up    HPI  Patient was recently hospitalized for diarrhea and sepsis. He was treated and discharged home with imodium. Since discharge he has been drinking Pedialyte and since then has developed a rash all over his trunk/chest/stomach area. The rash started upon his discharge from the hospital. The patient states in the hospital he was given contrast for his CT and for which he was nauseated and vomited thereafter. He was also given antibiotics during the hospital stay for sepsis and norovirus. . Patient is unsure of what could be the trigger for his rash. The rash itches and he has been taking antihistamine which has helped him with his itching. The patient did not report any shortness of breath or swelling in his throat or face. He is diabetic and his blood sugar at home runs 130-150.      Rash This is a new problem. The current episode started in the past 7 days. The problem has been gradually worsening since onset. The affected locations include the torso, back, neck and abdomen. The rash is characterized by itchiness. It is unknown if there was an exposure to a precipitant. Pertinent negatives include no cough, facial edema, fatigue, fever or shortness of breath. Past treatments include anti-itch  cream and antihistamine. The treatment provided mild relief. His past medical history is significant for allergies. (Seasonal allergies )     Past Medical History:  Diagnosis Date   ALLERGIC RHINITIS    Chronic pain of both knees    DJD (degenerative joint disease) of knee    right   DM2 (diabetes mellitus, type 2) (HCC)    GERD    Glaucoma    History of nuclear stress test    Myoview 12/17: EF 53, no ischemia, Low Risk   HYPERLIPIDEMIA    myalgias from Lipitor   HYPOGONADISM, MALE    Hypothyroidism    INSOMNIA-SLEEP DISORDER-UNSPEC    Joint pain    Obesity    OSTEOARTHRITIS    SLEEP APNEA, OBSTRUCTIVE    no CPAP; repeat sleep test pending 07/2016   Unspecified hypothyroidism 12/04/2013   Vitamin D deficiency      Family History  Problem Relation Age of Onset   Diabetes Maternal Uncle    Heart attack Maternal Uncle    Hypertension Other    Prostate cancer Other    Heart disease Other    Heart attack Mother 28       s/p MI   Other Mother        pacemaker   Diabetes Mother    High blood pressure Mother    High Cholesterol Mother    Thyroid disease Mother    Obesity Mother    Macular degeneration Father    Thyroid disease Father    Colon cancer Neg Hx  Current Outpatient Medications:    EPINEPHrine 0.3 mg/0.3 mL IJ SOAJ injection, Inject 0.3 mg into the muscle as needed for anaphylaxis., Disp: 1 each, Rfl: 0   predniSONE (DELTASONE) 5 MG tablet, Take 20 mg on day 1, 10 mg on day 2 and 5 mg on day 3., Disp: 7 tablet, Rfl: 0   dicyclomine (BENTYL) 20 MG tablet, Take 1 tablet (20 mg total) by mouth 2 (two) times daily., Disp: 20 tablet, Rfl: 0   latanoprost (XALATAN) 0.005 % ophthalmic solution, Place 1 drop into both eyes daily., Disp: , Rfl:    levothyroxine (SYNTHROID) 112 MCG tablet, Take 1 tablet by mouth once daily (Patient taking differently: Take 112 mcg by mouth daily before breakfast.), Disp: 90 tablet, Rfl: 0    loperamide (IMODIUM) 2 MG capsule, Take 1 capsule (2 mg total) by mouth 4 (four) times daily as needed for diarrhea or loose stools., Disp: 12 capsule, Rfl: 0   Magnesium 400 MG CAPS, Take 400 mg by mouth at bedtime., Disp: 90 capsule, Rfl: 1   methocarbamol (ROBAXIN) 750 MG tablet, Take 750 mg by mouth 4 (four) times daily as needed for muscle spasms., Disp: , Rfl:    Multiple Vitamins-Minerals (MULTIVITAMIN/EXTRA VITAMIN D3) CHEW, Chew 1 tablet by mouth daily., Disp: , Rfl:    omeprazole (PRILOSEC) 20 MG capsule, Take 20 mg by mouth daily as needed (acid reflux)., Disp: , Rfl:    ONETOUCH VERIO test strip, USE STRIP TO CHECK BLOOD SUGAR UP TO 4 TIMES PER DAY, Disp: 200 each, Rfl: 11   promethazine (PHENERGAN) 25 MG tablet, Take 0.5 tablets (12.5 mg total) by mouth every 8 (eight) hours as needed for nausea or vomiting., Disp: 30 tablet, Rfl: 0   pseudoephedrine (SUDAFED) 30 MG tablet, Take 60 mg by mouth 2 (two) times daily as needed for congestion., Disp: , Rfl:    Semaglutide (RYBELSUS) 3 MG TABS, Take 3 mg by mouth daily with breakfast., Disp: 30 tablet, Rfl: 6   tadalafil (CIALIS) 5 MG tablet, Take 5 mg by mouth daily., Disp: , Rfl: 11   testosterone enanthate (DELATESTRYL) 200 MG/ML injection, Inject 200 mg into the muscle every 14 (fourteen) days., Disp: , Rfl:    timolol (TIMOPTIC) 0.5 % ophthalmic solution, Place 1 drop into both eyes daily., Disp: , Rfl: 2   Turmeric 500 MG CAPS, Take 500 mg by mouth daily., Disp: , Rfl:    vitamin C (ASCORBIC ACID) 500 MG tablet, Take 1,000 mg by mouth daily., Disp: , Rfl:    Vitamin D, Ergocalciferol, (DRISDOL) 1.25 MG (50000 UNIT) CAPS capsule, Take one capsule by mouth on Tues/Fridays (Patient taking differently: Take 50,000 Units by mouth See admin instructions. Take one capsule by mouth on Tues/Fridays), Disp: 24 capsule, Rfl: 0   Allergies  Allergen Reactions   Horseradish [Armoracia Rusticana Ext (Horseradish)]    Lipitor  [Atorvastatin] Other (See Comments)    Leg cramps   Metformin And Related Other (See Comments)    GI upset   Soy Allergy      Review of Systems  Constitutional: Negative.  Negative for fatigue and fever.  Respiratory: Negative.  Negative for cough, shortness of breath and wheezing.   Cardiovascular: Negative.  Negative for palpitations and leg swelling.  Gastrointestinal: Negative.   Skin: Positive for rash.       Upper body  Allergic/Immunologic: Positive for environmental allergies.       Seasonal allergies   Neurological: Negative.  Today's Vitals   07/14/20 1127  BP: 124/68  Pulse: 72  Temp: 98 F (36.7 C)  TempSrc: Oral  Weight: 297 lb 6.4 oz (134.9 kg)  Height: 6\' 1"  (1.854 m)   Body mass index is 39.24 kg/m.  Wt Readings from Last 3 Encounters:  07/14/20 297 lb 6.4 oz (134.9 kg)  07/01/20 (!) 302 lb (137 kg)  06/20/20 (!) 303 lb 6.4 oz (137.6 kg)    Objective:  Physical Exam Constitutional:      Appearance: Normal appearance.  HENT:     Head: Normocephalic and atraumatic.  Cardiovascular:     Rate and Rhythm: Normal rate and regular rhythm.     Pulses: Normal pulses.     Heart sounds: Normal heart sounds. No murmur heard.   Pulmonary:     Effort: Pulmonary effort is normal. No respiratory distress.     Breath sounds: Normal breath sounds. No wheezing or rales.  Skin:    General: Skin is warm and dry.     Findings: Rash present. Rash is urticarial.     Comments: Rash on his stomach, back, arms and legs  Neurological:     Mental Status: He is alert.  Psychiatric:        Mood and Affect: Mood normal.        Behavior: Behavior normal.        Thought Content: Thought content normal.        Judgment: Judgment normal.         Assessment And Plan:     1. Sepsis without acute organ dysfunction, due to unspecified organism (HCC) -Stable, follow up from hospital  -Reviewed meds and labs with patient  -Continue his current meds  2.  Hives -Acute rash since he is diabetic will start him on low dose prednisone and monitor  -Patient advised if rash gets worse or symptoms do not resolve to go the nearest emergency care.  -Continue taking OTC antihistamine as needed.   -will consider referral to allergist.  - predniSONE (DELTASONE) 5 MG tablet; Take 20 mg on day 1, 10 mg on day 2 and 5 mg on day 3.  Dispense: 7 tablet; Refill: 0  3. Allergic reaction, initial encounter -Patient given Epipen prescription incase he was to need it due to unknown allergy or if allergy symptoms were to get worse  -Patient educated to seek emergency care if he experiences any SOB, swelling in his throat or face or has difficulty breathing.  - EPINEPHrine 0.3 mg/0.3 mL IJ SOAJ injection; Inject 0.3 mg into the muscle as needed for anaphylaxis.  Dispense: 1 each; Refill: 0     Patient was given opportunity to ask questions. Patient verbalized understanding of the plan and was able to repeat key elements of the plan. All questions were answered to their satisfaction.  14/06/21, NP   I, Charlesetta Ivory, NP, have reviewed all documentation for this visit. The documentation on 07/14/20 for the exam, diagnosis, procedures, and orders are all accurate and complete.  THE PATIENT IS ENCOURAGED TO PRACTICE SOCIAL DISTANCING DUE TO THE COVID-19 PANDEMIC.

## 2020-07-14 NOTE — Patient Instructions (Signed)
Sepsis, Diagnosis, Adult Sepsis is a serious bodily reaction to an infection. The infection that triggers sepsis may be from a bacteria, virus, or fungus. Sepsis can result from an infection in any part of your body. Infections that commonly lead to sepsis include skin, lung, and urinary tract infections. Sepsis is a medical emergency that must be treated right away in a hospital. In severe cases, it can lead to septic shock. Septic shock can weaken your heart and cause your blood pressure to drop. This can cause your central nervous system and your body's organs to stop working. What are the causes? This condition is caused by a severe reaction to infections from bacteria, viruses, or fungus. The germs that most often lead to sepsis include:  Escherichia coli (E. coli) bacteria.  Staphylococcus aureus (staph) bacteria.  Some types of Streptococcus bacteria. The most common infections affect these organs:  The lung (pneumonia).  The kidneys or bladder (urinary tract infection).  The skin (cellulitis).  The bowel, gallbladder, or pancreas. What increases the risk? You are more likely to develop this condition if:  Your body's disease-fighting system (immune system) is weakened.  You are age 65 or older.  You are male.  You had surgery or you have been hospitalized.  You have these devices inserted into your body: ? A small, thin tube (catheter). ? IV line. ? Breathing tube. ? Drainage tube.  You are not getting enough nutrients from food (malnourished).  You have a long-term (chronic) disease, such as cancer, lung disease, kidney disease, or diabetes.  You are African American. What are the signs or symptoms? Symptoms of this condition may include:  Fever.  Chills or feeling very cold.  Confusion or anxiety.  Fatigue.  Muscle aches.  Shortness of breath.  Nausea and vomiting.  Urinating much less than usual.  Fast heart rate (tachycardia).  Rapid  breathing (hyperventilation).  Changes in skin color. Your skin may look blotchy, pale, or blue.  Cool, clammy, or sweaty skin.  Skin rash. Other symptoms depend on the source of your infection. How is this diagnosed? This condition is diagnosed based on:  Your symptoms.  Your medical history.  A physical exam. Other tests may also be done to find out the cause of the infection and how severe the sepsis is. These tests may include:  Blood tests.  Urine tests.  Swabs from other areas of your body that may have an infection. These samples may be tested (cultured) to find out what type of bacteria is causing the infection.  Chest X-ray to check for pneumonia. Other imaging tests, such as a CT scan, may also be done.  Lumbar puncture. This removes a small amount of the fluid that surrounds your brain and spinal cord. The fluid is then examined for infection. How is this treated? This condition must be treated in a hospital. Based on the cause of your infection, you may be given an antibiotic, antiviral, or antifungal medicine. You may also receive:  Fluids through an IV.  Oxygen and breathing assistance.  Medicines to increase your blood pressure.  Kidney dialysis. This process cleans your blood if your kidneys have failed.  Surgery to remove infected tissue.  Blood transfusion if needed.  Medicine to prevent blood clots.  Nutrients to correct imbalances in basic body function (metabolism). You may: ? Receive important salts and minerals (electrolytes) through an IV. ? Have your blood sugar level adjusted. Follow these instructions at home: Medicines   Take over-the-counter and   prescription medicines only as told by your health care provider.  If you were prescribed an antibiotic, antiviral, or antifungal medicine, take it as told by your health care provider. Do not stop taking the medicine even if you start to feel better. General instructions  If you have a  catheter or other indwelling device, ask to have it removed as soon as possible.  Keep all follow-up visits as told by your health care provider. This is important. Contact a health care provider if:  You do not feel like you are getting better or regaining strength.  You are having trouble coping with your recovery.  You frequently feel tired.  You feel worse or do not seem to get better after surgery.  You think you may have an infection after surgery. Get help right away if:  You have any symptoms of sepsis.  You have difficulty breathing.  You have a rapid or skipping heartbeat.  You become confused or disoriented.  You have a high fever.  Your skin becomes blotchy, pale, or blue.  You have an infection that is getting worse or not getting better. These symptoms may represent a serious problem that is an emergency. Do not wait to see if the symptoms will go away. Get medical help right away. Call your local emergency services (911 in the [place] Do not drive yourself to the hospital. Summary  Sepsis is a medical emergency that requires immediate treatment in a hospital.  This condition is caused by a severe reaction to infections from bacteria, viruses, or fungus.  Based on the cause of your infection, you may be given an antibiotic, antiviral, or antifungal medicine.  Treatment may also include IV fluids, breathing assistance, and kidney dialysis. This information is not intended to replace advice given to you by your health care provider. Make sure you discuss any questions you have with your health care provider. Document Revised: 02/07/2018 Document Reviewed: 02/07/2018 [employer] Patient Education  2020 [employer]  

## 2020-07-18 ENCOUNTER — Encounter: Payer: Self-pay | Admitting: Internal Medicine

## 2020-07-18 ENCOUNTER — Other Ambulatory Visit: Payer: Self-pay | Admitting: Internal Medicine

## 2020-07-18 ENCOUNTER — Ambulatory Visit: Payer: Medicare Other | Admitting: Internal Medicine

## 2020-07-18 DIAGNOSIS — E039 Hypothyroidism, unspecified: Secondary | ICD-10-CM

## 2020-08-11 ENCOUNTER — Other Ambulatory Visit: Payer: Self-pay

## 2020-08-15 ENCOUNTER — Encounter: Payer: Self-pay | Admitting: Internal Medicine

## 2020-08-15 ENCOUNTER — Other Ambulatory Visit: Payer: Self-pay

## 2020-08-15 ENCOUNTER — Ambulatory Visit: Payer: Medicare Other | Admitting: Internal Medicine

## 2020-08-15 VITALS — BP 120/70 | HR 80 | Ht 73.0 in | Wt 304.2 lb

## 2020-08-15 DIAGNOSIS — E1165 Type 2 diabetes mellitus with hyperglycemia: Secondary | ICD-10-CM

## 2020-08-15 MED ORDER — FREESTYLE LIBRE 2 SENSOR MISC: 1.0000 | Freq: Three times a day (TID) | 6 refills | Status: DC

## 2020-08-15 MED ORDER — GLIPIZIDE 5 MG PO TABS: 5.0000 mg | ORAL_TABLET | Freq: Every day | ORAL | 3 refills | Status: DC

## 2020-08-15 MED ORDER — FREESTYLE LIBRE 2 READER DEVI: 1.0000 | 0 refills | Status: DC

## 2020-08-15 NOTE — Progress Notes (Signed)
Name: Connor Becker.  Age/ Sex: 70 y.o., male   MRN/ DOB: [id number], Apr 23, 1951     PCP: Glendale Chard, MD   Reason for Endocrinology Evaluation: Type 2 Diabetes Mellitus  Initial Endocrine Consultative Visit: 04/13/2020    PATIENT IDENTIFIER: Connor Becker. is a 70 y.o. male with a past medical history of T2DM,OSA, hypogonadism and dyslipidemia. The patient has followed with Endocrinology clinic since 04/13/2020 for consultative assistance with management of his diabetes.  DIABETIC HISTORY:  Mr. Nouri was diagnosed with DM2 in 2014,Metformin- GI side effect, Farxiga - GI side effects but he was on metformin at the same time, but also noted dysuria but no actual diagnosis of UTI   His hemoglobin A1c has ranged from 6.6%  in 2020, peaking at 7.7% in 2021.  On his initial visit to our clinic his A1c was ,  He was not on any glycemic medications. Started Rybelsus  SUBJECTIVE:   During the last visit (04/13/2020): A1c 7.7 % started Rybelsus   Today (08/15/2020): Connor Becker is here for a follow up on diabetes.  He checks his blood sugars 1 times daily. The patient has not had hypoglycemic episodes since the last clinic visit.   He did well with Rybelsus until his admission  In 12/2021secondary to Noravirus followed by [facility]  He continues to have soft stools but no vomiting or diarrhea      HOME DIABETES REGIMEN:  Rybelsus 3 mg daily   Statin: yes ACE-I/ARB: yes    METER DOWNLOAD SUMMARY: Date range evaluated: 1/17-1/31/2022 Average Number Tests/Day = 0.9 Overall Mean FS Glucose = 165 Standard Deviation = 45  BG Ranges: Low = 116 High = 290   Hypoglycemic Events/30 Days: BG < 50 = 0 Episodes of symptomatic severe hypoglycemia = 0    DIABETIC COMPLICATIONS: Microvascular complications:    Denies: CKD, retinopathy, neuropathy  Last Eye Exam: Completed 03/2020  Macrovascular complications:    Denies: CAD, CVA, PVD   HISTORY:  Past  Medical History:  Past Medical History:  Diagnosis Date  . ALLERGIC RHINITIS   . Chronic pain of both knees   . DJD (degenerative joint disease) of knee    right  . DM2 (diabetes mellitus, type 2) ([place])   . GERD   . Glaucoma   . History of nuclear stress test    Myoview 12/17: EF 53, no ischemia, Low Risk  . HYPERLIPIDEMIA    myalgias from Lipitor  . HYPOGONADISM, MALE   . Hypothyroidism   . INSOMNIA-SLEEP DISORDER-UNSPEC   . Joint pain   . Obesity   . OSTEOARTHRITIS   . SLEEP APNEA, OBSTRUCTIVE    no CPAP; repeat sleep test pending 07/2016  . Unspecified hypothyroidism 12/04/2013  . Vitamin D deficiency    Past Surgical History:  Past Surgical History:  Procedure Laterality Date  . NASAL SEPTUM SURGERY    . TONSILLECTOMY    . UVULOPALATOPHARYNGOPLASTY    . VASECTOMY      Social History:  reports that he has never smoked. He has never used smokeless tobacco. He reports previous alcohol use. He reports that he does not use drugs. Family History:  Family History  Problem Relation Age of Onset  . Diabetes Maternal Uncle   . Heart attack Maternal Uncle   . Hypertension Other   . Prostate cancer Other   . Heart disease Other   . Heart attack Mother 74       s/p MI  .  Other Mother        pacemaker  . Diabetes Mother   . High blood pressure Mother   . High Cholesterol Mother   . Thyroid disease Mother   . Obesity Mother   . Macular degeneration Father   . Thyroid disease Father   . Colon cancer Neg Hx      HOME MEDICATIONS: Allergies as of 08/15/2020      Reactions   Horseradish [armoracia Rusticana Ext (horseradish)]    Lipitor [atorvastatin] Other (See Comments)   Leg cramps   Metformin And Related Other (See Comments)   GI upset   Soy Allergy       Medication List       Accurate as of August 15, 2020  3:34 PM. If you have any questions, ask your nurse or doctor.        dicyclomine 20 MG tablet Commonly known as: BENTYL Take 1 tablet (20 mg  total) by mouth 2 (two) times daily.   EPINEPHrine 0.3 mg/0.3 mL Soaj injection Commonly known as: EPI-PEN Inject 0.3 mg into the muscle as needed for anaphylaxis.   FreeStyle Libre 2 Reader Devi 1 Device by Does not apply route as directed. Started by: Dorita Sciara, MD   FreeStyle Libre 2 Sensor Misc 1 Device by Does not apply route in the morning, at noon, and at bedtime. Started by: Dorita Sciara, MD   glipiZIDE 5 MG tablet Commonly known as: GLUCOTROL Take 1 tablet (5 mg total) by mouth daily before breakfast. Started by: Dorita Sciara, MD   latanoprost 0.005 % ophthalmic solution Commonly known as: XALATAN Place 1 drop into both eyes daily.   levothyroxine 112 MCG tablet Commonly known as: SYNTHROID Take 1 tablet by mouth once daily   loperamide 2 MG capsule Commonly known as: IMODIUM Take 1 capsule (2 mg total) by mouth 4 (four) times daily as needed for diarrhea or loose stools.   Magnesium 400 MG Caps Take 400 mg by mouth at bedtime.   methocarbamol 750 MG tablet Commonly known as: ROBAXIN Take 750 mg by mouth 4 (four) times daily as needed for muscle spasms.   Multivitamin/Extra Vitamin D3 Chew Chew 1 tablet by mouth daily.   omeprazole 20 MG capsule Commonly known as: PRILOSEC Take 20 mg by mouth daily as needed (acid reflux).   OneTouch Verio test strip Generic drug: glucose blood USE STRIP TO CHECK BLOOD SUGAR UP TO 4 TIMES PER DAY   predniSONE 5 MG tablet Commonly known as: DELTASONE Take 20 mg on day 1, 10 mg on day 2 and 5 mg on day 3.   promethazine 25 MG tablet Commonly known as: PHENERGAN Take 0.5 tablets (12.5 mg total) by mouth every 8 (eight) hours as needed for nausea or vomiting.   pseudoephedrine 30 MG tablet Commonly known as: SUDAFED Take 60 mg by mouth 2 (two) times daily as needed for congestion.   Rybelsus 3 MG Tabs Generic drug: Semaglutide Take 3 mg by mouth daily with breakfast.   tadalafil 5 MG  tablet Commonly known as: CIALIS Take 5 mg by mouth daily.   testosterone enanthate 200 MG/ML injection Commonly known as: DELATESTRYL Inject 200 mg into the muscle every 14 (fourteen) days.   timolol 0.5 % ophthalmic solution Commonly known as: TIMOPTIC Place 1 drop into both eyes daily.   Turmeric 500 MG Caps Take 500 mg by mouth daily.   vitamin C 500 MG tablet Commonly known as: ASCORBIC ACID Take  1,000 mg by mouth daily.   Vitamin D (Ergocalciferol) 1.25 MG (50000 UNIT) Caps capsule Commonly known as: DRISDOL Take one capsule by mouth on Tues/Fridays What changed:   how much to take  how to take this  when to take this        OBJECTIVE:   Vital Signs: BP 120/70   Pulse 80   Ht 6\' 1"  (1.854 m)   Wt (!) 304 lb 4 oz (138 kg)   SpO2 98%   BMI 40.14 kg/m   Wt Readings from Last 3 Encounters:  08/15/20 (!) 304 lb 4 oz (138 kg)  07/14/20 297 lb 6.4 oz (134.9 kg)  07/01/20 (!) 302 lb (137 kg)     Exam: General: Pt appears well and is in NAD  Lungs: Clear with good BS bilat with no rales, rhonchi, or wheezes  Heart: RRR with normal S1 and S2 and no gallops; no murmurs; no rub  Extremities: Trace pretibial edema.   Neuro: MS is good with appropriate affect, pt is alert and Ox3   DM foot exam: 04/13/2020  The skin of the feet is intact without sores or ulcerations. The pedal pulses are 2+ on right and 2+ on left. The sensation is intact to a screening 5.07, 10 gram monofilament bilaterally     DATA REVIEWED:  Lab Results  Component Value Date   HGBA1C 7.6 (H) 06/20/2020   HGBA1C 7.7 (H) 03/31/2020   HGBA1C 7.3 (H) 01/06/2020   Lab Results  Component Value Date   MICROALBUR 80 06/20/2020   LDLCALC 113 (H) 01/06/2020   CREATININE 0.98 07/09/2020   Lab Results  Component Value Date   MICRALBCREAT <30 06/20/2020     Lab Results  Component Value Date   CHOL 196 01/06/2020   HDL 35 (L) 01/06/2020   LDLCALC 113 (H) 01/06/2020   LDLDIRECT  148.0 11/17/2015   TRIG 276 (H) 01/06/2020   CHOLHDL 5.1 (H) 03/11/2019         ASSESSMENT / PLAN / RECOMMENDATIONS:   1) Type 2 Diabetes Mellitus, Sub-Optimally controlled, Without complications - Most recent A1c of 7.6 %. Goal A1c < 7.0 %.     -He is intolerant to metformin, he was on Farxiga but this caused frequency preventing him from sleeping at night, he is not too keen on restarting this  He has been having GI issues secondary to viral infection, I am concerned that Rybelsus is a contributing factor, will not increase the dose at this time until GI issues resolve, in the meantime will start Glipizide the patient advised to take them prior to first meal of the day ( within 30 minutes). Cautioned against weight gain with glipizide, emphasized the importance of low carb diet.    Freestyle libre prescription sent to the pharmacy per pt's request , he understands this may not be covered given his insurance and no insulin intake     MEDICATIONS:   -Continue Rybelsus 3 mg , 1 tablet with Breakfast  - Start Glipizide 5 mg , 1 tablet before Breakfast     EDUCATION / INSTRUCTIONS:  BG monitoring instructions: Patient is instructed to check his blood sugars 2 times a day, fasting and bedtime .  Call [facility] if: BG persistently < 70  . I reviewed the Rule of 15 for the treatment of hypoglycemia in detail with the patient. Literature supplied.    2) Diabetic complications:   Eye: Does not have known diabetic retinopathy.   Neuro/  Feet: Does not have known diabetic peripheral neuropathy .   Renal: Patient does not have known baseline CKD.     F/U in 2 months     Signed electronically by: Mack Guise, MD  [facility] End[facility]  [facility] [address] [place], [place] [address] Phone: [phone] FAX: [phone]   CC: Glendale Chard, [address] [place] [address] Phone:  [phone]  Fax: [phone]  Return to Endocrinology clinic as below: Future Appointments  Date Time Provider [place]  10/19/2020  9:30 AM [facility] None  10/19/2020 10:15 AM Glendale Chard, MD [facility] None  10/19/2020  2:00 PM Saori Umholtz, Melanie Crazier, MD [facility] None  12/19/2020 10:30 AM Glendale Chard, MD [facility] None  06/28/2021 10:00 AM Glendale Chard, MD [facility] None

## 2020-08-15 NOTE — Patient Instructions (Signed)
-   Continue Rybelsus 3 mg , 1 tablet with Breakfast  - Start Glipizide 5 mg , 1 tablet before Breakfast       HOW TO TREAT LOW BLOOD SUGARS (Blood sugar LESS THAN 70 MG/DL)  Please follow the RULE OF 15 for the treatment of hypoglycemia treatment (when your (blood sugars are less than 70 mg/dL)    STEP 1: Take 15 grams of carbohydrates when your blood sugar is low, which includes:   3-4 GLUCOSE TABS  OR  3-4 OZ OF JUICE OR REGULAR SODA OR  ONE TUBE OF GLUCOSE GEL     STEP 2: RECHECK blood sugar in 15 MINUTES STEP 3: If your blood sugar is still low at the 15 minute recheck --> then, go back to STEP 1 and treat AGAIN with another 15 grams of carbohydrates.

## 2020-09-23 ENCOUNTER — Emergency Department (HOSPITAL_COMMUNITY): Payer: Medicare Other

## 2020-09-23 ENCOUNTER — Emergency Department (HOSPITAL_COMMUNITY)
Admission: EM | Admit: 2020-09-23 | Discharge: 2020-09-23 | Disposition: A | Discharge status: Home / Self Care | Payer: Medicare Other | Attending: Emergency Medicine | Admitting: Emergency Medicine

## 2020-09-23 ENCOUNTER — Encounter (HOSPITAL_COMMUNITY): Payer: Self-pay | Admitting: Emergency Medicine

## 2020-09-23 ENCOUNTER — Other Ambulatory Visit: Payer: Self-pay

## 2020-09-23 DIAGNOSIS — E782 Mixed hyperlipidemia: Secondary | ICD-10-CM | POA: Diagnosis not present

## 2020-09-23 DIAGNOSIS — M25511 Pain in right shoulder: Secondary | ICD-10-CM | POA: Diagnosis not present

## 2020-09-23 DIAGNOSIS — S6991XA Unspecified injury of right wrist, hand and finger(s), initial encounter: Secondary | ICD-10-CM | POA: Diagnosis present

## 2020-09-23 DIAGNOSIS — Z7984 Long term (current) use of oral hypoglycemic drugs: Secondary | ICD-10-CM | POA: Diagnosis not present

## 2020-09-23 DIAGNOSIS — S43034A Inferior dislocation of right humerus, initial encounter: Secondary | ICD-10-CM | POA: Diagnosis not present

## 2020-09-23 DIAGNOSIS — E039 Hypothyroidism, unspecified: Secondary | ICD-10-CM | POA: Diagnosis not present

## 2020-09-23 DIAGNOSIS — Z79899 Other long term (current) drug therapy: Secondary | ICD-10-CM | POA: Diagnosis not present

## 2020-09-23 DIAGNOSIS — E1169 Type 2 diabetes mellitus with other specified complication: Secondary | ICD-10-CM | POA: Insufficient documentation

## 2020-09-23 DIAGNOSIS — Z743 Need for continuous supervision: Secondary | ICD-10-CM | POA: Diagnosis not present

## 2020-09-23 DIAGNOSIS — S43004A Unspecified dislocation of right shoulder joint, initial encounter: Secondary | ICD-10-CM | POA: Diagnosis not present

## 2020-09-23 DIAGNOSIS — W19XXXA Unspecified fall, initial encounter: Secondary | ICD-10-CM | POA: Diagnosis not present

## 2020-09-23 DIAGNOSIS — E1139 Type 2 diabetes mellitus with other diabetic ophthalmic complication: Secondary | ICD-10-CM | POA: Diagnosis not present

## 2020-09-23 DIAGNOSIS — W11XXXA Fall on and from ladder, initial encounter: Secondary | ICD-10-CM | POA: Diagnosis not present

## 2020-09-23 DIAGNOSIS — S43014A Anterior dislocation of right humerus, initial encounter: Secondary | ICD-10-CM | POA: Diagnosis not present

## 2020-09-23 DIAGNOSIS — I499 Cardiac arrhythmia, unspecified: Secondary | ICD-10-CM | POA: Diagnosis not present

## 2020-09-23 MED ORDER — ONDANSETRON HCL 4 MG/2ML IJ SOLN
4.0000 mg | Freq: Once | INTRAMUSCULAR | Status: AC
  Administered 2020-09-23: 4 mg via INTRAVENOUS
  Filled 2020-09-23: qty 2

## 2020-09-23 MED ORDER — HYDROCODONE-ACETAMINOPHEN 5-325 MG PO TABS: 2.0000 | ORAL_TABLET | ORAL | 0 refills | Status: DC | PRN

## 2020-09-23 MED ORDER — PROMETHAZINE HCL 25 MG PO TABS
25.0000 mg | ORAL_TABLET | Freq: Once | ORAL | Status: AC
  Administered 2020-09-23: 25 mg via ORAL
  Filled 2020-09-23: qty 1

## 2020-09-23 MED ORDER — HYDROCODONE-ACETAMINOPHEN 5-325 MG PO TABS
1.0000 | ORAL_TABLET | Freq: Once | ORAL | Status: AC
  Administered 2020-09-23: 1 via ORAL
  Filled 2020-09-23: qty 1

## 2020-09-23 MED ORDER — FENTANYL CITRATE (PF) 100 MCG/2ML IJ SOLN
100.0000 ug | Freq: Once | INTRAMUSCULAR | Status: AC
  Administered 2020-09-23: 100 ug via INTRAVENOUS
  Filled 2020-09-23: qty 2

## 2020-09-23 MED ORDER — PROMETHAZINE HCL 12.5 MG PO TABS: 12.5000 mg | ORAL_TABLET | Freq: Four times a day (QID) | ORAL | 0 refills | Status: DC | PRN

## 2020-09-23 MED ORDER — MIDAZOLAM HCL 2 MG/2ML IJ SOLN
INTRAMUSCULAR | Status: AC
  Filled 2020-09-23: qty 2

## 2020-09-23 MED ORDER — HYDROMORPHONE HCL 1 MG/ML IJ SOLN
0.5000 mg | Freq: Once | INTRAMUSCULAR | Status: AC
  Administered 2020-09-23: 0.5 mg via INTRAVENOUS
  Filled 2020-09-23: qty 1

## 2020-09-23 MED ORDER — ETOMIDATE 2 MG/ML IV SOLN
0.3000 mg/kg | Freq: Once | INTRAVENOUS | Status: AC
  Administered 2020-09-23: 8 mg via INTRAVENOUS
  Filled 2020-09-23: qty 30

## 2020-09-23 NOTE — Discharge Instructions (Addendum)
You can take Tylenol or Ibuprofen as directed for pain. You can alternate Tylenol and Ibuprofen every 4 hours. If you take Tylenol at 1pm, then you can take Ibuprofen at 5pm. Then you can take Tylenol again at 9pm.   Take pain medications as directed for break through pain. Do not drive or operate machinery while taking this medication.   Take phenergan as needed for nausea.  Keep the shoulder immobilizer on.  Follow up with orthopedics. Call their office and arrange an appointment.   Return to Emergency Dept for any worsening pain, numbness/weakness, or any other worsening or concerning symptoms.

## 2020-09-23 NOTE — Sedation Documentation (Signed)
Reduction of right displaced shoulder complete.

## 2020-09-23 NOTE — ED Triage Notes (Addendum)
Patient slipped off a ladder and got his R arm caught in a wrung, arm is wrapped by Fire, Fire did not report any deformities. States pain in his R shoulder and elbow. Pulses and movement intact. EMS gave 250 mcg of intranasal fentanyl.

## 2020-09-23 NOTE — ED Provider Notes (Signed)
[facility] DEPT Provider Note   CSN: [id number] Arrival date & time: 09/23/20  1612     History Chief Complaint  Patient presents with  . Arm Injury  . Fall    Judy Goodenow. is a 70 y.o. male for the EMS for evaluation of right shoulder, right arm pain after a fall that occurred about an hour and a half prior to arrival.  He reports he was on a ladder that he estimates is about 2 feet off the ground and states he slipped and fell, landing on his right upper extremity.  He states he did not hit his head and denies any LOC.  He is not on any blood thinners.  He states he has been able to ambulate since this occurred.  On initial EMS arrival, he had his arm placed in a sling.  He reports pain noted to the shoulder down to the elbow.  He denies any chest pain or dizziness that preceded his fall.  Denies any chest pain, neck pain, back pain, abdominal pain, numbness/weakness of his left upper extremity, bilateral lower extremities.  He does have a little bit of tingling sensation noted to his right hand.  The history is provided by the patient.       Past Medical History:  Diagnosis Date  . ALLERGIC RHINITIS   . Chronic pain of both knees   . DJD (degenerative joint disease) of knee    right  . DM2 (diabetes mellitus, type 2) ([place])   . GERD   . Glaucoma   . History of nuclear stress test    Myoview 12/17: EF 53, no ischemia, Low Risk  . HYPERLIPIDEMIA    myalgias from Lipitor  . HYPOGONADISM, MALE   . Hypothyroidism   . INSOMNIA-SLEEP DISORDER-UNSPEC   . Joint pain   . Obesity   . OSTEOARTHRITIS   . SLEEP APNEA, OBSTRUCTIVE    no CPAP; repeat sleep test pending 07/2016  . Unspecified hypothyroidism 12/04/2013  . Vitamin D deficiency     Patient Active Problem List   Diagnosis Date Noted  . Diarrhea of presumed infectious origin   . Sepsis ([place]) 07/01/2020  . Dyslipidemia 04/13/2020  . Type 2 diabetes mellitus with hyperglycemia,  without long-term current use of insulin ([place]) 04/13/2020  . Insulin resistance 07/01/2018  . Orthostatic hypotension 05/19/2018  . Type 2 diabetes mellitus with hyperglycemia ([place]) 05/19/2018  . Vitamin D deficiency disease 05/19/2018  . Obstructive sleep apnea 05/19/2018  . Glaucoma 05/19/2018  . Class 3 severe obesity with serious comorbidity and body mass index (BMI) of 40.0 to 44.9 in adult ([place]) 05/19/2018  . Fatigue 02/01/2016  . Dizziness 02/01/2016  . PSA elevation 11/17/2015  . Osteoarthrosis involving lower leg 11/17/2015  . Ulnar neuritis 04/14/2015  . Acute sinus infection 04/27/2014  . Primary hypothyroidism 12/04/2013  . Morbid obesity ([place]) 12/04/2013  . Right shoulder pain 10/31/2010  . Preventative health care 10/28/2010  . Diabetes ([place]) 10/26/2010  . INSOMNIA-SLEEP DISORDER-UNSPEC 07/11/2007  . GERD 07/11/2007  . Hypogonadism in male 04/06/2007  . Mixed hyperlipidemia 04/06/2007  . ERECTILE DYSFUNCTION 04/06/2007  . SLEEP APNEA, OBSTRUCTIVE 04/06/2007  . Allergic rhinitis 04/06/2007  . OSTEOARTHRITIS 04/06/2007    Past Surgical History:  Procedure Laterality Date  . NASAL SEPTUM SURGERY    . TONSILLECTOMY    . UVULOPALATOPHARYNGOPLASTY    . VASECTOMY         Family History  Problem Relation Age of Onset  .  Diabetes Maternal Uncle   . Heart attack Maternal Uncle   . Hypertension Other   . Prostate cancer Other   . Heart disease Other   . Heart attack Mother 52       s/p MI  . Other Mother        pacemaker  . Diabetes Mother   . High blood pressure Mother   . High Cholesterol Mother   . Thyroid disease Mother   . Obesity Mother   . Macular degeneration Father   . Thyroid disease Father   . Colon cancer Neg Hx     Social History   Tobacco Use  . Smoking status: Never Smoker  . Smokeless tobacco: Never Used  Vaping Use  . Vaping Use: Never used  Substance Use Topics  . Alcohol use: Not Currently    Comment: occassional beer  . Drug  use: No    Home Medications Prior to Admission medications   Medication Sig Start Date End Date Taking? Authorizing Provider  HYDROcodone-acetaminophen (NORCO/VICODIN) 5-325 MG tablet Take 2 tablets by mouth every 4 (four) hours as needed. 09/23/20  Yes Volanda Napoleon, PA-C  promethazine (PHENERGAN) 12.5 MG tablet Take 1 tablet (12.5 mg total) by mouth every 6 (six) hours as needed for nausea or vomiting. 09/23/20  Yes Providence Lanius A, PA-C  Continuous Blood Gluc Receiver (FREESTYLE LIBRE 2 READER) DEVI 1 Device by Does not apply route as directed. 08/15/20   Shamleffer, Melanie Crazier, MD  Continuous Blood Gluc Sensor (FREESTYLE LIBRE 2 SENSOR) MISC 1 Device by Does not apply route in the morning, at noon, and at bedtime. 08/15/20   Shamleffer, Melanie Crazier, MD  dicyclomine (BENTYL) 20 MG tablet Take 1 tablet (20 mg total) by mouth 2 (two) times daily. 07/10/20   Caccavale, Sophia, PA-C  EPINEPHrine 0.3 mg/0.3 mL IJ SOAJ injection Inject 0.3 mg into the muscle as needed for anaphylaxis. 07/14/20   Ghumman, Milford Cage, NP  glipiZIDE (GLUCOTROL) 5 MG tablet Take 1 tablet (5 mg total) by mouth daily before breakfast. 08/15/20   Shamleffer, Melanie Crazier, MD  latanoprost (XALATAN) 0.005 % ophthalmic solution Place 1 drop into both eyes daily.    [provider]  levothyroxine (SYNTHROID) 112 MCG tablet Take 1 tablet by mouth once daily 07/18/20   Glendale Chard, MD  loperamide (IMODIUM) 2 MG capsule Take 1 capsule (2 mg total) by mouth 4 (four) times daily as needed for diarrhea or loose stools. 07/10/20   Caccavale, Sophia, PA-C  Magnesium 400 MG CAPS Take 400 mg by mouth at bedtime. 03/11/19   Glendale Chard, MD  methocarbamol (ROBAXIN) 750 MG tablet Take 750 mg by mouth 4 (four) times daily as needed for muscle spasms.    [provider]  Multiple Vitamins-Minerals (MULTIVITAMIN/EXTRA VITAMIN D3) CHEW Chew 1 tablet by mouth daily.    [provider]  omeprazole  (PRILOSEC) 20 MG capsule Take 20 mg by mouth daily as needed (acid reflux).    [provider]  ONETOUCH VERIO test strip USE STRIP TO CHECK BLOOD SUGAR UP TO 4 TIMES PER DAY 06/04/18   Glendale Chard, MD  predniSONE (DELTASONE) 5 MG tablet Take 20 mg on day 1, 10 mg on day 2 and 5 mg on day 3. 07/14/20   Ghumman, Ramandeep, NP  pseudoephedrine (SUDAFED) 30 MG tablet Take 60 mg by mouth 2 (two) times daily as needed for congestion.    [provider]  Semaglutide (RYBELSUS) 3 MG TABS Take 3  mg by mouth daily with breakfast. 04/13/20   Shamleffer, Melanie Crazier, MD  tadalafil (CIALIS) 5 MG tablet Take 5 mg by mouth daily. 04/21/18   [provider]  testosterone enanthate (DELATESTRYL) 200 MG/ML injection Inject 200 mg into the muscle every 14 (fourteen) days.    [provider]  timolol (TIMOPTIC) 0.5 % ophthalmic solution Place 1 drop into both eyes daily. 06/11/18   [provider]  Turmeric 500 MG CAPS Take 500 mg by mouth daily.    [provider]  vitamin C (ASCORBIC ACID) 500 MG tablet Take 1,000 mg by mouth daily.    [provider]  Vitamin D, Ergocalciferol, (DRISDOL) 1.25 MG (50000 UNIT) CAPS capsule Take one capsule by mouth on Tues/Fridays Patient taking differently: Take 50,000 Units by mouth See admin instructions. Take one capsule by mouth on Tues/Fridays 10/19/19   Glendale Chard, MD    Allergies    Horseradish Mariea Stable rusticana ext (horseradish)], Lipitor [atorvastatin], Metformin and related, and Soy allergy  Review of Systems   Review of Systems  Cardiovascular: Negative for chest pain.  Gastrointestinal: Negative for abdominal pain, nausea and vomiting.  Musculoskeletal: Negative for back pain and neck pain.       RUE pain  Neurological: Positive for numbness. Negative for weakness.  All other systems reviewed and are negative.   Physical Exam Updated Vital Signs BP (!) 144/84   Pulse 66   Temp (!) 97.1  F (36.2 C) (Oral)   Resp 13   Ht 6\' 1"  (1.854 m)   Wt (!) 139 kg   SpO2 98%   BMI 40.43 kg/m   Physical Exam Vitals and nursing note reviewed.  Constitutional:      Appearance: Normal appearance. He is well-developed.  HENT:     Head: Normocephalic and atraumatic.     Comments: No tenderness to palpation of skull. No deformities or crepitus noted. No open wounds, abrasions or lacerations.  Eyes:     General: Lids are normal.     Conjunctiva/sclera: Conjunctivae normal.     Pupils: Pupils are equal, round, and reactive to light.     Comments: PERRL. EOMs intact. No nystagmus. No neglect.   Neck:     Comments: Full flexion/extension and lateral movement of neck fully intact. No bony midline tenderness. No deformities or crepitus.  Cardiovascular:     Rate and Rhythm: Normal rate and regular rhythm.     Pulses: Normal pulses.          Radial pulses are 2+ on the right side and 2+ on the left side.       Popliteal pulses are 2+ on the right side and 2+ on the left side.     Heart sounds: Normal heart sounds. No murmur heard. No friction rub. No gallop.   Pulmonary:     Effort: Pulmonary effort is normal.     Breath sounds: Normal breath sounds.     Comments: Lungs clear to auscultation bilaterally.  Symmetric chest rise.  No wheezing, rales, rhonchi. Abdominal:     Palpations: Abdomen is soft. Abdomen is not rigid.     Tenderness: There is no abdominal tenderness. There is no guarding.     Comments: Abdomen is soft, non-distended, non-tender. No rigidity, No guarding. No peritoneal signs.  Musculoskeletal:        General: Normal range of motion.     Cervical back: Full passive range of motion without pain.     Comments: Tenderness palpation  noted to the right shoulder, right humerus, right elbow.  Limited range of motion secondary to pain.  No obvious deformity or crepitus noted.  No bony tenderness intubate forearm, right wrist.  He can wiggle all 5 digits with any difficulty.   No bony tenderness noted to the right upper extremity.  No pelvic instability. No tenderness to palpation to bilateral knees and ankles. No deformities or crepitus noted. FROM of BLE without any difficulty. No midline T or L spine tenderness. No deformity or crepitus noted.   Skin:    General: Skin is warm and dry.     Capillary Refill: Capillary refill takes less than 2 seconds.  Neurological:     Mental Status: He is alert and oriented to person, place, and time.     Comments: Cranial nerves III-XII intact Follows commands, Moves all extremities  5/5 strength to LUE and BLE. Limited exam of RUE secondary to pain.  He does report some tingling sensation noted to his hand on the right upper extremity.  Otherwise sensation intact throughout all major nerve distributions No slurred speech. No facial droop.   Psychiatric:        Speech: Speech normal.     ED Results / Procedures / Treatments   Labs (all labs ordered are listed, but only abnormal results are displayed) Labs Reviewed - No data to display  EKG None  Radiology DG Shoulder Right  Result Date: 09/23/2020 CLINICAL DATA:  Injured right shoulder. EXAM: RIGHT SHOULDER - 2+ VIEW COMPARISON:  None. FINDINGS: Anterior inferior humeral head dislocation.  No definite fracture. IMPRESSION: Anterior inferior humeral head dislocation. Electronically Signed   By: Marijo Sanes M.D.   On: 09/23/2020 17:31   DG Shoulder Right Portable  Result Date: 09/23/2020 CLINICAL DATA:  Shoulder dislocation, postreduction. EXAM: PORTABLE RIGHT SHOULDER COMPARISON:  Pre reduction radiographs earlier today. FINDINGS: Single portable view of the right shoulder. Interval reduction of prior shoulder dislocation. Alignment appears anatomic. Limited assessment for fracture on provided view. Mild acromioclavicular degenerative change. IMPRESSION: Post reduction of right shoulder dislocation. Limited assessment for fracture on provided view. Electronically Signed    By: Keith Rake M.D.   On: 09/23/2020 19:03    Procedures Reduction of dislocation  Date/Time: 09/23/2020 7:45 PM Performed by: Volanda Napoleon, PA-C Authorized by: Volanda Napoleon, PA-C  Consent: Verbal consent obtained. Risks and benefits: risks, benefits and alternatives were discussed Consent given by: patient Patient understanding: patient states understanding of the procedure being performed Patient consent: the patient's understanding of the procedure matches consent given Procedure consent: procedure consent matches procedure scheduled Relevant documents: relevant documents present and verified Test results: test results available and properly labeled Site marked: the operative site was marked Imaging studies: imaging studies available Patient identity confirmed: verbally with patient Time out: Immediately prior to procedure a "time out" was called to verify the correct patient, procedure, equipment, support staff and site/side marked as required. Patient tolerance: patient tolerated the procedure well with no immediate complications      Medications Ordered in ED Medications  ondansetron (ZOFRAN) injection 4 mg (4 mg Intravenous Given 09/23/20 1640)  fentaNYL (SUBLIMAZE) injection 100 mcg (100 mcg Intravenous Given 09/23/20 1641)  HYDROmorphone (DILAUDID) injection 0.5 mg (0.5 mg Intravenous Given 09/23/20 1748)  etomidate (AMIDATE) injection 41.7 mg (8 mg Intravenous Given 09/23/20 1820)  HYDROcodone-acetaminophen (NORCO/VICODIN) 5-325 MG per tablet 1 tablet (1 tablet Oral Given 09/23/20 1929)  promethazine (PHENERGAN) tablet 25 mg (25 mg Oral Given 09/23/20 1929)  ED Course  I have reviewed the triage vital signs and the nursing notes.  Pertinent labs & imaging results that were available during my care of the patient were reviewed by me and considered in my medical decision making (see chart for details).    MDM Rules/Calculators/A&P                           70 year old male brought in by EMS for evaluation after mechanical fall of a 2 foot ladder.  He states he did not hit his head.  No blood thinner use.  Reports he landed on his right upper extremity and that is where he is having pain today.  He has been able to ambulate since this happened.  No neck pain, back pain.  On initial arrival, he is afebrile, appears uncomfortable but no acute distress.  Vital signs are stable.  He got some intranasal fentanyl in route that did not help his pain.  Will give additional IV fentanyl and check x-rays.  X-ray shows anterior inferior humeral head dislocation.  Discussed results with patient.  At this time, patient has a lot of pain with movement of the shoulder.  Discussed treatment options and he agreed with sedation.  Reduction is done as documented above.  Patient tolerated procedure well.  Shoulder x-ray shows reduction of shoulder dislocation.  There is limited assessment for fracture in provided view.  I did discuss with patient regarding results and said that there could be a small fracture.  We will plan to treat immobilization with a shoulder immobilizer. Plan to have him follow up with ortho.  Reevaluation after reduction shows good radial pulses, cap refill.  He still has some mild tingling sensation.  Will give short course of pain medication for acute breakthrough pain.  Patient is agreeable. Patient had ample opportunity for questions and discussion. All patient's questions were answered with full understanding. Strict return precautions discussed. Patient expresses understanding and agreement to plan.   Portions of this note were generated with [employer]. Dictation errors may occur despite best attempts at proofreading.  Final Clinical Impression(s) / ED Diagnoses Final diagnoses:  Dislocation of right shoulder joint, initial encounter    Rx / DC Orders ED Discharge Orders         Ordered    HYDROcodone-acetaminophen  (NORCO/VICODIN) 5-325 MG tablet  Every 4 hours PRN        09/23/20 1931    promethazine (PHENERGAN) 12.5 MG tablet  Every 6 hours PRN        09/23/20 1931           Desma Mcgregor 09/23/20 2215    Varney Biles, MD 09/25/20 1415

## 2020-09-23 NOTE — ED Notes (Signed)
Patient given PO food and fluids. 

## 2020-09-25 NOTE — ED Provider Notes (Signed)
  Physical Exam  BP (!) 144/84   Pulse 66   Temp (!) 97.1 F (36.2 C) (Oral)   Resp 13   Ht 6\' 1"  (1.854 m)   Wt (!) 139 kg   SpO2 98%   BMI 40.43 kg/m   Physical Exam  ED Course/Procedures     70 year old male comes in a chief complaint of fall.  He has right shoulder pain.  Initial thought was that he might have fracture, however x-ray confirms dislocation.  Shoulder was reduced without any complication.  .Sedation  Date/Time: 09/25/2020 2:15 PM Performed by: Varney Biles, MD Authorized by: Varney Biles, MD   Consent:    Consent obtained:  Written   Consent given by:  Patient   Risks discussed:  Allergic reaction, dysrhythmia, inadequate sedation, nausea, prolonged hypoxia resulting in organ damage, prolonged sedation necessitating reversal, respiratory compromise necessitating ventilatory assistance and intubation and vomiting   Alternatives discussed:  Analgesia without sedation, anxiolysis and regional anesthesia Universal protocol:    Procedure explained and questions answered to patient or proxy's satisfaction: yes     Relevant documents present and verified: yes     Test results available: yes     Imaging studies available: yes     Required blood products, implants, devices, and special equipment available: yes     Site/side marked: yes     Immediately prior to procedure, a time out was called: yes     Patient identity confirmed:  Verbally with patient and arm band Indications:    Procedure performed:  Dislocation reduction   Procedure necessitating sedation performed by:  Physician performing sedation Pre-sedation assessment:    Time since last food or drink:  6 hours   ASA classification: class 3 - patient with severe systemic disease     Mouth opening:  3 or more finger widths   Thyromental distance:  4 finger widths   Mallampati score:  II - soft palate, uvula, fauces visible   Neck mobility: normal     Pre-sedation assessments completed and reviewed:  airway patency, cardiovascular function, hydration status, mental status, nausea/vomiting, pain level, respiratory function and temperature     Pre-sedation assessment completed:  09/23/2020 6:30 PM Immediate pre-procedure details:    Reassessment: Patient reassessed immediately prior to procedure     Reviewed: vital signs, relevant labs/tests and NPO status     Verified: bag valve mask available, emergency equipment available, intubation equipment available, IV patency confirmed, oxygen available and suction available   Procedure details (see MAR for exact dosages):    Preoxygenation:  Nasal cannula   Sedation:  Etomidate   Intended level of sedation: deep   Analgesia:  Hydromorphone   Intra-procedure monitoring:  Blood pressure monitoring, cardiac monitor, continuous pulse oximetry, frequent LOC assessments, frequent vital sign checks and continuous capnometry   Intra-procedure events: none     Total Provider sedation time (minutes):  25 Post-procedure details:    Post-sedation assessment completed:  09/23/2020 7:07 PM   Attendance: Constant attendance by certified staff until patient recovered     Recovery: Patient returned to pre-procedure baseline     Post-sedation assessments completed and reviewed: airway patency, cardiovascular function, hydration status, mental status, nausea/vomiting, pain level, respiratory function and temperature     Patient is stable for discharge or admission: yes     Procedure completion:  Tolerated well, no immediate complications    MDM         Varney Biles, MD 09/25/20 1418

## 2020-09-26 DIAGNOSIS — M25521 Pain in right elbow: Secondary | ICD-10-CM | POA: Diagnosis not present

## 2020-09-26 DIAGNOSIS — M25311 Other instability, right shoulder: Secondary | ICD-10-CM | POA: Diagnosis not present

## 2020-09-30 ENCOUNTER — Other Ambulatory Visit: Payer: Self-pay | Admitting: Family Medicine

## 2020-09-30 DIAGNOSIS — M25511 Pain in right shoulder: Secondary | ICD-10-CM

## 2020-10-12 DIAGNOSIS — H2513 Age-related nuclear cataract, bilateral: Secondary | ICD-10-CM | POA: Diagnosis not present

## 2020-10-12 DIAGNOSIS — H401133 Primary open-angle glaucoma, bilateral, severe stage: Secondary | ICD-10-CM | POA: Diagnosis not present

## 2020-10-12 DIAGNOSIS — H47233 Glaucomatous optic atrophy, bilateral: Secondary | ICD-10-CM | POA: Diagnosis not present

## 2020-10-12 LAB — HM DIABETES EYE EXAM

## 2020-10-16 ENCOUNTER — Other Ambulatory Visit: Payer: Self-pay

## 2020-10-16 ENCOUNTER — Ambulatory Visit
Admission: RE | Admit: 2020-10-16 | Discharge: 2020-10-16 | Disposition: A | Discharge status: Home / Self Care | Payer: Medicare Other | Source: Ambulatory Visit | Attending: Family Medicine | Admitting: Family Medicine

## 2020-10-16 DIAGNOSIS — S42141A Displaced fracture of glenoid cavity of scapula, right shoulder, initial encounter for closed fracture: Secondary | ICD-10-CM | POA: Diagnosis not present

## 2020-10-16 DIAGNOSIS — M75101 Unspecified rotator cuff tear or rupture of right shoulder, not specified as traumatic: Secondary | ICD-10-CM | POA: Diagnosis not present

## 2020-10-16 DIAGNOSIS — S43431A Superior glenoid labrum lesion of right shoulder, initial encounter: Secondary | ICD-10-CM | POA: Diagnosis not present

## 2020-10-16 DIAGNOSIS — M25411 Effusion, right shoulder: Secondary | ICD-10-CM | POA: Diagnosis not present

## 2020-10-16 DIAGNOSIS — M25511 Pain in right shoulder: Secondary | ICD-10-CM

## 2020-10-19 ENCOUNTER — Ambulatory Visit: Payer: Medicare Other | Admitting: Internal Medicine

## 2020-10-19 ENCOUNTER — Encounter: Payer: Self-pay | Admitting: Internal Medicine

## 2020-10-19 ENCOUNTER — Other Ambulatory Visit: Payer: Self-pay

## 2020-10-19 ENCOUNTER — Ambulatory Visit (INDEPENDENT_AMBULATORY_CARE_PROVIDER_SITE_OTHER): Payer: Medicare Other

## 2020-10-19 ENCOUNTER — Ambulatory Visit (INDEPENDENT_AMBULATORY_CARE_PROVIDER_SITE_OTHER): Payer: Medicare Other | Admitting: Internal Medicine

## 2020-10-19 VITALS — BP 126/82 | HR 88 | Ht 73.0 in | Wt 300.0 lb

## 2020-10-19 VITALS — BP 122/80 | HR 84 | Temp 97.9°F | Ht 73.0 in | Wt 299.0 lb

## 2020-10-19 DIAGNOSIS — Z0000 Encounter for general adult medical examination without abnormal findings: Secondary | ICD-10-CM

## 2020-10-19 DIAGNOSIS — Z6839 Body mass index (BMI) 39.0-39.9, adult: Secondary | ICD-10-CM

## 2020-10-19 DIAGNOSIS — E1165 Type 2 diabetes mellitus with hyperglycemia: Secondary | ICD-10-CM

## 2020-10-19 DIAGNOSIS — I739 Peripheral vascular disease, unspecified: Secondary | ICD-10-CM

## 2020-10-19 DIAGNOSIS — E039 Hypothyroidism, unspecified: Secondary | ICD-10-CM | POA: Diagnosis not present

## 2020-10-19 DIAGNOSIS — Z86010 Personal history of colonic polyps: Secondary | ICD-10-CM | POA: Diagnosis not present

## 2020-10-19 DIAGNOSIS — R7989 Other specified abnormal findings of blood chemistry: Secondary | ICD-10-CM | POA: Diagnosis not present

## 2020-10-19 DIAGNOSIS — E291 Testicular hypofunction: Secondary | ICD-10-CM

## 2020-10-19 MED ORDER — VITAMIN D (ERGOCALCIFEROL) 1.25 MG (50000 UNIT) PO CAPS: 50000.0000 [IU] | ORAL_CAPSULE | ORAL | 0 refills | Status: DC

## 2020-10-19 MED ORDER — RYBELSUS 7 MG PO TABS: 7.0000 mg | ORAL_TABLET | Freq: Every day | ORAL | 6 refills | Status: DC

## 2020-10-19 NOTE — Patient Instructions (Signed)
Diabetes Mellitus and Foot Care Foot care is an important part of your health, especially when you have diabetes. Diabetes may cause you to have problems because of poor blood flow (circulation) to your feet and legs, which can cause your skin to:  Become thinner and drier.  Break more easily.  Heal more slowly.  Peel and crack. You may also have nerve damage (neuropathy) in your legs and feet, causing decreased feeling in them. This means that you may not notice minor injuries to your feet that could lead to more serious problems. Noticing and addressing any potential problems early is the best way to prevent future foot problems. How to care for your feet Foot hygiene  Wash your feet daily with warm water and mild soap. Do not use hot water. Then, pat your feet and the areas between your toes until they are completely dry. Do not soak your feet as this can dry your skin.  Trim your toenails straight across. Do not dig under them or around the cuticle. File the edges of your nails with an emery board or nail file.  Apply a moisturizing lotion or petroleum jelly to the skin on your feet and to dry, brittle toenails. Use lotion that does not contain alcohol and is unscented. Do not apply lotion between your toes.   Shoes and socks  Wear clean socks or stockings every day. Make sure they are not too tight. Do not wear knee-high stockings since they may decrease blood flow to your legs.  Wear shoes that fit properly and have enough cushioning. Always look in your shoes before you put them on to be sure there are no objects inside.  To break in new shoes, wear them for just a few hours a day. This prevents injuries on your feet. Wounds, scrapes, corns, and calluses  Check your feet daily for blisters, cuts, bruises, sores, and redness. If you cannot see the bottom of your feet, use a mirror or ask someone for help.  Do not cut corns or calluses or try to remove them with medicine.  If you  find a minor scrape, cut, or break in the skin on your feet, keep it and the skin around it clean and dry. You may clean these areas with mild soap and water. Do not clean the area with peroxide, alcohol, or iodine.  If you have a wound, scrape, corn, or callus on your foot, look at it several times a day to make sure it is healing and not infected. Check for: ? Redness, swelling, or pain. ? Fluid or blood. ? Warmth. ? Pus or a bad smell.   General tips  Do not cross your legs. This may decrease blood flow to your feet.  Do not use heating pads or hot water bottles on your feet. They may burn your skin. If you have lost feeling in your feet or legs, you may not know this is happening until it is too late.  Protect your feet from hot and cold by wearing shoes, such as at the beach or on hot pavement.  Schedule a complete foot exam at least once a year (annually) or more often if you have foot problems. Report any cuts, sores, or bruises to your health care provider immediately. Where to find more information  American Diabetes Association: www.diabetes.org  Association of Diabetes Care & Education Specialists: www.diabeteseducator.org Contact a health care provider if:  You have a medical condition that increases your risk of infection and   you have any cuts, sores, or bruises on your feet.  You have an injury that is not healing.  You have redness on your legs or feet.  You feel burning or tingling in your legs or feet.  You have pain or cramps in your legs and feet.  Your legs or feet are numb.  Your feet always feel cold.  You have pain around any toenails. Get help right away if:  You have a wound, scrape, corn, or callus on your foot and: ? You have pain, swelling, or redness that gets worse. ? You have fluid or blood coming from the wound, scrape, corn, or callus. ? Your wound, scrape, corn, or callus feels warm to the touch. ? You have pus or a bad smell coming from  the wound, scrape, corn, or callus. ? You have a fever. ? You have a red line going up your leg. Summary  Check your feet every day for blisters, cuts, bruises, sores, and redness.  Apply a moisturizing lotion or petroleum jelly to the skin on your feet and to dry, brittle toenails.  Wear shoes that fit properly and have enough cushioning.  If you have foot problems, report any cuts, sores, or bruises to your health care provider immediately.  Schedule a complete foot exam at least once a year (annually) or more often if you have foot problems. This information is not intended to replace advice given to you by your health care provider. Make sure you discuss any questions you have with your health care provider. Document Revised: 01/21/2020 Document Reviewed: 01/21/2020 Elsevier Patient Education  2021 [employer]  

## 2020-10-19 NOTE — Patient Instructions (Signed)
[name] , Thank you for taking time to come for your Medicare Wellness Visit. I appreciate your ongoing commitment to your health goals. Please review the following plan we discussed and let me know if I can assist you in the future.   Screening recommendations/referrals: Colonoscopy: 07/11/2016, due 07/11/2021 Recommended yearly ophthalmology/optometry visit for glaucoma screening and checkup Recommended yearly dental visit for hygiene and checkup  Vaccinations: Influenza vaccine: decline Pneumococcal vaccine: decline Tdap vaccine: 10/31/2010, due 10/30/2020 Shingles vaccine: decline   Covid-19:  decline  Advanced directives: Advance directive discussed with you today. Even though you declined this today please call our office should you change your mind and we can give you the proper paperwork for you to fill out.  Conditions/risks identified: none  Next appointment: Follow up in one year for your annual wellness visit.   Preventive Care 72 Years and Older, Male Preventive care refers to lifestyle choices and visits with your health care provider that can promote health and wellness. What does preventive care include?  A yearly physical exam. This is also called an annual well check.  Dental exams once or twice a year.  Routine eye exams. Ask your health care provider how often you should have your eyes checked.  Personal lifestyle choices, including:  Daily care of your teeth and gums.  Regular physical activity.  Eating a healthy diet.  Avoiding tobacco and drug use.  Limiting alcohol use.  Practicing safe sex.  Taking low doses of aspirin every day.  Taking vitamin and mineral supplements as recommended by your health care provider. What happens during an annual well check? The services and screenings done by your health care provider during your annual well check will depend on your age, overall health, lifestyle risk factors, and family history of  disease. Counseling  Your health care provider may ask you questions about your:  Alcohol use.  Tobacco use.  Drug use.  Emotional well-being.  Home and relationship well-being.  Sexual activity.  Eating habits.  History of falls.  Memory and ability to understand (cognition).  Work and work Statistician. Screening  You may have the following tests or measurements:  Height, weight, and BMI.  Blood pressure.  Lipid and cholesterol levels. These may be checked every 5 years, or more frequently if you are over 72 years old.  Skin check.  Lung cancer screening. You may have this screening every year starting at age 72 if you have a 30-pack-year history of smoking and currently smoke or have quit within the past 15 years.  Fecal occult blood test (FOBT) of the stool. You may have this test every year starting at age 72.  Flexible sigmoidoscopy or colonoscopy. You may have a sigmoidoscopy every 5 years or a colonoscopy every 10 years starting at age 72.  Prostate cancer screening. Recommendations will vary depending on your family history and other risks.  Hepatitis C blood test.  Hepatitis B blood test.  Sexually transmitted disease (STD) testing.  Diabetes screening. This is done by checking your blood sugar (glucose) after you have not eaten for a while (fasting). You may have this done every 1-3 years.  Abdominal aortic aneurysm (AAA) screening. You may need this if you are a current or former smoker.  Osteoporosis. You may be screened starting at age 72 if you are at high risk. Talk with your health care provider about your test results, treatment options, and if necessary, the need for more tests. Vaccines  Your health care provider  may recommend certain vaccines, such as:  Influenza vaccine. This is recommended every year.  Tetanus, diphtheria, and acellular pertussis (Tdap, Td) vaccine. You may need a Td booster every 10 years.  Zoster vaccine. You may  need this after age 72.  Pneumococcal 13-valent conjugate (PCV13) vaccine. One dose is recommended after age 72.  Pneumococcal polysaccharide (PPSV23) vaccine. One dose is recommended after age 72. Talk to your health care provider about which screenings and vaccines you need and how often you need them. This information is not intended to replace advice given to you by your health care provider. Make sure you discuss any questions you have with your health care provider. Document Released: 07/29/2015 Document Revised: 03/21/2016 Document Reviewed: 05/03/2015 Elsevier Interactive Patient Education  2017 [place] Prevention in the Home Falls can cause injuries. They can happen to people of all ages. There are many things you can do to make your home safe and to help prevent falls. What can I do on the outside of my home?  Regularly fix the edges of walkways and driveways and fix any cracks.  Remove anything that might make you trip as you walk through a door, such as a raised step or threshold.  Trim any bushes or trees on the path to your home.  Use bright outdoor lighting.  Clear any walking paths of anything that might make someone trip, such as rocks or tools.  Regularly check to see if handrails are loose or broken. Make sure that both sides of any steps have handrails.  Any raised decks and porches should have guardrails on the edges.  Have any leaves, snow, or ice cleared regularly.  Use sand or salt on walking paths during winter.  Clean up any spills in your garage right away. This includes oil or grease spills. What can I do in the bathroom?  Use night lights.  Install grab bars by the toilet and in the tub and shower. Do not use towel bars as grab bars.  Use non-skid mats or decals in the tub or shower.  If you need to sit down in the shower, use a plastic, non-slip stool.  Keep the floor dry. Clean up any water that spills on the floor as soon as it  happens.  Remove soap buildup in the tub or shower regularly.  Attach bath mats securely with double-sided non-slip rug tape.  Do not have throw rugs and other things on the floor that can make you trip. What can I do in the bedroom?  Use night lights.  Make sure that you have a light by your bed that is easy to reach.  Do not use any sheets or blankets that are too big for your bed. They should not hang down onto the floor.  Have a firm chair that has side arms. You can use this for support while you get dressed.  Do not have throw rugs and other things on the floor that can make you trip. What can I do in the kitchen?  Clean up any spills right away.  Avoid walking on wet floors.  Keep items that you use a lot in easy-to-reach places.  If you need to reach something above you, use a strong step stool that has a grab bar.  Keep electrical cords out of the way.  Do not use floor polish or wax that makes floors slippery. If you must use wax, use non-skid floor wax.  Do not have throw rugs  and other things on the floor that can make you trip. What can I do with my stairs?  Do not leave any items on the stairs.  Make sure that there are handrails on both sides of the stairs and use them. Fix handrails that are broken or loose. Make sure that handrails are as long as the stairways.  Check any carpeting to make sure that it is firmly attached to the stairs. Fix any carpet that is loose or worn.  Avoid having throw rugs at the top or bottom of the stairs. If you do have throw rugs, attach them to the floor with carpet tape.  Make sure that you have a light switch at the top of the stairs and the bottom of the stairs. If you do not have them, ask someone to add them for you. What else can I do to help prevent falls?  Wear shoes that:  Do not have high heels.  Have rubber bottoms.  Are comfortable and fit you well.  Are closed at the toe. Do not wear sandals.  If you  use a stepladder:  Make sure that it is fully opened. Do not climb a closed stepladder.  Make sure that both sides of the stepladder are locked into place.  Ask someone to hold it for you, if possible.  Clearly mark and make sure that you can see:  Any grab bars or handrails.  First and last steps.  Where the edge of each step is.  Use tools that help you move around (mobility aids) if they are needed. These include:  Canes.  Walkers.  Scooters.  Crutches.  Turn on the lights when you go into a dark area. Replace any light bulbs as soon as they burn out.  Set up your furniture so you have a clear path. Avoid moving your furniture around.  If any of your floors are uneven, fix them.  If there are any pets around you, be aware of where they are.  Review your medicines with your doctor. Some medicines can make you feel dizzy. This can increase your chance of falling. Ask your doctor what other things that you can do to help prevent falls. This information is not intended to replace advice given to you by your health care provider. Make sure you discuss any questions you have with your health care provider. Document Released: 04/28/2009 Document Revised: 12/08/2015 Document Reviewed: 08/06/2014 Elsevier Interactive Patient Education  2017 [employer].

## 2020-10-19 NOTE — Progress Notes (Signed)
This visit occurred during the SARS-CoV-2 public health emergency.  Safety protocols were in place, including screening questions prior to the visit, additional usage of staff PPE, and extensive cleaning of exam room while observing appropriate contact time as indicated for disinfecting solutions.  Subjective:   Connor Becker. is a 70 y.o. male who presents for Medicare Annual/Subsequent preventive examination.  Review of Systems     Cardiac Risk Factors include: advanced age (>4men, >55 women);diabetes mellitus;dyslipidemia;male gender;obesity (BMI >30kg/m2);sedentary lifestyle     Objective:    Today's Vitals   10/19/20 0943 10/19/20 0948  BP: 122/80   Pulse: 84   Temp: 97.9 F (36.6 C)   TempSrc: Oral   SpO2: 98%   Weight: 299 lb (135.6 kg)   Height: 6\' 1"  (1.854 m)   PainSc:  3    Body mass index is 39.45 kg/m.  Advanced Directives 10/19/2020 09/23/2020 07/09/2020 07/01/2020 07/01/2020 10/07/2019 03/31/2019  Does Patient Have a Medical Advance Directive? No No No No No No No  Would patient like information on creating a medical advance directive? - - No - Patient declined No - Patient declined - No - Patient declined -    Current Medications (verified) Outpatient Encounter Medications as of 10/19/2020  Medication Sig  . Continuous Blood Gluc Receiver (FREESTYLE LIBRE 2 READER) DEVI 1 Device by Does not apply route as directed.  . Continuous Blood Gluc Sensor (FREESTYLE LIBRE 2 SENSOR) MISC 1 Device by Does not apply route in the morning, at noon, and at bedtime.  Marland Kitchen EPINEPHrine 0.3 mg/0.3 mL IJ SOAJ injection Inject 0.3 mg into the muscle as needed for anaphylaxis.  Marland Kitchen glipiZIDE (GLUCOTROL) 5 MG tablet Take 1 tablet (5 mg total) by mouth daily before breakfast.  . HYDROcodone-acetaminophen (NORCO/VICODIN) 5-325 MG tablet Take 2 tablets by mouth every 4 (four) hours as needed.  . latanoprost (XALATAN) 0.005 % ophthalmic solution Place 1 drop into both eyes daily.  Marland Kitchen  levothyroxine (SYNTHROID) 112 MCG tablet Take 1 tablet by mouth once daily  . loperamide (IMODIUM) 2 MG capsule Take 1 capsule (2 mg total) by mouth 4 (four) times daily as needed for diarrhea or loose stools.  . Magnesium 400 MG CAPS Take 400 mg by mouth at bedtime.  . methocarbamol (ROBAXIN) 750 MG tablet Take 750 mg by mouth 4 (four) times daily as needed for muscle spasms.  . Multiple Vitamins-Minerals (MULTIVITAMIN/EXTRA VITAMIN D3) CHEW Chew 1 tablet by mouth daily.  Marland Kitchen omeprazole (PRILOSEC) 20 MG capsule Take 20 mg by mouth daily as needed (acid reflux).  Glory Rosebush VERIO test strip USE STRIP TO CHECK BLOOD SUGAR UP TO 4 TIMES PER DAY  . promethazine (PHENERGAN) 12.5 MG tablet Take 1 tablet (12.5 mg total) by mouth every 6 (six) hours as needed for nausea or vomiting.  . pseudoephedrine (SUDAFED) 30 MG tablet Take 60 mg by mouth 2 (two) times daily as needed for congestion.  . Semaglutide (RYBELSUS) 3 MG TABS Take 3 mg by mouth daily with breakfast.  . tadalafil (CIALIS) 5 MG tablet Take 5 mg by mouth daily.  Marland Kitchen testosterone enanthate (DELATESTRYL) 200 MG/ML injection Inject 200 mg into the muscle every 14 (fourteen) days.  Marland Kitchen timolol (TIMOPTIC) 0.5 % ophthalmic solution Place 1 drop into both eyes daily.  . Turmeric 500 MG CAPS Take 500 mg by mouth daily.  . vitamin C (ASCORBIC ACID) 500 MG tablet Take 1,000 mg by mouth daily.  . Vitamin D, Ergocalciferol, (DRISDOL) 1.25 MG (50000  UNIT) CAPS capsule Take one capsule by mouth on Tues/Fridays (Patient taking differently: Take 50,000 Units by mouth See admin instructions. Take one capsule by mouth on Tues/Fridays)  . dicyclomine (BENTYL) 20 MG tablet Take 1 tablet (20 mg total) by mouth 2 (two) times daily. (Patient not taking: Reported on 10/19/2020)  . predniSONE (DELTASONE) 5 MG tablet Take 20 mg on day 1, 10 mg on day 2 and 5 mg on day 3. (Patient not taking: Reported on 10/19/2020)   No facility-administered encounter medications on file as of  10/19/2020.    Allergies (verified) Horseradish [armoracia rusticana ext (horseradish)], Lipitor [atorvastatin], Metformin and related, and Soy allergy   History: Past Medical History:  Diagnosis Date  . ALLERGIC RHINITIS   . Chronic pain of both knees   . DJD (degenerative joint disease) of knee    right  . DM2 (diabetes mellitus, type 2) ([place])   . GERD   . Glaucoma   . History of nuclear stress test    Myoview 12/17: EF 53, no ischemia, Low Risk  . HYPERLIPIDEMIA    myalgias from Lipitor  . HYPOGONADISM, MALE   . Hypothyroidism   . INSOMNIA-SLEEP DISORDER-UNSPEC   . Joint pain   . Obesity   . OSTEOARTHRITIS   . SLEEP APNEA, OBSTRUCTIVE    no CPAP; repeat sleep test pending 07/2016  . Unspecified hypothyroidism 12/04/2013  . Vitamin D deficiency    Past Surgical History:  Procedure Laterality Date  . NASAL SEPTUM SURGERY    . TONSILLECTOMY    . UVULOPALATOPHARYNGOPLASTY    . VASECTOMY     Family History  Problem Relation Age of Onset  . Diabetes Maternal Uncle   . Heart attack Maternal Uncle   . Hypertension Other   . Prostate cancer Other   . Heart disease Other   . Heart attack Mother 54       s/p MI  . Other Mother        pacemaker  . Diabetes Mother   . High blood pressure Mother   . High Cholesterol Mother   . Thyroid disease Mother   . Obesity Mother   . Macular degeneration Father   . Thyroid disease Father   . Colon cancer Neg Hx    Social History   Socioeconomic History  . Marital status: Married    Spouse name: Connor Becker  . Number of children: Not on file  . Years of education: Not on file  . Highest education level: Not on file  Occupational History  . Occupation: retired  Tobacco Use  . Smoking status: Never Smoker  . Smokeless tobacco: Never Used  Vaping Use  . Vaping Use: Never used  Substance and Sexual Activity  . Alcohol use: Not Currently    Comment: occassional beer  . Drug use: No  . Sexual activity: Yes  Other Topics  Concern  . 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   Social Determinants of Health   Financial Resource Strain: Low Risk   . Difficulty of Paying Living Expenses: Not hard at all  Food Insecurity: No Food Insecurity  . Worried About Charity fundraiser in the Last Year: Never true  . Ran Out of Food in the Last Year: Never true  Transportation Needs: No Transportation Needs  . Lack  of Transportation (Medical): No  . Lack of Transportation (Non-Medical): No  Physical Activity: Inactive  . Days of Exercise per Week: 0 days  . Minutes of Exercise per Session: 0 min  Stress: No Stress Concern Present  . Feeling of Stress : Not at all  Social Connections: Not on file    Tobacco Counseling Counseling given: Not Answered   Clinical Intake:  Pre-visit preparation completed: Yes  Pain : 0-10 Pain Score: 3  Pain Type: Acute pain Pain Location: Shoulder Pain Orientation: Right Pain Descriptors / Indicators: Aching Pain Onset: 1 to 4 weeks ago Pain Frequency: Constant     Nutritional Status: BMI > 30  Obese Nutritional Risks: None Diabetes: Yes  How often do you need to have someone help you when you read instructions, pamphlets, or other written materials from your doctor or pharmacy?: 1 - Never What is the last grade level you completed in school?: college  Diabetic? Yes Nutrition Risk Assessment:  Has the patient had any N/V/D within the last 2 months?  No  Does the patient have any non-healing wounds?  No  Has the patient had any unintentional weight loss or weight gain?  No   Diabetes:  Is the patient diabetic?  Yes  If diabetic, was a CBG obtained today?  No  Did the patient bring in their glucometer from home?  No  How often do you monitor your CBG's? 3-4 daily.   Financial Strains and Diabetes Management:  Are you having  any financial strains with the device, your supplies or your medication? No .  Does the patient want to be seen by Chronic Care Management for management of their diabetes?  No  Would the patient like to be referred to a Nutritionist or for Diabetic Management?  No   Diabetic Exams:  Diabetic Eye Exam: Completed 03/31/2020 Diabetic Foot Exam: Completed 06/20/2020   Interpreter Needed?: No  Information entered by :: NAllen LPN   Activities of Daily Living In your present state of health, do you have any difficulty performing the following activities: 10/19/2020 07/01/2020  Hearing? N N  Vision? N N  Difficulty concentrating or making decisions? N N  Walking or climbing stairs? Y Y  Comment - has knee issues, walks them slowly  Dressing or bathing? N N  Doing errands, shopping? N Y  Comment - not able to due to this illness  Preparing Food and eating ? N -  Using the Toilet? N -  In the past six months, have you accidently leaked urine? N -  Do you have problems with loss of bowel control? N -  Managing your Medications? N -  Managing your Finances? N -  Housekeeping or managing your Housekeeping? N -  Some recent data might be hidden    Patient Care Team: Glendale Chard, MD as PCP - General (Internal Medicine)  Indicate any recent Medical Services you may have received from other than [facility] providers in the past year (date may be approximate).     Assessment:   This is a routine wellness examination for Dj.  Hearing/Vision screen No exam data present  Dietary issues and exercise activities discussed: Current Exercise Habits: The patient does not participate in regular exercise at present  Goals    . Patient Stated     10/19/2020, wants to weigh 220-225    . Weight (lb) < 200 lb (90.7 kg)     03/31/2019, wants to weigh 210-220 pounds    . Weight (  lb) < 200 lb (90.7 kg)     10/07/2019, wants to get down to 220 pounds      Depression Screen PHQ 2/9 Scores 10/19/2020  01/05/2020 10/07/2019 03/31/2019 02/03/2019 07/30/2018 07/01/2018  PHQ - 2 Score 0 0 0 0 0 0 0  PHQ- 9 Score - - 0 0 - - -  Exception Documentation - - - - - - -    Fall Risk Fall Risk  10/19/2020 01/05/2020 10/07/2019 03/31/2019 03/11/2019  Falls in the past year? 1 0 0 0 0  Comment fell off ladder - - - -  Number falls in past yr: 0 0 - - -  Injury with Fall? 1 0 - - -  Comment dislocated shoulder - - - -  Risk for fall due to : Medication side effect - - Medication side effect -  Follow up Falls evaluation completed;Education provided;Falls prevention discussed - Falls evaluation completed;Education provided;Falls prevention discussed Falls evaluation completed;Education provided;Falls prevention discussed -    FALL RISK PREVENTION PERTAINING TO THE HOME:  Any stairs in or around the home? Yes  If so, are there any without handrails? No  Home free of loose throw rugs in walkways, pet beds, electrical cords, etc? Yes  Adequate lighting in your home to reduce risk of falls? Yes   ASSISTIVE DEVICES UTILIZED TO PREVENT FALLS:  Life alert? No  Use of a cane, walker or w/c? No  Grab bars in the bathroom? No  Shower chair or bench in shower? No  Elevated toilet seat or a handicapped toilet? No   TIMED UP AND GO:  Was the test performed? No . .   Gait steady and fast without use of assistive device  Cognitive Function:     6CIT Screen 10/19/2020 10/07/2019 03/31/2019  What Year? 0 points 0 points 0 points  What month? 0 points 0 points 0 points  What time? 0 points 0 points 0 points  Count back from 20 0 points 0 points 0 points  Months in reverse 0 points 0 points 0 points  Repeat phrase 0 points 0 points 0 points  Total Score 0 0 0    Immunizations Immunization History  Administered Date(s) Administered  . Tdap 10/31/2010    TDAP status: Up to date  Flu Vaccine status: Declined, Education has been provided regarding the importance of this vaccine but patient still declined.  Advised may receive this vaccine at local pharmacy or Health Dept. Aware to provide a copy of the vaccination record if obtained from local pharmacy or Health Dept. Verbalized acceptance and understanding.  Pneumococcal vaccine status: Declined,  Education has been provided regarding the importance of this vaccine but patient still declined. Advised may receive this vaccine at local pharmacy or Health Dept. Aware to provide a copy of the vaccination record if obtained from local pharmacy or Health Dept. Verbalized acceptance and understanding.   Covid-19 vaccine status: Declined, Education has been provided regarding the importance of this vaccine but patient still declined. Advised may receive this vaccine at local pharmacy or Health Dept.or vaccine clinic. Aware to provide a copy of the vaccination record if obtained from local pharmacy or Health Dept. Verbalized acceptance and understanding.  Qualifies for Shingles Vaccine? Yes   Zostavax completed No   Shingrix Completed?: No.    Education has been provided regarding the importance of this vaccine. Patient has been advised to call insurance company to determine out of pocket expense if they have not yet received this vaccine.  Advised may also receive vaccine at local pharmacy or Health Dept. Verbalized acceptance and understanding.  Screening Tests Health Maintenance  Topic Date Due  . COVID-19 Vaccine (1) Never done  . PNA vac Low Risk Adult (1 of 2 - PCV13) 03/31/2021 (Originally 01/26/2014)  . TETANUS/TDAP  10/30/2020  . HEMOGLOBIN A1C  12/19/2020  . INFLUENZA VACCINE  02/13/2021  . FOOT EXAM  06/20/2021  . URINE MICROALBUMIN  06/20/2021  . COLONOSCOPY (Pts 45-21yrs Insurance coverage will need to be confirmed)  07/11/2021  . OPHTHALMOLOGY EXAM  10/12/2021  . Hepatitis C Screening  Completed  . HPV VACCINES  Aged Out    Health Maintenance  Health Maintenance Due  Topic Date Due  . COVID-19 Vaccine (1) Never done    Colorectal  cancer screening: Type of screening: Colonoscopy. Completed 07/11/2016. Repeat every 5 years  Lung Cancer Screening: (Low Dose CT Chest recommended if Age 66-80 years, 30 pack-year currently smoking OR have quit w/in 15years.) does not qualify.   Lung Cancer Screening Referral: no  Additional Screening:  Hepatitis C Screening: does qualify; Completed 11/17/2015  Vision Screening: Recommended annual ophthalmology exams for early detection of glaucoma and other disorders of the eye. Is the patient up to date with their annual eye exam?  Yes  Who is the provider or what is the name of the office in which the patient attends annual eye exams? [facility] If pt is not established with a provider, would they like to be referred to a provider to establish care? No .   Dental Screening: Recommended annual dental exams for proper oral hygiene  Community Resource Referral / Chronic Care Management: CRR required this visit?  No   CCM required this visit?  No      Plan:     I have personally reviewed and noted the following in the patient's chart:   . Medical and social history . Use of alcohol, tobacco or illicit drugs  . Current medications and supplements . Functional ability and status . Nutritional status . Physical activity . Advanced directives . List of other physicians . Hospitalizations, surgeries, and ER visits in previous 12 months . Vitals . Screenings to include cognitive, depression, and falls . Referrals and appointments  In addition, I have reviewed and discussed with patient certain preventive protocols, quality metrics, and best practice recommendations. A written personalized care plan for preventive services as well as general preventive health recommendations were provided to patient.     Kellie Simmering, LPN   07/18/2438   Nurse Notes:

## 2020-10-19 NOTE — Progress Notes (Signed)
I,Katawbba Wiggins,acting as a Education administrator for Maximino Greenland, MD.,have documented all relevant documentation on the behalf of Maximino Greenland, MD,as directed by  Maximino Greenland, MD while in the presence of Maximino Greenland, MD.  This visit occurred during the SARS-CoV-2 public health emergency.  Safety protocols were in place, including screening questions prior to the visit, additional usage of staff PPE, and extensive cleaning of exam room while observing appropriate contact time as indicated for disinfecting solutions.  Subjective:     Patient ID: Connor Becker , male    DOB: 02-Oct-1950 , 70 y.o.   MRN: [id number]   Chief Complaint  Patient presents with  . Diabetes  . Hypothyroidism    HPI  Patient presents today for thyroid f/u. He reports compliance with meds. He admits he is not exercising as much as he should.   Thyroid Problem Presents for follow-up visit. Symptoms include fatigue. Patient reports no cold intolerance, constipation, leg swelling or visual change. The symptoms have been stable.     Past Medical History:  Diagnosis Date  . ALLERGIC RHINITIS   . Chronic pain of both knees   . DJD (degenerative joint disease) of knee    right  . DM2 (diabetes mellitus, type 2) ([place])   . GERD   . Glaucoma   . History of nuclear stress test    Myoview 12/17: EF 53, no ischemia, Low Risk  . HYPERLIPIDEMIA    myalgias from Lipitor  . HYPOGONADISM, MALE   . Hypothyroidism   . INSOMNIA-SLEEP DISORDER-UNSPEC   . Joint pain   . Obesity   . OSTEOARTHRITIS   . SLEEP APNEA, OBSTRUCTIVE    no CPAP; repeat sleep test pending 07/2016  . Unspecified hypothyroidism 12/04/2013  . Vitamin D deficiency      Family History  Problem Relation Age of Onset  . Diabetes Maternal Uncle   . Heart attack Maternal Uncle   . Hypertension Other   . Prostate cancer Other   . Heart disease Other   . Heart attack Mother 61       s/p MI  . Other Mother        pacemaker  . Diabetes  Mother   . High blood pressure Mother   . High Cholesterol Mother   . Thyroid disease Mother   . Obesity Mother   . Macular degeneration Father   . Thyroid disease Father   . Colon cancer Neg Hx      Current Outpatient Medications:  .  Continuous Blood Gluc Receiver (FREESTYLE LIBRE 2 READER) DEVI, 1 Device by Does not apply route as directed., Disp: 1 each, Rfl: 0 .  Continuous Blood Gluc Sensor (FREESTYLE LIBRE 2 SENSOR) MISC, 1 Device by Does not apply route in the morning, at noon, and at bedtime., Disp: 2 each, Rfl: 6 .  dicyclomine (BENTYL) 20 MG tablet, Take 1 tablet (20 mg total) by mouth 2 (two) times daily., Disp: 20 tablet, Rfl: 0 .  EPINEPHrine 0.3 mg/0.3 mL IJ SOAJ injection, Inject 0.3 mg into the muscle as needed for anaphylaxis., Disp: 1 each, Rfl: 0 .  glipiZIDE (GLUCOTROL) 5 MG tablet, Take 1 tablet (5 mg total) by mouth daily before breakfast., Disp: 30 tablet, Rfl: 3 .  HYDROcodone-acetaminophen (NORCO/VICODIN) 5-325 MG tablet, Take 2 tablets by mouth every 4 (four) hours as needed., Disp: 6 tablet, Rfl: 0 .  latanoprost (XALATAN) 0.005 % ophthalmic solution, Place 1 drop into both eyes daily., Disp: ,  Rfl:  .  levothyroxine (SYNTHROID) 112 MCG tablet, Take 1 tablet by mouth once daily, Disp: 90 tablet, Rfl: 0 .  loperamide (IMODIUM) 2 MG capsule, Take 1 capsule (2 mg total) by mouth 4 (four) times daily as needed for diarrhea or loose stools., Disp: 12 capsule, Rfl: 0 .  Magnesium 400 MG CAPS, Take 400 mg by mouth at bedtime., Disp: 90 capsule, Rfl: 1 .  methocarbamol (ROBAXIN) 750 MG tablet, Take 750 mg by mouth 4 (four) times daily as needed for muscle spasms., Disp: , Rfl:  .  Multiple Vitamins-Minerals (MULTIVITAMIN/EXTRA VITAMIN D3) CHEW, Chew 1 tablet by mouth daily., Disp: , Rfl:  .  omeprazole (PRILOSEC) 20 MG capsule, Take 20 mg by mouth daily as needed (acid reflux)., Disp: , Rfl:  .  ONETOUCH VERIO test strip, USE STRIP TO CHECK BLOOD SUGAR UP TO 4 TIMES PER  DAY, Disp: 200 each, Rfl: 11 .  predniSONE (DELTASONE) 5 MG tablet, Take 20 mg on day 1, 10 mg on day 2 and 5 mg on day 3., Disp: 7 tablet, Rfl: 0 .  promethazine (PHENERGAN) 12.5 MG tablet, Take 1 tablet (12.5 mg total) by mouth every 6 (six) hours as needed for nausea or vomiting., Disp: 6 tablet, Rfl: 0 .  pseudoephedrine (SUDAFED) 30 MG tablet, Take 60 mg by mouth 2 (two) times daily as needed for congestion., Disp: , Rfl:  .  Semaglutide (RYBELSUS) 7 MG TABS, Take 7 mg by mouth daily before breakfast., Disp: 30 tablet, Rfl: 6 .  tadalafil (CIALIS) 5 MG tablet, Take 5 mg by mouth daily., Disp: , Rfl: 11 .  testosterone enanthate (DELATESTRYL) 200 MG/ML injection, Inject 200 mg into the muscle every 14 (fourteen) days., Disp: , Rfl:  .  timolol (TIMOPTIC) 0.5 % ophthalmic solution, Place 1 drop into both eyes daily., Disp: , Rfl: 2 .  Turmeric 500 MG CAPS, Take 500 mg by mouth daily., Disp: , Rfl:  .  vitamin C (ASCORBIC ACID) 500 MG tablet, Take 1,000 mg by mouth daily., Disp: , Rfl:  .  Vitamin D, Ergocalciferol, (DRISDOL) 1.25 MG (50000 UNIT) CAPS capsule, Take 1 capsule (50,000 Units total) by mouth See admin instructions. Take one capsule by mouth on Tues/Fridays, Disp: 24 capsule, Rfl: 0   Allergies  Allergen Reactions  . Horseradish [Armoracia Rusticana Ext (Horseradish)]   . Lipitor [Atorvastatin] Other (See Comments)    Leg cramps  . Metformin And Related Other (See Comments)    GI upset  . Soy Allergy Other (See Comments)    Pt stated soy sauce causes angioedema, but has added some soy products back in to diet and no angioedema.      Review of Systems  Constitutional: Positive for fatigue.  Respiratory: Negative.   Cardiovascular: Negative.   Gastrointestinal: Negative.  Negative for constipation.  Endocrine: Negative for cold intolerance.  Psychiatric/Behavioral: Negative.   All other systems reviewed and are negative.    Today's Vitals   10/19/20 1002  BP: 122/80   Pulse: 84  Temp: 97.9 F (36.6 C)  TempSrc: Oral  Weight: 299 lb (135.6 kg)  Height: _0  (1.854 m)   Body mass index is 39.45 kg/m.  Wt Readings from Last 3 Encounters:  10/19/20 300 lb (136.1 kg)  10/19/20 299 lb (135.6 kg)  10/19/20 299 lb (135.6 kg)   Objective:  Physical Exam Vitals and nursing note reviewed.  Constitutional:      Appearance: Normal appearance. He is obese.  HENT:  Head: Normocephalic and atraumatic.     Nose:     Comments: Masked     Mouth/Throat:     Comments: Masked  Cardiovascular:     Rate and Rhythm: Normal rate and regular rhythm.     Heart sounds: Normal heart sounds.  Pulmonary:     Effort: Pulmonary effort is normal.     Breath sounds: Normal breath sounds.  Musculoskeletal:     Cervical back: Normal range of motion.  Skin:    General: Skin is warm.  Neurological:     General: No focal deficit present.     Mental Status: He is alert.  Psychiatric:        Mood and Affect: Mood normal.         Assessment And Plan:     1. Primary hypothyroidism Comments: I will check TSh and adjust meds as needed.  - TSH  2. Type 2 diabetes mellitus with hyperglycemia, without long-term current use of insulin ([place]) Comments: He was given sample Libre sensor to use. Will try to get this approved by his insurance.  - Hemoglobin A1c - CMP14+EGFR  3. Intermittent claudication ([place]) Comments: Importance of regular exercise was discussed with the patient. Also encouraged to folow a heart healthy lifestyle. Last ABI results reviewed from 12/21.   4. Hypogonadism in male Comments: I will check testosterone levels and forward information to his Urologist for their review.  - Testosterone,Free and Total  5. Class 2 severe obesity due to excess calories with serious comorbidity and body mass index (BMI) of 39.0 to 39.9 in adult [facility]) Comments: He is encouraged to initially strive for BMI less than 45 to decrease cardiac risk. Advised to aim for at  least 150 minutes of exercise per week.   6. Personal history of colonic polyps Comments: I will refer him to Dr. Fuller Plan for repeat colonoscopy.  - Ambulatory referral to Gastroenterology  Patient was given opportunity to ask questions. Patient verbalized understanding of the plan and was able to repeat key elements of the plan. All questions were answered to their satisfaction.   I, Maximino Greenland, MD, have reviewed all documentation for this visit. The documentation on 11/05/20 for the exam, diagnosis, procedures, and orders are all accurate and complete.   IF YOU HAVE BEEN REFERRED TO A SPECIALIST, IT MAY TAKE 1-2 WEEKS TO SCHEDULE/PROCESS THE REFERRAL. IF YOU HAVE NOT HEARD FROM US/SPECIALIST IN TWO WEEKS, PLEASE GIVE [place] A CALL AT [phone] X [phone].   THE PATIENT IS ENCOURAGED TO PRACTICE SOCIAL DISTANCING DUE TO THE COVID-19 PANDEMIC.

## 2020-10-19 NOTE — Progress Notes (Signed)
`    Name: Connor Becker.  Age/ Sex: 70 y.o., male   MRN/ DOB: [id number], 04/21/1951     PCP: Glendale Chard, MD   Reason for Endocrinology Evaluation: Type 2 Diabetes Mellitus  Initial Endocrine Consultative Visit: 04/13/2020    PATIENT IDENTIFIER: Connor Becker. is a 70 y.o. male with a past medical history of T2DM,OSA, hypogonadism and dyslipidemia. The patient has followed with Endocrinology clinic since 04/13/2020 for consultative assistance with management of his diabetes.  DIABETIC HISTORY:  Connor Becker was diagnosed with DM2 in 2014,Metformin- GI side effect, Farxiga -severe urinary frequency preventing him from sleeping at night.  His hemoglobin A1c has ranged from 6.6%  in 2020, peaking at 7.7% in 2021.  On his initial visit to our clinic his A1c was ,  He was not on any glycemic medications. Started Rybelsus  SUBJECTIVE:   During the last visit (08/15/2020): A1c 7.6 % Continued Rybelsus and started Glipizide   Today (10/19/2020): Connor Becker is here for a follow up on diabetes.  He checks his blood sugars multiple times a day through CGM. The patient has not had hypoglycemic episodes since the last clinic visit.   Has noted a discrepancy between freestyle libre and finger stick  Denies nausea or vomiting  but has occasional cramps after eating certain meals   He has had recent ED visits for fall  Had Hives in December and was given prednisone . He was note to have an elevate cortisol in 06/2020 at 24.9 ugdl    HOME DIABETES REGIMEN:  Rybelsus 3 mg daily  Glipizide 5 mg daily      Statin: yes ACE-I/ARB: yes   CONTINUOUS GLUCOSE MONITORING RECORD INTERPRETATION    Dates of Recording:   Sensor description:3/6-10/15/2020  Results statistics:   CGM use % of time 75  Average and SD 188/ 20.5  Time in range    48    %  % Time Above 180 45  % Time above 250 7  % Time Below target 0      Glycemic patterns summary: hyperglycemia during the  day, within goal overnight   Hyperglycemic episodes  Post prandial   Hypoglycemic episodes occurred N.A  Overnight periods: stable        METER DOWNLOAD SUMMARY: Date range evaluated: 3/23-10/19/2020 Average Number Tests/Day = 0.4 Overall Mean FS Glucose = 180 Standard Deviation = 34  BG Ranges: Low = 135 High = 216   Hypoglycemic Events/30 Days: BG < 50 = 0 Episodes of symptomatic severe hypoglycemia = 0    DIABETIC COMPLICATIONS: Microvascular complications:    Denies: CKD, retinopathy, neuropathy  Last Eye Exam: Completed 03/2020  Macrovascular complications:    Denies: CAD, CVA, PVD   HISTORY:  Past Medical History:  Past Medical History:  Diagnosis Date  . ALLERGIC RHINITIS   . Chronic pain of both knees   . DJD (degenerative joint disease) of knee    right  . DM2 (diabetes mellitus, type 2) ([place])   . GERD   . Glaucoma   . History of nuclear stress test    Myoview 12/17: EF 53, no ischemia, Low Risk  . HYPERLIPIDEMIA    myalgias from Lipitor  . HYPOGONADISM, MALE   . Hypothyroidism   . INSOMNIA-SLEEP DISORDER-UNSPEC   . Joint pain   . Obesity   . OSTEOARTHRITIS   . SLEEP APNEA, OBSTRUCTIVE    no CPAP; repeat sleep test pending 07/2016  . Unspecified hypothyroidism  12/04/2013  . Vitamin D deficiency    Past Surgical History:  Past Surgical History:  Procedure Laterality Date  . NASAL SEPTUM SURGERY    . TONSILLECTOMY    . UVULOPALATOPHARYNGOPLASTY    . VASECTOMY      Social History:  reports that he has never smoked. He has never used smokeless tobacco. He reports previous alcohol use. He reports that he does not use drugs. Family History:  Family History  Problem Relation Age of Onset  . Diabetes Maternal Uncle   . Heart attack Maternal Uncle   . Hypertension Other   . Prostate cancer Other   . Heart disease Other   . Heart attack Mother 90       s/p MI  . Other Mother        pacemaker  . Diabetes Mother   . High blood  pressure Mother   . High Cholesterol Mother   . Thyroid disease Mother   . Obesity Mother   . Macular degeneration Father   . Thyroid disease Father   . Colon cancer Neg Hx      HOME MEDICATIONS: Allergies as of 10/19/2020      Reactions   Horseradish [armoracia Rusticana Ext (horseradish)]    Lipitor [atorvastatin] Other (See Comments)   Leg cramps   Metformin And Related Other (See Comments)   GI upset   Soy Allergy Other (See Comments)   Pt stated soy sauce causes angioedema, but has added some soy products back in to diet and no angioedema.       Medication List       Accurate as of October 19, 2020  2:01 PM. If you have any questions, ask your nurse or doctor.        dicyclomine 20 MG tablet Commonly known as: BENTYL Take 1 tablet (20 mg total) by mouth 2 (two) times daily.   EPINEPHrine 0.3 mg/0.3 mL Soaj injection Commonly known as: EPI-PEN Inject 0.3 mg into the muscle as needed for anaphylaxis.   FreeStyle Libre 2 Reader Devi 1 Device by Does not apply route as directed.   FreeStyle Libre 2 Sensor Misc 1 Device by Does not apply route in the morning, at noon, and at bedtime.   glipiZIDE 5 MG tablet Commonly known as: GLUCOTROL Take 1 tablet (5 mg total) by mouth daily before breakfast.   HYDROcodone-acetaminophen 5-325 MG tablet Commonly known as: NORCO/VICODIN Take 2 tablets by mouth every 4 (four) hours as needed.   latanoprost 0.005 % ophthalmic solution Commonly known as: XALATAN Place 1 drop into both eyes daily.   levothyroxine 112 MCG tablet Commonly known as: SYNTHROID Take 1 tablet by mouth once daily   loperamide 2 MG capsule Commonly known as: IMODIUM Take 1 capsule (2 mg total) by mouth 4 (four) times daily as needed for diarrhea or loose stools.   Magnesium 400 MG Caps Take 400 mg by mouth at bedtime.   methocarbamol 750 MG tablet Commonly known as: ROBAXIN Take 750 mg by mouth 4 (four) times daily as needed for muscle spasms.    Multivitamin/Extra Vitamin D3 Chew Chew 1 tablet by mouth daily.   omeprazole 20 MG capsule Commonly known as: PRILOSEC Take 20 mg by mouth daily as needed (acid reflux).   OneTouch Verio test strip Generic drug: glucose blood USE STRIP TO CHECK BLOOD SUGAR UP TO 4 TIMES PER DAY   predniSONE 5 MG tablet Commonly known as: DELTASONE Take 20 mg on day 1, 10 mg  on day 2 and 5 mg on day 3.   promethazine 12.5 MG tablet Commonly known as: PHENERGAN Take 1 tablet (12.5 mg total) by mouth every 6 (six) hours as needed for nausea or vomiting.   pseudoephedrine 30 MG tablet Commonly known as: SUDAFED Take 60 mg by mouth 2 (two) times daily as needed for congestion.   Rybelsus 3 MG Tabs Generic drug: Semaglutide Take 3 mg by mouth daily with breakfast.   tadalafil 5 MG tablet Commonly known as: CIALIS Take 5 mg by mouth daily.   testosterone enanthate 200 MG/ML injection Commonly known as: DELATESTRYL Inject 200 mg into the muscle every 14 (fourteen) days.   timolol 0.5 % ophthalmic solution Commonly known as: TIMOPTIC Place 1 drop into both eyes daily.   Turmeric 500 MG Caps Take 500 mg by mouth daily.   vitamin C 500 MG tablet Commonly known as: ASCORBIC ACID Take 1,000 mg by mouth daily.   Vitamin D (Ergocalciferol) 1.25 MG (50000 UNIT) Caps capsule Commonly known as: DRISDOL Take 1 capsule (50,000 Units total) by mouth See admin instructions. Take one capsule by mouth on Tues/Fridays        OBJECTIVE:   Vital Signs: BP 126/82   Pulse 88   Ht 6\' 1"  (1.854 m)   Wt 300 lb (136.1 kg)   SpO2 98%   BMI 39.58 kg/m   Wt Readings from Last 3 Encounters:  10/19/20 300 lb (136.1 kg)  10/19/20 299 lb (135.6 kg)  10/19/20 299 lb (135.6 kg)     Exam: General: Pt appears well and is in NAD  Lungs: Clear with good BS bilat with no rales, rhonchi, or wheezes  Heart: RRR with normal S1 and S2 and no gallops; no murmurs; no rub  Extremities: Trace pretibial edema.    Neuro: MS is good with appropriate affect, pt is alert and Ox3   DM foot exam: 04/13/2020  The skin of the feet is intact without sores or ulcerations. The pedal pulses are 2+ on right and 2+ on left. The sensation is intact to a screening 5.07, 10 gram monofilament bilaterally     DATA REVIEWED:  Lab Results  Component Value Date   HGBA1C 7.6 (H) 06/20/2020   HGBA1C 7.7 (H) 03/31/2020   HGBA1C 7.3 (H) 01/06/2020   Lab Results  Component Value Date   MICROALBUR 80 06/20/2020   LDLCALC 113 (H) 01/06/2020   CREATININE 0.98 07/09/2020   Lab Results  Component Value Date   MICRALBCREAT <30 06/20/2020     Lab Results  Component Value Date   CHOL 196 01/06/2020   HDL 35 (L) 01/06/2020   LDLCALC 113 (H) 01/06/2020   LDLDIRECT 148.0 11/17/2015   TRIG 276 (H) 01/06/2020   CHOLHDL 5.1 (H) 03/11/2019         ASSESSMENT / PLAN / RECOMMENDATIONS:   1) Type 2 Diabetes Mellitus, Sub-Optimally controlled, Without complications - Most recent A1c of pending through PCP's office. Goal A1c < 7.0 %.     -He is intolerant to metformin, he was on Farxiga but this caused frequency preventing him from sleeping at night, he is not too keen on restarting.  He has been using freestyle libre ,but noted discrepancy between CGM reading and fingersticks  Will increase Rybelsus as below, he was advised to take it before breakfast 9 has been taking  It at night )    MEDICATIONS:   -Increase  Rybelsus 7 mg , 1 tablet with Breakfast  -  Continue Glipizide 5 mg , 1 tablet before Breakfast     EDUCATION / INSTRUCTIONS:  BG monitoring instructions: Patient is instructed to check his blood sugars 2 times a day, fasting and bedtime .  Call [facility] if: BG persistently < 70  . I reviewed the Rule of 15 for the treatment of hypoglycemia in detail with the patient. Literature supplied.    2) Diabetic complications:   Eye: Does not have known diabetic retinopathy.    Neuro/ Feet: Does not have known diabetic peripheral neuropathy .   Renal: Patient does not have known baseline CKD.   3) Elevated Serum cortisol : - Serum cortisol elevated at 24.9 ug/dL on 07/02/2020. He believes he was on prednisone taper for hives at the time - Will proceed with salivary cortisol screening     F/U in 4 months     Signed electronically by: Mack Guise, MD  [facility]  [facility] [address] [place], [place] [address] Phone: [phone] FAX: [phone]   CC: Glendale Chard, [address] [place] [address] Phone: [phone]  Fax: [phone]  Return to Endocrinology clinic as below: Future Appointments  Date Time Provider [place]  02/21/2021 11:30 AM Glendale Chard, MD [facility] None  06/28/2021 10:00 AM Glendale Chard, MD [facility] None  10/26/2021  9:00 AM [facility] None  10/26/2021 10:00 AM Glendale Chard, MD [facility] None

## 2020-10-19 NOTE — Patient Instructions (Addendum)
-  Increase  Rybelsus 7 mg , 1 tablet with Breakfast  - Continue Glipizide 5 mg , 1 tablet before Breakfast       HOW TO TREAT LOW BLOOD SUGARS (Blood sugar LESS THAN 70 MG/DL)  Please follow the RULE OF 15 for the treatment of hypoglycemia treatment (when your (blood sugars are less than 70 mg/dL)    STEP 1: Take 15 grams of carbohydrates when your blood sugar is low, which includes:   3-4 GLUCOSE TABS  OR  3-4 OZ OF JUICE OR REGULAR SODA OR  ONE TUBE OF GLUCOSE GEL     STEP 2: RECHECK blood sugar in 15 MINUTES STEP 3: If your blood sugar is still low at the 15 minute recheck --> then, go back to STEP 1 and treat AGAIN with another 15 grams of carbohydrates.   SALIVARY CORTISOL COLLECTION INSTRUCTIONS    Precautions:  1. Please collect sample at [place] . You will need to do this on 2 nights  2. No food or fluids 30 minutes prior to collection.  3. Do not use any creams, lotions on hands, or use steroid inhalers 24- hours prior to collection.  4. Wash hands carefully.  5. Avoid any activity that could cause your gums to bleed: including flushing of brushing your teeth.  6. Kit must not be used in children less than 58 years of age, or a person that is at risk for choking on collection kit.  Instructions for saliva collection:   1. Rinse mouth thoroughly with water and discard. Do not swallow.  2. Hold the [place] at the rim of the suspended insert and remove the stopper.  3. Remove the swab.  4. Place swab under tongue until well saturated, approximately 1 minute.   5. Return the saturated swab to the suspended insert and close the Salivette firmly with the stopper.  6. Do not remove the tube holding the insert. The Salivette should be sent to the lab with the swab.   7. Come to the lab and leave the Salivette kit for labeling with your identifying information.   8. Make sure you refrigerate sample if not bringing to the lab immediately. Try to use cold packs for  transportation if available.

## 2020-10-20 LAB — CMP14+EGFR
ALT: 24 IU/L (ref 0–44)
AST: 23 IU/L (ref 0–40)
Albumin/Globulin Ratio: 1.5 (ref 1.2–2.2)
Albumin: 4.3 g/dL (ref 3.7–4.7)
Alkaline Phosphatase: 80 IU/L (ref 44–121)
BUN/Creatinine Ratio: 13 (ref 10–24)
BUN: 12 mg/dL (ref 8–27)
Bilirubin Total: 0.6 mg/dL (ref 0.0–1.2)
CO2: 23 mmol/L (ref 20–29)
Calcium: 9.7 mg/dL (ref 8.6–10.2)
Chloride: 101 mmol/L (ref 96–106)
Creatinine, Ser: 0.95 mg/dL (ref 0.76–1.27)
Globulin, Total: 2.8 g/dL (ref 1.5–4.5)
Glucose: 151 mg/dL — ABNORMAL HIGH (ref 65–99)
Potassium: 4.8 mmol/L (ref 3.5–5.2)
Sodium: 138 mmol/L (ref 134–144)
Total Protein: 7.1 g/dL (ref 6.0–8.5)
eGFR: 86 mL/min/{1.73_m2} (ref >59)

## 2020-10-20 LAB — TESTOSTERONE,FREE AND TOTAL
Testosterone, Free: 3.6 pg/mL — ABNORMAL LOW (ref 6.6–18.1)
Testosterone: 177 ng/dL — ABNORMAL LOW (ref 264–916)

## 2020-10-20 LAB — HEMOGLOBIN A1C
Est. average glucose Bld gHb Est-mCnc: 189 mg/dL
Hgb A1c MFr Bld: 8.2 % — ABNORMAL HIGH (ref 4.8–5.6)

## 2020-10-20 LAB — TSH: TSH: 3.44 u[IU]/mL (ref 0.450–4.500)

## 2020-10-21 ENCOUNTER — Other Ambulatory Visit: Payer: Medicare Other

## 2020-10-21 ENCOUNTER — Other Ambulatory Visit: Payer: Self-pay

## 2020-10-21 DIAGNOSIS — M25311 Other instability, right shoulder: Secondary | ICD-10-CM | POA: Diagnosis not present

## 2020-10-21 DIAGNOSIS — R7989 Other specified abnormal findings of blood chemistry: Secondary | ICD-10-CM

## 2020-10-30 LAB — SALIVARY CORTISOL X2, TIMED
Salivary Cortisol 2nd Specimen: 0.052 ug/dL
Salivary Cortisol Baseline: 0.12 ug/dL

## 2020-10-31 ENCOUNTER — Other Ambulatory Visit: Payer: Self-pay | Admitting: Internal Medicine

## 2020-10-31 ENCOUNTER — Telehealth: Payer: Self-pay | Admitting: Pharmacist

## 2020-10-31 DIAGNOSIS — R7989 Other specified abnormal findings of blood chemistry: Secondary | ICD-10-CM

## 2020-10-31 DIAGNOSIS — Z79899 Other long term (current) drug therapy: Secondary | ICD-10-CM

## 2020-10-31 NOTE — Progress Notes (Signed)
[facility])                                            [place] Team                                        Statin Quality Measure Assessment    10/31/2020  Connor Becker. [birth year] [id number]  Per review of chart and payor information, patient has a diagnosis of diabetes but is not currently filling a statin prescription.  This places patient into the SUPD (Statin Use In Patients with Diabetes) measure for CMS.    From chart review, Rosuvastatin was discontinued at discharge 12/21.  However, patient was recently seen, and it was documented that patient was on statin therapy.    Spoke with patient. HIPAA identifiers were obtained.  Patient confirmed he is taking Rosuvastatin 10 mg.  Per pharmacy and insurance records, Rosuvastatin has not been filled since 06/21.  In addition, Rosuvastatin is not on the patient's active medication list.  Patient said he would need a refill in a few weeks and would call it in at that time.   The 10-year ASCVD risk score [name] [name]., et al., 2013) is: 42.3%   Values used to calculate the score:     Age: 70 years     Sex: Male     Is Non-Hispanic African American: No     Diabetic: Yes     Tobacco smoker: No     Systolic Blood Pressure: 846 mmHg     Is BP treated: Yes     HDL Cholesterol: 35 mg/dL     Total Cholesterol: 196 mg/dL 01/06/2020     Component Value Date/Time   CHOL 196 01/06/2020 1602   TRIG 276 (H) 01/06/2020 1602   HDL 35 (L) 01/06/2020 1602   CHOLHDL 5.1 (H) 03/11/2019 1513   CHOLHDL 5 11/17/2015 1428   VLDL 60.0 (H) 11/17/2015 1428   LDLCALC 113 (H) 01/06/2020 1602   LDLDIRECT 148.0 11/17/2015 1428    If deemed therapeutically appropriate, Rosuvastatin may need be added to the patient's current medication list.  Once he fills Rosuvastatin for a 90 day supply, he will pass the CMS SUPD measure.  Elayne Guerin, PharmD, [facility] Clinical  Pharmacist [phone]

## 2020-11-02 DIAGNOSIS — M25311 Other instability, right shoulder: Secondary | ICD-10-CM | POA: Diagnosis not present

## 2020-11-02 DIAGNOSIS — M6281 Muscle weakness (generalized): Secondary | ICD-10-CM | POA: Diagnosis not present

## 2020-11-08 DIAGNOSIS — M6281 Muscle weakness (generalized): Secondary | ICD-10-CM | POA: Diagnosis not present

## 2020-11-08 DIAGNOSIS — M25311 Other instability, right shoulder: Secondary | ICD-10-CM | POA: Diagnosis not present

## 2020-11-09 ENCOUNTER — Other Ambulatory Visit: Payer: Self-pay | Admitting: Internal Medicine

## 2020-11-09 DIAGNOSIS — E039 Hypothyroidism, unspecified: Secondary | ICD-10-CM

## 2020-12-17 ENCOUNTER — Other Ambulatory Visit: Payer: Self-pay | Admitting: Internal Medicine

## 2020-12-17 DIAGNOSIS — E1165 Type 2 diabetes mellitus with hyperglycemia: Secondary | ICD-10-CM

## 2020-12-19 ENCOUNTER — Ambulatory Visit: Payer: Medicare Other | Admitting: Internal Medicine

## 2021-01-18 DIAGNOSIS — H47233 Glaucomatous optic atrophy, bilateral: Secondary | ICD-10-CM | POA: Diagnosis not present

## 2021-01-18 DIAGNOSIS — H401133 Primary open-angle glaucoma, bilateral, severe stage: Secondary | ICD-10-CM | POA: Diagnosis not present

## 2021-01-18 DIAGNOSIS — H353132 Nonexudative age-related macular degeneration, bilateral, intermediate dry stage: Secondary | ICD-10-CM | POA: Diagnosis not present

## 2021-02-06 ENCOUNTER — Other Ambulatory Visit: Payer: Self-pay | Admitting: Internal Medicine

## 2021-02-06 DIAGNOSIS — E039 Hypothyroidism, unspecified: Secondary | ICD-10-CM

## 2021-02-21 ENCOUNTER — Encounter: Payer: Self-pay | Admitting: Internal Medicine

## 2021-02-21 ENCOUNTER — Other Ambulatory Visit: Payer: Self-pay

## 2021-02-21 ENCOUNTER — Ambulatory Visit (INDEPENDENT_AMBULATORY_CARE_PROVIDER_SITE_OTHER): Payer: Medicare Other | Admitting: Internal Medicine

## 2021-02-21 VITALS — BP 112/80 | HR 78 | Temp 97.9°F | Ht 73.0 in | Wt 295.8 lb

## 2021-02-21 DIAGNOSIS — E1165 Type 2 diabetes mellitus with hyperglycemia: Secondary | ICD-10-CM

## 2021-02-21 DIAGNOSIS — E291 Testicular hypofunction: Secondary | ICD-10-CM

## 2021-02-21 DIAGNOSIS — I700 Atherosclerosis of aorta: Secondary | ICD-10-CM

## 2021-02-21 DIAGNOSIS — Z23 Encounter for immunization: Secondary | ICD-10-CM

## 2021-02-21 DIAGNOSIS — E039 Hypothyroidism, unspecified: Secondary | ICD-10-CM | POA: Diagnosis not present

## 2021-02-21 DIAGNOSIS — Z6839 Body mass index (BMI) 39.0-39.9, adult: Secondary | ICD-10-CM

## 2021-02-21 MED ORDER — TETANUS-DIPHTH-ACELL PERTUSSIS 5-2.5-18.5 LF-MCG/0.5 IM SUSP: 0.5000 mL | Freq: Once | INTRAMUSCULAR | 0 refills | Status: AC

## 2021-02-21 NOTE — Progress Notes (Signed)
I,Tianna Badgett,acting as a Education administrator for Maximino Greenland, MD.,have documented all relevant documentation on the behalf of Maximino Greenland, MD,as directed by  Maximino Greenland, MD while in the presence of Maximino Greenland, MD.  This visit occurred during the SARS-CoV-2 public health emergency.  Safety protocols were in place, including screening questions prior to the visit, additional usage of staff PPE, and extensive cleaning of exam room while observing appropriate contact time as indicated for disinfecting solutions.  Subjective:     Patient ID: Connor Becker , male    DOB: 16-May-1951 , 70 y.o.   MRN: [id number]   Chief Complaint  Patient presents with   Diabetes   Thyroid Problem    HPI  Patient presents today for thyroid f/u. He reports compliance with meds. He admits he is not exercising as much as he should. He is now followed by Endocrinology for his diabetes. He has appt next week. Would like to do labs today.   Diabetes He presents for his follow-up diabetic visit. He has type 2 diabetes mellitus. There are no hypoglycemic associated symptoms. Associated symptoms include fatigue. Pertinent negatives for diabetes include no visual change. There are no hypoglycemic complications. There are no diabetic complications. Risk factors for coronary artery disease include diabetes mellitus, dyslipidemia, male sex, obesity and sedentary lifestyle. He participates in exercise intermittently. An ACE inhibitor/angiotensin II receptor blocker is being taken. Eye exam is not current.  Thyroid Problem Presents for follow-up visit. Symptoms include fatigue. Patient reports no cold intolerance, leg swelling or visual change. The symptoms have been stable.    Past Medical History:  Diagnosis Date   ALLERGIC RHINITIS    Chronic pain of both knees    DJD (degenerative joint disease) of knee    right   DM2 (diabetes mellitus, type 2) ([facility])    GERD    Glaucoma    History of nuclear stress  test    Myoview 12/17: EF 53, no ischemia, Low Risk   HYPERLIPIDEMIA    myalgias from Lipitor   HYPOGONADISM, MALE    Hypothyroidism    INSOMNIA-SLEEP DISORDER-UNSPEC    Joint pain    Obesity    OSTEOARTHRITIS    SLEEP APNEA, OBSTRUCTIVE    no CPAP; repeat sleep test pending 07/2016   Unspecified hypothyroidism 12/04/2013   Vitamin D deficiency      Family History  Problem Relation Age of Onset   Diabetes Maternal Uncle    Heart attack Maternal Uncle    Hypertension Other    Prostate cancer Other    Heart disease Other    Heart attack Mother 25       s/p MI   Other Mother        pacemaker   Diabetes Mother    High blood pressure Mother    High Cholesterol Mother    Thyroid disease Mother    Obesity Mother    Macular degeneration Father    Thyroid disease Father    Colon cancer Neg Hx      Current Outpatient Medications:    Continuous Blood Gluc Receiver (FREESTYLE LIBRE 2 READER) DEVI, 1 Device by Does not apply route as directed., Disp: 1 each, Rfl: 0   Continuous Blood Gluc Sensor (FREESTYLE LIBRE 2 SENSOR) MISC, 1 Device by Does not apply route in the morning, at noon, and at bedtime., Disp: 2 each, Rfl: 6   dicyclomine (BENTYL) 20 MG tablet, Take 1 tablet (20 mg total) by  mouth 2 (two) times daily., Disp: 20 tablet, Rfl: 0   EPINEPHrine 0.3 mg/0.3 mL IJ SOAJ injection, Inject 0.3 mg into the muscle as needed for anaphylaxis., Disp: 1 each, Rfl: 0   glipiZIDE (GLUCOTROL) 5 MG tablet, Take 1 tablet (5 mg total) by mouth daily before breakfast., Disp: 30 tablet, Rfl: 3   HYDROcodone-acetaminophen (NORCO/VICODIN) 5-325 MG tablet, Take 2 tablets by mouth every 4 (four) hours as needed., Disp: 6 tablet, Rfl: 0   latanoprost (XALATAN) 0.005 % ophthalmic solution, Place 1 drop into both eyes daily., Disp: , Rfl:    levothyroxine (SYNTHROID) 112 MCG tablet, Take 1 tablet by mouth once daily, Disp: 90 tablet, Rfl: 0   loperamide (IMODIUM) 2 MG capsule, Take 1 capsule (2 mg  total) by mouth 4 (four) times daily as needed for diarrhea or loose stools., Disp: 12 capsule, Rfl: 0   Magnesium 400 MG CAPS, Take 400 mg by mouth at bedtime., Disp: 90 capsule, Rfl: 1   methocarbamol (ROBAXIN) 750 MG tablet, Take 750 mg by mouth 4 (four) times daily as needed for muscle spasms., Disp: , Rfl:    Multiple Vitamins-Minerals (MULTIVITAMIN/EXTRA VITAMIN D3) CHEW, Chew 1 tablet by mouth daily., Disp: , Rfl:    omeprazole (PRILOSEC) 20 MG capsule, Take 20 mg by mouth daily as needed (acid reflux)., Disp: , Rfl:    ONETOUCH VERIO test strip, USE STRIP TO CHECK BLOOD SUGAR UP TO 4 TIMES PER DAY, Disp: 200 each, Rfl: 11   predniSONE (DELTASONE) 5 MG tablet, Take 20 mg on day 1, 10 mg on day 2 and 5 mg on day 3., Disp: 7 tablet, Rfl: 0   promethazine (PHENERGAN) 12.5 MG tablet, Take 1 tablet (12.5 mg total) by mouth every 6 (six) hours as needed for nausea or vomiting., Disp: 6 tablet, Rfl: 0   pseudoephedrine (SUDAFED) 30 MG tablet, Take 60 mg by mouth 2 (two) times daily as needed for congestion., Disp: , Rfl:    Semaglutide (RYBELSUS) 7 MG TABS, Take 7 mg by mouth daily before breakfast., Disp: 30 tablet, Rfl: 6   tadalafil (CIALIS) 5 MG tablet, Take 5 mg by mouth daily., Disp: , Rfl: 11   testosterone enanthate (DELATESTRYL) 200 MG/ML injection, Inject 200 mg into the muscle every 14 (fourteen) days., Disp: , Rfl:    timolol (TIMOPTIC) 0.5 % ophthalmic solution, Place 1 drop into both eyes daily., Disp: , Rfl: 2   Turmeric 500 MG CAPS, Take 500 mg by mouth daily., Disp: , Rfl:    vitamin C (ASCORBIC ACID) 500 MG tablet, Take 1,000 mg by mouth daily., Disp: , Rfl:    Vitamin D, Ergocalciferol, (DRISDOL) 1.25 MG (50000 UNIT) CAPS capsule, Take 1 capsule (50,000 Units total) by mouth See admin instructions. Take one capsule by mouth on Tues/Fridays, Disp: 24 capsule, Rfl: 0   Allergies  Allergen Reactions   Horseradish [Armoracia Rusticana Ext (Horseradish)]    Lipitor [Atorvastatin]  Other (See Comments)    Leg cramps   Metformin And Related Other (See Comments)    GI upset   Soy Allergy Other (See Comments)    Pt stated soy sauce causes angioedema, but has added some soy products back in to diet and no angioedema.      Review of Systems  Constitutional:  Positive for fatigue.       He has not been taking his testosterone supplements. Wants to get re-established with Urology  Respiratory: Negative.    Cardiovascular: Negative.   Endocrine:  Negative for cold intolerance.  Genitourinary:        Has been on testosterone replacement therapy in the past. Has not had any injections in some time. Would like referral back to Urology.  Neurological: Negative.     Today's Vitals   02/21/21 1132  BP: 112/80  Pulse: 78  Temp: 97.9 F (36.6 C)  TempSrc: Oral  Weight: 295 lb 12.8 oz (134.2 kg)  Height: $Remove'6\' 1"'[id number]$  (1.854 m)  PainSc: 0-No pain   Body mass index is 39.03 kg/m.  Wt Readings from Last 3 Encounters:  02/21/21 295 lb 12.8 oz (134.2 kg)  10/19/20 300 lb (136.1 kg)  10/19/20 299 lb (135.6 kg)    BP Readings from Last 3 Encounters:  02/21/21 112/80  10/19/20 126/82  10/19/20 122/80    Objective:  Physical Exam Vitals and nursing note reviewed.  Constitutional:      Appearance: Normal appearance. He is obese.  HENT:     Head: Normocephalic and atraumatic.     Nose:     Comments: Masked     Mouth/Throat:     Comments: Masked  Eyes:     Extraocular Movements: Extraocular movements intact.  Cardiovascular:     Rate and Rhythm: Normal rate and regular rhythm.     Heart sounds: Normal heart sounds.  Pulmonary:     Effort: Pulmonary effort is normal.     Breath sounds: Normal breath sounds.  Musculoskeletal:     Cervical back: Normal range of motion.  Skin:    General: Skin is warm.  Neurological:     General: No focal deficit present.     Mental Status: He is alert.  Psychiatric:        Mood and Affect: Mood normal.        Assessment And  Plan:     1. Type 2 diabetes mellitus with hyperglycemia, without long-term current use of insulin ([place]) Comments: I will check labs as listed below. Pt advised that his provider will be able to review results at his next visit. He will f/u in Dec 2022 for physical exam.  - BMP8+EGFR - Hemoglobin A1c  2. Primary hypothyroidism Comments: I will check thyroid panel and adjust meds as needed.  - TSH - T4, Free  3. Hypogonadism in male Comments: I will refer him to Urology as requested. Last testosterone level drawn in April 2022 was suboptimal.  - Ambulatory referral to Urology  4. Atherosclerosis of aorta ([place]) Comments: He is encouraged to follow a heart healthy lifestyle and incorporate more exercise into his daily routine.   5. Class 2 severe obesity due to excess calories with serious comorbidity and body mass index (BMI) of 39.0 to 39.9 in adult [facility]) Comments:  He is encouraged to initially strive for BMI less than 30 to decrease cardiac risk. He is advised to exercise no less than 150 minutes per week.    6. Immunization due Comments: I will send rx Tdap(Boostrix) to his local pharmacy.   Patient was given opportunity to ask questions. Patient verbalized understanding of the plan and was able to repeat key elements of the plan. All questions were answered to their satisfaction.   I, Maximino Greenland, MD, have reviewed all documentation for this visit. The documentation on 02/22/21 for the exam, diagnosis, procedures, and orders are all accurate and complete.   IF YOU HAVE BEEN REFERRED TO A SPECIALIST, IT MAY TAKE 1-2 WEEKS TO SCHEDULE/PROCESS THE REFERRAL. IF YOU HAVE NOT HEARD FROM  US/SPECIALIST IN TWO WEEKS, PLEASE GIVE [place] A CALL AT [phone] X [phone].   THE PATIENT IS ENCOURAGED TO PRACTICE SOCIAL DISTANCING DUE TO THE COVID-19 PANDEMIC.

## 2021-02-21 NOTE — Patient Instructions (Signed)
Diabetes Mellitus and Nutrition, Adult When you have diabetes, or diabetes mellitus, it is very important to have healthy eating habits because your blood sugar (glucose) levels are greatly affected by what you eat and drink. Eating healthy foods in the right amounts, at about the same times every day, can help you:  Control your blood glucose.  Lower your risk of heart disease.  Improve your blood pressure.  Reach or maintain a healthy weight. What can affect my meal plan? Every person with diabetes is different, and each person has different needs for a meal plan. Your health care provider may recommend that you work with a dietitian to make a meal plan that is best for you. Your meal plan may vary depending on factors such as:  The calories you need.  The medicines you take.  Your weight.  Your blood glucose, blood pressure, and cholesterol levels.  Your activity level.  Other health conditions you have, such as heart or kidney disease. How do carbohydrates affect me? Carbohydrates, also called carbs, affect your blood glucose level more than any other type of food. Eating carbs naturally raises the amount of glucose in your blood. Carb counting is a method for keeping track of how many carbs you eat. Counting carbs is important to keep your blood glucose at a healthy level, especially if you use insulin or take certain oral diabetes medicines. It is important to know how many carbs you can safely have in each meal. This is different for every person. Your dietitian can help you calculate how many carbs you should have at each meal and for each snack. How does alcohol affect me? Alcohol can cause a sudden decrease in blood glucose (hypoglycemia), especially if you use insulin or take certain oral diabetes medicines. Hypoglycemia can be a life-threatening condition. Symptoms of hypoglycemia, such as sleepiness, dizziness, and confusion, are similar to symptoms of having too much  alcohol.  Do not drink alcohol if: ? Your health care provider tells you not to drink. ? You are pregnant, may be pregnant, or are planning to become pregnant.  If you drink alcohol: ? Do not drink on an empty stomach. ? Limit how much you use to:  0-1 drink a day for women.  0-2 drinks a day for men. ? Be aware of how much alcohol is in your drink. In the [place] one drink equals one 12 oz bottle of beer (355 mL), one 5 oz glass of wine (148 mL), or one 1 oz glass of hard liquor (44 mL). ? Keep yourself hydrated with water, diet soda, or unsweetened iced tea.  Keep in mind that regular soda, juice, and other mixers may contain a lot of sugar and must be counted as carbs. What are tips for following this plan? Reading food labels  Start by checking the serving size on the "Nutrition Facts" label of packaged foods and drinks. The amount of calories, carbs, fats, and other nutrients listed on the label is based on one serving of the item. Many items contain more than one serving per package.  Check the total grams (g) of carbs in one serving. You can calculate the number of servings of carbs in one serving by dividing the total carbs by 15. For example, if a food has 30 g of total carbs per serving, it would be equal to 2 servings of carbs.  Check the number of grams (g) of saturated fats and trans fats in one serving. Choose foods that have   a low amount or none of these fats.  Check the number of milligrams (mg) of salt (sodium) in one serving. Most people should limit total sodium intake to less than 2,300 mg per day.  Always check the nutrition information of foods labeled as "low-fat" or "nonfat." These foods may be higher in added sugar or refined carbs and should be avoided.  Talk to your dietitian to identify your daily goals for nutrients listed on the label. Shopping  Avoid buying canned, pre-made, or processed foods. These foods tend to be high in fat, sodium, and added  sugar.  Shop around the outside edge of the grocery store. This is where you will most often find fresh fruits and vegetables, bulk grains, fresh meats, and fresh dairy. Cooking  Use low-heat cooking methods, such as baking, instead of high-heat cooking methods like deep frying.  Cook using healthy oils, such as olive, canola, or sunflower oil.  Avoid cooking with butter, cream, or high-fat meats. Meal planning  Eat meals and snacks regularly, preferably at the same times every day. Avoid going long periods of time without eating.  Eat foods that are high in fiber, such as fresh fruits, vegetables, beans, and whole grains. Talk with your dietitian about how many servings of carbs you can eat at each meal.  Eat 4-6 oz (112-168 g) of lean protein each day, such as lean meat, chicken, fish, eggs, or tofu. One ounce (oz) of lean protein is equal to: ? 1 oz (28 g) of meat, chicken, or fish. ? 1 egg. ?  cup (62 g) of tofu.  Eat some foods each day that contain healthy fats, such as avocado, nuts, seeds, and fish.   What foods should I eat? Fruits Berries. Apples. Oranges. Peaches. Apricots. Plums. Grapes. Mango. Papaya. Pomegranate. Kiwi. Cherries. Vegetables Lettuce. Spinach. Leafy greens, including kale, chard, collard greens, and mustard greens. Beets. Cauliflower. Cabbage. Broccoli. Carrots. Green beans. Tomatoes. Peppers. Onions. Cucumbers. Brussels sprouts. Grains Whole grains, such as whole-wheat or whole-grain bread, crackers, tortillas, cereal, and pasta. Unsweetened oatmeal. Quinoa. Brown or wild rice. Meats and other proteins Seafood. Poultry without skin. Lean cuts of poultry and beef. Tofu. Nuts. Seeds. Dairy Low-fat or fat-free dairy products such as milk, yogurt, and cheese. The items listed above may not be a complete list of foods and beverages you can eat. Contact a dietitian for more information. What foods should I avoid? Fruits Fruits canned with  syrup. Vegetables Canned vegetables. Frozen vegetables with butter or cream sauce. Grains Refined white flour and flour products such as bread, pasta, snack foods, and cereals. Avoid all processed foods. Meats and other proteins Fatty cuts of meat. Poultry with skin. Breaded or fried meats. Processed meat. Avoid saturated fats. Dairy Full-fat yogurt, cheese, or milk. Beverages Sweetened drinks, such as soda or iced tea. The items listed above may not be a complete list of foods and beverages you should avoid. Contact a dietitian for more information. Questions to ask a health care provider  Do I need to meet with a diabetes educator?  Do I need to meet with a dietitian?  What number can I call if I have questions?  When are the best times to check my blood glucose? Where to find more information:  American Diabetes Association: diabetes.org  Academy of Nutrition and Dietetics: www.eatright.org  National Institute of Diabetes and Digestive and Kidney Diseases: www.niddk.nih.gov  Association of Diabetes Care and Education Specialists: www.diabeteseducator.org Summary  It is important to have healthy eating   habits because your blood sugar (glucose) levels are greatly affected by what you eat and drink.  A healthy meal plan will help you control your blood glucose and maintain a healthy lifestyle.  Your health care provider may recommend that you work with a dietitian to make a meal plan that is best for you.  Keep in mind that carbohydrates (carbs) and alcohol have immediate effects on your blood glucose levels. It is important to count carbs and to use alcohol carefully. This information is not intended to replace advice given to you by your health care provider. Make sure you discuss any questions you have with your health care provider. Document Revised: 06/09/2019 Document Reviewed: 06/09/2019 Elsevier Patient Education  2021 Elsevier Inc.  

## 2021-02-22 LAB — BMP8+EGFR
BUN/Creatinine Ratio: 17 (ref 10–24)
BUN: 16 mg/dL (ref 8–27)
CO2: 25 mmol/L (ref 20–29)
Calcium: 9.4 mg/dL (ref 8.6–10.2)
Chloride: 100 mmol/L (ref 96–106)
Creatinine, Ser: 0.94 mg/dL (ref 0.76–1.27)
Glucose: 178 mg/dL — ABNORMAL HIGH (ref 65–99)
Potassium: 4.8 mmol/L (ref 3.5–5.2)
Sodium: 138 mmol/L (ref 134–144)
eGFR: 86 mL/min/{1.73_m2} (ref >59)

## 2021-02-22 LAB — TSH: TSH: 3.26 u[IU]/mL (ref 0.450–4.500)

## 2021-02-22 LAB — T4, FREE: Free T4: 1.52 ng/dL (ref 0.82–1.77)

## 2021-02-22 LAB — HEMOGLOBIN A1C
Est. average glucose Bld gHb Est-mCnc: 171 mg/dL
Hgb A1c MFr Bld: 7.6 % — ABNORMAL HIGH (ref 4.8–5.6)

## 2021-03-01 ENCOUNTER — Ambulatory Visit (INDEPENDENT_AMBULATORY_CARE_PROVIDER_SITE_OTHER): Payer: Medicare Other | Admitting: Internal Medicine

## 2021-03-01 ENCOUNTER — Other Ambulatory Visit: Payer: Self-pay

## 2021-03-01 ENCOUNTER — Encounter: Payer: Self-pay | Admitting: Internal Medicine

## 2021-03-01 VITALS — BP 134/80 | HR 81 | Ht 75.0 in | Wt 297.2 lb

## 2021-03-01 DIAGNOSIS — E785 Hyperlipidemia, unspecified: Secondary | ICD-10-CM | POA: Diagnosis not present

## 2021-03-01 DIAGNOSIS — R7989 Other specified abnormal findings of blood chemistry: Secondary | ICD-10-CM | POA: Diagnosis not present

## 2021-03-01 DIAGNOSIS — E1165 Type 2 diabetes mellitus with hyperglycemia: Secondary | ICD-10-CM | POA: Diagnosis not present

## 2021-03-01 LAB — POCT GLYCOSYLATED HEMOGLOBIN (HGB A1C): Hemoglobin A1C: 0 % — AB (ref 4.0–5.6)

## 2021-03-01 MED ORDER — ROSUVASTATIN CALCIUM 20 MG PO TABS: 20.0000 mg | ORAL_TABLET | Freq: Every day | ORAL | 3 refills | Status: DC

## 2021-03-01 MED ORDER — GLIPIZIDE 5 MG PO TABS: 5.0000 mg | ORAL_TABLET | Freq: Every day | ORAL | 3 refills | Status: DC

## 2021-03-01 MED ORDER — OZEMPIC (1 MG/DOSE) 4 MG/3ML ~~LOC~~ SOPN: 1.0000 mg | PEN_INJECTOR | SUBCUTANEOUS | 3 refills | Status: DC

## 2021-03-01 NOTE — Progress Notes (Signed)
`    Name: Connor Becker.  Age/ Sex: 70 y.o., male   MRN/ DOB: [id number], 1951/05/31     PCP: Glendale Chard, MD   Reason for Endocrinology Evaluation: Type 2 Diabetes Mellitus  Initial Endocrine Consultative Visit: 04/13/2020    PATIENT IDENTIFIER: Connor Becker. is a 70 y.o. male with a past medical history of T2DM,OSA, hypogonadism and dyslipidemia. The patient has followed with Endocrinology clinic since 04/13/2020 for consultative assistance with management of his diabetes.  DIABETIC HISTORY:  Connor Becker was diagnosed with DM2 in 2014,Metformin- GI side effect, Farxiga -severe urinary frequency preventing him from sleeping at night.  His hemoglobin A1c has ranged from 6.6%  in 2020, peaking at 7.7% in 2021.  On his initial visit to our clinic his A1c was ,  He was not on any glycemic medications. Started Rybelsus  SUBJECTIVE:   During the last visit (08/15/2020): A1c 7.6 % Continued Rybelsus and started Glipizide   Today (03/01/2021): Connor Becker is here for a follow up on diabetes.  He checks his blood sugars multiple times a day through CGM. The patient has not had hypoglycemic episodes since the last clinic visit.   He has been having imperfect adherence to medication in the past week . He has been helping a family member build a deck and sometimes he does not eat breakfast until later which results in not taking Glipizide properly.     HOME DIABETES REGIMEN:  Rybelsus 7 mg daily  Glipizide 5 mg daily      Statin: yes ACE-I/ARB: yes   CONTINUOUS GLUCOSE MONITORING RECORD INTERPRETATION    Dates of Recording:   Sensor description:7/21-8/17/2022  Results statistics:   CGM use % of time 86  Average and SD 167/23.5  Time in range    67  %  % Time Above 180 30  % Time above 250 3  % Time Below target 0      Glycemic patterns summary: hyperglycemia during the day,especially after supper ., BG's trends down at night   Hyperglycemic  episodes  Post prandial   Hypoglycemic episodes occurred N.A  Overnight periods: trends down     DIABETIC COMPLICATIONS: Microvascular complications:   Denies: CKD, retinopathy, neuropathy Last Eye Exam: Completed 02/2021  Macrovascular complications:   Denies: CAD, CVA, PVD   HISTORY:  Past Medical History:  Past Medical History:  Diagnosis Date   ALLERGIC RHINITIS    Chronic pain of both knees    DJD (degenerative joint disease) of knee    right   DM2 (diabetes mellitus, type 2) ([place])    GERD    Glaucoma    History of nuclear stress test    Myoview 12/17: EF 53, no ischemia, Low Risk   HYPERLIPIDEMIA    myalgias from Lipitor   HYPOGONADISM, MALE    Hypothyroidism    INSOMNIA-SLEEP DISORDER-UNSPEC    Joint pain    Obesity    OSTEOARTHRITIS    SLEEP APNEA, OBSTRUCTIVE    no CPAP; repeat sleep test pending 07/2016   Unspecified hypothyroidism 12/04/2013   Vitamin D deficiency    Past Surgical History:  Past Surgical History:  Procedure Laterality Date   NASAL SEPTUM SURGERY     TONSILLECTOMY     UVULOPALATOPHARYNGOPLASTY     VASECTOMY     Social History:  reports that he has never smoked. He has never used smokeless tobacco. He reports that he does not currently use alcohol. He reports that  he does not use drugs. Family History:  Family History  Problem Relation Age of Onset   Diabetes Maternal Uncle    Heart attack Maternal Uncle    Hypertension Other    Prostate cancer Other    Heart disease Other    Heart attack Mother 93       s/p MI   Other Mother        pacemaker   Diabetes Mother    High blood pressure Mother    High Cholesterol Mother    Thyroid disease Mother    Obesity Mother    Macular degeneration Father    Thyroid disease Father    Colon cancer Neg Hx      HOME MEDICATIONS: Allergies as of 03/01/2021       Reactions   Horseradish [armoracia Rusticana Ext (horseradish)]    Lipitor [atorvastatin] Other (See Comments)   Leg  cramps   Metformin And Related Other (See Comments)   GI upset   Soy Allergy Other (See Comments)   Pt stated soy sauce causes angioedema, but has added some soy products back in to diet and no angioedema.         Medication List        Accurate as of March 01, 2021  9:01 AM. If you have any questions, ask your nurse or doctor.          dicyclomine 20 MG tablet Commonly known as: BENTYL Take 1 tablet (20 mg total) by mouth 2 (two) times daily.   EPINEPHrine 0.3 mg/0.3 mL Soaj injection Commonly known as: EPI-PEN Inject 0.3 mg into the muscle as needed for anaphylaxis.   FreeStyle Libre 2 Reader Devi 1 Device by Does not apply route as directed.   FreeStyle Libre 2 Sensor Misc 1 Device by Does not apply route in the morning, at noon, and at bedtime.   glipiZIDE 5 MG tablet Commonly known as: GLUCOTROL Take 1 tablet (5 mg total) by mouth daily before breakfast.   HYDROcodone-acetaminophen 5-325 MG tablet Commonly known as: NORCO/VICODIN Take 2 tablets by mouth every 4 (four) hours as needed.   latanoprost 0.005 % ophthalmic solution Commonly known as: XALATAN Place 1 drop into both eyes daily.   levothyroxine 112 MCG tablet Commonly known as: SYNTHROID Take 1 tablet by mouth once daily   loperamide 2 MG capsule Commonly known as: IMODIUM Take 1 capsule (2 mg total) by mouth 4 (four) times daily as needed for diarrhea or loose stools.   Magnesium 400 MG Caps Take 400 mg by mouth at bedtime.   methocarbamol 750 MG tablet Commonly known as: ROBAXIN Take 750 mg by mouth 4 (four) times daily as needed for muscle spasms.   Multivitamin/Extra Vitamin D3 Chew Chew 1 tablet by mouth daily.   omeprazole 20 MG capsule Commonly known as: PRILOSEC Take 20 mg by mouth daily as needed (acid reflux).   OneTouch Verio test strip Generic drug: glucose blood USE STRIP TO CHECK BLOOD SUGAR UP TO 4 TIMES PER DAY   predniSONE 5 MG tablet Commonly known as:  DELTASONE Take 20 mg on day 1, 10 mg on day 2 and 5 mg on day 3.   promethazine 12.5 MG tablet Commonly known as: PHENERGAN Take 1 tablet (12.5 mg total) by mouth every 6 (six) hours as needed for nausea or vomiting.   pseudoephedrine 30 MG tablet Commonly known as: SUDAFED Take 60 mg by mouth 2 (two) times daily as needed for congestion.  Rybelsus 7 MG Tabs Generic drug: Semaglutide Take 7 mg by mouth daily before breakfast.   tadalafil 5 MG tablet Commonly known as: CIALIS Take 5 mg by mouth daily.   testosterone enanthate 200 MG/ML injection Commonly known as: DELATESTRYL Inject 200 mg into the muscle every 14 (fourteen) days.   timolol 0.5 % ophthalmic solution Commonly known as: TIMOPTIC Place 1 drop into both eyes daily.   Turmeric 500 MG Caps Take 500 mg by mouth daily.   vitamin C 500 MG tablet Commonly known as: ASCORBIC ACID Take 1,000 mg by mouth daily.   Vitamin D (Ergocalciferol) 1.25 MG (50000 UNIT) Caps capsule Commonly known as: DRISDOL Take 1 capsule (50,000 Units total) by mouth See admin instructions. Take one capsule by mouth on Tues/Fridays         OBJECTIVE:   Vital Signs: BP 134/80   Pulse 81   Ht '6\' 3"'$  (1.905 m)   Wt 297 lb 3.2 oz (134.8 kg)   SpO2 98%   BMI 37.15 kg/m   Wt Readings from Last 3 Encounters:  02/21/21 295 lb 12.8 oz (134.2 kg)  10/19/20 300 lb (136.1 kg)  10/19/20 299 lb (135.6 kg)     Exam: General: Pt appears well and is in NAD  Lungs: Clear with good BS bilat with no rales, rhonchi, or wheezes  Heart: RRR with normal S1 and S2 and no gallops; no murmurs; no rub  Extremities: Trace pretibial edema.   Neuro: MS is good with appropriate affect, pt is alert and Ox3   DM foot exam: 03/01/2021   The skin of the feet is intact without sores or ulcerations. The pedal pulses are 1+ on right and 1+ on left. The sensation is intact to a screening 5.07, 10 gram monofilament bilaterally     DATA REVIEWED:  Lab  Results  Component Value Date   HGBA1C 7.6 (H) 02/21/2021   HGBA1C 8.2 (H) 10/19/2020   HGBA1C 7.6 (H) 06/20/2020   Lab Results  Component Value Date   MICROALBUR 80 06/20/2020   LDLCALC 113 (H) 01/06/2020   CREATININE 0.94 02/21/2021   Lab Results  Component Value Date   MICRALBCREAT <30 06/20/2020     Lab Results  Component Value Date   CHOL 196 01/06/2020   HDL 35 (L) 01/06/2020   LDLCALC 113 (H) 01/06/2020   LDLDIRECT 148.0 11/17/2015   TRIG 276 (H) 01/06/2020   CHOLHDL 5.1 (H) 03/11/2019         ASSESSMENT / PLAN / RECOMMENDATIONS:   1) Type 2 Diabetes Mellitus, Sub-Optimally controlled, Without complications - Most recent A1c of 7.6% Goal A1c < 7.0 %.    -He is intolerant to metformin, he was on Farxiga but this caused frequency preventing him from sleeping at night, he is not too keen on restarting. Will increase Rybelsus as below, he was advised to take it before breakfast 9 has been taking  It at night ) His wife is on Ozempic and he would like to switch Rybelsus to Ozempic as well     MEDICATIONS:   -Switch Rybelsus to Ozempic 1 mg weekly  - Continue Glipizide 5 mg , 1 tablet before Breakfast     EDUCATION / INSTRUCTIONS: BG monitoring instructions: Patient is instructed to check his blood sugars 2 times a day, fasting and bedtime . Call [facility] [facility] clinic if: BG persistently < 70  I reviewed the Rule of 15 for the treatment of hypoglycemia in detail with the patient. Literature  supplied.    2) Diabetic complications:  Eye: Does not have known diabetic retinopathy.  Neuro/ Feet: Does not have known diabetic peripheral neuropathy .  Renal: Patient does not have known baseline CKD.      3) Elevated Serum cortisol : - Serum cortisol elevated at 24.9 ug/dL on 07/02/2020. He believes he was on prednisone taper for hives at the time - One of the salivary cortisol samples was abnormal, pt to proceed with 24- hr urinary cortisol   4)  Dyslipidemia :    - He admits to medication non adherence, discussed cardiovascular benefits of statins    Medication Restart Rosuvastatin 20 mg daily   F/U in 4 months     Signed electronically by: Mack Guise, MD  [facility] Endocrinology  [facility] [address] [place], [place] [address] Phone: [phone] FAX: [phone]   CC: Glendale Chard, [address] [place] [address] Phone: [phone]  Fax: [phone]  Return to Endocrinology clinic as below: Future Appointments  Date Time Provider [place]  03/01/2021  1:00 PM Januel Doolan, Melanie Crazier, MD [facility] None  06/28/2021 10:00 AM Glendale Chard, MD [facility] None  10/26/2021  9:00 AM [facility] None  10/26/2021 10:00 AM Glendale Chard, MD [facility] None

## 2021-03-01 NOTE — Patient Instructions (Signed)
-   Switch Rybelsus to Ozempic 1 mg weekly  - Continue Glipizide 5 mg , 1 tablet before Breakfast  - Please restart Crestor 20 mg daily    HOW TO TREAT LOW BLOOD SUGARS (Blood sugar LESS THAN 70 MG/DL) Please follow the RULE OF 15 for the treatment of hypoglycemia treatment (when your (blood sugars are less than 70 mg/dL)   STEP 1: Take 15 grams of carbohydrates when your blood sugar is low, which includes:  3-4 GLUCOSE TABS  OR 3-4 OZ OF JUICE OR REGULAR SODA OR ONE TUBE OF GLUCOSE GEL    STEP 2: RECHECK blood sugar in 15 MINUTES STEP 3: If your blood sugar is still low at the 15 minute recheck --> then, go back to STEP 1 and treat AGAIN with another 15 grams of carbohydrates.   24-Hour Urine Collection  You will be collecting your urine for a 24-hour period of time. Your timer starts with your first urine of the morning (For example - If you first pee at [place], your timer will start at [place]) Reader away your first urine of the morning Collect your urine every time you pee for the next 24 hours STOP your urine collection 24 hours after you started the collection (For example - You would stop at 9AM the day after you started)

## 2021-03-02 ENCOUNTER — Encounter: Payer: Self-pay | Admitting: Internal Medicine

## 2021-03-16 ENCOUNTER — Ambulatory Visit
Admission: EM | Admit: 2021-03-16 | Discharge: 2021-03-16 | Disposition: A | Discharge status: Other Acute Inpatient Hospital | Payer: Medicare Other

## 2021-03-16 ENCOUNTER — Emergency Department (HOSPITAL_COMMUNITY)
Admission: EM | Admit: 2021-03-16 | Discharge: 2021-03-16 | Disposition: A | Discharge status: Home / Self Care | Payer: Medicare Other | Attending: Emergency Medicine | Admitting: Emergency Medicine

## 2021-03-16 ENCOUNTER — Encounter: Payer: Self-pay | Admitting: Emergency Medicine

## 2021-03-16 ENCOUNTER — Encounter (HOSPITAL_COMMUNITY): Payer: Self-pay | Admitting: Emergency Medicine

## 2021-03-16 ENCOUNTER — Other Ambulatory Visit: Payer: Self-pay

## 2021-03-16 DIAGNOSIS — R9431 Abnormal electrocardiogram [ECG] [EKG]: Secondary | ICD-10-CM | POA: Diagnosis not present

## 2021-03-16 DIAGNOSIS — Z5321 Procedure and treatment not carried out due to patient leaving prior to being seen by health care provider: Secondary | ICD-10-CM | POA: Insufficient documentation

## 2021-03-16 DIAGNOSIS — E860 Dehydration: Secondary | ICD-10-CM | POA: Diagnosis not present

## 2021-03-16 DIAGNOSIS — R112 Nausea with vomiting, unspecified: Secondary | ICD-10-CM

## 2021-03-16 DIAGNOSIS — Z8639 Personal history of other endocrine, nutritional and metabolic disease: Secondary | ICD-10-CM

## 2021-03-16 LAB — COMPREHENSIVE METABOLIC PANEL
ALT: 23 U/L (ref 0–44)
AST: 21 U/L (ref 15–41)
Albumin: 4.2 g/dL (ref 3.5–5.0)
Alkaline Phosphatase: 63 U/L (ref 38–126)
Anion gap: 10 (ref 5–15)
BUN: 21 mg/dL (ref 8–23)
CO2: 27 mmol/L (ref 22–32)
Calcium: 8.8 mg/dL — ABNORMAL LOW (ref 8.9–10.3)
Chloride: 101 mmol/L (ref 98–111)
Creatinine, Ser: 1.18 mg/dL (ref 0.61–1.24)
GFR, Estimated: >60 mL/min (ref >60)
Glucose, Bld: 125 mg/dL — ABNORMAL HIGH (ref 70–99)
Potassium: 3.9 mmol/L (ref 3.5–5.1)
Sodium: 138 mmol/L (ref 135–145)
Total Bilirubin: 1 mg/dL (ref 0.3–1.2)
Total Protein: 7.8 g/dL (ref 6.5–8.1)

## 2021-03-16 LAB — CBC
HCT: 48.1 % (ref 39.0–52.0)
Hemoglobin: 16.1 g/dL (ref 13.0–17.0)
MCH: 31.1 pg (ref 26.0–34.0)
MCHC: 33.5 g/dL (ref 30.0–36.0)
MCV: 93 fL (ref 80.0–100.0)
Platelets: 277 10*3/uL (ref 150–400)
RBC: 5.17 MIL/uL (ref 4.22–5.81)
RDW: 13.2 % (ref 11.5–15.5)
WBC: 12.6 10*3/uL — ABNORMAL HIGH (ref 4.0–10.5)
nRBC: 0 % (ref 0.0–0.2)

## 2021-03-16 LAB — LIPASE, BLOOD: Lipase: 29 U/L (ref 11–51)

## 2021-03-16 MED ORDER — SODIUM CHLORIDE 0.9 % IV BOLUS: 1000.0000 mL | Freq: Once | INTRAVENOUS | Status: DC

## 2021-03-16 NOTE — ED Triage Notes (Signed)
Pt states that he has had N/V x 2 days. Denies abdominal pain. Sent here for dehydration by UC. Alert and oriented.

## 2021-03-16 NOTE — Discharge Instructions (Addendum)
Please go the hospital as soon as you leave urgent care for further evaluation and management.

## 2021-03-16 NOTE — ED Provider Notes (Signed)
Emergency Medicine Provider Triage Evaluation Note  Lakin Ramsour. , a 70 y.o. male  was evaluated in triage.  Pt complains of vomiting x 2 days.  Pt sent here from urgent care for evaluation of dehydration   Review of Systems  Positive: Vomiting  Negative: No diarrhea   Physical Exam  BP (!) 140/98 (BP Location: Left Arm)   Pulse 88   Temp 98.7 F (37.1 C) (Oral)   Resp 16   Ht '6\' 3"'$  (1.905 m)   Wt 133.8 kg   SpO2 96%   BMI 36.87 kg/m  Gen:   Awake, no distress   Resp:  Normal effort  MSK:   Moves extremities without difficulty  Other:    Medical Decision Making  Medically screening exam initiated at 5:30 PM.  Appropriate orders placed.  [name]. was informed that the remainder of the evaluation will be completed by another provider, this initial triage assessment does not replace that evaluation, and the importance of remaining in the ED until their evaluation is complete.     Sidney Ace 03/16/21 1730    Dorie Rank, MD 03/17/21 [phone]

## 2021-03-16 NOTE — ED Triage Notes (Signed)
Patient has possible food poisoning on Tuesday night.  Patient has been vomiting since Wednesday.  No diarrhea, taken Promethazine for vomiting.

## 2021-03-16 NOTE — ED Provider Notes (Signed)
[facility]    CSN: [id number] Arrival date & time: 03/16/21  1542      History   Chief Complaint Chief Complaint  Patient presents with   Emesis    HPI Connor Becker. is a 70 y.o. male.   Patient presents with nausea and vomiting that has been present for 2 days.  No diarrhea or constipation present. No blood in emesis.  Patient states that he might possibly have food poisoning as symptoms started directly after eating food.  Patient has taken two doses of 25 mg promethazine that have not improved nausea.  Denies any chest pain or shortness of breath.  Denies any headache, dizziness, blurred vision.  Patient does have history of diabetes.  Blood sugars have been ranging in the 120s.  Patient states that he has only been able to urinate once today.   Emesis  Past Medical History:  Diagnosis Date   ALLERGIC RHINITIS    Chronic pain of both knees    DJD (degenerative joint disease) of knee    right   DM2 (diabetes mellitus, type 2) (HCC)    GERD    Glaucoma    History of nuclear stress test    Myoview 12/17: EF 53, no ischemia, Low Risk   HYPERLIPIDEMIA    myalgias from Lipitor   HYPOGONADISM, MALE    Hypothyroidism    INSOMNIA-SLEEP DISORDER-UNSPEC    Joint pain    Obesity    OSTEOARTHRITIS    SLEEP APNEA, OBSTRUCTIVE    no CPAP; repeat sleep test pending 07/2016   Unspecified hypothyroidism 12/04/2013   Vitamin D deficiency     Patient Active Problem List   Diagnosis Date Noted   Intermittent claudication ([place]) 10/19/2020   Elevated morning serum cortisol level 10/19/2020   Diarrhea of presumed infectious origin    Sepsis ([place]) 07/01/2020   Dyslipidemia 04/13/2020   Type 2 diabetes mellitus with hyperglycemia, without long-term current use of insulin ([place]) 04/13/2020   Insulin resistance 07/01/2018   Orthostatic hypotension 05/19/2018   Type 2 diabetes mellitus with hyperglycemia ([place]) 05/19/2018   Vitamin D deficiency disease 05/19/2018    Obstructive sleep apnea 05/19/2018   Glaucoma 05/19/2018   Class 3 severe obesity with serious comorbidity and body mass index (BMI) of 40.0 to 44.9 in adult [facility]) 05/19/2018   Fatigue 02/01/2016   Dizziness 02/01/2016   PSA elevation 11/17/2015   Osteoarthrosis involving lower leg 11/17/2015   Ulnar neuritis 04/14/2015   Acute sinus infection 04/27/2014   Primary hypothyroidism 12/04/2013   Morbid obesity ([place]) 12/04/2013   Right shoulder pain 10/31/2010   Preventative health care 10/28/2010   Diabetes ([place]) 10/26/2010   INSOMNIA-SLEEP DISORDER-UNSPEC 07/11/2007   GERD 07/11/2007   Hypogonadism in male 04/06/2007   Mixed hyperlipidemia 04/06/2007   ERECTILE DYSFUNCTION 04/06/2007   SLEEP APNEA, OBSTRUCTIVE 04/06/2007   Allergic rhinitis 04/06/2007   OSTEOARTHRITIS 04/06/2007    Past Surgical History:  Procedure Laterality Date   NASAL SEPTUM SURGERY     TONSILLECTOMY     UVULOPALATOPHARYNGOPLASTY     VASECTOMY         Home Medications    Prior to Admission medications   Medication Sig Start Date End Date Taking? Authorizing Provider  Continuous Blood Gluc Receiver (FREESTYLE LIBRE 2 READER) DEVI 1 Device by Does not apply route as directed. 08/15/20  Yes Shamleffer, Melanie Crazier, MD  Continuous Blood Gluc Sensor (FREESTYLE LIBRE 2 SENSOR) MISC 1 Device by Does not apply route  in the morning, at noon, and at bedtime. 08/15/20  Yes Shamleffer, Melanie Crazier, MD  dicyclomine (BENTYL) 20 MG tablet Take 1 tablet (20 mg total) by mouth 2 (two) times daily. 07/10/20  Yes Caccavale, Sophia, PA-C  EPINEPHrine 0.3 mg/0.3 mL IJ SOAJ injection Inject 0.3 mg into the muscle as needed for anaphylaxis. 07/14/20  Yes Ghumman, Ramandeep, NP  glipiZIDE (GLUCOTROL) 5 MG tablet Take 1 tablet (5 mg total) by mouth daily before breakfast. 03/01/21  Yes Shamleffer, Melanie Crazier, MD  HYDROcodone-acetaminophen (NORCO/VICODIN) 5-325 MG tablet Take 2 tablets by mouth every 4 (four) hours as  needed. 09/23/20  Yes Providence Lanius A, PA-C  latanoprost (XALATAN) 0.005 % ophthalmic solution Place 1 drop into both eyes daily.   Yes [provider]  levothyroxine (SYNTHROID) 112 MCG tablet Take 1 tablet by mouth once daily 02/06/21  Yes Glendale Chard, MD  loperamide (IMODIUM) 2 MG capsule Take 1 capsule (2 mg total) by mouth 4 (four) times daily as needed for diarrhea or loose stools. 07/10/20  Yes Caccavale, Sophia, PA-C  Magnesium 400 MG CAPS Take 400 mg by mouth at bedtime. 03/11/19  Yes Glendale Chard, MD  methocarbamol (ROBAXIN) 750 MG tablet Take 750 mg by mouth 4 (four) times daily as needed for muscle spasms.   Yes [provider]  Multiple Vitamins-Minerals (MULTIVITAMIN/EXTRA VITAMIN D3) CHEW Chew 1 tablet by mouth daily.   Yes [provider]  omeprazole (PRILOSEC) 20 MG capsule Take 20 mg by mouth daily as needed (acid reflux).   Yes [provider]  ONETOUCH VERIO test strip USE STRIP TO CHECK BLOOD SUGAR UP TO 4 TIMES PER DAY 06/04/18  Yes Glendale Chard, MD  promethazine (PHENERGAN) 12.5 MG tablet Take 1 tablet (12.5 mg total) by mouth every 6 (six) hours as needed for nausea or vomiting. 09/23/20  Yes Volanda Napoleon, PA-C  pseudoephedrine (SUDAFED) 30 MG tablet Take 60 mg by mouth 2 (two) times daily as needed for congestion.   Yes [provider]  rosuvastatin (CRESTOR) 20 MG tablet Take 1 tablet (20 mg total) by mouth daily. 03/01/21  Yes Shamleffer, Melanie Crazier, MD  Semaglutide, 1 MG/DOSE, (OZEMPIC, 1 MG/DOSE,) 4 MG/3ML SOPN Inject 1 mg into the skin once a week. 03/01/21  Yes Shamleffer, Melanie Crazier, MD  tadalafil (CIALIS) 5 MG tablet Take 5 mg by mouth daily. 04/21/18  Yes [provider]  testosterone enanthate (DELATESTRYL) 200 MG/ML injection Inject 200 mg into the muscle every 14 (fourteen) days.   Yes [provider]  timolol (TIMOPTIC) 0.5 % ophthalmic solution Place 1 drop into both eyes daily.  06/11/18  Yes [provider]  Turmeric 500 MG CAPS Take 500 mg by mouth daily.   Yes [provider]  vitamin C (ASCORBIC ACID) 500 MG tablet Take 1,000 mg by mouth daily.   Yes [provider]  Vitamin D, Ergocalciferol, (DRISDOL) 1.25 MG (50000 UNIT) CAPS capsule Take 1 capsule (50,000 Units total) by mouth See admin instructions. Take one capsule by mouth on Tues/Fridays 10/19/20  Yes Glendale Chard, MD    Family History Family History  Problem Relation Age of Onset   Diabetes Maternal Uncle    Heart attack Maternal Uncle    Hypertension Other    Prostate cancer Other    Heart disease Other    Heart attack Mother 8       s/p MI   Other Mother        pacemaker   Diabetes  Mother    High blood pressure Mother    High Cholesterol Mother    Thyroid disease Mother    Obesity Mother    Macular degeneration Father    Thyroid disease Father    Colon cancer Neg Hx     Social History Social History   Tobacco Use   Smoking status: Never   Smokeless tobacco: Never  Vaping Use   Vaping Use: Never used  Substance Use Topics   Alcohol use: Not Currently    Comment: occassional beer   Drug use: No     Allergies   Horseradish [armoracia rusticana ext (horseradish)], Lipitor [atorvastatin], Metformin and related, and Soy allergy   Review of Systems Review of Systems Per HPI  Physical Exam Triage Vital Signs ED Triage Vitals  Enc Vitals Group     BP 03/16/21 1607 136/74     Pulse Rate 03/16/21 1607 93     Resp 03/16/21 1607 20     Temp 03/16/21 1607 98.3 F (36.8 C)     Temp Source 03/16/21 1607 Oral     SpO2 03/16/21 1607 95 %     Weight 03/16/21 1612 295 lb (133.8 kg)     Height 03/16/21 1612 '6\' 3"'$  (1.905 m)     Head Circumference --      Peak Flow --      Pain Score 03/16/21 1612 4     Pain Loc --      Pain Edu? --      Excl. in [place]? --    No data found.  Updated Vital Signs BP 136/74 (BP Location: Left Arm)   Pulse 93   Temp  98.3 F (36.8 C) (Oral)   Resp 20   Ht '6\' 3"'$  (1.905 m)   Wt 295 lb (133.8 kg)   SpO2 95%   BMI 36.87 kg/m   Visual Acuity Right Eye Distance:   Left Eye Distance:   Bilateral Distance:    Right Eye Near:   Left Eye Near:    Bilateral Near:     Physical Exam Constitutional:      General: He is not in acute distress.    Appearance: Normal appearance. He is ill-appearing. He is not toxic-appearing or diaphoretic.  HENT:     Head: Normocephalic and atraumatic.     Mouth/Throat:     Mouth: Mucous membranes are moist.  Eyes:     Extraocular Movements: Extraocular movements intact.     Conjunctiva/sclera: Conjunctivae normal.  Cardiovascular:     Rate and Rhythm: Normal rate and regular rhythm.     Pulses: Normal pulses.     Heart sounds: Normal heart sounds.  Pulmonary:     Effort: Pulmonary effort is normal.     Breath sounds: Normal breath sounds.  Abdominal:     General: Bowel sounds are normal. There is no distension.     Palpations: Abdomen is soft.     Tenderness: There is no abdominal tenderness.  Skin:    General: Skin is warm and dry.  Neurological:     General: No focal deficit present.     Mental Status: He is alert and oriented to person, place, and time. Mental status is at baseline.  Psychiatric:        Mood and Affect: Mood normal.        Behavior: Behavior normal.        Thought Content: Thought content normal.        Judgment: Judgment normal.  UC Treatments / Results  Labs (all labs ordered are listed, but only abnormal results are displayed) Labs Reviewed - No data to display  EKG   Radiology No results found.  Procedures Procedures (including critical care time)  Medications Ordered in UC Medications - No data to display  Initial Impression / Assessment and Plan / UC Course  I have reviewed the triage vital signs and the nursing notes.  Pertinent labs & imaging results that were available during my care of the patient were  reviewed by me and considered in my medical decision making (see chart for details).     Patient does appear to need intravenous fluids due to signs of dehydration on exam.  Unfortunately, we are unable to offer IV fluids at urgent care due to more extensive monitoring that is required for IV fluids.  Advised patient that he will need to go to the hospital for further evaluation and management.  Patient will also need further testing due to history of diabetes and nausea and vomiting.  Patient was agreeable with plan.  Vital signs were stable at discharge, so agree with wife transporting patient to the hospital. Final Clinical Impressions(s) / UC Diagnoses   Final diagnoses:  Non-intractable vomiting with nausea, unspecified vomiting type  History of diabetes mellitus  Dehydration     Discharge Instructions      Please go the hospital as soon as you leave urgent care for further evaluation and management.     ED Prescriptions   None    PDMP not reviewed this encounter.   Odis Luster, FNP 03/16/21 1640

## 2021-03-16 NOTE — ED Notes (Signed)
Screening staff advise pt left after turning in his pt stickers. Triage RN notified.

## 2021-03-18 ENCOUNTER — Emergency Department (HOSPITAL_COMMUNITY)
Admission: EM | Admit: 2021-03-18 | Discharge: 2021-03-18 | Disposition: A | Discharge status: Home / Self Care | Payer: Medicare Other | Attending: Emergency Medicine | Admitting: Emergency Medicine

## 2021-03-18 ENCOUNTER — Encounter (HOSPITAL_COMMUNITY): Payer: Self-pay

## 2021-03-18 ENCOUNTER — Other Ambulatory Visit: Payer: Self-pay

## 2021-03-18 ENCOUNTER — Emergency Department (HOSPITAL_COMMUNITY): Payer: Medicare Other

## 2021-03-18 ENCOUNTER — Telehealth (HOSPITAL_COMMUNITY): Payer: Self-pay | Admitting: Student

## 2021-03-18 DIAGNOSIS — E1169 Type 2 diabetes mellitus with other specified complication: Secondary | ICD-10-CM | POA: Diagnosis not present

## 2021-03-18 DIAGNOSIS — Z79899 Other long term (current) drug therapy: Secondary | ICD-10-CM | POA: Insufficient documentation

## 2021-03-18 DIAGNOSIS — E039 Hypothyroidism, unspecified: Secondary | ICD-10-CM | POA: Diagnosis not present

## 2021-03-18 DIAGNOSIS — Z7984 Long term (current) use of oral hypoglycemic drugs: Secondary | ICD-10-CM | POA: Diagnosis not present

## 2021-03-18 DIAGNOSIS — R109 Unspecified abdominal pain: Secondary | ICD-10-CM | POA: Diagnosis not present

## 2021-03-18 DIAGNOSIS — K760 Fatty (change of) liver, not elsewhere classified: Secondary | ICD-10-CM | POA: Diagnosis not present

## 2021-03-18 DIAGNOSIS — K529 Noninfective gastroenteritis and colitis, unspecified: Secondary | ICD-10-CM | POA: Diagnosis not present

## 2021-03-18 DIAGNOSIS — R1110 Vomiting, unspecified: Secondary | ICD-10-CM | POA: Diagnosis not present

## 2021-03-18 DIAGNOSIS — R911 Solitary pulmonary nodule: Secondary | ICD-10-CM | POA: Insufficient documentation

## 2021-03-18 DIAGNOSIS — R1032 Left lower quadrant pain: Secondary | ICD-10-CM | POA: Diagnosis not present

## 2021-03-18 LAB — URINALYSIS, ROUTINE W REFLEX MICROSCOPIC
Bacteria, UA: NONE SEEN
Bilirubin Urine: NEGATIVE
Glucose, UA: NEGATIVE mg/dL
Hgb urine dipstick: NEGATIVE
Leukocytes,Ua: NEGATIVE
Nitrite: NEGATIVE
Specific Gravity, Urine: 1.02 (ref 1.005–1.030)
pH: 6 (ref 5.0–8.0)

## 2021-03-18 LAB — CBC
HCT: 49.8 % (ref 39.0–52.0)
Hemoglobin: 16.7 g/dL (ref 13.0–17.0)
MCH: 31.3 pg (ref 26.0–34.0)
MCHC: 33.5 g/dL (ref 30.0–36.0)
MCV: 93.3 fL (ref 80.0–100.0)
Platelets: 266 10*3/uL (ref 150–400)
RBC: 5.34 MIL/uL (ref 4.22–5.81)
RDW: 13.2 % (ref 11.5–15.5)
WBC: 12.7 10*3/uL — ABNORMAL HIGH (ref 4.0–10.5)
nRBC: 0 % (ref 0.0–0.2)

## 2021-03-18 LAB — COMPREHENSIVE METABOLIC PANEL
ALT: 21 U/L (ref 0–44)
AST: 20 U/L (ref 15–41)
Albumin: 4.1 g/dL (ref 3.5–5.0)
Alkaline Phosphatase: 63 U/L (ref 38–126)
Anion gap: 9 (ref 5–15)
BUN: 18 mg/dL (ref 8–23)
CO2: 24 mmol/L (ref 22–32)
Calcium: 9 mg/dL (ref 8.9–10.3)
Chloride: 106 mmol/L (ref 98–111)
Creatinine, Ser: 1 mg/dL (ref 0.61–1.24)
GFR, Estimated: >60 mL/min (ref >60)
Glucose, Bld: 162 mg/dL — ABNORMAL HIGH (ref 70–99)
Potassium: 3.3 mmol/L — ABNORMAL LOW (ref 3.5–5.1)
Sodium: 139 mmol/L (ref 135–145)
Total Bilirubin: 0.7 mg/dL (ref 0.3–1.2)
Total Protein: 7.9 g/dL (ref 6.5–8.1)

## 2021-03-18 LAB — LIPASE, BLOOD: Lipase: 31 U/L (ref 11–51)

## 2021-03-18 LAB — CBG MONITORING, ED
Glucose-Capillary: 197 mg/dL — ABNORMAL HIGH (ref 70–99)
Glucose-Capillary: 107 mg/dL — ABNORMAL HIGH (ref 70–99)

## 2021-03-18 MED ORDER — DICYCLOMINE HCL 10 MG/ML IM SOLN
20.0000 mg | Freq: Once | INTRAMUSCULAR | Status: AC
  Administered 2021-03-18: 20 mg via INTRAMUSCULAR
  Filled 2021-03-18: qty 2

## 2021-03-18 MED ORDER — IOHEXOL 350 MG/ML SOLN
80.0000 mL | Freq: Once | INTRAVENOUS | Status: AC | PRN
  Administered 2021-03-18: 80 mL via INTRAVENOUS

## 2021-03-18 MED ORDER — SUCRALFATE 1 G PO TABS: 1.0000 g | ORAL_TABLET | Freq: Three times a day (TID) | ORAL | 0 refills | Status: DC

## 2021-03-18 MED ORDER — ONDANSETRON HCL 4 MG/2ML IJ SOLN
4.0000 mg | Freq: Once | INTRAMUSCULAR | Status: AC
  Administered 2021-03-18: 4 mg via INTRAVENOUS
  Filled 2021-03-18: qty 2

## 2021-03-18 MED ORDER — FAMOTIDINE IN NACL 20-0.9 MG/50ML-% IV SOLN
20.0000 mg | Freq: Once | INTRAVENOUS | Status: AC
  Administered 2021-03-18: 20 mg via INTRAVENOUS
  Filled 2021-03-18: qty 50

## 2021-03-18 MED ORDER — ONDANSETRON 4 MG PO TBDP
4.0000 mg | ORAL_TABLET | Freq: Three times a day (TID) | ORAL | 0 refills | Status: DC | PRN
Start: 2021-03-18 — End: 2021-03-18

## 2021-03-18 MED ORDER — ONDANSETRON 4 MG PO TBDP
4.0000 mg | ORAL_TABLET | Freq: Three times a day (TID) | ORAL | 0 refills | Status: DC | PRN
Start: 2021-03-18 — End: 2023-08-13

## 2021-03-18 MED ORDER — METOCLOPRAMIDE HCL 5 MG PO TABS: 5.0000 mg | ORAL_TABLET | Freq: Four times a day (QID) | ORAL | 0 refills | Status: DC | PRN

## 2021-03-18 MED ORDER — DIPHENHYDRAMINE HCL 25 MG PO TABS: 25.0000 mg | ORAL_TABLET | Freq: Four times a day (QID) | ORAL | 0 refills | Status: DC | PRN

## 2021-03-18 MED ORDER — METOCLOPRAMIDE HCL 5 MG/ML IJ SOLN
5.0000 mg | Freq: Once | INTRAMUSCULAR | Status: AC
  Administered 2021-03-18: 5 mg via INTRAVENOUS
  Filled 2021-03-18: qty 2

## 2021-03-18 MED ORDER — LACTATED RINGERS IV BOLUS
1000.0000 mL | Freq: Once | INTRAVENOUS | Status: AC
  Administered 2021-03-18: 1000 mL via INTRAVENOUS

## 2021-03-18 MED ORDER — DIPHENHYDRAMINE HCL 50 MG/ML IJ SOLN
25.0000 mg | Freq: Once | INTRAMUSCULAR | Status: AC
  Administered 2021-03-18: 25 mg via INTRAVENOUS
  Filled 2021-03-18: qty 1

## 2021-03-18 NOTE — ED Triage Notes (Signed)
Pt here for continued abdominal pain, n/v since Tuesday. Now c/o diarrhea since last night. Pt states he feels very weak, as well.

## 2021-03-18 NOTE — ED Notes (Signed)
Pt ambulatory to restroom

## 2021-03-18 NOTE — ED Provider Notes (Signed)
[facility] DEPT Provider Note   CSN: [id number] Arrival date & time: 03/18/21  [id number]     History Chief Complaint  Patient presents with   Abdominal Pain   Nausea    Tri Nowling. is a 70 y.o. male.  70 year old male with history as below presented ER secondary to nausea, vomiting, diarrhea.  Symptoms ongoing for approximately 3 to 4 days.  Symptoms in the past associated with norovirus infection.  Patient difficulty tolerating oral intake since onset of symptoms.  Difficulty taking his oral medications secondary to emesis.  Patient has discomfort to his left lower quadrant and suprapubic region.  Reduced urine output, no dysuria or hematuria.  No black stool or blood per rectum.  No fevers, chills, recent travel or sick contacts.  No recent antibiotic use in the past 6 weeks.  No prior abdominal surgeries reported.  The history is provided by the patient. No language interpreter was used.  Abdominal Pain Associated symptoms: diarrhea, fatigue, nausea and vomiting   Associated symptoms: no chest pain, no chills, no cough, no fever, no hematuria and no shortness of breath       Past Medical History:  Diagnosis Date   ALLERGIC RHINITIS    Chronic pain of both knees    DJD (degenerative joint disease) of knee    right   DM2 (diabetes mellitus, type 2) (HCC)    GERD    Glaucoma    History of nuclear stress test    Myoview 12/17: EF 53, no ischemia, Low Risk   HYPERLIPIDEMIA    myalgias from Lipitor   HYPOGONADISM, MALE    Hypothyroidism    INSOMNIA-SLEEP DISORDER-UNSPEC    Joint pain    Obesity    OSTEOARTHRITIS    SLEEP APNEA, OBSTRUCTIVE    no CPAP; repeat sleep test pending 07/2016   Unspecified hypothyroidism 12/04/2013   Vitamin D deficiency     Patient Active Problem List   Diagnosis Date Noted   Intermittent claudication ([place]) 10/19/2020   Elevated morning serum cortisol level 10/19/2020   Diarrhea of presumed infectious  origin    Sepsis ([place]) 07/01/2020   Dyslipidemia 04/13/2020   Type 2 diabetes mellitus with hyperglycemia, without long-term current use of insulin ([place]) 04/13/2020   Insulin resistance 07/01/2018   Orthostatic hypotension 05/19/2018   Type 2 diabetes mellitus with hyperglycemia ([place]) 05/19/2018   Vitamin D deficiency disease 05/19/2018   Obstructive sleep apnea 05/19/2018   Glaucoma 05/19/2018   Class 3 severe obesity with serious comorbidity and body mass index (BMI) of 40.0 to 44.9 in adult [facility]) 05/19/2018   Fatigue 02/01/2016   Dizziness 02/01/2016   PSA elevation 11/17/2015   Osteoarthrosis involving lower leg 11/17/2015   Ulnar neuritis 04/14/2015   Acute sinus infection 04/27/2014   Primary hypothyroidism 12/04/2013   Morbid obesity ([place]) 12/04/2013   Right shoulder pain 10/31/2010   Preventative health care 10/28/2010   Diabetes ([place]) 10/26/2010   INSOMNIA-SLEEP DISORDER-UNSPEC 07/11/2007   GERD 07/11/2007   Hypogonadism in male 04/06/2007   Mixed hyperlipidemia 04/06/2007   ERECTILE DYSFUNCTION 04/06/2007   SLEEP APNEA, OBSTRUCTIVE 04/06/2007   Allergic rhinitis 04/06/2007   OSTEOARTHRITIS 04/06/2007    Past Surgical History:  Procedure Laterality Date   NASAL SEPTUM SURGERY     TONSILLECTOMY     UVULOPALATOPHARYNGOPLASTY     VASECTOMY         Family History  Problem Relation Age of Onset   Diabetes Maternal Uncle  Heart attack Maternal Uncle    Hypertension Other    Prostate cancer Other    Heart disease Other    Heart attack Mother 66       s/p MI   Other Mother        pacemaker   Diabetes Mother    High blood pressure Mother    High Cholesterol Mother    Thyroid disease Mother    Obesity Mother    Macular degeneration Father    Thyroid disease Father    Colon cancer Neg Hx     Social History   Tobacco Use   Smoking status: Never   Smokeless tobacco: Never  Vaping Use   Vaping Use: Never used  Substance Use Topics   Alcohol use: Not  Currently    Comment: occassional beer   Drug use: No    Home Medications Prior to Admission medications   Medication Sig Start Date End Date Taking? Authorizing Provider  sucralfate (CARAFATE) 1 g tablet Take 1 tablet (1 g total) by mouth 4 (four) times daily -  with meals and at bedtime for 7 days. 03/18/21 03/25/21 Yes Jeanell Sparrow, DO  Continuous Blood Gluc Receiver (FREESTYLE LIBRE 2 READER) DEVI 1 Device by Does not apply route as directed. 08/15/20   Shamleffer, Melanie Crazier, MD  Continuous Blood Gluc Sensor (FREESTYLE LIBRE 2 SENSOR) MISC 1 Device by Does not apply route in the morning, at noon, and at bedtime. 08/15/20   Shamleffer, Melanie Crazier, MD  dicyclomine (BENTYL) 20 MG tablet Take 1 tablet (20 mg total) by mouth 2 (two) times daily. 07/10/20   Caccavale, Sophia, PA-C  EPINEPHrine 0.3 mg/0.3 mL IJ SOAJ injection Inject 0.3 mg into the muscle as needed for anaphylaxis. 07/14/20   Bary Castilla, NP  glipiZIDE (GLUCOTROL) 5 MG tablet Take 1 tablet (5 mg total) by mouth daily before breakfast. 03/01/21   Shamleffer, Melanie Crazier, MD  HYDROcodone-acetaminophen (NORCO/VICODIN) 5-325 MG tablet Take 2 tablets by mouth every 4 (four) hours as needed. 09/23/20   Volanda Napoleon, PA-C  latanoprost (XALATAN) 0.005 % ophthalmic solution Place 1 drop into both eyes daily.    [provider]  levothyroxine (SYNTHROID) 112 MCG tablet Take 1 tablet by mouth once daily 02/06/21   Glendale Chard, MD  loperamide (IMODIUM) 2 MG capsule Take 1 capsule (2 mg total) by mouth 4 (four) times daily as needed for diarrhea or loose stools. 07/10/20   Caccavale, Sophia, PA-C  Magnesium 400 MG CAPS Take 400 mg by mouth at bedtime. 03/11/19   Glendale Chard, MD  methocarbamol (ROBAXIN) 750 MG tablet Take 750 mg by mouth 4 (four) times daily as needed for muscle spasms.    [provider]  Multiple Vitamins-Minerals (MULTIVITAMIN/EXTRA VITAMIN D3) CHEW Chew 1 tablet by mouth daily.     [provider]  omeprazole (PRILOSEC) 20 MG capsule Take 20 mg by mouth daily as needed (acid reflux).    [provider]  ondansetron (ZOFRAN ODT) 4 MG disintegrating tablet Take 1 tablet (4 mg total) by mouth every 8 (eight) hours as needed for nausea or vomiting. 03/18/21   Sherrill Raring, PA-C  ONETOUCH VERIO test strip USE STRIP TO CHECK BLOOD SUGAR UP TO 4 TIMES PER DAY 06/04/18   Glendale Chard, MD  promethazine (PHENERGAN) 12.5 MG tablet Take 1 tablet (12.5 mg total) by mouth every 6 (six) hours as needed for nausea or vomiting. 09/23/20   Volanda Napoleon, PA-C  pseudoephedrine (SUDAFED)  30 MG tablet Take 60 mg by mouth 2 (two) times daily as needed for congestion.    [provider]  rosuvastatin (CRESTOR) 20 MG tablet Take 1 tablet (20 mg total) by mouth daily. 03/01/21   Shamleffer, Melanie Crazier, MD  Semaglutide, 1 MG/DOSE, (OZEMPIC, 1 MG/DOSE,) 4 MG/3ML SOPN Inject 1 mg into the skin once a week. 03/01/21   Shamleffer, Melanie Crazier, MD  tadalafil (CIALIS) 5 MG tablet Take 5 mg by mouth daily. 04/21/18   [provider]  testosterone enanthate (DELATESTRYL) 200 MG/ML injection Inject 200 mg into the muscle every 14 (fourteen) days.    [provider]  timolol (TIMOPTIC) 0.5 % ophthalmic solution Place 1 drop into both eyes daily. 06/11/18   [provider]  Turmeric 500 MG CAPS Take 500 mg by mouth daily.    [provider]  vitamin C (ASCORBIC ACID) 500 MG tablet Take 1,000 mg by mouth daily.    [provider]  Vitamin D, Ergocalciferol, (DRISDOL) 1.25 MG (50000 UNIT) CAPS capsule Take 1 capsule (50,000 Units total) by mouth See admin instructions. Take one capsule by mouth on Tues/Fridays 10/19/20   Glendale Chard, MD    Allergies    Horseradish Mariea Stable rusticana ext (horseradish)], Lipitor [atorvastatin], Metformin and related, and Soy allergy  Review of Systems   Review of Systems  Constitutional:  Positive  for appetite change and fatigue. Negative for chills and fever.  HENT:  Negative for facial swelling and trouble swallowing.   Eyes:  Negative for photophobia and visual disturbance.  Respiratory:  Negative for cough and shortness of breath.   Cardiovascular:  Negative for chest pain and palpitations.  Gastrointestinal:  Positive for abdominal pain, diarrhea, nausea and vomiting.  Endocrine: Negative for polydipsia and polyuria.  Genitourinary:  Negative for difficulty urinating and hematuria.  Musculoskeletal:  Negative for gait problem and joint swelling.  Skin:  Negative for pallor and rash.  Neurological:  Negative for syncope and headaches.  Psychiatric/Behavioral:  Negative for agitation and confusion.    Physical Exam Updated Vital Signs BP 121/69 (BP Location: Right Arm)   Pulse 74   Temp 98.3 F (36.8 C) (Oral)   Resp 16   Ht '6\' 3"'$  (1.905 m)   Wt 130 kg   SpO2 99%   BMI 35.82 kg/m   Physical Exam Vitals and nursing note reviewed.  Constitutional:      General: He is not in acute distress.    Appearance: He is well-developed. He is obese.  HENT:     Head: Normocephalic and atraumatic.     Right Ear: External ear normal.     Left Ear: External ear normal.     Mouth/Throat:     Mouth: Mucous membranes are moist.  Eyes:     General: No scleral icterus. Cardiovascular:     Rate and Rhythm: Normal rate and regular rhythm.     Pulses: Normal pulses.     Heart sounds: Normal heart sounds.  Pulmonary:     Effort: Pulmonary effort is normal. No respiratory distress.     Breath sounds: Normal breath sounds.  Abdominal:     General: Abdomen is flat.     Palpations: Abdomen is soft.     Tenderness: There is abdominal tenderness in the suprapubic area and left lower quadrant. There is no guarding.     Comments: Nonperitoneal abdomen  Musculoskeletal:        General: Normal range of motion.  Cervical back: Normal range of motion.     Right lower leg: No edema.      Left lower leg: No edema.  Skin:    General: Skin is warm and dry.     Capillary Refill: Capillary refill takes less than 2 seconds.  Neurological:     Mental Status: He is alert and oriented to person, place, and time.  Psychiatric:        Mood and Affect: Mood normal.        Behavior: Behavior normal.    ED Results / Procedures / Treatments   Labs (all labs ordered are listed, but only abnormal results are displayed) Labs Reviewed  COMPREHENSIVE METABOLIC PANEL - Abnormal; Notable for the following components:      Result Value   Potassium 3.3 (*)    Glucose, Bld 162 (*)    All other components within normal limits  CBC - Abnormal; Notable for the following components:   WBC 12.7 (*)    All other components within normal limits  URINALYSIS, ROUTINE W REFLEX MICROSCOPIC - Abnormal; Notable for the following components:   Color, Urine YELLOW (*)    APPearance CLEAR (*)    Ketones, ur TRACE (*)    Protein, ur TRACE (*)    All other components within normal limits  CBG MONITORING, ED - Abnormal; Notable for the following components:   Glucose-Capillary 197 (*)    All other components within normal limits  CBG MONITORING, ED - Abnormal; Notable for the following components:   Glucose-Capillary 107 (*)    All other components within normal limits  LIPASE, BLOOD    EKG None  Radiology CT ABDOMEN PELVIS W CONTRAST  Result Date: 03/18/2021 CLINICAL DATA:  Left lower quadrant abdominal pain with diarrhea, nausea and vomiting for 5 days. No previous relevant surgery. EXAM: CT ABDOMEN AND PELVIS WITH CONTRAST TECHNIQUE: Multidetector CT imaging of the abdomen and pelvis was performed using the standard protocol following bolus administration of intravenous contrast. CONTRAST:  24m OMNIPAQUE IOHEXOL 350 MG/ML SOLN COMPARISON:  Abdominopelvic CT 07/10/2020 and 07/01/2020 FINDINGS: Lower chest: 6 mm subpleural nodule in the right middle lobe (image 5/4) was not seen previously,  although is not definitely new. The lung bases are otherwise clear. There is no pleural or pericardial effusion. Hepatobiliary: Diffuse hepatic steatosis, as before. No focal liver lesion or abnormal enhancement identified. No evidence of gallstones, gallbladder wall thickening or biliary dilatation. Pancreas: Unremarkable. No pancreatic ductal dilatation or surrounding inflammatory changes. Spleen: Normal in size without focal abnormality. Adrenals/Urinary Tract: Both adrenal glands appear normal. Both kidneys appear normal without calculi, hydronephrosis or perinephric soft tissue stranding. The bladder is nearly empty and suboptimally evaluated. Stomach/Bowel: No enteric contrast administered. The stomach appears unremarkable for its degree of distention, although is filled with ingested material. The small bowel is fluid-filled, but not significantly dilated. No evidence of bowel wall thickening or surrounding inflammation. Fatty lesion within the lumen of the proximal transverse colon is unchanged (image 31/2), consistent with a benign lipomatous lesion. There are diverticular changes of the sigmoid colon. The appendix appears normal. Vascular/Lymphatic: There are no enlarged abdominal or pelvic lymph nodes. Mild aortic and branch vessel atherosclerosis without acute vascular findings. Reproductive: The prostate gland appears stable without focal abnormality. Other: No ascites, free air or focal extraluminal fluid collection. Musculoskeletal: No acute or significant osseous findings. Multilevel thoracolumbar spondylosis again noted. IMPRESSION: 1. No acute findings or explanation for the patient's symptoms. Sigmoid diverticulosis without evidence of  acute diverticulitis. 2. Fluid-filled bowel without evidence of obstruction, wall thickening or perforation. 3. Hepatic steatosis. 4.  Aortic Atherosclerosis (ICD10-I70.0). 5. Tiny right middle lobe pulmonary nodule, not seen previously and perhaps not previously  imaged. This appearance is almost certainly benign, and no dedicated follow-up is required if this patient is low risk for bronchogenic carcinoma (and has no known or suspected primary neoplasm). Non-contrast chest CT can be considered in 12 months if patient is high-risk. This recommendation follows the consensus statement: Guidelines for Management of Incidental Pulmonary Nodules Detected on CT Images: From the Fleischner Society 2017; Radiology 2017; 284:228-243. Electronically Signed   By: Richardean Sale M.D.   On: 03/18/2021 11:50    Procedures Procedures   Medications Ordered in ED Medications  lactated ringers bolus 1,000 mL (0 mLs Intravenous Stopped 03/18/21 1055)  famotidine (PEPCID) IVPB 20 mg premix (0 mg Intravenous Stopped 03/18/21 1055)  ondansetron (ZOFRAN) injection 4 mg (4 mg Intravenous Given 03/18/21 0903)  metoCLOPramide (REGLAN) injection 5 mg (5 mg Intravenous Given 03/18/21 1054)  diphenhydrAMINE (BENADRYL) injection 25 mg (25 mg Intravenous Given 03/18/21 1055)  iohexol (OMNIPAQUE) 350 MG/ML injection 80 mL (80 mLs Intravenous Contrast Given 03/18/21 1103)  dicyclomine (BENTYL) injection 20 mg (20 mg Intramuscular Given 03/18/21 1254)    ED Course  I have reviewed the triage vital signs and the nursing notes.  Pertinent labs & imaging results that were available during my care of the patient were reviewed by me and considered in my medical decision making (see chart for details).    MDM Rules/Calculators/A&P                           .  70 year old patient with history and presentation as above.  Abdomen is soft, mild tenderness palpation to left lower quadrant and suprapubic region.  No rebound.  Not peritoneal abdomen.  Vital signs reviewed and are stable.  Is nontoxic-appearing. This patient complains of abdominal pain, diarrhea, emesis; this involves an extensive number of treatment Options and is a complaint that carries with it a high risk of complications  and Morbidity. Serious etiologies considered.    8:45 AM Patient given IV fluids, antiemetics, antacid.  Screening labs ordered.  CT imaging of the abdomen pelvis as concern for acute intra-abdominal pathology.  Labs reviewed and are stable. CT images reviewed and are stable. See report for full findings. Pulmonary nodule noted; d/w family and pt and advised them to f/u in 6 mos for rpt imaging.   Pt re-assessed, reports he is feeling better at this time. He is ambulatory and is tolerating PO.   Favor gastroenteritis (likely viral) as etiology of symptoms today. Discussed oral rehydration, supportive care at home, and very strict return precautions. Advised to RTED for any worsening or worrisome symptoms. He is tolerating PO intake.   The patient improved significantly and was discharged in stable condition. Detailed discussions were had with the patient regarding current findings, and need for close f/u with PCP or on call doctor. The patient has been instructed to return immediately if the symptoms worsen in any way for re-evaluation. Patient verbalized understanding and is in agreement with current care plan. All questions answered prior to discharge.    Final Clinical Impression(s) / ED Diagnoses Final diagnoses:  Abdominal pain, unspecified abdominal location  Gastroenteritis  Pulmonary nodule    Rx / DC Orders ED Discharge Orders          Ordered  metoCLOPramide (REGLAN) 5 MG tablet  Every 6 hours PRN,   Status:  Discontinued        03/18/21 1225    diphenhydrAMINE (BENADRYL) 25 MG tablet  Every 6 hours PRN,   Status:  Discontinued        03/18/21 1225    sucralfate (CARAFATE) 1 g tablet  3 times daily with meals & bedtime        03/18/21 1225             Jeanell Sparrow, DO 03/18/21 1902

## 2021-03-18 NOTE — Discharge Instructions (Addendum)
Please f/u with PCP regarding pulmonary nodule, recommend rpt CT in 6 mos.

## 2021-03-21 ENCOUNTER — Telehealth: Payer: Self-pay

## 2021-03-21 NOTE — Telephone Encounter (Signed)
Transition Care Management Unsuccessful Follow-up Telephone Call  Date of discharge and from where:  03/18/2021 [place]  Attempts:  1st Attempt  Reason for unsuccessful TCM follow-up call:  Left voice message

## 2021-03-31 ENCOUNTER — Encounter: Payer: Self-pay | Admitting: Internal Medicine

## 2021-04-02 ENCOUNTER — Other Ambulatory Visit: Payer: Self-pay | Admitting: Internal Medicine

## 2021-04-02 DIAGNOSIS — R197 Diarrhea, unspecified: Secondary | ICD-10-CM

## 2021-04-02 DIAGNOSIS — Z86010 Personal history of colonic polyps: Secondary | ICD-10-CM

## 2021-04-03 ENCOUNTER — Encounter: Payer: Self-pay | Admitting: Internal Medicine

## 2021-04-19 ENCOUNTER — Encounter: Payer: Self-pay | Admitting: Nurse Practitioner

## 2021-04-19 ENCOUNTER — Ambulatory Visit: Payer: Medicare Other | Admitting: Nurse Practitioner

## 2021-04-19 VITALS — BP 126/94 | HR 84 | Ht 71.0 in | Wt 290.5 lb

## 2021-04-19 DIAGNOSIS — R194 Change in bowel habit: Secondary | ICD-10-CM

## 2021-04-19 DIAGNOSIS — R112 Nausea with vomiting, unspecified: Secondary | ICD-10-CM

## 2021-04-19 DIAGNOSIS — Z86010 Personal history of colonic polyps: Secondary | ICD-10-CM

## 2021-04-19 MED ORDER — PLENVU 140 G PO SOLR: 1.0000 | ORAL | 0 refills | Status: DC

## 2021-04-19 NOTE — Patient Instructions (Addendum)
If you are age 72 or older, your body mass index should be between 23-30. Your Body mass index is 40.52 kg/m. If this is out of the aforementioned range listed, please consider follow up with your Primary Care Provider. __________________________________________________________  The [facility] providers would like to encourage you to use [facility] to communicate with providers for non-urgent requests or questions.  Due to long hold times on the telephone, sending your provider a message by [facility] may be a faster and more efficient way to get a response.  Please allow 48 business hours for a response.  Please remember that this is for non-urgent requests.   You have been scheduled for an endoscopy and colonoscopy. Please follow the written instructions given to you at your visit today. Please pick up your prep supplies at the pharmacy within the next 1-3 days. If you use inhalers (even only as needed), please bring them with you on the day of your procedure.  HOLD all NSAIDS for now  Increase Omeprazole to 20 mg daily  Check your medications at home to see if you are taking Magnesium as this may cause loose stools.  Follow up pending  Thank you for entrusting me with your care and choosing [facility].  Tye Savoy, NP-C

## 2021-04-19 NOTE — Progress Notes (Signed)
ASSESSMENT AND PLAN    # 70 yo male with bowel bowel habit changes and frequent diarrhea since norovirus Infection in December 2021. Some of the bowel changes could be secondary to magnesium supplement ( if actually taking it). Several other considerations such as post-infectious IBS? SIBO? Microscopic colitis. Doubt IBD. Doubt exocrine pancreatic insufficiency in absence of involuntary weight loss or oily stool.  --TSH normal at 3.2 Danis --For evaluation of bowel changes patient will be scheduled for colonoscopy (he is already on a 5-year recall colonoscopy anyway).  The risks and benefits of colonoscopy with possible polypectomy / biopsies were discussed and the patient agrees to proceed.   # Intermittent nausea / vomiting since having Norovirus December 2021.  Doubt delayed gastric emptying secondary to diabetes as his hemoglobin A1c was only 7.6 in August . Post-infectious IBS? Need to rule out PUD in setting of signifcant NSAID intake.  --Hold NSAIDs for now --Increase Omeprazole to once daily ( currently only takes as needed)  # Probable diastasis recti.  Patient was concerned about a hernia . No mention of abdominal hernia on imaging.   # Obesity / hepatic steatosis  # ? History of colon polyps at outside facility.  No polyps on last colonoscopy with [place] December 2017 but he was still put on for a 5-year recall colonoscopy due December 2022  HISTORY OF PRESENT ILLNESS     Chief Complaint : Bowel changes, nausea and vomiting  Connor Becker. is a 70 y.o. male  known to Dr. Fuller Plan with a past medical history significant for diverticulosis, possibly colon polyps, DM2, obesity, HLD, osteoarthritis, sleep apnea, hypothyroidism, glaucoma, GERD. See PMH below for any additional history.   Patient is referred by his PCP for diarrhea .   Patient a Connor Becker call Connor Becker was hospitalized in December 2021 with diarrhea / rectal bleeding.  He was seen by Dr. Collene Mares who is covering our  service for the weekend.  His white count was elevated . CT scan showed portal venous gas of unclear significance.  Stool studies positive for Norovirus.  Follow-up CTs showed resolution of portal venous gas and gastric pneumatosis .   Patient says his bowel habits have never been normal since norovirus infection  He has loose, malodorous postprandial bowel movements several times a week. In between times his daily BMs are soft , only partially formed.  Stools are not oily . No abdominal pain to speak of just discomfort from intestinal gas.  He has not had any blood in his stool . He has Bentyl but doesn't take it very often and not even sure even helpful. He takes imodium as needed when out on a job site at work. Mg+ is on home list but patient doesn't know if he is taking it but didn't realize it can cause loose stool.   Additionally, since norovirus last year he has been having episodes of nausea and vomiting every few months. Seen in ED on 9/3 for nausea / vomiting . WBC was 12.7, hgb 17.7.  He received IV fluids . CTAP negative for any bowel abnormalities   Patient cannot correlate his GI symptoms with any of his medications (though he needs to see if he is taking magnesium).  He mentions that he has a abdominal hernia . He has intentionally lost about 10 pounds over the last year, still trying to lose.    PREVIOUS GI EVALUATIONS:   December 2017 polyp surveillance colonoscopy --Complete exam, good prep --  6 mm sessile polyp removed in the transverse colon.  Left-sided diverticulosis, 20 mm transverse colon lipoma, hemorrhoids  Polyp path -benign colon mucosa  5-year colonoscopy repeat recommended   07/01/20 CTAP wo contrast  IMPRESSION: 1. Portal venous gas in the main portal vein is well as peripheral intrahepatic branches. Non dependent air in the stomach, possibly but not definitively in the gastric wall, equivocal for gastric pneumatosis. No definite gastric wall thickening or  perigastric inflammation. There are no other findings of bowel ischemia or inflammatory change. Portal venous gas is suggestive of bowel ischemia, however there is no evidence of bowel inflammation or wall thickening to support this. Recommend close clinical follow-up. 2. Liquid stool in the colon, can be seen with diarrheal illness. No colonic inflammation. 3. Colonic diverticulosis without diverticulitis. 4. Hepatic steatosis.  07/10/20 CTAP w/ contrast IMPRESSION: 1. Interval resolution of suggestion of portal venous gas or gastric pneumatosis. 2. Fluid within the colonic lumen suggestive of a fast transition state. 3. Scattered colonic diverticulosis with no acute diverticulitis. 4. Intramural lipoma of the proximal transverse colon. 5.  Aortic Atherosclerosis (ICD10-I70.0   03/18/21 CTAP w/ contrast IMPRESSION: 1. No acute findings or explanation for the patient's symptoms. Sigmoid diverticulosis without evidence of acute diverticulitis. 2. Fluid-filled bowel without evidence of obstruction, wall thickening or perforation. 3. Hepatic steatosis. 4.  Aortic Atherosclerosis (ICD10-I70.0). 5. Tiny right middle lobe pulmonary nodule, not seen previously and perhaps not previously imaged. This appearance is almost certainly benign, and no dedicated follow-up is required if this patient is low risk for bronchogenic carcinoma (and has no known or suspected primary neoplasm). Non-contrast chest CT can be considered in 12 months if patient is high-risk. This recommendation follows the consensus statement: Guidelines for Management of Incidental Pulmonary Nodules Detected on CT Images: From the Fleischner Society 2017; Radiology 2017; 284:228-243.     Past Medical History:  Diagnosis Date   ALLERGIC RHINITIS    Chronic pain of both knees    DJD (degenerative joint disease) of knee    right   DM2 (diabetes mellitus, type 2) (HCC)    GERD    Glaucoma    History of nuclear  stress test    Myoview 12/17: EF 53, no ischemia, Low Risk   HYPERLIPIDEMIA    myalgias from Lipitor   HYPOGONADISM, MALE    Hypothyroidism    INSOMNIA-SLEEP DISORDER-UNSPEC    Joint pain    Obesity    OSTEOARTHRITIS    SLEEP APNEA, OBSTRUCTIVE    no CPAP; repeat sleep test pending 07/2016   Unspecified hypothyroidism 12/04/2013   Vitamin D deficiency      Past Surgical History:  Procedure Laterality Date   NASAL SEPTUM SURGERY     TONSILLECTOMY     UVULOPALATOPHARYNGOPLASTY     VASECTOMY     Family History  Problem Relation Age of Onset   Heart attack Mother 74       s/p MI   Other Mother        pacemaker   Diabetes Mother    High blood pressure Mother    High Cholesterol Mother    Thyroid disease Mother    Obesity Mother    Macular degeneration Father    Thyroid disease Father    Diabetes Maternal Uncle    Heart attack Maternal Uncle    Hypertension Other    Prostate cancer Other    Heart disease Other    Colon cancer Neg Hx    Social History  Tobacco Use   Smoking status: Never   Smokeless tobacco: Never  Vaping Use   Vaping Use: Never used  Substance Use Topics   Alcohol use: Not Currently    Comment: occassional beer   Drug use: No   Current Outpatient Medications  Medication Sig Dispense Refill   Continuous Blood Gluc Receiver (FREESTYLE LIBRE 2 READER) DEVI 1 Device by Does not apply route as directed. 1 each 0   Continuous Blood Gluc Sensor (FREESTYLE LIBRE 2 SENSOR) MISC 1 Device by Does not apply route in the morning, at noon, and at bedtime. 2 each 6   dicyclomine (BENTYL) 20 MG tablet Take 1 tablet (20 mg total) by mouth 2 (two) times daily. 20 tablet 0   glipiZIDE (GLUCOTROL) 5 MG tablet Take 1 tablet (5 mg total) by mouth daily before breakfast. 90 tablet 3   HYDROcodone-acetaminophen (NORCO/VICODIN) 5-325 MG tablet Take 2 tablets by mouth every 4 (four) hours as needed. 6 tablet 0   latanoprost (XALATAN) 0.005 % ophthalmic solution Place 1  drop into both eyes daily.     levothyroxine (SYNTHROID) 112 MCG tablet Take 1 tablet by mouth once daily 90 tablet 0   loperamide (IMODIUM) 2 MG capsule Take 1 capsule (2 mg total) by mouth 4 (four) times daily as needed for diarrhea or loose stools. 12 capsule 0   Magnesium 400 MG CAPS Take 400 mg by mouth at bedtime. 90 capsule 1   methocarbamol (ROBAXIN) 750 MG tablet Take 750 mg by mouth 4 (four) times daily as needed for muscle spasms.     Multiple Vitamins-Minerals (MULTIVITAMIN/EXTRA VITAMIN D3) CHEW Chew 1 tablet by mouth daily.     omeprazole (PRILOSEC) 20 MG capsule Take 20 mg by mouth daily as needed (acid reflux).     ondansetron (ZOFRAN ODT) 4 MG disintegrating tablet Take 1 tablet (4 mg total) by mouth every 8 (eight) hours as needed for nausea or vomiting. 20 tablet 0   ONETOUCH VERIO test strip USE STRIP TO CHECK BLOOD SUGAR UP TO 4 TIMES PER DAY 200 each 11   promethazine (PHENERGAN) 12.5 MG tablet Take 1 tablet (12.5 mg total) by mouth every 6 (six) hours as needed for nausea or vomiting. 6 tablet 0   pseudoephedrine (SUDAFED) 30 MG tablet Take 60 mg by mouth 2 (two) times daily as needed for congestion.     rosuvastatin (CRESTOR) 20 MG tablet Take 1 tablet (20 mg total) by mouth daily. 90 tablet 3   Semaglutide, 1 MG/DOSE, (OZEMPIC, 1 MG/DOSE,) 4 MG/3ML SOPN Inject 1 mg into the skin once a week. 9 mL 3   sucralfate (CARAFATE) 1 g tablet Take 1 tablet (1 g total) by mouth 4 (four) times daily -  with meals and at bedtime for 7 days. 28 tablet 0   tadalafil (CIALIS) 5 MG tablet Take 5 mg by mouth daily.  11   testosterone enanthate (DELATESTRYL) 200 MG/ML injection Inject 200 mg into the muscle every 14 (fourteen) days.     timolol (TIMOPTIC) 0.5 % ophthalmic solution Place 1 drop into both eyes daily.  2   Turmeric 500 MG CAPS Take 500 mg by mouth daily.     vitamin C (ASCORBIC ACID) 500 MG tablet Take 1,000 mg by mouth daily.     Vitamin D, Ergocalciferol, (DRISDOL) 1.25 MG  (50000 UNIT) CAPS capsule Take 1 capsule (50,000 Units total) by mouth See admin instructions. Take one capsule by mouth on Tues/Fridays 24 capsule 0  EPINEPHrine 0.3 mg/0.3 mL IJ SOAJ injection Inject 0.3 mg into the muscle as needed for anaphylaxis. (Patient not taking: Reported on 04/19/2021) 1 each 0   No current facility-administered medications for this visit.   Allergies  Allergen Reactions   Horseradish [Armoracia Rusticana Ext (Horseradish)]    Lipitor [Atorvastatin] Other (See Comments)    Leg cramps   Metformin And Related Other (See Comments)    GI upset   Soy Allergy Other (See Comments)    Pt stated (SOY SAUCE) causes angioedema, but has added some soy products back in to diet and no angioedema.      Review of Systems: Positive for allergy/sinus problems.  All other systems reviewed and negative except where noted in HPI.    PHYSICAL EXAM :    Wt Readings from Last 3 Encounters:  04/19/21 290 lb 8 oz (131.8 kg)  03/18/21 286 lb 9.6 oz (130 kg)  03/16/21 295 lb (133.8 kg)    BP (!) 126/94 (BP Location: Left Arm, Patient Position: Sitting, Cuff Size: Normal)   Pulse 84   Ht 5\' 11"  (1.803 m) Comment: height measured without shoes  Wt 290 lb 8 oz (131.8 kg)   BMI 40.52 kg/m  Constitutional:  Generally well appearing male in no acute distress. Psychiatric: Pleasant. Normal mood and affect. Behavior is normal. EENT: Pupils normal.  Conjunctivae are normal. No scleral icterus. Neck supple.  Cardiovascular: Normal rate, regular rhythm. No edema Pulmonary/chest: Effort normal and breath sounds normal. No wheezing, rales or rhonchi. Abdominal: Soft, nondistended, nontender. Bowel sounds active throughout. There are no masses palpable. No hepatomegaly. Neurological: Alert and oriented to person place and time. Skin: Skin is warm and dry. No rashes noted.  Tye Savoy, NP  04/19/2021, 2:32 PM  Cc:  Referring Provider Glendale Chard, MD

## 2021-04-20 NOTE — Progress Notes (Signed)
Reviewed and agree with management plan.  Maleik Vanderzee T. Bryan Omura, MD FACG 

## 2021-04-26 ENCOUNTER — Encounter: Payer: Self-pay | Admitting: Gastroenterology

## 2021-05-01 ENCOUNTER — Telehealth: Payer: Self-pay | Admitting: Nurse Practitioner

## 2021-05-01 MED ORDER — GOLYTELY 236 G PO SOLR: 4000.0000 mL | Freq: Once | ORAL | 0 refills | Status: AC

## 2021-05-01 NOTE — Telephone Encounter (Signed)
Golytely sent to patient's pharmacy. New instructions have been done. I have called and advised that patient's wife to let her know.

## 2021-05-01 NOTE — Telephone Encounter (Signed)
Patients wife called stating his insurance requires PA for the Plenvu please send an alternative medication and call them once done so they know.

## 2021-05-03 ENCOUNTER — Encounter: Payer: Self-pay | Admitting: Gastroenterology

## 2021-05-03 ENCOUNTER — Other Ambulatory Visit: Payer: Self-pay

## 2021-05-03 ENCOUNTER — Ambulatory Visit (AMBULATORY_SURGERY_CENTER): Payer: Medicare Other | Admitting: Gastroenterology

## 2021-05-03 VITALS — BP 119/70 | HR 67 | Temp 96.9°F | Resp 12 | Ht 71.0 in | Wt 290.0 lb

## 2021-05-03 DIAGNOSIS — R112 Nausea with vomiting, unspecified: Secondary | ICD-10-CM | POA: Diagnosis not present

## 2021-05-03 DIAGNOSIS — K319 Disease of stomach and duodenum, unspecified: Secondary | ICD-10-CM

## 2021-05-03 DIAGNOSIS — K5730 Diverticulosis of large intestine without perforation or abscess without bleeding: Secondary | ICD-10-CM

## 2021-05-03 DIAGNOSIS — K449 Diaphragmatic hernia without obstruction or gangrene: Secondary | ICD-10-CM | POA: Diagnosis not present

## 2021-05-03 DIAGNOSIS — R194 Change in bowel habit: Secondary | ICD-10-CM

## 2021-05-03 DIAGNOSIS — Z86010 Personal history of colonic polyps: Secondary | ICD-10-CM

## 2021-05-03 DIAGNOSIS — D122 Benign neoplasm of ascending colon: Secondary | ICD-10-CM | POA: Diagnosis not present

## 2021-05-03 DIAGNOSIS — K640 First degree hemorrhoids: Secondary | ICD-10-CM | POA: Diagnosis not present

## 2021-05-03 DIAGNOSIS — R197 Diarrhea, unspecified: Secondary | ICD-10-CM

## 2021-05-03 DIAGNOSIS — D123 Benign neoplasm of transverse colon: Secondary | ICD-10-CM

## 2021-05-03 DIAGNOSIS — E119 Type 2 diabetes mellitus without complications: Secondary | ICD-10-CM | POA: Diagnosis not present

## 2021-05-03 DIAGNOSIS — G4733 Obstructive sleep apnea (adult) (pediatric): Secondary | ICD-10-CM | POA: Diagnosis not present

## 2021-05-03 DIAGNOSIS — K3189 Other diseases of stomach and duodenum: Secondary | ICD-10-CM | POA: Diagnosis not present

## 2021-05-03 DIAGNOSIS — K2100 Gastro-esophageal reflux disease with esophagitis, without bleeding: Secondary | ICD-10-CM | POA: Diagnosis not present

## 2021-05-03 MED ORDER — OMEPRAZOLE 40 MG PO CPDR: 40.0000 mg | DELAYED_RELEASE_CAPSULE | Freq: Every day | ORAL | 5 refills | Status: DC

## 2021-05-03 MED ORDER — SODIUM CHLORIDE 0.9 % IV SOLN: 500.0000 mL | Freq: Once | INTRAVENOUS | Status: DC

## 2021-05-03 NOTE — Op Note (Signed)
[place] Patient Name: Connor Becker Procedure Date: 05/03/2021 2:44 PM MRN: [id number] Endoscopist: Ladene Artist , MD Age: 70 Referring MD:  Date of Birth: Nov 02, 1950 Gender: Male Account #: [id number] Procedure:                Upper GI endoscopy Indications:              Gastroesophageal reflux disease, Nausea with                            vomiting Medicines:                Monitored Anesthesia Care Procedure:                Pre-Anesthesia Assessment:                           - Prior to the procedure, a History and Physical                            was performed, and patient medications and                            allergies were reviewed. The patient's tolerance of                            previous anesthesia was also reviewed. The risks                            and benefits of the procedure and the sedation                            options and risks were discussed with the patient.                            All questions were answered, and informed consent                            was obtained. Prior Anticoagulants: The patient has                            taken no previous anticoagulant or antiplatelet                            agents. ASA Grade Assessment: III - A patient with                            severe systemic disease. After reviewing the risks                            and benefits, the patient was deemed in                            satisfactory condition to undergo the procedure.  After obtaining informed consent, the endoscope was                            passed under direct vision. Throughout the                            procedure, the patient's blood pressure, pulse, and                            oxygen saturations were monitored continuously. The                            Endoscope was introduced through the mouth, and                            advanced to the second part of duodenum. The  upper                            GI endoscopy was accomplished without difficulty.                            The patient tolerated the procedure well. Scope In: Scope Out: Findings:                 LA Grade B (one or more mucosal breaks greater than                            5 mm, not extending between the tops of two mucosal                            folds) esophagitis with no bleeding was found in                            the distal esophagus.                           The exam of the esophagus was otherwise normal.                           Patchy mildly erythematous mucosa without bleeding                            was found in the gastric body and in the gastric                            antrum. Biopsies were taken with a cold forceps for                            histology.                           A small hiatal hernia was present.  The exam of the stomach was otherwise normal.                           The duodenal bulb and second portion of the                            duodenum were normal. Complications:            No immediate complications. Estimated blood loss:                            None. Estimated Blood Loss:     Estimated blood loss was minimal. Impression:               - LA Grade B reflux esophagitis with no bleeding.                           - Erythematous mucosa in the gastric body and                            antrum. Biopsied.                           - Small hiatal hernia.                           - Normal duodenal bulb and second portion of the                            duodenum. Recommendation:           - Patient has a contact number available for                            emergencies. The signs and symptoms of potential                            delayed complications were discussed with the                            patient. Return to normal activities tomorrow.                            Written discharge  instructions were provided to the                            patient.                           - Resume previous diet.                           - Follow antireflux measures long term.                           - Continue present medications.                           -  Change omeprazole to 40 mg po qd, 1 year of                            refills.                           - Await pathology results. Ladene Artist, MD 05/03/2021 3:32:10 PM This report has been signed electronically.

## 2021-05-03 NOTE — Progress Notes (Signed)
Pt's states no medical or surgical changes since previsit or office visit. 

## 2021-05-03 NOTE — Patient Instructions (Signed)
Please read handouts provided. Continue present medications. Change omeprazole to 40 mg one tablet everyday. Await pathology results. Follow antireflux measures long term. High Fiber Diet.   YOU HAD AN ENDOSCOPIC PROCEDURE TODAY AT [facility]:   Refer to the procedure report that was given to you for any specific questions about what was found during the examination.  If the procedure report does not answer your questions, please call your gastroenterologist to clarify.  If you requested that your care partner not be given the details of your procedure findings, then the procedure report has been included in a sealed envelope for you to review at your convenience later.  YOU SHOULD EXPECT: Some feelings of bloating in the abdomen. Passage of more gas than usual.  Walking can help get rid of the air that was put into your GI tract during the procedure and reduce the bloating. If you had a lower endoscopy (such as a colonoscopy or flexible sigmoidoscopy) you may notice spotting of blood in your stool or on the toilet paper. If you underwent a bowel prep for your procedure, you may not have a normal bowel movement for a few days.  Please Note:  You might notice some irritation and congestion in your nose or some drainage.  This is from the oxygen used during your procedure.  There is no need for concern and it should clear up in a day or so.  SYMPTOMS TO REPORT IMMEDIATELY:  Following lower endoscopy (colonoscopy or flexible sigmoidoscopy):  Excessive amounts of blood in the stool  Significant tenderness or worsening of abdominal pains  Swelling of the abdomen that is new, acute  Fever of 100F or higher  Following upper endoscopy (EGD)  Vomiting of blood or coffee ground material  New chest pain or pain under the shoulder blades  Painful or persistently difficult swallowing  New shortness of breath  Fever of 100F or higher  Black, tarry-looking stools  For urgent or  emergent issues, a gastroenterologist can be reached at any hour by calling [phone]. Do not use MyChart messaging for urgent concerns.    DIET:  We do recommend a small meal at first, but then you may proceed to your regular diet.  Drink plenty of fluids but you should avoid alcoholic beverages for 24 hours.  ACTIVITY:  You should plan to take it easy for the rest of today and you should NOT DRIVE or use heavy machinery until tomorrow (because of the sedation medicines used during the test).    FOLLOW UP: Our staff will call the number listed on your records 48-72 hours following your procedure to check on you and address any questions or concerns that you may have regarding the information given to you following your procedure. If we do not reach you, we will leave a message.  We will attempt to reach you two times.  During this call, we will ask if you have developed any symptoms of COVID 19. If you develop any symptoms (ie: fever, flu-like symptoms, shortness of breath, cough etc.) before then, please call [phone].  If you test positive for Covid 19 in the 2 weeks post procedure, please call and report this information to [place].    If any biopsies were taken you will be contacted by phone or by letter within the next 1-3 weeks.  Please call us at [phone] if you have not heard about the biopsies in 3 weeks.    SIGNATURES/CONFIDENTIALITY: You and/or your care  partner have signed paperwork which will be entered into your electronic medical record.  These signatures attest to the fact that that the information above on your After Visit Summary has been reviewed and is understood.  Full responsibility of the confidentiality of this discharge information lies with you and/or your care-partner.  

## 2021-05-03 NOTE — Progress Notes (Signed)
See 04/19/2021 H&P, no changes.

## 2021-05-03 NOTE — Progress Notes (Signed)
Report to PACU, RN, vss, BBS= Clear.  

## 2021-05-03 NOTE — Op Note (Signed)
[place] Patient Name: Connor Becker Procedure Date: 05/03/2021 2:50 PM MRN: [id number] Endoscopist: Ladene Artist , MD Age: 70 Referring MD:  Date of Birth: 20-Mar-1951 Gender: Male Account #: [id number] Procedure:                Colonoscopy Indications:              Clinically significant diarrhea of unexplained                            origin, Change in bowel habits, Personal history of                            colon polyps, type unknown Medicines:                Monitored Anesthesia Care Procedure:                Pre-Anesthesia Assessment:                           - Prior to the procedure, a History and Physical                            was performed, and patient medications and                            allergies were reviewed. The patient's tolerance of                            previous anesthesia was also reviewed. The risks                            and benefits of the procedure and the sedation                            options and risks were discussed with the patient.                            All questions were answered, and informed consent                            was obtained. Prior Anticoagulants: The patient has                            taken no previous anticoagulant or antiplatelet                            agents. ASA Grade Assessment: III - A patient with                            severe systemic disease. After reviewing the risks                            and benefits, the patient was deemed in  satisfactory condition to undergo the procedure.                           After obtaining informed consent, the colonoscope                            was passed under direct vision. Throughout the                            procedure, the patient's blood pressure, pulse, and                            oxygen saturations were monitored continuously. The                            Olympus CF-HQ190L (Serial#  [id number]) Colonoscope was                            introduced through the anus and advanced to the the                            cecum, identified by appendiceal orifice and                            ileocecal valve. The ileocecal valve, appendiceal                            orifice, and rectum were photographed. The quality                            of the bowel preparation was good. The colonoscopy                            was performed without difficulty. The patient                            tolerated the procedure well. Scope In: 2:58:55 PM Scope Out: 3:15:49 PM Scope Withdrawal Time: 0 hours 14 minutes 39 seconds  Total Procedure Duration: 0 hours 16 minutes 54 seconds  Findings:                 The perianal and digital rectal examinations were                            normal.                           Two sessile polyps were found in the transverse                            colon and ascending colon. The polyps were 6 to 8                            mm in size. These polyps were removed with a cold  snare. Resection and retrieval were complete.                           Scattered small-mouthed diverticula were found in                            the right colon. There was no evidence of                            diverticular bleeding.                           There was a medium-sized lipoma, 25 mm in diameter,                            in the proximal transverse colon.                           Multiple medium-mouthed diverticula were found in                            the left colon. There was no evidence of                            diverticular bleeding.                           Internal hemorrhoids were found during                            retroflexion. The hemorrhoids were small and Grade                            I (internal hemorrhoids that do not prolapse).                           The exam was otherwise without abnormality on                             direct and retroflexion views. Random biospies                            obtained. Complications:            No immediate complications. Estimated blood loss:                            None. Estimated Blood Loss:     Estimated blood loss: none. Impression:               - Two 6 to 8 mm polyps in the transverse colon and                            in the ascending colon, removed with a cold snare.  Resected and retrieved.                           - Mild diverticulosis in the right colon.                           - Medium-sized lipoma in the proximal transverse                            colon.                           - Moderate diverticulosis in the left colon.                           - Internal hemorrhoids.                           - The examination was otherwise normal on direct                            and retroflexion views. Random biopsies obtained. Recommendation:           - Consider repeat colonoscopy vs no repeat due to                            age after studies are complete for surveillance                            based on pathology results.                           - Patient has a contact number available for                            emergencies. The signs and symptoms of potential                            delayed complications were discussed with the                            patient. Return to normal activities tomorrow.                            Written discharge instructions were provided to the                            patient.                           - High fiber diet.                           - Continue present medications.                           - Await pathology results. Ladene Artist,  MD 05/03/2021 3:27:53 PM This report has been signed electronically.

## 2021-05-05 ENCOUNTER — Telehealth: Payer: Self-pay | Admitting: *Deleted

## 2021-05-05 NOTE — Telephone Encounter (Signed)
Attempted to call patient for their post-procedure follow-up call. No answer. Left voicemail for patient.

## 2021-05-05 NOTE — Telephone Encounter (Signed)
No answer for post procedure call back. Unable to leave message. 

## 2021-05-11 ENCOUNTER — Other Ambulatory Visit: Payer: Self-pay | Admitting: Internal Medicine

## 2021-05-11 DIAGNOSIS — E039 Hypothyroidism, unspecified: Secondary | ICD-10-CM

## 2021-05-19 ENCOUNTER — Encounter: Payer: Self-pay | Admitting: Gastroenterology

## 2021-06-02 ENCOUNTER — Other Ambulatory Visit: Payer: Self-pay | Admitting: Internal Medicine

## 2021-06-22 ENCOUNTER — Other Ambulatory Visit: Payer: Medicare Other

## 2021-06-22 DIAGNOSIS — R7989 Other specified abnormal findings of blood chemistry: Secondary | ICD-10-CM | POA: Diagnosis not present

## 2021-06-25 ENCOUNTER — Telehealth: Payer: Medicare Other | Admitting: Nurse Practitioner

## 2021-06-25 DIAGNOSIS — J019 Acute sinusitis, unspecified: Secondary | ICD-10-CM

## 2021-06-25 MED ORDER — FLUTICASONE PROPIONATE 50 MCG/ACT NA SUSP: 2.0000 | Freq: Every day | NASAL | 0 refills | Status: DC

## 2021-06-25 MED ORDER — AMOXICILLIN-POT CLAVULANATE 875-125 MG PO TABS
1.0000 | ORAL_TABLET | Freq: Two times a day (BID) | ORAL | 0 refills | Status: AC
Start: 2021-06-25 — End: 2021-07-02

## 2021-06-25 NOTE — Progress Notes (Signed)
E-Visit for Sinus Problems  We are sorry that you are not feeling well.  Here is how we plan to help!  Based on what you have shared with me it looks like you have sinusitis.  Sinusitis is inflammation and infection in the sinus cavities of the head.  Based on your presentation I believe you most likely have Acute Bacterial Sinusitis.  This is an infection caused by bacteria and is treated with antibiotics. I have prescribed Augmentin 875mg /125mg  one tablet twice daily with food, for 7 days. I am also prescribing Flonase nasal spray, use 2 sprays in each nostril daily until symptoms improve.  You may use an oral decongestant such as Mucinex D or if you have glaucoma or high blood pressure use plain Mucinex. Saline nasal spray help and can safely be used as often as needed for congestion.  If you develop worsening sinus pain, fever or notice severe headache and vision changes, or if symptoms are not better after completion of antibiotic, please schedule an appointment with a health care provider.    Sinus infections are not as easily transmitted as other respiratory infection, however we still recommend that you avoid close contact with loved ones, especially the very young and elderly.  Remember to wash your hands thoroughly throughout the day as this is the number one way to prevent the spread of infection!  Home Care: Only take medications as instructed by your medical team. Complete the entire course of an antibiotic. Do not take these medications with alcohol. A steam or ultrasonic humidifier can help congestion.  You can place a towel over your head and breathe in the steam from hot water coming from a faucet. Avoid close contacts especially the very young and the elderly. Cover your mouth when you cough or sneeze. Always remember to wash your hands.  Get Help Right Away If: You develop worsening fever or sinus pain. You develop a severe head ache or visual changes. Your symptoms persist  after you have completed your treatment plan.  Make sure you Understand these instructions. Will watch your condition. Will get help right away if you are not doing well or get worse.  Thank you for choosing an e-visit.  Your e-visit answers were reviewed by a board certified advanced clinical practitioner to complete your personal care plan. Depending upon the condition, your plan could have included both over the counter or prescription medications.  Please review your pharmacy choice. Make sure the pharmacy is open so you can pick up prescription now. If there is a problem, you may contact your provider through [employer] and have the prescription routed to another pharmacy.  Your safety is important to [place]. If you have drug allergies check your prescription carefully.   For the next 24 hours you can use MyChart to ask questions about today's visit, request a non-urgent call back, or ask for a work or school excuse. You will get an email in the next two days asking about your experience. I hope that your e-visit has been valuable and will speed your recovery.   I have spent at least 5 minutes reviewing and documenting in the patient's chart.

## 2021-06-27 LAB — CORTISOL, URINE, 24 HOUR
24 Hour urine volume (VMAHVA): 2750 mL
CREATININE, URINE: 2.03 g/(24.h) (ref 0.50–2.15)
Cortisol (Ur), Free: 17 mcg/24 h (ref 4.0–50.0)

## 2021-06-28 ENCOUNTER — Ambulatory Visit (INDEPENDENT_AMBULATORY_CARE_PROVIDER_SITE_OTHER): Payer: Medicare Other | Admitting: Internal Medicine

## 2021-06-28 ENCOUNTER — Encounter: Payer: Self-pay | Admitting: Internal Medicine

## 2021-06-28 ENCOUNTER — Other Ambulatory Visit: Payer: Self-pay

## 2021-06-28 VITALS — BP 112/80 | HR 85 | Temp 98.1°F | Ht 71.0 in | Wt 291.0 lb

## 2021-06-28 DIAGNOSIS — Z2821 Immunization not carried out because of patient refusal: Secondary | ICD-10-CM | POA: Diagnosis not present

## 2021-06-28 DIAGNOSIS — R351 Nocturia: Secondary | ICD-10-CM

## 2021-06-28 DIAGNOSIS — I700 Atherosclerosis of aorta: Secondary | ICD-10-CM | POA: Insufficient documentation

## 2021-06-28 DIAGNOSIS — R197 Diarrhea, unspecified: Secondary | ICD-10-CM

## 2021-06-28 DIAGNOSIS — Z79899 Other long term (current) drug therapy: Secondary | ICD-10-CM | POA: Diagnosis not present

## 2021-06-28 DIAGNOSIS — E039 Hypothyroidism, unspecified: Secondary | ICD-10-CM

## 2021-06-28 DIAGNOSIS — E1165 Type 2 diabetes mellitus with hyperglycemia: Secondary | ICD-10-CM

## 2021-06-28 DIAGNOSIS — Z0000 Encounter for general adult medical examination without abnormal findings: Secondary | ICD-10-CM

## 2021-06-28 DIAGNOSIS — Z6841 Body Mass Index (BMI) 40.0 and over, adult: Secondary | ICD-10-CM

## 2021-06-28 LAB — POCT URINALYSIS DIPSTICK
Blood, UA: NEGATIVE
Glucose, UA: NEGATIVE
Leukocytes, UA: NEGATIVE
Nitrite, UA: NEGATIVE
Protein, UA: NEGATIVE
Spec Grav, UA: 1.02 (ref 1.010–1.025)
Urobilinogen, UA: 0.2 E.U./dL
pH, UA: 6 (ref 5.0–8.0)

## 2021-06-28 LAB — POCT UA - MICROALBUMIN
Albumin/Creatinine Ratio, Urine, POC: <30
Creatinine, POC: 300 mg/dL
Microalbumin Ur, POC: 80 mg/L

## 2021-06-28 NOTE — Progress Notes (Signed)
°I,Katawbba Wiggins,acting as a scribe for Robyn N Sanders, MD.,have documented all relevant documentation on the behalf of Robyn N Sanders, MD,as directed by  Robyn N Sanders, MD while in the presence of Robyn N Sanders, MD.  °This visit occurred during the SARS-CoV-2 public health emergency.  Safety protocols were in place, including screening questions prior to the visit, additional usage of staff PPE, and extensive cleaning of exam room while observing appropriate contact time as indicated for disinfecting solutions. ° °Subjective:  °  ° Patient ID: Connor R Matson Jr. , male    DOB: 03/04/1951 , 70 y.o.   MRN: [id number] ° ° °Chief Complaint  °Patient presents with  ° Annual Exam  ° Diabetes  ° Hypertension  ° ° °HPI ° °He is here today for a full physical examination. He is followed by Urology for his prostate exams. He has no specific concerns or complaints at this time.  He is scheduled to see Endo tomorrow. He does report having elevated blood sugars.  ° °Diabetes °He presents for his follow-up diabetic visit. He has type 2 diabetes mellitus. There are no hypoglycemic associated symptoms. Pertinent negatives for hypoglycemia include no dizziness or headaches. There are no diabetic associated symptoms. Pertinent negatives for diabetes include no fatigue, no polydipsia, no polyphagia and no polyuria. There are no hypoglycemic complications. Risk factors for coronary artery disease include diabetes mellitus, dyslipidemia, male sex, sedentary lifestyle and obesity. He is compliant with treatment most of the time. He is following a diabetic diet. He participates in exercise intermittently. His breakfast blood glucose is taken between 8-9 am. His breakfast blood glucose range is generally 130-140 mg/dl. Eye exam is not current.   ° °Past Medical History:  °Diagnosis Date  ° ALLERGIC RHINITIS   ° Chronic pain of both knees   ° DJD (degenerative joint disease) of knee   ° right  ° DM2 (diabetes mellitus, type 2)  (HCC)   ° GERD   ° Glaucoma   ° History of nuclear stress test   ° Myoview 12/17: EF 53, no ischemia, Low Risk  ° HYPERLIPIDEMIA   ° myalgias from Lipitor  ° HYPOGONADISM, MALE   ° Hypothyroidism   ° INSOMNIA-SLEEP DISORDER-UNSPEC   ° Joint pain   ° Obesity   ° OSTEOARTHRITIS   ° SLEEP APNEA, OBSTRUCTIVE   ° no CPAP; repeat sleep test pending 07/2016  ° Unspecified hypothyroidism 12/04/2013  ° Vitamin D deficiency   °  ° °Family History  °Problem Relation Age of Onset  ° Heart attack Mother 60  °     s/p MI  ° Other Mother   °     pacemaker  ° Diabetes Mother   ° High blood pressure Mother   ° High Cholesterol Mother   ° Thyroid disease Mother   ° Obesity Mother   ° Macular degeneration Father   ° Thyroid disease Father   ° Diabetes Maternal Uncle   ° Heart attack Maternal Uncle   ° Hypertension Other   ° Prostate cancer Other   ° Heart disease Other   ° Colon cancer Neg Hx   ° ° ° °Current Outpatient Medications:  °  amoxicillin-clavulanate (AUGMENTIN) 875-125 MG tablet, Take 1 tablet by mouth 2 (two) times daily for 7 days., Disp: 14 tablet, Rfl: 0 °  Continuous Blood Gluc Receiver (FREESTYLE LIBRE 2 READER) DEVI, 1 Device by Does not apply route as directed., Disp: 1 each, Rfl: 0 °  Continuous Blood Gluc Sensor (  Sensor (FREESTYLE LIBRE 2 SENSOR) MISC, USE AS DIRECTED EVERY  14  DAYS, Disp: 2 each, Rfl: 0   EPINEPHrine 0.3 mg/0.3 mL IJ SOAJ injection, Inject 0.3 mg into the muscle as needed for anaphylaxis., Disp: 1 each, Rfl: 0   fluticasone (FLONASE) 50 MCG/ACT nasal spray, Place 2 sprays into both nostrils daily for 14 days., Disp: 16 g, Rfl: 0   HYDROcodone-acetaminophen (NORCO/VICODIN) 5-325 MG tablet, Take 2 tablets by mouth every 4 (four) hours as needed., Disp: 6 tablet, Rfl: 0   latanoprost (XALATAN) 0.005 % ophthalmic solution, Place 1 drop into both eyes daily., Disp: , Rfl:    levothyroxine (SYNTHROID) 112 MCG tablet, Take 1 tablet by mouth once daily, Disp: 90 tablet, Rfl: 0   lisinopril (ZESTRIL) 5 MG  tablet, Take 5 mg by mouth daily. Called in 90 days refill to [facility], Disp: , Rfl:    loperamide (IMODIUM) 2 MG capsule, Take 1 capsule (2 mg total) by mouth 4 (four) times daily as needed for diarrhea or loose stools., Disp: 12 capsule, Rfl: 0   omeprazole (PRILOSEC) 40 MG capsule, Take 1 capsule (40 mg total) by mouth daily., Disp: 60 capsule, Rfl: 5   ondansetron (ZOFRAN ODT) 4 MG disintegrating tablet, Take 1 tablet (4 mg total) by mouth every 8 (eight) hours as needed for nausea or vomiting., Disp: 20 tablet, Rfl: 0   ONETOUCH VERIO test strip, USE STRIP TO CHECK BLOOD SUGAR UP TO 4 TIMES PER DAY, Disp: 200 each, Rfl: 11   pseudoephedrine (SUDAFED) 30 MG tablet, Take 60 mg by mouth 2 (two) times daily as needed for congestion., Disp: , Rfl:    rosuvastatin (CRESTOR) 20 MG tablet, Take 1 tablet (20 mg total) by mouth daily., Disp: 90 tablet, Rfl: 3   Semaglutide (RYBELSUS PO), Take by mouth., Disp: , Rfl:    tadalafil (CIALIS) 5 MG tablet, Take 5 mg by mouth daily., Disp: , Rfl: 11   timolol (TIMOPTIC) 0.5 % ophthalmic solution, Place 1 drop into both eyes daily., Disp: , Rfl: 2   Turmeric 500 MG CAPS, Take 500 mg by mouth daily., Disp: , Rfl:    Vitamin D, Ergocalciferol, (DRISDOL) 1.25 MG (50000 UNIT) CAPS capsule, Take 1 capsule (50,000 Units total) by mouth See admin instructions. Take one capsule by mouth on Tues/Fridays, Disp: 24 capsule, Rfl: 0   dicyclomine (BENTYL) 20 MG tablet, Take 1 tablet (20 mg total) by mouth 2 (two) times daily., Disp: 20 tablet, Rfl: 0   glipiZIDE (GLUCOTROL) 5 MG tablet, Take 1 tablet (5 mg total) by mouth daily before breakfast. (Patient not taking: Reported on 06/28/2021), Disp: 90 tablet, Rfl: 3   PEG-KCl-NaCl-NaSulf-Na Asc-C (PLENVU) 140 g SOLR, Take 1 kit by mouth as directed. (Patient not taking: Reported on 06/28/2021), Disp: 1 each, Rfl: 0   promethazine (PHENERGAN) 12.5 MG tablet, Take 1 tablet (12.5 mg total) by mouth every 6 (six) hours as needed  for nausea or vomiting. (Patient not taking: Reported on 06/28/2021), Disp: 6 tablet, Rfl: 0   sucralfate (CARAFATE) 1 g tablet, Take 1 tablet (1 g total) by mouth 4 (four) times daily -  with meals and at bedtime for 7 days. (Patient not taking: Reported on 06/28/2021), Disp: 28 tablet, Rfl: 0   testosterone enanthate (DELATESTRYL) 200 MG/ML injection, Inject 200 mg into the muscle every 14 (fourteen) days. (Patient not taking: Reported on 06/28/2021), Disp: , Rfl:    vitamin C (ASCORBIC ACID) 500 MG tablet, Take 1,000 mg  by mouth daily. (Patient not taking: Reported on 06/28/2021), Disp: , Rfl:    Allergies  Allergen Reactions   Horseradish [Armoracia Rusticana Ext (Horseradish)]    Lipitor [Atorvastatin] Other (See Comments)    Leg cramps   Metformin And Related Other (See Comments)    GI upset   Soy Allergy Other (See Comments)    Pt stated (SOY SAUCE) causes angioedema, but has added some soy products back in to diet and no angioedema.      Men's preventive visit. Patient Health Questionnaire (PHQ-2) is  Flowsheet Row Clinical Support from 10/19/2020 in [facility]  PHQ-2 Total Score 0     . Patient is on a "healthy" diet. Marital status: Married. Relevant history for alcohol use is:  Social History   Substance and Sexual Activity  Alcohol Use Not Currently   Comment: occassional beer  . Relevant history for tobacco use is:  Social History   Tobacco Use  Smoking Status Never  Smokeless Tobacco Never  .   Review of Systems  Constitutional: Negative.  Negative for fatigue.  HENT: Negative.    Eyes: Negative.   Respiratory: Negative.    Cardiovascular: Negative.   Gastrointestinal:  Positive for diarrhea.  Endocrine: Negative.  Negative for polydipsia, polyphagia and polyuria.  Genitourinary: Negative.   Musculoskeletal: Negative.   Skin: Negative.   Allergic/Immunologic: Negative.   Neurological: Negative.  Negative for dizziness and headaches.   Hematological: Negative.   Psychiatric/Behavioral: Negative.      Today's Vitals   06/28/21 1014  BP: 112/80  Pulse: 85  Temp: 98.1 F (36.7 C)  Weight: 291 lb (132 kg)  Height: 5' 11" (1.803 m)  PainSc: 0-No pain   Body mass index is 40.59 kg/m.  Wt Readings from Last 3 Encounters:  06/28/21 291 lb (132 kg)  05/03/21 290 lb (131.5 kg)  04/19/21 290 lb 8 oz (131.8 kg)    BP Readings from Last 3 Encounters:  06/28/21 112/80  05/03/21 119/70  04/19/21 (!) 126/94    Objective:  Physical Exam Vitals and nursing note reviewed.  Constitutional:      Appearance: Normal appearance.  HENT:     Head: Normocephalic and atraumatic.     Right Ear: Tympanic membrane, ear canal and external ear normal.     Left Ear: Tympanic membrane, ear canal and external ear normal.     Nose: Nose normal.     Mouth/Throat:     Mouth: Mucous membranes are moist.     Pharynx: Oropharynx is clear.  Eyes:     Extraocular Movements: Extraocular movements intact.     Conjunctiva/sclera: Conjunctivae normal.     Pupils: Pupils are equal, round, and reactive to light.  Cardiovascular:     Rate and Rhythm: Normal rate and regular rhythm.     Pulses: Normal pulses.          Dorsalis pedis pulses are 2+ on the right side and 2+ on the left side.     Heart sounds: Normal heart sounds.  Pulmonary:     Effort: Pulmonary effort is normal.     Breath sounds: Normal breath sounds.  Chest:  Breasts:    Right: Normal. No swelling, bleeding, inverted nipple, mass or nipple discharge.     Left: Normal. No swelling, bleeding, inverted nipple, mass or nipple discharge.  Abdominal:     General: Bowel sounds are normal.     Palpations: Abdomen is soft.     Comments: Obese, soft,  tenderness  °Genitourinary: °   Prostate: Normal.  °   Rectum: Normal. Guaiac result negative.  °Musculoskeletal:     °   General: Normal range of motion.  °   Cervical back: Normal range of motion and neck supple.  °Feet:  °   Right  foot:  °   Protective Sensation: 5 sites tested.  5 sites sensed.  °   Skin integrity: Callus and dry skin present.  °   Toenail Condition: Right toenails are long.  °   Left foot:  °   Protective Sensation: 5 sites tested.  5 sites sensed.  °   Skin integrity: Dry skin present.  °   Toenail Condition: Left toenails are long.  °Skin: °   General: Skin is warm.  °Neurological:  °   General: No focal deficit present.  °   Mental Status: He is alert.  °Psychiatric:     °   Mood and Affect: Mood normal.     °   Behavior: Behavior normal.  °  ° °   °Assessment And Plan:  °  °1. Routine general medical examination at health care facility °Comments: Full exam was performed. DRE deferred, pt followed by Urology. PATIENT IS ADVISED TO GET 30-45 MINUTES REGULAR EXERCISE NO LESS THAN FOUR TO FIVE DAYS PER WEEK - BOTH WEIGHTBEARING EXERCISES AND AEROBIC ARE RECOMMENDED.  PATIENT IS ADVISED TO FOLLOW A HEALTHY DIET WITH AT LEAST SIX FRUITS/VEGGIES PER DAY, DECREASE INTAKE OF RED MEAT, AND TO INCREASE FISH INTAKE TO TWO DAYS PER WEEK.  MEATS/FISH SHOULD NOT BE FRIED, BAKED OR BROILED IS PREFERABLE.  IT IS ALSO IMPORTANT TO CUT BACK ON YOUR SUGAR INTAKE. PLEASE AVOID ANYTHING WITH ADDED SUGAR, CORN SYRUP OR OTHER SWEETENERS. IF YOU MUST USE A SWEETENER, YOU CAN TRY STEVIA. IT IS ALSO IMPORTANT TO AVOID ARTIFICIALLY SWEETENERS AND DIET BEVERAGES. LASTLY, I SUGGEST WEARING SPF 50 SUNSCREEN ON EXPOSED PARTS AND ESPECIALLY WHEN IN THE DIRECT SUNLIGHT FOR AN EXTENDED PERIOD OF TIME.  PLEASE AVOID FAST FOOD RESTAURANTS AND INCREASE YOUR WATER INTAKE. ° °2. Type 2 diabetes mellitus with hyperglycemia, without long-term current use of insulin (HCC) °Comments: Diabetic foot exam was performed. He is encouraged to keep f/u with Endo. I will renew lisinopril, he has not taken this in some time. F/u in 4 wks for BMP. I DISCUSSED WITH THE PATIENT AT LENGTH REGARDING THE GOALS OF GLYCEMIC CONTROL AND POSSIBLE LONG-TERM COMPLICATIONS.  I  ALSO  STRESSED THE IMPORTANCE OF COMPLIANCE WITH HOME GLUCOSE MONITORING, DIETARY RESTRICTIONS INCLUDING AVOIDANCE OF SUGARY DRINKS/PROCESSED FOODS,  ALONG WITH REGULAR EXERCISE.  I  ALSO STRESSED THE IMPORTANCE OF ANNUAL EYE EXAMS, SELF FOOT CARE AND COMPLIANCE WITH OFFICE VISITS. ° °- POCT Urinalysis Dipstick (81002) °- POCT UA - Microalbumin °- CBC °- Hemoglobin A1c °- CMP14+EGFR °- Lipid panel ° °3. Primary hypothyroidism °Comments: Chronic, I will check thyroid panel and adjust meds as needed. Pt advised Endo may adjust meds during his appt.  °- TSH ° °4. Diarrhea, unspecified type °Comments: Chronic, he has had GI eval. We discussed elimination diet. Advised to eliminate both dairy and gluten for two weeks. Advised IB or FD-gard may be helpful.  ° °5. Nocturia °Comments: DRE declined by patient. I will check PSA today.  °- PSA ° °6. Atherosclerosis of aorta (HCC) °Comments: Chronic, encouraged to follow heart healthy lifestyle. I also stressed importance of statin therapy.  ° °7. Class 3 severe obesity due to excess calories with serious   comorbidity and body mass index (BMI) of 40.0 to 44.9 in adult (HCC) °Comments: BMI 40. He is encouraged to initially strive for BMI <35 and aim to exercise at least 150 minutes of exercise per week.  ° °8. Pneumococcal vaccination declined ° °9. Influenza vaccination declined ° °Patient was given opportunity to ask questions. Patient verbalized understanding of the plan and was able to repeat key elements of the plan. All questions were answered to their satisfaction.  ° °I, Robyn N Sanders, MD, have reviewed all documentation for this visit. The documentation on 06/28/21 for the exam, diagnosis, procedures, and orders are all accurate and complete.  ° °THE PATIENT IS ENCOURAGED TO PRACTICE SOCIAL DISTANCING DUE TO THE COVID-19 PANDEMIC.   ° °

## 2021-06-28 NOTE — Patient Instructions (Addendum)
FD-gard or IB-gard to help with diarrhea  Health Maintenance After Age 72 After age 54, you are at a higher risk for certain long-term diseases and infections as well as injuries from falls. Falls are a major cause of broken bones and head injuries in people who are older than age 7. Getting regular preventive care can help to keep you healthy and well. Preventive care includes getting regular testing and making lifestyle changes as recommended by your health care provider. Talk with your health care provider about: Which screenings and tests you should have. A screening is a test that checks for a disease when you have no symptoms. A diet and exercise plan that is right for you. What should I know about screenings and tests to prevent falls? Screening and testing are the best ways to find a health problem early. Early diagnosis and treatment give you the best chance of managing medical conditions that are common after age 75. Certain conditions and lifestyle choices may make you more likely to have a fall. Your health care provider may recommend: Regular vision checks. Poor vision and conditions such as cataracts can make you more likely to have a fall. If you wear glasses, make sure to get your prescription updated if your vision changes. Medicine review. Work with your health care provider to regularly review all of the medicines you are taking, including over-the-counter medicines. Ask your health care provider about any side effects that may make you more likely to have a fall. Tell your health care provider if any medicines that you take make you feel dizzy or sleepy. Strength and balance checks. Your health care provider may recommend certain tests to check your strength and balance while standing, walking, or changing positions. Foot health exam. Foot pain and numbness, as well as not wearing proper footwear, can make you more likely to have a fall. Screenings, including: Osteoporosis  screening. Osteoporosis is a condition that causes the bones to get weaker and break more easily. Blood pressure screening. Blood pressure changes and medicines to control blood pressure can make you feel dizzy. Depression screening. You may be more likely to have a fall if you have a fear of falling, feel depressed, or feel unable to do activities that you used to do. Alcohol use screening. Using too much alcohol can affect your balance and may make you more likely to have a fall. Follow these instructions at home: Lifestyle Do not drink alcohol if: Your health care provider tells you not to drink. If you drink alcohol: Limit how much you have to: 0-1 drink a day for women. 0-2 drinks a day for men. Know how much alcohol is in your drink. In the [place] one drink equals one 12 oz bottle of beer (355 mL), one 5 oz glass of wine (148 mL), or one 1 oz glass of hard liquor (44 mL). Do not use any products that contain nicotine or tobacco. These products include cigarettes, chewing tobacco, and vaping devices, such as e-cigarettes. If you need help quitting, ask your health care provider. Activity  Follow a regular exercise program to stay fit. This will help you maintain your balance. Ask your health care provider what types of exercise are appropriate for you. If you need a cane or walker, use it as recommended by your health care provider. Wear supportive shoes that have nonskid soles. Safety  Remove any tripping hazards, such as rugs, cords, and clutter. Install safety equipment such as grab bars in bathrooms and  safety rails on stairs. Keep rooms and walkways well-lit. General instructions Talk with your health care provider about your risks for falling. Tell your health care provider if: You fall. Be sure to tell your health care provider about all falls, even ones that seem minor. You feel dizzy, tiredness (fatigue), or off-balance. Take over-the-counter and prescription medicines only  as told by your health care provider. These include supplements. Eat a healthy diet and maintain a healthy weight. A healthy diet includes low-fat dairy products, low-fat (lean) meats, and fiber from whole grains, beans, and lots of fruits and vegetables. Stay current with your vaccines. Schedule regular health, dental, and eye exams. Summary Having a healthy lifestyle and getting preventive care can help to protect your health and wellness after age 54. Screening and testing are the best way to find a health problem early and help you avoid having a fall. Early diagnosis and treatment give you the best chance for managing medical conditions that are more common for people who are older than age 85. Falls are a major cause of broken bones and head injuries in people who are older than age 61. Take precautions to prevent a fall at home. Work with your health care provider to learn what changes you can make to improve your health and wellness and to prevent falls. This information is not intended to replace advice given to you by your health care provider. Make sure you discuss any questions you have with your health care provider. Document Revised: 11/21/2020 Document Reviewed: 11/21/2020 Elsevier Patient Education  [place].

## 2021-06-29 ENCOUNTER — Ambulatory Visit (INDEPENDENT_AMBULATORY_CARE_PROVIDER_SITE_OTHER): Payer: Medicare Other | Admitting: Internal Medicine

## 2021-06-29 ENCOUNTER — Encounter: Payer: Self-pay | Admitting: Internal Medicine

## 2021-06-29 VITALS — BP 110/78 | HR 86 | Ht 71.0 in | Wt 293.2 lb

## 2021-06-29 DIAGNOSIS — E782 Mixed hyperlipidemia: Secondary | ICD-10-CM | POA: Diagnosis not present

## 2021-06-29 DIAGNOSIS — E1165 Type 2 diabetes mellitus with hyperglycemia: Secondary | ICD-10-CM | POA: Diagnosis not present

## 2021-06-29 LAB — CBC
Hematocrit: 47.3 % (ref 37.5–51.0)
Hemoglobin: 15.8 g/dL (ref 13.0–17.7)
MCH: 30.2 pg (ref 26.6–33.0)
MCHC: 33.4 g/dL (ref 31.5–35.7)
MCV: 90 fL (ref 79–97)
Platelets: 270 10*3/uL (ref 150–450)
RBC: 5.24 x10E6/uL (ref 4.14–5.80)
RDW: 12.1 % (ref 11.6–15.4)
WBC: 10.3 10*3/uL (ref 3.4–10.8)

## 2021-06-29 LAB — LIPID PANEL
Chol/HDL Ratio: 4.4 ratio (ref 0.0–5.0)
Cholesterol, Total: 168 mg/dL (ref 100–199)
HDL: 38 mg/dL — ABNORMAL LOW (ref >39)
LDL Chol Calc (NIH): 105 mg/dL — ABNORMAL HIGH (ref 0–99)
Triglycerides: 142 mg/dL (ref 0–149)
VLDL Cholesterol Cal: 25 mg/dL (ref 5–40)

## 2021-06-29 LAB — TSH: TSH: 3.24 u[IU]/mL (ref 0.450–4.500)

## 2021-06-29 LAB — HEMOGLOBIN A1C
Est. average glucose Bld gHb Est-mCnc: 197 mg/dL
Hgb A1c MFr Bld: 8.5 % — ABNORMAL HIGH (ref 4.8–5.6)

## 2021-06-29 LAB — PSA: Prostate Specific Ag, Serum: 3.4 ng/mL (ref 0.0–4.0)

## 2021-06-29 MED ORDER — GLIPIZIDE 5 MG PO TABS: 5.0000 mg | ORAL_TABLET | Freq: Two times a day (BID) | ORAL | 2 refills | Status: DC

## 2021-06-29 MED ORDER — PIOGLITAZONE HCL 15 MG PO TABS: 15.0000 mg | ORAL_TABLET | Freq: Every day | ORAL | 3 refills | Status: DC

## 2021-06-29 NOTE — Progress Notes (Signed)
`    Name: Connor Becker.  Age/ Sex: 70 y.o., male   MRN/ DOB: [id number], Apr 19, 1951     PCP: Glendale Chard, MD   Reason for Endocrinology Evaluation: Type 2 Diabetes Mellitus  Initial Endocrine Consultative Visit: 04/13/2020    PATIENT IDENTIFIER: [name] is a 70 y.o. male with a past medical history of T2DM,OSA, hypogonadism and dyslipidemia. The patient has followed with Endocrinology clinic since 04/13/2020 for consultative assistance with management of his diabetes.     DIABETIC HISTORY:  [name] was diagnosed with DM2 in 2014,Metformin- GI side effect, Farxiga -severe urinary frequency preventing him from sleeping at night.  His hemoglobin A1c has ranged from 6.6%  in 2020, peaking at 7.7% in 2021.  On his initial visit to our clinic his A1c was 7.7%  ,  He was not on any glycemic medications. Started Rybelsus     SUBJECTIVE:   During the last visit (03/01/2021): A1c 7.6 % We switched  Rybelsus to ozempic  and continued Glipizide      Today (06/29/2021): [name] is here for a follow up on diabetes.  He checks his blood sugars multiple times a day through CGM. The patient has not had hypoglycemic episodes since the last clinic visit.  Had an episode of gastroenteritis requiring ED visit 03/2021  Rybelsus causes gas and loose stools  He has not  been taking Glipizide  He was also not taking Crestor until recently when he restarted    HOME DIABETES REGIMEN:  Rybelsus 7 mg daily  Glipizide 5 mg daily      Statin: yes ACE-I/ARB: yes   CONTINUOUS GLUCOSE MONITORING RECORD INTERPRETATION    Dates of Recording: 11/18-12/15/2022  Sensor description:freestyle libre   Results statistics:   CGM use % of time 92  Average and SD 179/19.6  Time in range   56  %  % Time Above 180 40  % Time above 250 4  % Time Below target 0      Glycemic patterns summary: BG's upper limit of goa during the day with hyperglycemia noted after  supper   Hyperglycemic episodes  Post prandial   Hypoglycemic episodes occurred N.A  Overnight periods: trends down     DIABETIC COMPLICATIONS: Microvascular complications:   Denies: CKD, retinopathy, neuropathy Last Eye Exam: Completed 02/2021  Macrovascular complications:   Denies: CAD, CVA, PVD   HISTORY:  Past Medical History:  Past Medical History:  Diagnosis Date   ALLERGIC RHINITIS    Chronic pain of both knees    DJD (degenerative joint disease) of knee    right   DM2 (diabetes mellitus, type 2) (HCC)    GERD    Glaucoma    History of nuclear stress test    Myoview 12/17: EF 53, no ischemia, Low Risk   HYPERLIPIDEMIA    myalgias from Lipitor   HYPOGONADISM, MALE    Hypothyroidism    INSOMNIA-SLEEP DISORDER-UNSPEC    Joint pain    Obesity    OSTEOARTHRITIS    SLEEP APNEA, OBSTRUCTIVE    no CPAP; repeat sleep test pending 07/2016   Unspecified hypothyroidism 12/04/2013   Vitamin D deficiency    Past Surgical History:  Past Surgical History:  Procedure Laterality Date   NASAL SEPTUM SURGERY     TONSILLECTOMY     UVULOPALATOPHARYNGOPLASTY     VASECTOMY     Social History:  reports that he has never smoked. He has never used smokeless  tobacco. He reports that he does not currently use alcohol. He reports that he does not use drugs. Family History:  Family History  Problem Relation Age of Onset   Heart attack Mother 67       s/p MI   Other Mother        pacemaker   Diabetes Mother    High blood pressure Mother    High Cholesterol Mother    Thyroid disease Mother    Obesity Mother    Macular degeneration Father    Thyroid disease Father    Diabetes Maternal Uncle    Heart attack Maternal Uncle    Hypertension Other    Prostate cancer Other    Heart disease Other    Colon cancer Neg Hx      HOME MEDICATIONS: Allergies as of 06/29/2021       Reactions   Horseradish [armoracia Rusticana Ext (horseradish)]    Lipitor [atorvastatin] Other  (See Comments)   Leg cramps   Metformin And Related Other (See Comments)   GI upset   Soy Allergy Other (See Comments)   Pt stated (SOY SAUCE) causes angioedema, but has added some soy products back in to diet and no angioedema.         Medication List        Accurate as of June 29, 2021  9:55 AM. If you have any questions, ask your nurse or doctor.          amoxicillin-clavulanate 875-125 MG tablet Commonly known as: AUGMENTIN Take 1 tablet by mouth 2 (two) times daily for 7 days.   dicyclomine 20 MG tablet Commonly known as: BENTYL Take 1 tablet (20 mg total) by mouth 2 (two) times daily.   EPINEPHrine 0.3 mg/0.3 mL Soaj injection Commonly known as: EPI-PEN Inject 0.3 mg into the muscle as needed for anaphylaxis.   fluticasone 50 MCG/ACT nasal spray Commonly known as: FLONASE Place 2 sprays into both nostrils daily for 14 days.   FreeStyle Libre 2 Reader Devi 1 Device by Does not apply route as directed.   FreeStyle Libre 2 Sensor Misc USE AS DIRECTED EVERY  14  DAYS   glipiZIDE 5 MG tablet Commonly known as: GLUCOTROL Take 1 tablet (5 mg total) by mouth daily before breakfast.   HYDROcodone-acetaminophen 5-325 MG tablet Commonly known as: NORCO/VICODIN Take 2 tablets by mouth every 4 (four) hours as needed.   latanoprost 0.005 % ophthalmic solution Commonly known as: XALATAN Place 1 drop into both eyes daily.   levothyroxine 112 MCG tablet Commonly known as: SYNTHROID Take 1 tablet by mouth once daily   lisinopril 5 MG tablet Commonly known as: ZESTRIL Take 5 mg by mouth daily. Called in 90 days refill to [facility]   loperamide 2 MG capsule Commonly known as: IMODIUM Take 1 capsule (2 mg total) by mouth 4 (four) times daily as needed for diarrhea or loose stools.   omeprazole 40 MG capsule Commonly known as: PRILOSEC Take 1 capsule (40 mg total) by mouth daily.   ondansetron 4 MG disintegrating tablet Commonly known as: Zofran ODT Take 1  tablet (4 mg total) by mouth every 8 (eight) hours as needed for nausea or vomiting.   OneTouch Verio test strip Generic drug: glucose blood USE STRIP TO CHECK BLOOD SUGAR UP TO 4 TIMES PER DAY   Plenvu 140 g Solr Generic drug: PEG-KCl-NaCl-NaSulf-Na Asc-C Take 1 kit by mouth as directed.   promethazine 12.5 MG tablet Commonly known as: PHENERGAN  Take 1 tablet (12.5 mg total) by mouth every 6 (six) hours as needed for nausea or vomiting.   pseudoephedrine 30 MG tablet Commonly known as: SUDAFED Take 60 mg by mouth 2 (two) times daily as needed for congestion.   rosuvastatin 20 MG tablet Commonly known as: Crestor Take 1 tablet (20 mg total) by mouth daily.   RYBELSUS PO Take by mouth.   sucralfate 1 g tablet Commonly known as: Carafate Take 1 tablet (1 g total) by mouth 4 (four) times daily -  with meals and at bedtime for 7 days.   tadalafil 5 MG tablet Commonly known as: CIALIS Take 5 mg by mouth daily.   testosterone enanthate 200 MG/ML injection Commonly known as: DELATESTRYL Inject 200 mg into the muscle every 14 (fourteen) days.   timolol 0.5 % ophthalmic solution Commonly known as: TIMOPTIC Place 1 drop into both eyes daily.   Turmeric 500 MG Caps Take 500 mg by mouth daily.   vitamin C 500 MG tablet Commonly known as: ASCORBIC ACID Take 1,000 mg by mouth daily.   Vitamin D (Ergocalciferol) 1.25 MG (50000 UNIT) Caps capsule Commonly known as: DRISDOL Take 1 capsule (50,000 Units total) by mouth See admin instructions. Take one capsule by mouth on Tues/Fridays         OBJECTIVE:   Vital Signs: BP 110/78 (BP Location: Left Arm, Patient Position: Sitting, Cuff Size: Normal)    Pulse 86    Ht '5\' 11"'  (1.803 m)    Wt 293 lb 3.2 oz (133 kg)    SpO2 97%    BMI 40.89 kg/m   Wt Readings from Last 3 Encounters:  06/29/21 293 lb 3.2 oz (133 kg)  06/28/21 291 lb (132 kg)  05/03/21 290 lb (131.5 kg)     Exam: General: Pt appears well and is in NAD   Lungs: Clear with good BS bilat with no rales, rhonchi, or wheezes  Heart: RRR with normal S1 and S2 and no gallops; no murmurs; no rub  Extremities: Trace pretibial edema.   Neuro: MS is good with appropriate affect, pt is alert and Ox3   DM foot exam: 03/01/2021   The skin of the feet is intact without sores or ulcerations. The pedal pulses are 1+ on right and 1+ on left. The sensation is intact to a screening 5.07, 10 gram monofilament bilaterally     DATA REVIEWED:  Lab Results  Component Value Date   HGBA1C 8.5 (H) 06/28/2021   HGBA1C 0.0 (A) 03/01/2021   HGBA1C 7.6 (H) 02/21/2021      Latest Reference Range & Units 03/18/21 08:50  Sodium 135 - 145 mmol/L 139  Potassium 3.5 - 5.1 mmol/L 3.3 (L)  Chloride 98 - 111 mmol/L 106  CO2 22 - 32 mmol/L 24  Glucose 70 - 99 mg/dL 162 (H)  BUN 8 - 23 mg/dL 18  Creatinine 0.61 - 1.24 mg/dL 1.00  Calcium 8.9 - 10.3 mg/dL 9.0  Anion gap 5 - 15  9  Alkaline Phosphatase 38 - 126 U/L 63  Albumin 3.5 - 5.0 g/dL 4.1  Lipase 11 - 51 U/L 31  AST 15 - 41 U/L 20  ALT 0 - 44 U/L 21  Total Protein 6.5 - 8.1 g/dL 7.9  Total Bilirubin 0.3 - 1.2 mg/dL 0.7  GFR, Estimated >60 mL/min >60    Latest Reference Range & Units 06/22/21 16:04  Cortisol (Ur), Free 4.0 - 50.0 mcg/24 h 17.0   ASSESSMENT / PLAN /  RECOMMENDATIONS:   1) Type 2 Diabetes Mellitus, Poorly controlled, Without complications - Most recent A1c of 8.5% Goal A1c < 7.0 %.    Poorly controlled diabetes due to imperfect adherence to medication and dietary indiscretions, for some reason he was under the impression that he should stop the glipizide, patient advised to start this with below changes -He is intolerant to metformin He is intolerant to [place] due to frequency impeding his restful sleep at night He is intolerant to Rybelsus due 2 episodic  nausea, vomiting, and diarrhea.  We have attempted to switch this to Ozempic as his wife is on it and tolerating it well but this is  not covered by his insurance We discussed add-on therapy with pioglitazone, cautioned against lower extremity edema and weight gain   MEDICATIONS:   -Stop Rybelsus -Restart glipizide 5 mg , 1 tablet before Breakfast and 1 tablet before supper -Start pioglitazone 15 mg daily    EDUCATION / INSTRUCTIONS: BG monitoring instructions: Patient is instructed to check his blood sugars 2 times a day, fasting and bedtime . Call [facility] if: BG persistently < 70  I reviewed the Rule of 15 for the treatment of hypoglycemia in detail with the patient. Literature supplied.    2) Diabetic complications:  Eye: Does not have known diabetic retinopathy.  Neuro/ Feet: Does not have known diabetic peripheral neuropathy .  Renal: Patient does not have known baseline CKD.      3) Elevated Serum cortisol : - Serum cortisol elevated at 24.9 ug/dL on 07/02/2020. He believes he was on prednisone taper for hives at the time - One of the salivary cortisol samples was abnormal - 24-hr urinary cortisol normal at 17 mcg 06/2021     4) Dyslipidemia :    -His LDL is above goal, patient admits to imperfect adherence to rosuvastatin, but had restarted on it a few weeks ago -We discussed the cardiovascular benefits of statin therapy  Medication Continue rosuvastatin 20 mg daily   F/U in 4 months     Signed electronically by: Mack Guise, MD  [facility] Endocr[facility]  [facility] [address] [place], [place] [address] Phone: [phone] FAX: [phone]   CC: Glendale Chard, [address] [place] [address] Phone: [phone]  Fax: [phone]  Return to Endocrinology clinic as below: Future Appointments  Date Time Provider [place]  10/26/2021  9:00 AM [facility] None  10/26/2021 10:00 AM Glendale Chard, MD [facility] None  12/28/2021 10:40 AM Glendale Chard, MD [facility] None  08/02/2022 10:30  AM Glendale Chard, MD [facility] None

## 2021-06-29 NOTE — Patient Instructions (Addendum)
-   Stop Rybelsus  - Restart  Glipizide 5 mg , 1 tablet before Breakfast and 1 tablet before Supper - Start Actos ( Pioglitazone ) 15 mg daily in the morning  - Continue  Crestor 20 mg daily    HOW TO TREAT LOW BLOOD SUGARS (Blood sugar LESS THAN 70 MG/DL) Please follow the RULE OF 15 for the treatment of hypoglycemia treatment (when your (blood sugars are less than 70 mg/dL)   STEP 1: Take 15 grams of carbohydrates when your blood sugar is low, which includes:  3-4 GLUCOSE TABS  OR 3-4 OZ OF JUICE OR REGULAR SODA OR ONE TUBE OF GLUCOSE GEL    STEP 2: RECHECK blood sugar in 15 MINUTES STEP 3: If your blood sugar is still low at the 15 minute recheck --> then, go back to STEP 1 and treat AGAIN with another 15 grams of carbohydrates.

## 2021-07-04 LAB — CMP14+EGFR
ALT: 19 IU/L (ref 0–44)
AST: 21 IU/L (ref 0–40)
Albumin/Globulin Ratio: 1.5 (ref 1.2–2.2)
Albumin: 4.4 g/dL (ref 3.7–4.7)
Alkaline Phosphatase: 88 IU/L (ref 44–121)
BUN/Creatinine Ratio: 7 — ABNORMAL LOW (ref 10–24)
BUN: 8 mg/dL (ref 8–27)
Bilirubin Total: 0.6 mg/dL (ref 0.0–1.2)
CO2: 21 mmol/L (ref 20–29)
Calcium: 9.4 mg/dL (ref 8.6–10.2)
Chloride: 97 mmol/L (ref 96–106)
Creatinine, Ser: 1.08 mg/dL (ref 0.76–1.27)
Globulin, Total: 2.9 g/dL (ref 1.5–4.5)
Glucose: 167 mg/dL — ABNORMAL HIGH (ref 70–99)
Potassium: 4.8 mmol/L (ref 3.5–5.2)
Sodium: 137 mmol/L (ref 134–144)
Total Protein: 7.3 g/dL (ref 6.0–8.5)
eGFR: 73 mL/min/{1.73_m2} (ref >59)

## 2021-07-04 LAB — SPECIMEN STATUS REPORT

## 2021-07-05 ENCOUNTER — Other Ambulatory Visit: Payer: Self-pay | Admitting: Internal Medicine

## 2021-07-05 ENCOUNTER — Encounter: Payer: Self-pay | Admitting: Internal Medicine

## 2021-07-25 NOTE — Telephone Encounter (Signed)
Home health

## 2021-08-14 ENCOUNTER — Other Ambulatory Visit: Payer: Self-pay | Admitting: Internal Medicine

## 2021-08-22 ENCOUNTER — Telehealth (INDEPENDENT_AMBULATORY_CARE_PROVIDER_SITE_OTHER): Payer: Medicare Other | Admitting: Internal Medicine

## 2021-08-22 ENCOUNTER — Encounter: Payer: Self-pay | Admitting: Internal Medicine

## 2021-08-22 ENCOUNTER — Other Ambulatory Visit: Payer: Self-pay | Admitting: Internal Medicine

## 2021-08-22 ENCOUNTER — Other Ambulatory Visit: Payer: Medicare Other

## 2021-08-22 VITALS — Wt 295.0 lb

## 2021-08-22 DIAGNOSIS — R0981 Nasal congestion: Secondary | ICD-10-CM | POA: Diagnosis not present

## 2021-08-22 DIAGNOSIS — E039 Hypothyroidism, unspecified: Secondary | ICD-10-CM

## 2021-08-22 DIAGNOSIS — R051 Acute cough: Secondary | ICD-10-CM | POA: Diagnosis not present

## 2021-08-22 MED ORDER — AZELASTINE HCL 0.1 % NA SOLN: 2.0000 | Freq: Two times a day (BID) | NASAL | 2 refills | Status: DC

## 2021-08-22 MED ORDER — MONTELUKAST SODIUM 10 MG PO TABS: 10.0000 mg | ORAL_TABLET | Freq: Every day | ORAL | 2 refills | Status: DC

## 2021-08-22 NOTE — Progress Notes (Signed)
Virtual Visit via Telephone   This visit type was conducted due to national recommendations for restrictions regarding the COVID-19 Pandemic (e.g. social distancing) in an effort to limit this patient's exposure and mitigate transmission in our community.  Due to his co-morbid illnesses, this patient is at least at moderate risk for complications without adequate follow up.  This format is felt to be most appropriate for this patient at this time.  All issues noted in this document were discussed and addressed.    This visit type was conducted due to national recommendations for restrictions regarding the COVID-19 Pandemic (e.g. social distancing) in an effort to limit this patient's exposure and mitigate transmission in our community.  Patients identity confirmed using two different identifiers.  This format is felt to be most appropriate for this patient at this time.  All issues noted in this document were discussed and addressed.  No physical exam was performed (except for noted visual exam findings with Video Visits).    Date:  08/22/2021   ID:  Connor Copas., DOB Feb 22, 1951, MRN [id number]  Patient Location:  Home  Provider location:   Office  Chief Complaint:  "I have sinus congestion"  History of Present Illness:    Connor Lampert. is a 71 y.o. male who presents via video conferencing for a telehealth visit today.    The patient does not have symptoms concerning for COVID-19 infection (fever, chills, cough, or new shortness of breath).   He presents today for virtual visit. He prefers this method of contact due to COVID-19 pandemic.  He presents for further evaluation cough/cold symptoms. Also with sinus congestion. No fever/chills.     Past Medical History:  Diagnosis Date   ALLERGIC RHINITIS    Chronic pain of both knees    DJD (degenerative joint disease) of knee    right   DM2 (diabetes mellitus, type 2) (HCC)    GERD    Glaucoma    History of nuclear  stress test    Myoview 12/17: EF 53, no ischemia, Low Risk   HYPERLIPIDEMIA    myalgias from Lipitor   HYPOGONADISM, MALE    Hypothyroidism    INSOMNIA-SLEEP DISORDER-UNSPEC    Joint pain    Obesity    OSTEOARTHRITIS    SLEEP APNEA, OBSTRUCTIVE    no CPAP; repeat sleep test pending 07/2016   Unspecified hypothyroidism 12/04/2013   Vitamin D deficiency    Past Surgical History:  Procedure Laterality Date   NASAL SEPTUM SURGERY     TONSILLECTOMY     UVULOPALATOPHARYNGOPLASTY     VASECTOMY       Current Meds  Medication Sig   azelastine (ASTELIN) 0.1 % nasal spray Place 2 sprays into both nostrils 2 (two) times daily. Use in each nostril as directed   Continuous Blood Gluc Receiver (FREESTYLE LIBRE 2 READER) DEVI 1 Device by Does not apply route as directed.   Continuous Blood Gluc Sensor (FREESTYLE LIBRE 2 SENSOR) MISC USE AS DIRECTED EVERY TWO WEEKS   dicyclomine (BENTYL) 20 MG tablet Take 1 tablet (20 mg total) by mouth 2 (two) times daily.   EPINEPHrine 0.3 mg/0.3 mL IJ SOAJ injection Inject 0.3 mg into the muscle as needed for anaphylaxis.   fluticasone (FLONASE) 50 MCG/ACT nasal spray Place 2 sprays into both nostrils daily for 14 days.   glipiZIDE (GLUCOTROL) 5 MG tablet Take 1 tablet (5 mg total) by mouth 2 (two) times daily before a meal.  HYDROcodone-acetaminophen (NORCO/VICODIN) 5-325 MG tablet Take 2 tablets by mouth every 4 (four) hours as needed.   latanoprost (XALATAN) 0.005 % ophthalmic solution Place 1 drop into both eyes daily.   lisinopril (ZESTRIL) 5 MG tablet Take 5 mg by mouth daily. Called in 90 days refill to [facility]   loperamide (IMODIUM) 2 MG capsule Take 1 capsule (2 mg total) by mouth 4 (four) times daily as needed for diarrhea or loose stools.   montelukast (SINGULAIR) 10 MG tablet Take 1 tablet (10 mg total) by mouth daily.   omeprazole (PRILOSEC) 40 MG capsule Take 1 capsule (40 mg total) by mouth daily.   ondansetron (ZOFRAN ODT) 4 MG  disintegrating tablet Take 1 tablet (4 mg total) by mouth every 8 (eight) hours as needed for nausea or vomiting.   ONETOUCH VERIO test strip USE STRIP TO CHECK BLOOD SUGAR UP TO 4 TIMES PER DAY   PEG-KCl-NaCl-NaSulf-Na Asc-C (PLENVU) 140 g SOLR Take 1 kit by mouth as directed.   pioglitazone (ACTOS) 15 MG tablet Take 1 tablet (15 mg total) by mouth daily.   promethazine (PHENERGAN) 12.5 MG tablet Take 1 tablet (12.5 mg total) by mouth every 6 (six) hours as needed for nausea or vomiting.   pseudoephedrine (SUDAFED) 30 MG tablet Take 60 mg by mouth 2 (two) times daily as needed for congestion.   rosuvastatin (CRESTOR) 20 MG tablet Take 1 tablet (20 mg total) by mouth daily.   tadalafil (CIALIS) 5 MG tablet Take 5 mg by mouth daily.   testosterone enanthate (DELATESTRYL) 200 MG/ML injection Inject 200 mg into the muscle every 14 (fourteen) days.   timolol (TIMOPTIC) 0.5 % ophthalmic solution Place 1 drop into both eyes daily.   Turmeric 500 MG CAPS Take 500 mg by mouth daily.   vitamin C (ASCORBIC ACID) 500 MG tablet Take 1,000 mg by mouth daily.   Vitamin D, Ergocalciferol, (DRISDOL) 1.25 MG (50000 UNIT) CAPS capsule Take 1 capsule (50,000 Units total) by mouth See admin instructions. Take one capsule by mouth on Tues/Fridays   [DISCONTINUED] levothyroxine (SYNTHROID) 112 MCG tablet Take 1 tablet by mouth once daily     Allergies:   Horseradish [armoracia rusticana ext (horseradish)], Lipitor [atorvastatin], Metformin and related, Semaglutide(0.25 or 0.35m-dos), and Soy allergy   Social History   Tobacco Use   Smoking status: Never   Smokeless tobacco: Never  Vaping Use   Vaping Use: Never used  Substance Use Topics   Alcohol use: Not Currently    Comment: occassional beer   Drug use: No     Family Hx: The patient's family history includes Diabetes in his maternal uncle and mother; Heart attack in his maternal uncle; Heart attack (age of onset: 676 in his mother; Heart disease in an  other family member; High Cholesterol in his mother; High blood pressure in his mother; Hypertension in an other family member; Macular degeneration in his father; Obesity in his mother; Other in his mother; Prostate cancer in an other family member; Thyroid disease in his father and mother. There is no history of Colon cancer.  ROS:   Please see the history of present illness.    Review of Systems  Constitutional: Negative.  Negative for chills and fever.  HENT:  Positive for congestion. Negative for sinus pain.   Respiratory:  Positive for cough and sputum production. Negative for hemoptysis.        Cough is productive of white sputum  Cardiovascular: Negative.   Gastrointestinal: Negative.  Neurological: Negative.   Psychiatric/Behavioral: Negative.     All other systems reviewed and are negative.   Labs/Other Tests and Data Reviewed:    Recent Labs: 06/28/2021: ALT 19; BUN 8; Creatinine, Ser 1.08; Hemoglobin 15.8; Platelets 270; Potassium 4.8; Sodium 137; TSH 3.240   Recent Lipid Panel Lab Results  Component Value Date/Time   CHOL 168 06/28/2021 10:57 AM   TRIG 142 06/28/2021 10:57 AM   HDL 38 (L) 06/28/2021 10:57 AM   CHOLHDL 4.4 06/28/2021 10:57 AM   CHOLHDL 5 11/17/2015 02:28 PM   LDLCALC 105 (H) 06/28/2021 10:57 AM   LDLDIRECT 148.0 11/17/2015 02:28 PM    Wt Readings from Last 3 Encounters:  08/22/21 295 lb (133.8 kg)  06/29/21 293 lb 3.2 oz (133 kg)  06/28/21 291 lb (132 kg)     Exam:    Vital Signs:  Wt 295 lb (133.8 kg)    BMI 41.14 kg/m     Physical Exam Nursing note reviewed.  Pulmonary:     Comments: Frequent coughing   ASSESSMENT & PLAN:    1. Sinus congestion Comments: I will start montelukast 17m daily..Marland KitchenHe agrees to come for COVID PCR testing as well.  If persistent, will give prednisone dose pack. - Novel Coronavirus, NAA ([facility])  2. Acute cough Comments: LIkely due to postnasal drip. Advised to avoid dairy products. I will add Astelin  NS to current regimen.     COVID-19 Education: The signs and symptoms of COVID-19 were discussed with the patient and how to seek care for testing (follow up with PCP or arrange E-visit).  The importance of social distancing was discussed today.  Patient Risk:   After full review of this patients clinical status, I feel that they are at least moderate risk at this time.  Time:   Today, I have spent 11 minutes/ seconds with the patient with telehealth technology discussing above diagnoses.     Medication Adjustments/Labs and Tests Ordered: Current medicines are reviewed at length with the patient today.  Concerns regarding medicines are outlined above.   Tests Ordered: Orders Placed This Encounter  Procedures   Novel Coronavirus, NAA ([facility])    Medication Changes: Meds ordered this encounter  Medications   azelastine (ASTELIN) 0.1 % nasal spray    Sig: Place 2 sprays into both nostrils 2 (two) times daily. Use in each nostril as directed    Dispense:  30 mL    Refill:  2   montelukast (SINGULAIR) 10 MG tablet    Sig: Take 1 tablet (10 mg total) by mouth daily.    Dispense:  30 tablet    Refill:  2    Disposition:  Follow up prn  Signed, RMaximino Greenland MD

## 2021-08-23 LAB — SARS-COV-2, NAA 2 DAY TAT

## 2021-08-23 LAB — NOVEL CORONAVIRUS, NAA: SARS-CoV-2, NAA: DETECTED — AB

## 2021-08-24 ENCOUNTER — Encounter: Payer: Self-pay | Admitting: Internal Medicine

## 2021-08-24 MED ORDER — MOLNUPIRAVIR EUA 200MG CAPSULE: 4.0000 | ORAL_CAPSULE | Freq: Two times a day (BID) | ORAL | 0 refills | Status: AC

## 2021-08-24 NOTE — Telephone Encounter (Signed)
I will send rx Lagevrio to the pharmacy. Possible side effects were discussed with the patient.   RS

## 2021-10-10 ENCOUNTER — Other Ambulatory Visit: Payer: Self-pay | Admitting: Internal Medicine

## 2021-10-26 ENCOUNTER — Ambulatory Visit (INDEPENDENT_AMBULATORY_CARE_PROVIDER_SITE_OTHER): Payer: Medicare Other | Admitting: Internal Medicine

## 2021-10-26 ENCOUNTER — Encounter: Payer: Self-pay | Admitting: Internal Medicine

## 2021-10-26 ENCOUNTER — Ambulatory Visit: Payer: Medicare Other

## 2021-10-26 VITALS — BP 122/78 | HR 71 | Temp 98.3°F | Ht 71.0 in | Wt 291.6 lb

## 2021-10-26 DIAGNOSIS — E1165 Type 2 diabetes mellitus with hyperglycemia: Secondary | ICD-10-CM | POA: Diagnosis not present

## 2021-10-26 DIAGNOSIS — Z79899 Other long term (current) drug therapy: Secondary | ICD-10-CM

## 2021-10-26 DIAGNOSIS — Z2821 Immunization not carried out because of patient refusal: Secondary | ICD-10-CM

## 2021-10-26 DIAGNOSIS — I700 Atherosclerosis of aorta: Secondary | ICD-10-CM

## 2021-10-26 DIAGNOSIS — E039 Hypothyroidism, unspecified: Secondary | ICD-10-CM

## 2021-10-26 DIAGNOSIS — Z6841 Body Mass Index (BMI) 40.0 and over, adult: Secondary | ICD-10-CM

## 2021-10-26 NOTE — Progress Notes (Signed)
?Rich Brave Llittleton,acting as a Education administrator for Maximino Greenland, MD.,have documented all relevant documentation on the behalf of Maximino Greenland, MD,as directed by  Maximino Greenland, MD while in the presence of Maximino Greenland, MD.  ?This visit occurred during the SARS-CoV-2 public health emergency.  Safety protocols were in place, including screening questions prior to the visit, additional usage of staff PPE, and extensive cleaning of exam room while observing appropriate contact time as indicated for disinfecting solutions. ? ?Subjective:  ?  ? Patient ID: Connor Becker , male    DOB: [birth year] , 71 y.o.   MRN: [id number] ? ? ?Chief Complaint  ?Patient presents with  ? Diabetes  ? ? ?HPI ? ?Patient presents today for a diabetes check. Patient reports compliance with his medications. He is now followed by [facility] for management of his diabetes. He would like to have labwork drawn today, so his provider can review tomorrow during his visit. He has no other concerns at this time.  ? ?Diabetes ?He presents for his follow-up diabetic visit. He has type 2 diabetes mellitus. There are no hypoglycemic associated symptoms. Pertinent negatives for diabetes include no polydipsia, no polyphagia, no polyuria and no visual change. There are no hypoglycemic complications. There are no diabetic complications. Risk factors for coronary artery disease include diabetes mellitus, dyslipidemia, male sex, obesity and sedentary lifestyle. He participates in exercise intermittently. An ACE inhibitor/angiotensin II receptor blocker is being taken. Eye exam is not current.  ?Thyroid Problem ?Presents for follow-up visit. Patient reports no cold intolerance, leg swelling or visual change. The symptoms have been stable.   ? ?Past Medical History:  ?Diagnosis Date  ? ALLERGIC RHINITIS   ? Chronic pain of both knees   ? DJD (degenerative joint disease) of knee   ? right  ? DM2 (diabetes mellitus, type 2) ([facility])   ? GERD   ? Glaucoma    ? History of nuclear stress test   ? Myoview 12/17: EF 53, no ischemia, Low Risk  ? HYPERLIPIDEMIA   ? myalgias from Lipitor  ? HYPOGONADISM, MALE   ? Hypothyroidism   ? INSOMNIA-SLEEP DISORDER-UNSPEC   ? Joint pain   ? Obesity   ? OSTEOARTHRITIS   ? SLEEP APNEA, OBSTRUCTIVE   ? no CPAP; repeat sleep test pending 07/2016  ? Unspecified hypothyroidism 12/04/2013  ? Vitamin D deficiency   ?  ? ?Family History  ?Problem Relation Age of Onset  ? Heart attack Mother 55  ?     s/p MI  ? Other Mother   ?     pacemaker  ? Diabetes Mother   ? High blood pressure Mother   ? High Cholesterol Mother   ? Thyroid disease Mother   ? Obesity Mother   ? Macular degeneration Father   ? Thyroid disease Father   ? Diabetes Maternal Uncle   ? Heart attack Maternal Uncle   ? Hypertension Other   ? Prostate cancer Other   ? Heart disease Other   ? Colon cancer Neg Hx   ? ? ? ?Current Outpatient Medications:  ?  azelastine (ASTELIN) 0.1 % nasal spray, Place 2 sprays into both nostrils 2 (two) times daily. Use in each nostril as directed, Disp: 30 mL, Rfl: 2 ?  Continuous Blood Gluc Receiver (FREESTYLE LIBRE 2 READER) DEVI, 1 Device by Does not apply route as directed., Disp: 1 each, Rfl: 0 ?  Continuous Blood Gluc Sensor (FREESTYLE LIBRE 2 SENSOR) MISC,  USE  AS DIRECTED EVERY TWO WEEKS, Disp: 2 each, Rfl: 3 ?  EPINEPHrine 0.3 mg/0.3 mL IJ SOAJ injection, Inject 0.3 mg into the muscle as needed for anaphylaxis., Disp: 1 each, Rfl: 0 ?  fluticasone (FLONASE) 50 MCG/ACT nasal spray, Place 2 sprays into both nostrils daily for 14 days., Disp: 16 g, Rfl: 0 ?  glipiZIDE (GLUCOTROL) 5 MG tablet, Take 1 tablet (5 mg total) by mouth 2 (two) times daily before a meal., Disp: 180 tablet, Rfl: 2 ?  HYDROcodone-acetaminophen (NORCO/VICODIN) 5-325 MG tablet, Take 2 tablets by mouth every 4 (four) hours as needed., Disp: 6 tablet, Rfl: 0 ?  latanoprost (XALATAN) 0.005 % ophthalmic solution, Place 1 drop into both eyes daily., Disp: , Rfl:  ?   levothyroxine (SYNTHROID) 112 MCG tablet, Take 1 tablet by mouth once daily, Disp: 90 tablet, Rfl: 0 ?  lisinopril (ZESTRIL) 5 MG tablet, Take 5 mg by mouth daily. Called in 90 days refill to [facility], Disp: , Rfl:  ?  loperamide (IMODIUM) 2 MG capsule, Take 1 capsule (2 mg total) by mouth 4 (four) times daily as needed for diarrhea or loose stools., Disp: 12 capsule, Rfl: 0 ?  omeprazole (PRILOSEC) 40 MG capsule, Take 1 capsule (40 mg total) by mouth daily., Disp: 60 capsule, Rfl: 5 ?  ondansetron (ZOFRAN ODT) 4 MG disintegrating tablet, Take 1 tablet (4 mg total) by mouth every 8 (eight) hours as needed for nausea or vomiting., Disp: 20 tablet, Rfl: 0 ?  ONETOUCH VERIO test strip, USE STRIP TO CHECK BLOOD SUGAR UP TO 4 TIMES PER DAY, Disp: 200 each, Rfl: 11 ?  promethazine (PHENERGAN) 12.5 MG tablet, Take 1 tablet (12.5 mg total) by mouth every 6 (six) hours as needed for nausea or vomiting., Disp: 6 tablet, Rfl: 0 ?  pseudoephedrine (SUDAFED) 30 MG tablet, Take 60 mg by mouth 2 (two) times daily as needed for congestion., Disp: , Rfl:  ?  rosuvastatin (CRESTOR) 20 MG tablet, Take 1 tablet (20 mg total) by mouth daily., Disp: 90 tablet, Rfl: 3 ?  Semaglutide,0.25 or 0.'5MG'$ /DOS, (OZEMPIC, 0.25 OR 0.5 MG/DOSE,) 2 MG/1.5ML SOPN, Inject 0.5 mg into the skin once a week., Disp: , Rfl:  ?  tadalafil (CIALIS) 5 MG tablet, Take 5 mg by mouth daily., Disp: , Rfl: 11 ?  testosterone enanthate (DELATESTRYL) 200 MG/ML injection, Inject 200 mg into the muscle every 14 (fourteen) days., Disp: , Rfl:  ?  timolol (TIMOPTIC) 0.5 % ophthalmic solution, Place 1 drop into both eyes daily., Disp: , Rfl: 2 ?  Turmeric 500 MG CAPS, Take 500 mg by mouth daily., Disp: , Rfl:  ?  vitamin C (ASCORBIC ACID) 500 MG tablet, Take 1,000 mg by mouth daily., Disp: , Rfl:  ?  Vitamin D, Ergocalciferol, (DRISDOL) 1.25 MG (50000 UNIT) CAPS capsule, Take 1 capsule (50,000 Units total) by mouth See admin instructions. Take one capsule by mouth on  Tues/Fridays, Disp: 24 capsule, Rfl: 0 ?  dicyclomine (BENTYL) 20 MG tablet, Take 1 tablet (20 mg total) by mouth 2 (two) times daily. (Patient not taking: Reported on 10/26/2021), Disp: 20 tablet, Rfl: 0 ?  montelukast (SINGULAIR) 10 MG tablet, Take 1 tablet (10 mg total) by mouth daily. (Patient not taking: Reported on 10/26/2021), Disp: 30 tablet, Rfl: 2 ?  pioglitazone (ACTOS) 15 MG tablet, Take 1 tablet (15 mg total) by mouth daily. (Patient not taking: Reported on 10/26/2021), Disp: 90 tablet, Rfl: 3  ? ?Allergies  ?Allergen Reactions  ?  Horseradish [Armoracia Rusticana Ext (Horseradish)]   ? Lipitor [Atorvastatin] Other (See Comments)  ?  Leg cramps  ? Metformin And Related Other (See Comments)  ?  GI upset  ? Semaglutide(0.25 Or 0.'5mg'$ -Dos) Nausea And Vomiting  ? Soy Allergy Other (See Comments)  ?  Pt stated (SOY SAUCE) causes angioedema, but has added some soy products back in to diet and no angioedema.   ?  ? ?Review of Systems  ?Constitutional: Negative.   ?Eyes: Negative.   ?Respiratory: Negative.    ?Cardiovascular: Negative.   ?Gastrointestinal: Negative.   ?Endocrine: Negative for cold intolerance, polydipsia, polyphagia and polyuria.  ?Musculoskeletal: Negative.   ?Skin: Negative.   ?Neurological: Negative.   ?Psychiatric/Behavioral: Negative.     ? ?Today's Vitals  ? 10/26/21 0959  ?BP: 122/78  ?Pulse: 71  ?Temp: 98.3 ?F (36.8 ?C)  ?Weight: 291 lb 9.6 oz (132.3 kg)  ?Height: '5\' 11"'$  (1.803 m)  ?PainSc: 0-No pain  ? ?Body mass index is 40.67 kg/m?.  ?Wt Readings from Last 3 Encounters:  ?10/26/21 291 lb 9.6 oz (132.3 kg)  ?08/22/21 295 lb (133.8 kg)  ?06/29/21 293 lb 3.2 oz (133 kg)  ?  ? ?Objective:  ?Physical Exam ?Vitals and nursing note reviewed.  ?Constitutional:   ?   Appearance: Normal appearance.  ?HENT:  ?   Head: Normocephalic and atraumatic.  ?Eyes:  ?   Extraocular Movements: Extraocular movements intact.  ?Cardiovascular:  ?   Rate and Rhythm: Normal rate and regular rhythm.  ?   Heart  sounds: Normal heart sounds.  ?Pulmonary:  ?   Effort: Pulmonary effort is normal.  ?   Breath sounds: Normal breath sounds.  ?Musculoskeletal:  ?   Cervical back: Normal range of motion.  ?Skin: ?   General: Skin is war

## 2021-10-26 NOTE — Patient Instructions (Signed)

## 2021-10-27 ENCOUNTER — Ambulatory Visit: Payer: Medicare Other | Admitting: Internal Medicine

## 2021-10-27 LAB — VITAMIN B12: Vitamin B-12: 408 pg/mL (ref 232–1245)

## 2021-10-27 LAB — CMP14+EGFR
ALT: 25 IU/L (ref 0–44)
AST: 23 IU/L (ref 0–40)
Albumin/Globulin Ratio: 1.4 (ref 1.2–2.2)
Albumin: 4.3 g/dL (ref 3.7–4.7)
Alkaline Phosphatase: 79 IU/L (ref 44–121)
BUN/Creatinine Ratio: 13 (ref 10–24)
BUN: 13 mg/dL (ref 8–27)
Bilirubin Total: 0.5 mg/dL (ref 0.0–1.2)
CO2: 22 mmol/L (ref 20–29)
Calcium: 9.8 mg/dL (ref 8.6–10.2)
Chloride: 102 mmol/L (ref 96–106)
Creatinine, Ser: 0.99 mg/dL (ref 0.76–1.27)
Globulin, Total: 3.1 g/dL (ref 1.5–4.5)
Glucose: 119 mg/dL — ABNORMAL HIGH (ref 70–99)
Potassium: 4.7 mmol/L (ref 3.5–5.2)
Sodium: 142 mmol/L (ref 134–144)
Total Protein: 7.4 g/dL (ref 6.0–8.5)
eGFR: 81 mL/min/{1.73_m2} (ref >59)

## 2021-10-27 LAB — LIPID PANEL
Chol/HDL Ratio: 4.6 ratio (ref 0.0–5.0)
Cholesterol, Total: 192 mg/dL (ref 100–199)
HDL: 42 mg/dL (ref >39)
LDL Chol Calc (NIH): 125 mg/dL — ABNORMAL HIGH (ref 0–99)
Triglycerides: 137 mg/dL (ref 0–149)
VLDL Cholesterol Cal: 25 mg/dL (ref 5–40)

## 2021-10-27 LAB — TSH: TSH: 2.22 u[IU]/mL (ref 0.450–4.500)

## 2021-10-27 LAB — HEMOGLOBIN A1C
Est. average glucose Bld gHb Est-mCnc: 183 mg/dL
Hgb A1c MFr Bld: 8 % — ABNORMAL HIGH (ref 4.8–5.6)

## 2021-10-27 NOTE — Progress Notes (Deleted)
`    ?Name: Connor Becker.  ?Age/ Sex: 71 y.o., male   ?MRN/ DOB: [id number], Mar 05, 1951    ? ?PCP: Glendale Chard, MD   ?Reason for Endocrinology Evaluation: Type 2 Diabetes Mellitus  ?Initial Endocrine Consultative Visit: 04/13/2020  ? ? ?PATIENT IDENTIFIER: [name] is a 71 y.o. male with a past medical history of T2DM,OSA, hypogonadism and dyslipidemia. The patient has followed with Endocrinology clinic since 04/13/2020 for consultative assistance with management of his diabetes. ? ? ? ? ?DIABETIC HISTORY:  ?Connor Becker was diagnosed with DM2 in 2014,Metformin- GI side effect, Farxiga -severe urinary frequency preventing him from sleeping at night.  His hemoglobin A1c has ranged from 6.6%  in 2020, peaking at 7.7% in 2021. ? ?On his initial visit to our clinic his A1c was 7.7%  ,  He was not on any glycemic medications. Started Rybelsus ? ?Rybelsus stopped by 06/2021 due to nausea and vomiting and started pioglitazone ?  ?SUBJECTIVE:  ? ?During the last visit (06/29/2021): A1c 8.5% We stopped Rybelsus ,  continued Glipizide and started pioglitazone ? ? ? ? ?Today (10/27/2021): Connor Becker is here for a follow up on diabetes.  He checks his blood sugars multiple times a day through CGM. The patient has not had hypoglycemic episodes since the last clinic visit. ? ?Had an episode of gastroenteritis requiring ED visit 03/2021 ? ?Rybelsus causes gas and loose stools  ?He has not  been taking Glipizide  ?He was also not taking Crestor until recently when he restarted  ? ? ?HOME DIABETES REGIMEN:  ? ?Glipizide 5 mg twice daily ?Pioglitazone 15 mg daily ? ? ? ? ?Statin: yes ?ACE-I/ARB: yes ? ? ?CONTINUOUS GLUCOSE MONITORING RECORD INTERPRETATION   ? ?Dates of Recording: 11/18-12/15/2022 ? ?Sensor description:freestyle libre  ? ?Results statistics: ?  ?CGM use % of time 92  ?Average and SD 179/19.6  ?Time in range   56  %  ?% Time Above 180 40  ?% Time above 250 4  ?% Time Below target 0   ? ? ? ? ?Glycemic patterns summary: BG's upper limit of goa during the day with hyperglycemia noted after supper  ? ?Hyperglycemic episodes  Post prandial  ? ?Hypoglycemic episodes occurred N.A ? ?Overnight periods: trends down ? ? ? ? ?DIABETIC COMPLICATIONS: ?Microvascular complications:  ? ?Denies: CKD, retinopathy, neuropathy ?Last Eye Exam: Completed 02/2021 ? ?Macrovascular complications:  ? ?Denies: CAD, CVA, PVD ? ? ?HISTORY:  ?Past Medical History:  ?Past Medical History:  ?Diagnosis Date  ? ALLERGIC RHINITIS   ? Chronic pain of both knees   ? DJD (degenerative joint disease) of knee   ? right  ? DM2 (diabetes mellitus, type 2) ([place])   ? GERD   ? Glaucoma   ? History of nuclear stress test   ? Myoview 12/17: EF 53, no ischemia, Low Risk  ? HYPERLIPIDEMIA   ? myalgias from Lipitor  ? HYPOGONADISM, MALE   ? Hypothyroidism   ? INSOMNIA-SLEEP DISORDER-UNSPEC   ? Joint pain   ? Obesity   ? OSTEOARTHRITIS   ? SLEEP APNEA, OBSTRUCTIVE   ? no CPAP; repeat sleep test pending 07/2016  ? Unspecified hypothyroidism 12/04/2013  ? Vitamin D deficiency   ? ?Past Surgical History:  ?Past Surgical History:  ?Procedure Laterality Date  ? NASAL SEPTUM SURGERY    ? TONSILLECTOMY    ? UVULOPALATOPHARYNGOPLASTY    ? VASECTOMY    ? ?Social History:  reports that  he has never smoked. He has never used smokeless tobacco. He reports that he does not currently use alcohol. He reports that he does not use drugs. ?Family History:  ?Family History  ?Problem Relation Age of Onset  ? Heart attack Mother 17  ?     s/p MI  ? Other Mother   ?     pacemaker  ? Diabetes Mother   ? High blood pressure Mother   ? High Cholesterol Mother   ? Thyroid disease Mother   ? Obesity Mother   ? Macular degeneration Father   ? Thyroid disease Father   ? Diabetes Maternal Uncle   ? Heart attack Maternal Uncle   ? Hypertension Other   ? Prostate cancer Other   ? Heart disease Other   ? Colon cancer Neg Hx   ? ? ? ?HOME MEDICATIONS: ?Allergies as of 10/27/2021    ? ?   Reactions  ? Horseradish [armoracia Rusticana Ext (horseradish)]   ? Lipitor [atorvastatin] Other (See Comments)  ? Leg cramps  ? Metformin And Related Other (See Comments)  ? GI upset  ? Semaglutide(0.25 Or 0.'5mg'$ -dos) Nausea And Vomiting  ? Soy Allergy Other (See Comments)  ? Pt stated (SOY SAUCE) causes angioedema, but has added some soy products back in to diet and no angioedema.   ? ?  ? ?  ?Medication List  ?  ? ?  ? Accurate as of October 27, 2021  7:32 AM. If you have any questions, ask your nurse or doctor.  ?  ?  ? ?  ? ?azelastine 0.1 % nasal spray ?Commonly known as: ASTELIN ?Place 2 sprays into both nostrils 2 (two) times daily. Use in each nostril as directed ?  ?dicyclomine 20 MG tablet ?Commonly known as: BENTYL ?Take 1 tablet (20 mg total) by mouth 2 (two) times daily. ?  ?EPINEPHrine 0.3 mg/0.3 mL Soaj injection ?Commonly known as: EPI-PEN ?Inject 0.3 mg into the muscle as needed for anaphylaxis. ?  ?fluticasone 50 MCG/ACT nasal spray ?Commonly known as: FLONASE ?Place 2 sprays into both nostrils daily for 14 days. ?  ?FreeStyle Libre 2 Reader Devi ?1 Device by Does not apply route as directed. ?  ?FreeStyle Libre 2 Sensor Misc ?USE  AS DIRECTED EVERY TWO WEEKS ?  ?glipiZIDE 5 MG tablet ?Commonly known as: GLUCOTROL ?Take 1 tablet (5 mg total) by mouth 2 (two) times daily before a meal. ?  ?HYDROcodone-acetaminophen 5-325 MG tablet ?Commonly known as: NORCO/VICODIN ?Take 2 tablets by mouth every 4 (four) hours as needed. ?  ?latanoprost 0.005 % ophthalmic solution ?Commonly known as: XALATAN ?Place 1 drop into both eyes daily. ?  ?levothyroxine 112 MCG tablet ?Commonly known as: SYNTHROID ?Take 1 tablet by mouth once daily ?  ?lisinopril 5 MG tablet ?Commonly known as: ZESTRIL ?Take 5 mg by mouth daily. Called in 90 days refill to [facility] ?  ?loperamide 2 MG capsule ?Commonly known as: IMODIUM ?Take 1 capsule (2 mg total) by mouth 4 (four) times daily as needed for diarrhea or loose  stools. ?  ?montelukast 10 MG tablet ?Commonly known as: SINGULAIR ?Take 1 tablet (10 mg total) by mouth daily. ?  ?omeprazole 40 MG capsule ?Commonly known as: PRILOSEC ?Take 1 capsule (40 mg total) by mouth daily. ?  ?ondansetron 4 MG disintegrating tablet ?Commonly known as: Zofran ODT ?Take 1 tablet (4 mg total) by mouth every 8 (eight) hours as needed for nausea or vomiting. ?  ?OneTouch Verio test  strip ?Generic drug: glucose blood ?USE STRIP TO CHECK BLOOD SUGAR UP TO 4 TIMES PER DAY ?  ?Ozempic (0.25 or 0.5 MG/DOSE) 2 MG/1.5ML Sopn ?Generic drug: Semaglutide(0.25 or 0.'5MG'$ /DOS) ?Inject 0.5 mg into the skin once a week. ?  ?pioglitazone 15 MG tablet ?Commonly known as: Actos ?Take 1 tablet (15 mg total) by mouth daily. ?  ?promethazine 12.5 MG tablet ?Commonly known as: PHENERGAN ?Take 1 tablet (12.5 mg total) by mouth every 6 (six) hours as needed for nausea or vomiting. ?  ?pseudoephedrine 30 MG tablet ?Commonly known as: SUDAFED ?Take 60 mg by mouth 2 (two) times daily as needed for congestion. ?  ?rosuvastatin 20 MG tablet ?Commonly known as: Crestor ?Take 1 tablet (20 mg total) by mouth daily. ?  ?tadalafil 5 MG tablet ?Commonly known as: CIALIS ?Take 5 mg by mouth daily. ?  ?testosterone enanthate 200 MG/ML injection ?Commonly known as: DELATESTRYL ?Inject 200 mg into the muscle every 14 (fourteen) days. ?  ?timolol 0.5 % ophthalmic solution ?Commonly known as: TIMOPTIC ?Place 1 drop into both eyes daily. ?  ?Turmeric 500 MG Caps ?Take 500 mg by mouth daily. ?  ?vitamin C 500 MG tablet ?Commonly known as: ASCORBIC ACID ?Take 1,000 mg by mouth daily. ?  ?Vitamin D (Ergocalciferol) 1.25 MG (50000 UNIT) Caps capsule ?Commonly known as: DRISDOL ?Take 1 capsule (50,000 Units total) by mouth See admin instructions. Take one capsule by mouth on Tues/Fridays ?  ? ?  ? ? ? ?OBJECTIVE:  ? ?Vital Signs: There were no vitals taken for this visit. ? ?Wt Readings from Last 3 Encounters:  ?10/26/21 291 lb 9.6 oz (132.3  kg)  ?08/22/21 295 lb (133.8 kg)  ?06/29/21 293 lb 3.2 oz (133 kg)  ? ? ? ?Exam: ?General: Pt appears well and is in NAD  ?Lungs: Clear with good BS bilat with no rales, rhonchi, or wheezes  ?Heart: RRR with normal S

## 2021-10-31 ENCOUNTER — Ambulatory Visit: Payer: Medicare Other | Admitting: Internal Medicine

## 2021-10-31 ENCOUNTER — Encounter: Payer: Self-pay | Admitting: Internal Medicine

## 2021-10-31 VITALS — BP 124/80 | HR 70 | Ht 71.0 in | Wt 288.8 lb

## 2021-10-31 DIAGNOSIS — E1165 Type 2 diabetes mellitus with hyperglycemia: Secondary | ICD-10-CM | POA: Diagnosis not present

## 2021-10-31 DIAGNOSIS — E782 Mixed hyperlipidemia: Secondary | ICD-10-CM

## 2021-10-31 MED ORDER — GLIPIZIDE 5 MG PO TABS: 5.0000 mg | ORAL_TABLET | Freq: Every day | ORAL | 3 refills | Status: DC

## 2021-10-31 MED ORDER — OZEMPIC (0.25 OR 0.5 MG/DOSE) 2 MG/1.5ML ~~LOC~~ SOPN: 0.5000 mg | PEN_INJECTOR | SUBCUTANEOUS | 3 refills | Status: DC

## 2021-10-31 NOTE — Progress Notes (Signed)
`    ?Name: Connor Becker.  ?Age/ Sex: 71 y.o., male   ?MRN/ DOB: [id number], Aug 06, 1950    ? ?PCP: Glendale Chard, MD   ?Reason for Endocrinology Evaluation: Type 2 Diabetes Mellitus  ?Initial Endocrine Consultative Visit: 04/13/2020  ? ? ?PATIENT IDENTIFIER: [name] is a 71 y.o. male with a past medical history of T2DM,OSA, hypogonadism and dyslipidemia. The patient has followed with Endocrinology clinic since 04/13/2020 for consultative assistance with management of his diabetes. ? ? ? ? ?DIABETIC HISTORY:  ?[name] was diagnosed with DM2 in 2014,Metformin- GI side effect, Farxiga -severe urinary frequency preventing him from sleeping at night.  His hemoglobin A1c has ranged from 6.6%  in 2020, peaking at 7.7% in 2021. ? ?On his initial visit to our clinic his A1c was 7.7%  ,  He was not on any glycemic medications. Started Rybelsus ? ?Rybelsus stopped by 06/2021 due to nausea and vomiting and started pioglitazone but he did not start it ? ?By 10/2021 he started using his wife's Ozempic and tolerated well  ?  ?SUBJECTIVE:  ? ?During the last visit (06/29/2021): A1c 8.5% We stopped Rybelsus ,  continued Glipizide and started pioglitazone ? ? ? ? ?Today (10/31/2021): [name] is here for a follow up on diabetes.  He checks his blood sugars multiple times a day through CGM. The patient has not had hypoglycemic episodes since the last clinic visit. ? ?For the past 4 weeks he has been using his wife's Ozempic and would like to continue on it  ? ?He stopped using his rosuvastatin last year when he had an episode of gastroenteritis  ? ?Last Friday had an episode of vomiting and abdominal pain.  ? ?HOME DIABETES REGIMEN:  ?Glipizide 5 mg twice daily- taking it once daily  ?Pioglitazone 15 mg daily- not taking  ?Ozempic 0.5 mg weekly  ? ? ? ? ?Statin: yes ?ACE-I/ARB: yes ? ? ?CONTINUOUS GLUCOSE MONITORING RECORD INTERPRETATION   ? ?Dates of Recording: 3/22-4/18/2023 ? ?Sensor  description:freestyle libre  ? ?Results statistics: ?  ?CGM use % of time 65  ?Average and SD 153/21.5  ?Time in range  84%  ?% Time Above 180 15  ?% Time above 250 1  ?% Time Below target 0  ? ? ? ? ?Glycemic patterns summary: Bg's optimal during the day and night  ? ?Hyperglycemic episodes  Post prandial  ? ?Hypoglycemic episodes occurred N.A ? ?Overnight periods: stable and at goal  ? ? ? ? ?DIABETIC COMPLICATIONS: ?Microvascular complications:  ? ?Denies: CKD, retinopathy, neuropathy ?Last Eye Exam: Completed 02/2021 ? ?Macrovascular complications:  ? ?Denies: CAD, CVA, PVD ? ? ?HISTORY:  ?Past Medical History:  ?Past Medical History:  ?Diagnosis Date  ? ALLERGIC RHINITIS   ? Chronic pain of both knees   ? DJD (degenerative joint disease) of knee   ? right  ? DM2 (diabetes mellitus, type 2) ([place])   ? GERD   ? Glaucoma   ? History of nuclear stress test   ? Myoview 12/17: EF 53, no ischemia, Low Risk  ? HYPERLIPIDEMIA   ? myalgias from Lipitor  ? HYPOGONADISM, MALE   ? Hypothyroidism   ? INSOMNIA-SLEEP DISORDER-UNSPEC   ? Joint pain   ? Obesity   ? OSTEOARTHRITIS   ? SLEEP APNEA, OBSTRUCTIVE   ? no CPAP; repeat sleep test pending 07/2016  ? Unspecified hypothyroidism 12/04/2013  ? Vitamin D deficiency   ? ?Past Surgical History:  ?Past  Surgical History:  ?Procedure Laterality Date  ? NASAL SEPTUM SURGERY    ? TONSILLECTOMY    ? UVULOPALATOPHARYNGOPLASTY    ? VASECTOMY    ? ?Social History:  reports that he has never smoked. He has never used smokeless tobacco. He reports that he does not currently use alcohol. He reports that he does not use drugs. ?Family History:  ?Family History  ?Problem Relation Age of Onset  ? Heart attack Mother 40  ?     s/p MI  ? Other Mother   ?     pacemaker  ? Diabetes Mother   ? High blood pressure Mother   ? High Cholesterol Mother   ? Thyroid disease Mother   ? Obesity Mother   ? Macular degeneration Father   ? Thyroid disease Father   ? Diabetes Maternal Uncle   ? Heart attack  Maternal Uncle   ? Hypertension Other   ? Prostate cancer Other   ? Heart disease Other   ? Colon cancer Neg Hx   ? ? ? ?HOME MEDICATIONS: ?Allergies as of 10/31/2021   ? ?   Reactions  ? Horseradish [armoracia Rusticana Ext (horseradish)]   ? Lipitor [atorvastatin] Other (See Comments)  ? Leg cramps  ? Metformin And Related Other (See Comments)  ? GI upset  ? Semaglutide(0.25 Or 0.'5mg'$ -dos) Nausea And Vomiting  ? Soy Allergy Other (See Comments)  ? Pt stated (SOY SAUCE) causes angioedema, but has added some soy products back in to diet and no angioedema.   ? ?  ? ?  ?Medication List  ?  ? ?  ? Accurate as of October 31, 2021 11:31 AM. If you have any questions, ask your nurse or doctor.  ?  ?  ? ?  ? ?STOP taking these medications   ? ?pioglitazone 15 MG tablet ?Commonly known as: Actos ?Stopped by: Dorita Sciara, MD ?  ? ?  ? ?TAKE these medications   ? ?azelastine 0.1 % nasal spray ?Commonly known as: ASTELIN ?Place 2 sprays into both nostrils 2 (two) times daily. Use in each nostril as directed ?  ?dicyclomine 20 MG tablet ?Commonly known as: BENTYL ?Take 1 tablet (20 mg total) by mouth 2 (two) times daily. ?  ?EPINEPHrine 0.3 mg/0.3 mL Soaj injection ?Commonly known as: EPI-PEN ?Inject 0.3 mg into the muscle as needed for anaphylaxis. ?  ?fluticasone 50 MCG/ACT nasal spray ?Commonly known as: FLONASE ?Place 2 sprays into both nostrils daily for 14 days. ?  ?FreeStyle Libre 2 Reader Devi ?1 Device by Does not apply route as directed. ?  ?FreeStyle Libre 2 Sensor Misc ?USE  AS DIRECTED EVERY TWO WEEKS ?  ?glipiZIDE 5 MG tablet ?Commonly known as: GLUCOTROL ?Take 1 tablet (5 mg total) by mouth daily before breakfast. ?What changed: when to take this ?Changed by: Dorita Sciara, MD ?  ?HYDROcodone-acetaminophen 5-325 MG tablet ?Commonly known as: NORCO/VICODIN ?Take 2 tablets by mouth every 4 (four) hours as needed. ?  ?latanoprost 0.005 % ophthalmic solution ?Commonly known as: XALATAN ?Place 1 drop into  both eyes daily. ?  ?levothyroxine 112 MCG tablet ?Commonly known as: SYNTHROID ?Take 1 tablet by mouth once daily ?  ?lisinopril 5 MG tablet ?Commonly known as: ZESTRIL ?Take 5 mg by mouth daily. Called in 90 days refill to [facility] ?  ?loperamide 2 MG capsule ?Commonly known as: IMODIUM ?Take 1 capsule (2 mg total) by mouth 4 (four) times daily as needed for diarrhea or  loose stools. ?  ?montelukast 10 MG tablet ?Commonly known as: SINGULAIR ?Take 1 tablet (10 mg total) by mouth daily. ?  ?omeprazole 40 MG capsule ?Commonly known as: PRILOSEC ?Take 1 capsule (40 mg total) by mouth daily. ?  ?ondansetron 4 MG disintegrating tablet ?Commonly known as: Zofran ODT ?Take 1 tablet (4 mg total) by mouth every 8 (eight) hours as needed for nausea or vomiting. ?  ?OneTouch Verio test strip ?Generic drug: glucose blood ?USE STRIP TO CHECK BLOOD SUGAR UP TO 4 TIMES PER DAY ?  ?Ozempic (0.25 or 0.5 MG/DOSE) 2 MG/1.5ML Sopn ?Generic drug: Semaglutide(0.25 or 0.'5MG'$ /DOS) ?Inject 0.5 mg into the skin once a week. ?  ?promethazine 12.5 MG tablet ?Commonly known as: PHENERGAN ?Take 1 tablet (12.5 mg total) by mouth every 6 (six) hours as needed for nausea or vomiting. ?  ?pseudoephedrine 30 MG tablet ?Commonly known as: SUDAFED ?Take 60 mg by mouth 2 (two) times daily as needed for congestion. ?  ?rosuvastatin 20 MG tablet ?Commonly known as: Crestor ?Take 1 tablet (20 mg total) by mouth daily. ?  ?tadalafil 5 MG tablet ?Commonly known as: CIALIS ?Take 5 mg by mouth daily. ?  ?testosterone enanthate 200 MG/ML injection ?Commonly known as: DELATESTRYL ?Inject 200 mg into the muscle every 14 (fourteen) days. ?  ?timolol 0.5 % ophthalmic solution ?Commonly known as: TIMOPTIC ?Place 1 drop into both eyes daily. ?  ?Turmeric 500 MG Caps ?Take 500 mg by mouth daily. ?  ?vitamin C 500 MG tablet ?Commonly known as: ASCORBIC ACID ?Take 1,000 mg by mouth daily. ?  ?Vitamin D (Ergocalciferol) 1.25 MG (50000 UNIT) Caps capsule ?Commonly  known as: DRISDOL ?Take 1 capsule (50,000 Units total) by mouth See admin instructions. Take one capsule by mouth on Tues/Fridays ?  ? ?  ? ? ? ?OBJECTIVE:  ? ?Vital Signs: BP 124/80 (BP Location: Left Arm, Patient Position:

## 2021-10-31 NOTE — Patient Instructions (Signed)
-   Continue Glipizide 5 mg 1 tablet before Breakfast  ?- Continue Ozempic 0.5 mg weekly  ?- STOP Actos ( pioglitazone ) ? ? ? ? ?HOW TO TREAT LOW BLOOD SUGARS (Blood sugar LESS THAN 70 MG/DL) ?Please follow the RULE OF 15 for the treatment of hypoglycemia treatment (when your (blood sugars are less than 70 mg/dL)  ? ?STEP 1: Take 15 grams of carbohydrates when your blood sugar is low, which includes:  ?3-4 GLUCOSE TABS  OR ?3-4 OZ OF JUICE OR REGULAR SODA OR ?ONE TUBE OF GLUCOSE GEL   ? ?STEP 2: RECHECK blood sugar in 15 MINUTES ?STEP 3: If your blood sugar is still low at the 15 minute recheck --> then, go back to STEP 1 and treat AGAIN with another 15 grams of carbohydrates. ? ?

## 2021-11-01 ENCOUNTER — Ambulatory Visit (INDEPENDENT_AMBULATORY_CARE_PROVIDER_SITE_OTHER): Payer: Medicare Other

## 2021-11-01 VITALS — BP 120/78 | HR 97 | Temp 98.0°F | Ht 71.6 in | Wt 287.6 lb

## 2021-11-01 DIAGNOSIS — Z0000 Encounter for general adult medical examination without abnormal findings: Secondary | ICD-10-CM | POA: Diagnosis not present

## 2021-11-01 NOTE — Progress Notes (Signed)
?This visit occurred during the SARS-CoV-2 public health emergency.  Safety protocols were in place, including screening questions prior to the visit, additional usage of staff PPE, and extensive cleaning of exam room while observing appropriate contact time as indicated for disinfecting solutions. ? ?Subjective:  ? Connor Mark. is a 71 y.o. male who presents for Medicare Annual/Subsequent preventive examination. ? ?Review of Systems    ? ?Cardiac Risk Factors include: advanced age (>32mn, >>37women);diabetes mellitus;hypertension;obesity (BMI >30kg/m2) ? ?   ?Objective:  ?  ?Today's Vitals  ? 11/01/21 1529  ?BP: 120/78  ?Pulse: 97  ?Temp: 98 ?F (36.7 ?C)  ?TempSrc: Oral  ?SpO2: 97%  ?Weight: 287 lb 9.6 oz (130.5 kg)  ?Height: 5' 11.6" (1.819 m)  ? ?Body mass index is 39.44 kg/m?. ? ? ?  11/01/2021  ?  3:41 PM 03/18/2021  ?  7:50 AM 03/16/2021  ?  5:01 PM 10/19/2020  ?  9:55 AM 09/23/2020  ?  4:28 PM 07/09/2020  ?  9:06 PM 07/01/2020  ?  8:05 PM  ?Advanced Directives  ?Does Patient Have a Medical Advance Directive? No No No No No No No  ?Would patient like information on creating a medical advance directive? No - Patient declined No - Patient declined    No - Patient declined No - Patient declined  ? ? ?Current Medications (verified) ?Outpatient Encounter Medications as of 11/01/2021  ?Medication Sig  ? azelastine (ASTELIN) 0.1 % nasal spray Place 2 sprays into both nostrils 2 (two) times daily. Use in each nostril as directed  ? Continuous Blood Gluc Receiver (FREESTYLE LIBRE 2 READER) DEVI 1 Device by Does not apply route as directed.  ? Continuous Blood Gluc Sensor (FREESTYLE LIBRE 2 SENSOR) MISC USE  AS DIRECTED EVERY TWO WEEKS  ? EPINEPHrine 0.3 mg/0.3 mL IJ SOAJ injection Inject 0.3 mg into the muscle as needed for anaphylaxis.  ? glipiZIDE (GLUCOTROL) 5 MG tablet Take 1 tablet (5 mg total) by mouth daily before breakfast.  ? HYDROcodone-acetaminophen (NORCO/VICODIN) 5-325 MG tablet Take 2 tablets by mouth  every 4 (four) hours as needed.  ? latanoprost (XALATAN) 0.005 % ophthalmic solution Place 1 drop into both eyes daily.  ? levothyroxine (SYNTHROID) 112 MCG tablet Take 1 tablet by mouth once daily  ? lisinopril (ZESTRIL) 5 MG tablet Take 5 mg by mouth daily. [facility] ? loperamide (IMODIUM) 2 MG capsule Take 1 capsule (2 mg total) by mouth 4 (four) times daily as needed for diarrhea or loose stools.  ? omeprazole (PRILOSEC) 40 MG capsule Take 1 capsule (40 mg total) by mouth daily.  ? ondansetron (ZOFRAN ODT) 4 MG disintegrating tablet Take 1 tablet (4 mg total) by mouth every 8 (eight) hours as needed for nausea or vomiting.  ? promethazine (PHENERGAN) 12.5 MG tablet Take 1 tablet (12.5 mg total) by mouth every 6 (six) hours as needed for nausea or vomiting.  ? pseudoephedrine (SUDAFED) 30 MG tablet Take 60 mg by mouth 2 (two) times daily as needed for congestion.  ? rosuvastatin (CRESTOR) 20 MG tablet Take 1 tablet (20 mg total) by mouth daily.  ? Semaglutide,0.25 or 0.'5MG'$ /DOS, (OZEMPIC, 0.25 OR 0.5 MG/DOSE,) 2 MG/1.5ML SOPN Inject 0.5 mg into the skin once a week.  ? tadalafil (CIALIS) 5 MG tablet Take 5 mg by mouth daily.  ? testosterone enanthate (DELATESTRYL) 200 MG/ML injection Inject 200 mg into the muscle every 14 (fourteen) days.  ? timolol (  TIMOPTIC) 0.5 % ophthalmic solution Place 1 drop into both eyes daily.  ? Turmeric 500 MG CAPS Take 500 mg by mouth daily.  ? vitamin C (ASCORBIC ACID) 500 MG tablet Take 1,000 mg by mouth daily.  ? Vitamin D, Ergocalciferol, (DRISDOL) 1.25 MG (50000 UNIT) CAPS capsule Take 1 capsule (50,000 Units total) by mouth See admin instructions. Take one capsule by mouth on Tues/Fridays  ? dicyclomine (BENTYL) 20 MG tablet Take 1 tablet (20 mg total) by mouth 2 (two) times daily. (Patient not taking: Reported on 10/26/2021)  ? fluticasone (FLONASE) 50 MCG/ACT nasal spray Place 2 sprays into both nostrils daily for 14 days.  ? montelukast  (SINGULAIR) 10 MG tablet Take 1 tablet (10 mg total) by mouth daily. (Patient not taking: Reported on 11/01/2021)  ? ONETOUCH VERIO test strip USE STRIP TO CHECK BLOOD SUGAR UP TO 4 TIMES PER DAY  ? ?No facility-administered encounter medications on file as of 11/01/2021.  ? ? ?Allergies (verified) ?Horseradish [armoracia rusticana ext (horseradish)], Lipitor [atorvastatin], Metformin and related, Semaglutide(0.25 or 0.'5mg'$ -dos), and Soy allergy  ? ?History: ?Past Medical History:  ?Diagnosis Date  ? ALLERGIC RHINITIS   ? Chronic pain of both knees   ? DJD (degenerative joint disease) of knee   ? right  ? DM2 (diabetes mellitus, type 2) ([place])   ? GERD   ? Glaucoma   ? History of nuclear stress test   ? Myoview 12/17: EF 53, no ischemia, Low Risk  ? HYPERLIPIDEMIA   ? myalgias from Lipitor  ? HYPOGONADISM, MALE   ? Hypothyroidism   ? INSOMNIA-SLEEP DISORDER-UNSPEC   ? Joint pain   ? Obesity   ? OSTEOARTHRITIS   ? SLEEP APNEA, OBSTRUCTIVE   ? no CPAP; repeat sleep test pending 07/2016  ? Unspecified hypothyroidism 12/04/2013  ? Vitamin D deficiency   ? ?Past Surgical History:  ?Procedure Laterality Date  ? NASAL SEPTUM SURGERY    ? TONSILLECTOMY    ? UVULOPALATOPHARYNGOPLASTY    ? VASECTOMY    ? ?Family History  ?Problem Relation Age of Onset  ? Heart attack Mother 66  ?     s/p MI  ? Other Mother   ?     pacemaker  ? Diabetes Mother   ? High blood pressure Mother   ? High Cholesterol Mother   ? Thyroid disease Mother   ? Obesity Mother   ? Macular degeneration Father   ? Thyroid disease Father   ? Diabetes Maternal Uncle   ? Heart attack Maternal Uncle   ? Hypertension Other   ? Prostate cancer Other   ? Heart disease Other   ? Colon cancer Neg Hx   ? ?Social History  ? ?Socioeconomic History  ? Marital status: Married  ?  Spouse name: Takuya Lariccia  ? Number of children: 2  ? Years of education: Not on file  ? Highest education level: Not on file  ?Occupational History  ? Occupation: retired  ?Tobacco Use  ? Smoking  status: Never  ? Smokeless tobacco: Never  ?Vaping Use  ? Vaping Use: Never used  ?Substance and Sexual Activity  ? Alcohol use: Not Currently  ?  Comment: occassional beer  ? Drug use: No  ? Sexual activity: Yes  ?Other Topics Concern  ? Not on file  ?Social History Narrative  ? Married   ? 2 children  ? Semi-retired  ? Architect; gen Chief Strategy Officer  ? Previous Financial trader at [employer]  ? In [place]  22 years.  ? ?Social Determinants of Health  ? ?Financial Resource Strain: Low Risk   ? Difficulty of Paying Living Expenses: Not hard at all  ?Food Insecurity: No Food Insecurity  ? Worried About Charity fundraiser in the Last Year: Never true  ? Ran Out of Food in the Last Year: Never true  ?Transportation Needs: No Transportation Needs  ? Lack of Transportation (Medical): No  ? Lack of Transportation (Non-Medical): No  ?Physical Activity: Inactive  ? Days of Exercise per Week: 0 days  ? Minutes of Exercise per Session: 0 min  ?Stress: No Stress Concern Present  ? Feeling of Stress : Not at all  ?Social Connections: Not on file  ? ? ?Tobacco Counseling ?Counseling given: Not Answered ? ? ?Clinical Intake: ? ?Pre-visit preparation completed: Yes ? ?Pain : No/denies pain ? ?  ? ?Nutritional Status: BMI > 30  Obese ?Nutritional Risks: Nausea/ vomitting/ diarrhea (due to ozempic) ?Diabetes: Yes ? ?How often do you need to have someone help you when you read instructions, pamphlets, or other written materials from your doctor or pharmacy?: 1 - Never ?What is the last grade level you completed in school?: college ? ?Diabetic? Yes ?Nutrition Risk Assessment: ? ?Has the patient had any N/V/D within the last 2 months?  Yes  ?Does the patient have any non-healing wounds?  No  ?Has the patient had any unintentional weight loss or weight gain?  No  ? ?Diabetes: ? ?Is the patient diabetic?  Yes  ?If diabetic, was a CBG obtained today?  No  ?Did the patient bring in their glucometer from home?  No  ?How often do you monitor  your CBG's? regularly.  ? ?Financial Strains and Diabetes Management: ? ?Are you having any financial strains with the device, your supplies or your medication? No .  ?Does the patient want to be seen by [facility]

## 2021-11-01 NOTE — Patient Instructions (Signed)
Connor Becker , ?Thank you for taking time to come for your Medicare Wellness Visit. I appreciate your ongoing commitment to your health goals. Please review the following plan we discussed and let me know if I can assist you in the future.  ? ?Screening recommendations/referrals: ?Colonoscopy: completed 04/2021 ?Recommended yearly ophthalmology/optometry visit for glaucoma screening and checkup ?Recommended yearly dental visit for hygiene and checkup ? ?Vaccinations: ?Influenza vaccine: decline ?Pneumococcal vaccine: decline ?Tdap vaccine: decline ?Shingles vaccine: decline   ?Covid-19: decline ? ?Advanced directives: Advance directive discussed with you today. Even though you declined this today please call our office should you change your mind and we can give you the proper paperwork for you to fill out. ? ?Conditions/risks identified: none ? ?Next appointment: Follow up in one year for your annual wellness visit.  ? ?Preventive Care 29 Years and Older, Male ?Preventive care refers to lifestyle choices and visits with your health care provider that can promote health and wellness. ?What does preventive care include? ?A yearly physical exam. This is also called an annual well check. ?Dental exams once or twice a year. ?Routine eye exams. Ask your health care provider how often you should have your eyes checked. ?Personal lifestyle choices, including: ?Daily care of your teeth and gums. ?Regular physical activity. ?Eating a healthy diet. ?Avoiding tobacco and drug use. ?Limiting alcohol use. ?Practicing safe sex. ?Taking low doses of aspirin every day. ?Taking vitamin and mineral supplements as recommended by your health care provider. ?What happens during an annual well check? ?The services and screenings done by your health care provider during your annual well check will depend on your age, overall health, lifestyle risk factors, and family history of disease. ?Counseling  ?Your health care provider may ask you  questions about your: ?Alcohol use. ?Tobacco use. ?Drug use. ?Emotional well-being. ?Home and relationship well-being. ?Sexual activity. ?Eating habits. ?History of falls. ?Memory and ability to understand (cognition). ?Work and work Statistician. ?Screening  ?You may have the following tests or measurements: ?Height, weight, and BMI. ?Blood pressure. ?Lipid and cholesterol levels. These may be checked every 5 years, or more frequently if you are over 73 years old. ?Skin check. ?Lung cancer screening. You may have this screening every year starting at age 73 if you have a 30-pack-year history of smoking and currently smoke or have quit within the past 15 years. ?Fecal occult blood test (FOBT) of the stool. You may have this test every year starting at age 53. ?Flexible sigmoidoscopy or colonoscopy. You may have a sigmoidoscopy every 5 years or a colonoscopy every 10 years starting at age 73. ?Prostate cancer screening. Recommendations will vary depending on your family history and other risks. ?Hepatitis C blood test. ?Hepatitis B blood test. ?Sexually transmitted disease (STD) testing. ?Diabetes screening. This is done by checking your blood sugar (glucose) after you have not eaten for a while (fasting). You may have this done every 1-3 years. ?Abdominal aortic aneurysm (AAA) screening. You may need this if you are a current or former smoker. ?Osteoporosis. You may be screened starting at age 45 if you are at high risk. ?Talk with your health care provider about your test results, treatment options, and if necessary, the need for more tests. ?Vaccines  ?Your health care provider may recommend certain vaccines, such as: ?Influenza vaccine. This is recommended every year. ?Tetanus, diphtheria, and acellular pertussis (Tdap, Td) vaccine. You may need a Td booster every 10 years. ?Zoster vaccine. You may need this after age 41. ?Pneumococcal  13-valent conjugate (PCV13) vaccine. One dose is recommended after age  67. ?Pneumococcal polysaccharide (PPSV23) vaccine. One dose is recommended after age 6. ?Talk to your health care provider about which screenings and vaccines you need and how often you need them. ?This information is not intended to replace advice given to you by your health care provider. Make sure you discuss any questions you have with your health care provider. ?Document Released: 07/29/2015 Document Revised: 03/21/2016 Document Reviewed: 05/03/2015 ?Elsevier Interactive Patient Education ? 2017 [place]. ? ?Fall Prevention in the Home ?Falls can cause injuries. They can happen to people of all ages. There are many things you can do to make your home safe and to help prevent falls. ?What can I do on the outside of my home? ?Regularly fix the edges of walkways and driveways and fix any cracks. ?Remove anything that might make you trip as you walk through a door, such as a raised step or threshold. ?Trim any bushes or trees on the path to your home. ?Use bright outdoor lighting. ?Clear any walking paths of anything that might make someone trip, such as rocks or tools. ?Regularly check to see if handrails are loose or broken. Make sure that both sides of any steps have handrails. ?Any raised decks and porches should have guardrails on the edges. ?Have any leaves, snow, or ice cleared regularly. ?Use sand or salt on walking paths during winter. ?Clean up any spills in your garage right away. This includes oil or grease spills. ?What can I do in the bathroom? ?Use night lights. ?Install grab bars by the toilet and in the tub and shower. Do not use towel bars as grab bars. ?Use non-skid mats or decals in the tub or shower. ?If you need to sit down in the shower, use a plastic, non-slip stool. ?Keep the floor dry. Clean up any water that spills on the floor as soon as it happens. ?Remove soap buildup in the tub or shower regularly. ?Attach bath mats securely with double-sided non-slip rug tape. ?Do not have throw  rugs and other things on the floor that can make you trip. ?What can I do in the bedroom? ?Use night lights. ?Make sure that you have a light by your bed that is easy to reach. ?Do not use any sheets or blankets that are too big for your bed. They should not hang down onto the floor. ?Have a firm chair that has side arms. You can use this for support while you get dressed. ?Do not have throw rugs and other things on the floor that can make you trip. ?What can I do in the kitchen? ?Clean up any spills right away. ?Avoid walking on wet floors. ?Keep items that you use a lot in easy-to-reach places. ?If you need to reach something above you, use a strong step stool that has a grab bar. ?Keep electrical cords out of the way. ?Do not use floor polish or wax that makes floors slippery. If you must use wax, use non-skid floor wax. ?Do not have throw rugs and other things on the floor that can make you trip. ?What can I do with my stairs? ?Do not leave any items on the stairs. ?Make sure that there are handrails on both sides of the stairs and use them. Fix handrails that are broken or loose. Make sure that handrails are as long as the stairways. ?Check any carpeting to make sure that it is firmly attached to the stairs. Fix any carpet  that is loose or worn. ?Avoid having throw rugs at the top or bottom of the stairs. If you do have throw rugs, attach them to the floor with carpet tape. ?Make sure that you have a light switch at the top of the stairs and the bottom of the stairs. If you do not have them, ask someone to add them for you. ?What else can I do to help prevent falls? ?Wear shoes that: ?Do not have high heels. ?Have rubber bottoms. ?Are comfortable and fit you well. ?Are closed at the toe. Do not wear sandals. ?If you use a stepladder: ?Make sure that it is fully opened. Do not climb a closed stepladder. ?Make sure that both sides of the stepladder are locked into place. ?Ask someone to hold it for you, if  possible. ?Clearly mark and make sure that you can see: ?Any grab bars or handrails. ?First and last steps. ?Where the edge of each step is. ?Use tools that help you move around (mobility aids) if they are needed

## 2021-11-19 ENCOUNTER — Emergency Department (HOSPITAL_COMMUNITY)
Admission: EM | Admit: 2021-11-19 | Discharge: 2021-11-19 | Disposition: A | Discharge status: Home / Self Care | Payer: Medicare Other | Attending: Emergency Medicine | Admitting: Emergency Medicine

## 2021-11-19 DIAGNOSIS — K0889 Other specified disorders of teeth and supporting structures: Secondary | ICD-10-CM

## 2021-11-19 DIAGNOSIS — K029 Dental caries, unspecified: Secondary | ICD-10-CM | POA: Insufficient documentation

## 2021-11-19 DIAGNOSIS — E119 Type 2 diabetes mellitus without complications: Secondary | ICD-10-CM | POA: Diagnosis not present

## 2021-11-19 DIAGNOSIS — Z79899 Other long term (current) drug therapy: Secondary | ICD-10-CM | POA: Diagnosis not present

## 2021-11-19 DIAGNOSIS — E039 Hypothyroidism, unspecified: Secondary | ICD-10-CM | POA: Diagnosis not present

## 2021-11-19 DIAGNOSIS — Z7984 Long term (current) use of oral hypoglycemic drugs: Secondary | ICD-10-CM | POA: Insufficient documentation

## 2021-11-19 MED ORDER — AMOXICILLIN-POT CLAVULANATE 875-125 MG PO TABS
1.0000 | ORAL_TABLET | Freq: Once | ORAL | Status: AC
  Administered 2021-11-19: 1 via ORAL
  Filled 2021-11-19: qty 1

## 2021-11-19 MED ORDER — AMOXICILLIN-POT CLAVULANATE 875-125 MG PO TABS: 1.0000 | ORAL_TABLET | Freq: Two times a day (BID) | ORAL | 0 refills | Status: DC

## 2021-11-19 NOTE — ED Triage Notes (Signed)
Pt states he is have left sided dental pain. States this started 2 weeks ago. ?

## 2021-11-19 NOTE — Discharge Instructions (Addendum)
Please return to the ED with any new symptom such as fevers, nausea, vomiting body aches or chills ?Please follow-up with a dentist.  I have attached a resource guide a dentist in the area to this document. ?Please read the attached informational guide concerning dental caries and dental pain ?Please continue taking ibuprofen for pain relief. ?Please begin taking Augmentin for your suspected tooth infection.  You were provided your first dose here tonight in the department. ?

## 2021-11-19 NOTE — ED Provider Notes (Signed)
?[facility] DEPT ?Provider Note ? ? ?CSN: [id number] ?Arrival date & time: 11/19/21  1717 ? ?  ? ?History ? ?Chief Complaint  ?Patient presents with  ? Dental Pain  ? ? ?Connor Becker. is a 71 y.o. male with medical history significant for obesity, hypothyroidism, glaucoma, GERD, diabetes.  Patient presents ED for evaluation of dental pain.  Patient states that for the last 2 weeks he has had left-sided lower dental pain.  Patient states that he called dentist to be seen but was not able to get an appointment to be seen until June 7.  Patient states at home he has increased dental pain over the last 4 days which caused him to present to ED for evaluation.  Patient denies any fevers, body aches or chills, nausea or vomiting.  Patient denies any trouble swallowing. ? ? ?Dental Pain ?Associated symptoms: no fever   ? ?  ? ?Home Medications ?Prior to Admission medications   ?Medication Sig Start Date End Date Taking? Authorizing Provider  ?amoxicillin-clavulanate (AUGMENTIN) 875-125 MG tablet Take 1 tablet by mouth every 12 (twelve) hours. 11/19/21  Yes Azucena Cecil, PA-C  ?azelastine (ASTELIN) 0.1 % nasal spray Place 2 sprays into both nostrils 2 (two) times daily. Use in each nostril as directed 08/22/21   Glendale Chard, MD  ?Continuous Blood Gluc Receiver (FREESTYLE LIBRE 2 READER) DEVI 1 Device by Does not apply route as directed. 08/15/20   Shamleffer, Melanie Crazier, MD  ?Continuous Blood Gluc Sensor (FREESTYLE LIBRE 2 SENSOR) MISC USE  AS DIRECTED EVERY TWO WEEKS 10/11/21   Shamleffer, Melanie Crazier, MD  ?dicyclomine (BENTYL) 20 MG tablet Take 1 tablet (20 mg total) by mouth 2 (two) times daily. ?Patient not taking: Reported on 10/26/2021 07/10/20   Caccavale, Sophia, PA-C  ?EPINEPHrine 0.3 mg/0.3 mL IJ SOAJ injection Inject 0.3 mg into the muscle as needed for anaphylaxis. 07/14/20   Bary Castilla, NP  ?fluticasone (FLONASE) 50 MCG/ACT nasal spray Place 2 sprays into  both nostrils daily for 14 days. 06/25/21 10/31/21  Leath-Warren, Alda Lea, NP  ?glipiZIDE (GLUCOTROL) 5 MG tablet Take 1 tablet (5 mg total) by mouth daily before breakfast. 10/31/21   Shamleffer, Melanie Crazier, MD  ?HYDROcodone-acetaminophen (NORCO/VICODIN) 5-325 MG tablet Take 2 tablets by mouth every 4 (four) hours as needed. 09/23/20   Volanda Napoleon, PA-C  ?latanoprost (XALATAN) 0.005 % ophthalmic solution Place 1 drop into both eyes daily.    [provider]  ?levothyroxine (SYNTHROID) 112 MCG tablet Take 1 tablet by mouth once daily 08/22/21   Glendale Chard, MD  ?lisinopril (ZESTRIL) 5 MG tablet Take 5 mg by mouth daily. Called in 90 days refill to [employer], Historical, MD  ?loperamide (IMODIUM) 2 MG capsule Take 1 capsule (2 mg total) by mouth 4 (four) times daily as needed for diarrhea or loose stools. 07/10/20   Caccavale, Sophia, PA-C  ?montelukast (SINGULAIR) 10 MG tablet Take 1 tablet (10 mg total) by mouth daily. ?Patient not taking: Reported on 11/01/2021 08/22/21 08/22/22  Glendale Chard, MD  ?omeprazole (PRILOSEC) 40 MG capsule Take 1 capsule (40 mg total) by mouth daily. 05/03/21   Ladene Artist, MD  ?ondansetron (ZOFRAN ODT) 4 MG disintegrating tablet Take 1 tablet (4 mg total) by mouth every 8 (eight) hours as needed for nausea or vomiting. 03/18/21   Sherrill Raring, PA-C  ?ONETOUCH VERIO test strip USE STRIP TO CHECK BLOOD SUGAR UP TO 4 TIMES PER DAY  06/04/18   Glendale Chard, MD  ?promethazine (PHENERGAN) 12.5 MG tablet Take 1 tablet (12.5 mg total) by mouth every 6 (six) hours as needed for nausea or vomiting. 09/23/20   Volanda Napoleon, PA-C  ?pseudoephedrine (SUDAFED) 30 MG tablet Take 60 mg by mouth 2 (two) times daily as needed for congestion.    [provider]  ?rosuvastatin (CRESTOR) 20 MG tablet Take 1 tablet (20 mg total) by mouth daily. 03/01/21   Shamleffer, Melanie Crazier, MD  ?Semaglutide,0.25 or 0.'5MG'$ /DOS, (OZEMPIC, 0.25 OR 0.5 MG/DOSE,) 2 MG/1.5ML  SOPN Inject 0.5 mg into the skin once a week. 10/31/21   Shamleffer, Melanie Crazier, MD  ?tadalafil (CIALIS) 5 MG tablet Take 5 mg by mouth daily. 04/21/18   [provider]  ?testosterone enanthate (DELATESTRYL) 200 MG/ML injection Inject 200 mg into the muscle every 14 (fourteen) days.    [provider]  ?timolol (TIMOPTIC) 0.5 % ophthalmic solution Place 1 drop into both eyes daily. 06/11/18   [provider]  ?Turmeric 500 MG CAPS Take 500 mg by mouth daily.    [provider]  ?vitamin C (ASCORBIC ACID) 500 MG tablet Take 1,000 mg by mouth daily.    [provider]  ?Vitamin D, Ergocalciferol, (DRISDOL) 1.25 MG (50000 UNIT) CAPS capsule Take 1 capsule (50,000 Units total) by mouth See admin instructions. Take one capsule by mouth on Tues/Fridays 10/19/20   Glendale Chard, MD  ?   ? ?Allergies    ?Horseradish [armoracia rusticana ext (horseradish)], Lipitor [atorvastatin], Metformin and related, Semaglutide(0.25 or 0.'5mg'$ -dos), and Soy allergy   ? ?Review of Systems   ?Review of Systems  ?Constitutional:  Negative for chills and fever.  ?HENT:  Negative for trouble swallowing.   ?Gastrointestinal:  Negative for nausea and vomiting.  ?All other systems reviewed and are negative. ? ?Physical Exam ?Updated Vital Signs ?BP (!) 147/97 (BP Location: Right Arm)   Pulse 85   Temp 98 ?F (36.7 ?C)   Resp 18   Ht '6\' 1"'$  (1.854 m)   Wt 130.6 kg   SpO2 95%   BMI 38.00 kg/m?  ?Physical Exam ?Vitals and nursing note reviewed.  ?Constitutional:   ?   General: He is not in acute distress. ?   Appearance: Normal appearance. He is not ill-appearing, toxic-appearing or diaphoretic.  ?HENT:  ?   Head: Normocephalic and atraumatic.  ?   Nose: Nose normal. No congestion.  ?   Mouth/Throat:  ?   Mouth: Mucous membranes are moist.  ?   Dentition: Gingival swelling and dental caries present. No dental abscesses.  ?   Comments: Obvious dental carry to second premolar.  No obvious abscess in  need of drainage is appreciated on visual inspection. ?Eyes:  ?   Extraocular Movements: Extraocular movements intact.  ?   Conjunctiva/sclera: Conjunctivae normal.  ?   Pupils: Pupils are equal, round, and reactive to light.  ?Cardiovascular:  ?   Rate and Rhythm: Normal rate and regular rhythm.  ?Pulmonary:  ?   Effort: Pulmonary effort is normal.  ?   Breath sounds: Normal breath sounds. No wheezing.  ?Abdominal:  ?   General: Abdomen is flat. Bowel sounds are normal.  ?   Palpations: Abdomen is soft.  ?   Tenderness: There is no abdominal tenderness.  ?Musculoskeletal:  ?   Cervical back: Normal range of motion and neck supple. No tenderness.  ?Skin: ?   General: Skin is warm and dry.  ?  Capillary Refill: Capillary refill takes less than 2 seconds.  ?Neurological:  ?   Mental Status: He is alert and oriented to person, place, and time.  ? ? ?ED Results / Procedures / Treatments   ?Labs ?(all labs ordered are listed, but only abnormal results are displayed) ?Labs Reviewed - No data to display ? ?EKG ?None ? ?Radiology ?No results found. ? ?Procedures ?Procedures  ? ? ?Medications Ordered in ED ?Medications  ?amoxicillin-clavulanate (AUGMENTIN) 875-125 MG per tablet 1 tablet (1 tablet Oral Given 11/19/21 1945)  ? ? ?ED Course/ Medical Decision Making/ A&P ?  ?                        ?Medical Decision Making ?Risk ?Prescription drug management. ? ? ?72 year old male presents ED for evaluation of dental pain.  Please see HPI for further details. ? ?On examination, patient afebrile, nontachycardic, nonhypoxic with clear lung sounds bilaterally.  Patient abdomen is soft compressible all 4 quadrants.  Patient nontoxic in appearance.  Patient oropharynx inspected, no obvious abscess in need of drainage.  There are dental caries noted the patient second premolar.  Patient airway is intact.  The patient does not have any trismus, hot potato voice, the patient is not drooling.  No sign of RPA, PTA.  [place]  midline. ? ?Patient was provided with his first dose of Augmentin here in the department.  The patient was placed on 10 days of Augmentin therapy twice daily.  Patient was also given a list of dental resources in the area and

## 2021-12-18 ENCOUNTER — Other Ambulatory Visit: Payer: Self-pay | Admitting: Internal Medicine

## 2021-12-18 DIAGNOSIS — E039 Hypothyroidism, unspecified: Secondary | ICD-10-CM

## 2021-12-19 LAB — HM DIABETES EYE EXAM

## 2021-12-27 ENCOUNTER — Encounter: Payer: Self-pay | Admitting: Internal Medicine

## 2021-12-27 ENCOUNTER — Other Ambulatory Visit: Payer: Self-pay | Admitting: Internal Medicine

## 2021-12-27 IMAGING — CT CT ABD-PELV W/ CM
2 of 5 series · 15 of 46 positions shown, 17 images · IV contrast (omnipaque)
Comparison: Abdominopelvic CT 07/10/2020 and 07/01/2020

CLINICAL DATA: Left lower quadrant abdominal pain with diarrhea,
nausea and vomiting for 5 days. No previous relevant surgery.

EXAM:
CT ABDOMEN AND PELVIS WITH CONTRAST
TECHNIQUE: Multidetector CT imaging of the abdomen and pelvis was performed
using the standard protocol following bolus administration of
intravenous contrast.
CONTRAST:  80mL OMNIPAQUE IOHEXOL 350 MG/ML SOLN

[Series 2: axial st · axial · 0.90mm/px · z∈[+931,+1371]mm · 12 of 106 slices shown, 14 images]
[im 9/106  soft-tissue]
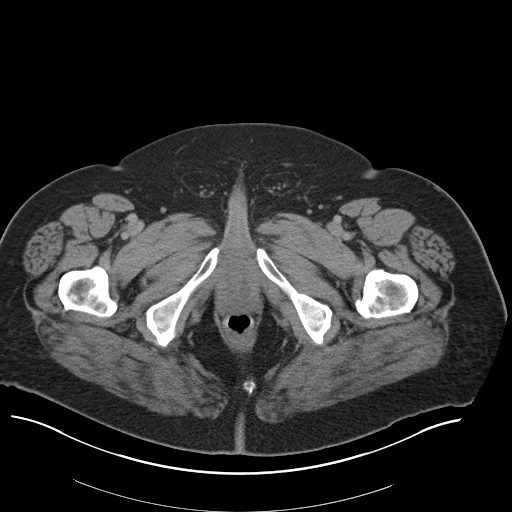
[im 9/106  bone]
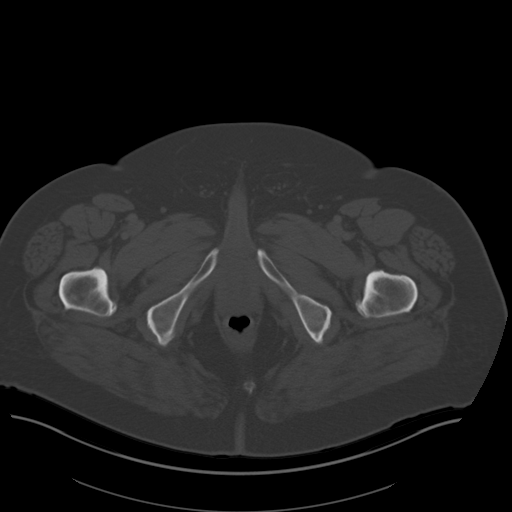
[im 17/106  soft-tissue]
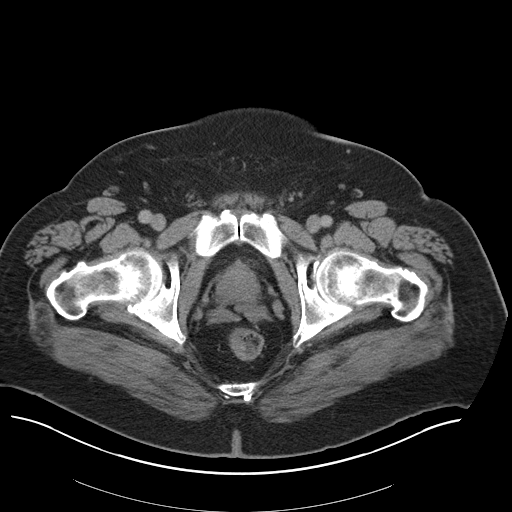
[im 25/106  soft-tissue]
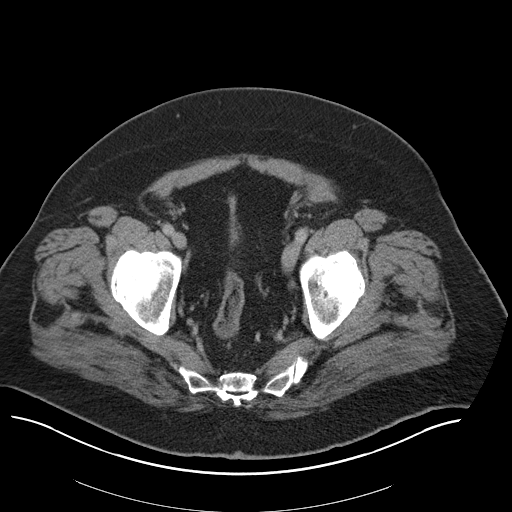
[im 33/106  soft-tissue]
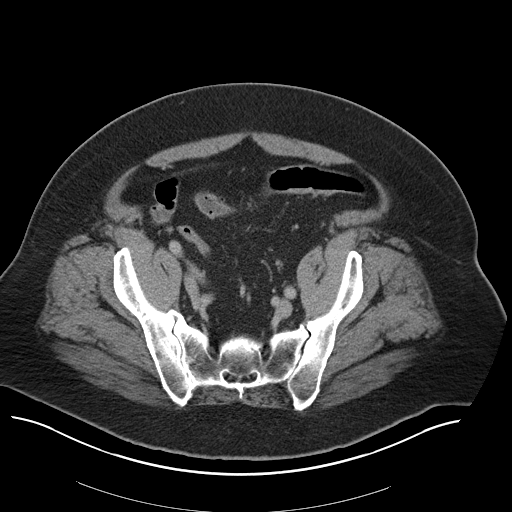
[im 41/106  soft-tissue]
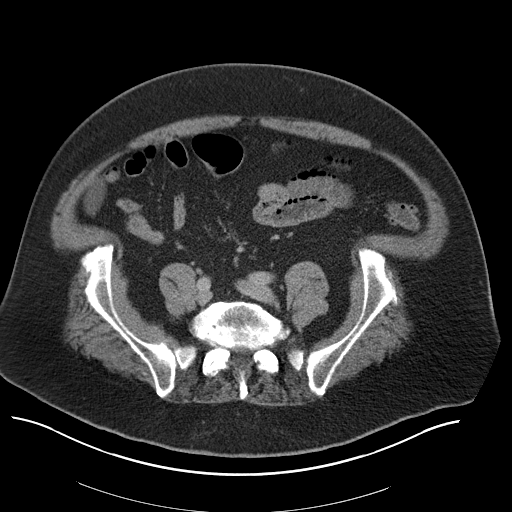
[im 49/106  soft-tissue]
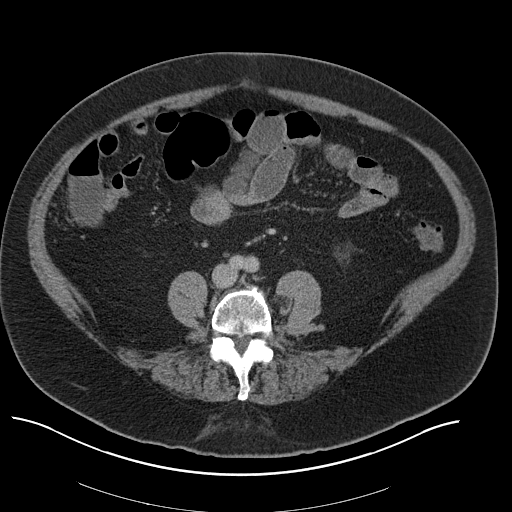
[im 57/106  soft-tissue]
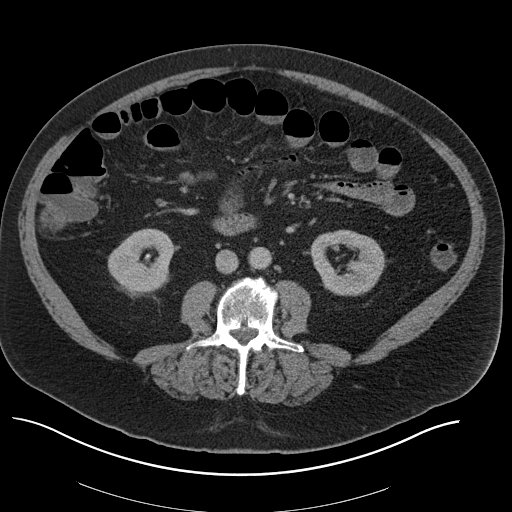
[im 65/106  soft-tissue]
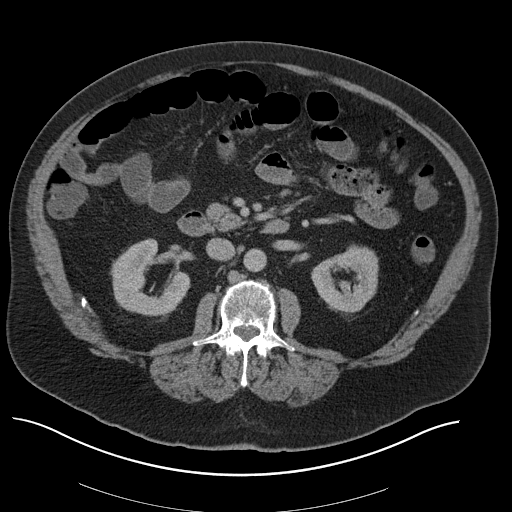
[im 73/106  soft-tissue]
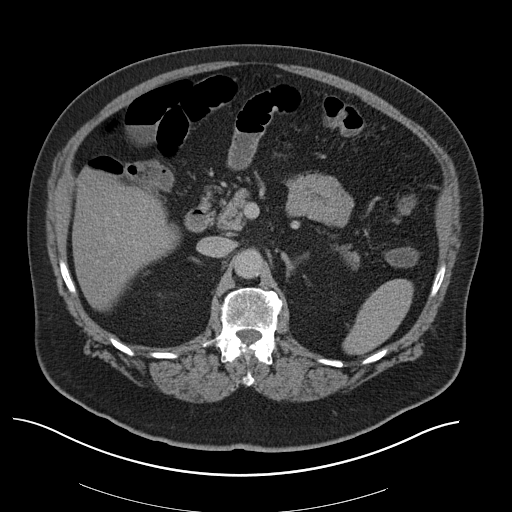
[im 73/106  bone]
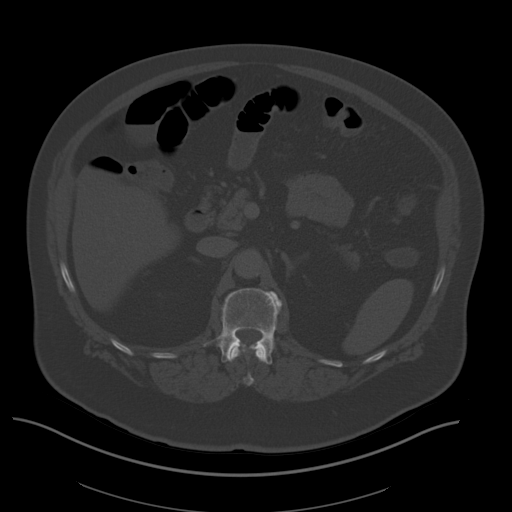
[im 81/106  soft-tissue]
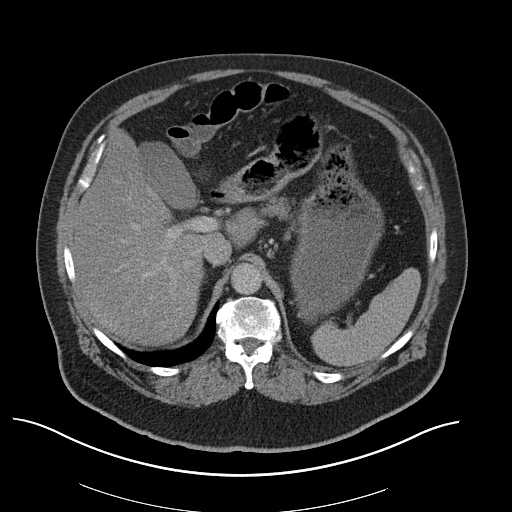
[im 89/106  soft-tissue]
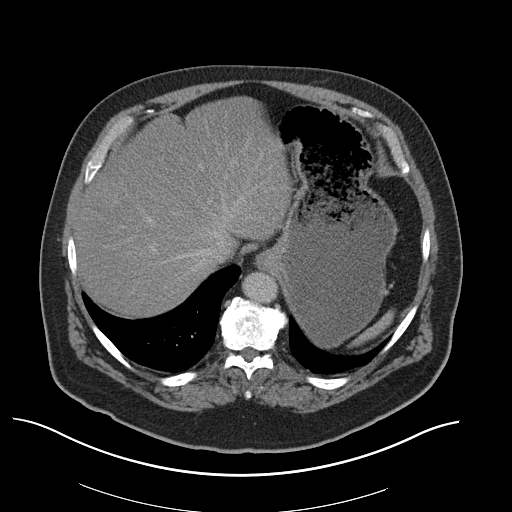
[im 97/106  soft-tissue]
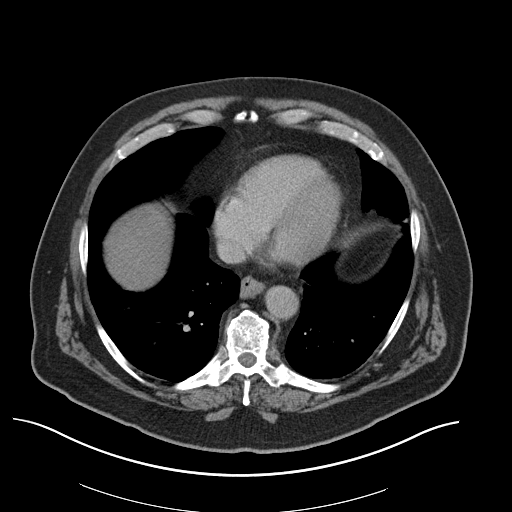

[Series 5: coronal st · coronal · 1.00mm/px · 3 of 185 slices shown]
[im 62/185  soft-tissue]
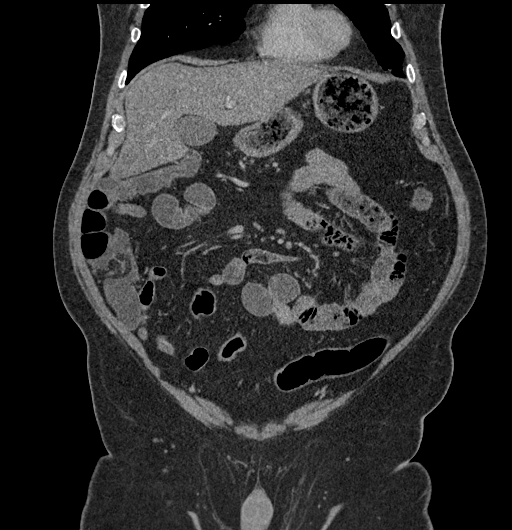
[im 82/185  soft-tissue]
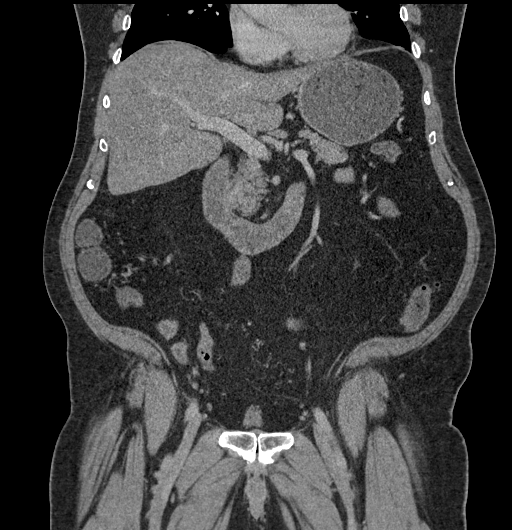
[im 103/185  soft-tissue]
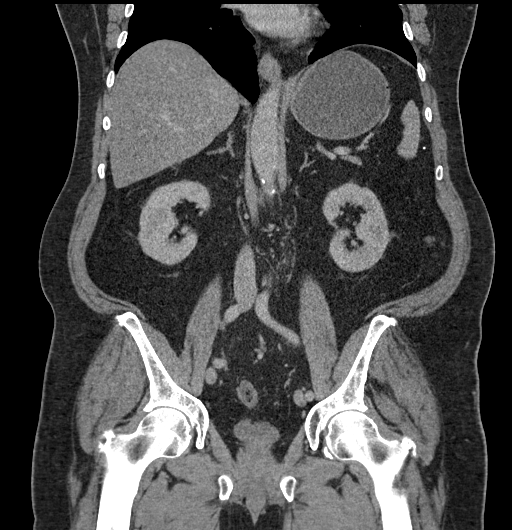

[15 of 46 positions shown; findings below may reference images not displayed]

FINDINGS: Lower chest: 6 mm subpleural nodule in the right middle lobe (image
[DATE]) was not seen previously, although is not definitely new. The
lung bases are otherwise clear. There is no pleural or pericardial
effusion.

Hepatobiliary: Diffuse hepatic steatosis, as before. No focal liver
lesion or abnormal enhancement identified. No evidence of
gallstones, gallbladder wall thickening or biliary dilatation.

Pancreas: Unremarkable. No pancreatic ductal dilatation or
surrounding inflammatory changes.

Spleen: Normal in size without focal abnormality.

Adrenals/Urinary Tract: Both adrenal glands appear normal. Both
kidneys appear normal without calculi, hydronephrosis or perinephric
soft tissue stranding. The bladder is nearly empty and suboptimally
evaluated.

Stomach/Bowel: No enteric contrast administered. The stomach appears
unremarkable for its degree of distention, although is filled with
ingested material. The small bowel is fluid-filled, but not
significantly dilated. No evidence of bowel wall thickening or
surrounding inflammation. Fatty lesion within the lumen of the
proximal transverse colon is unchanged (image [DATE]), consistent with
a benign lipomatous lesion. There are diverticular changes of the
sigmoid colon. The appendix appears normal.

Vascular/Lymphatic: There are no enlarged abdominal or pelvic lymph
nodes. Mild aortic and branch vessel atherosclerosis without acute
vascular findings.

Reproductive: The prostate gland appears stable without focal
abnormality.

Other: No ascites, free air or focal extraluminal fluid collection.

Musculoskeletal: No acute or significant osseous findings.
Multilevel thoracolumbar spondylosis again noted.
IMPRESSION: 1. No acute findings or explanation for the patient's symptoms.
Sigmoid diverticulosis without evidence of acute diverticulitis.
2. Fluid-filled bowel without evidence of obstruction, wall
thickening or perforation.
3. Hepatic steatosis.
4.  Aortic Atherosclerosis ([id number].T).
5. Tiny right middle lobe pulmonary nodule, not seen previously and
perhaps not previously imaged. This appearance is almost certainly
benign, and no dedicated follow-up is required if this patient is
low risk for bronchogenic carcinoma (and has no known or suspected
primary neoplasm). Non-contrast chest CT can be considered in 12
months if patient is high-risk. This recommendation follows the
consensus statement: Guidelines for Management of Incidental
Pulmonary Nodules Detected on CT Images: From the [HOSPITAL]

## 2021-12-28 ENCOUNTER — Ambulatory Visit: Payer: Medicare Other | Admitting: Internal Medicine

## 2022-01-31 ENCOUNTER — Other Ambulatory Visit: Payer: Self-pay | Admitting: Internal Medicine

## 2022-02-08 ENCOUNTER — Other Ambulatory Visit: Payer: Self-pay | Admitting: Internal Medicine

## 2022-02-09 ENCOUNTER — Other Ambulatory Visit: Payer: Self-pay | Admitting: Internal Medicine

## 2022-02-23 ENCOUNTER — Encounter: Payer: Self-pay | Admitting: Pharmacist

## 2022-02-23 DIAGNOSIS — Z9229 Personal history of other drug therapy: Secondary | ICD-10-CM

## 2022-02-23 NOTE — Progress Notes (Signed)
[facility])                                            [place] Team                                        Statin Quality Measure Assessment    02/23/2022  Connor Becker. [birth year] [id number]   Per review of chart and payor information, patient has a diagnosis of diabetes but is not currently filling a statin prescription.  This places patient into the SUPD (Statin Use In Patients with Diabetes) measure for CMS.    Rosuvastatin is on the patient's medication list but has not been filled since 08/22.  Patient has an upcoming follow up appointment 02/27/22.  If deemed therapeutically appropriate, the patient's statin therapy could be assessed at the next visit and a refill sent in or if the patient experienced statin intolerance, a statin exclusion code could be associated with the upcoming visit.  The 10-year ASCVD risk score (Arnett DK, et al., 2019) is: 53.1%   Values used to calculate the score:     Age: 71 years     Sex: Male     Is Non-Hispanic African American: No     Diabetic: Yes     Tobacco smoker: No     Systolic Blood Pressure: 502 mmHg     Is BP treated: Yes     HDL Cholesterol: 42 mg/dL     Total Cholesterol: 192 mg/dL 10/26/2021     Component Value Date/Time   CHOL 192 10/26/2021 1041   TRIG 137 10/26/2021 1041   HDL 42 10/26/2021 1041   CHOLHDL 4.6 10/26/2021 1041   CHOLHDL 5 11/17/2015 1428   VLDL 60.0 (H) 11/17/2015 1428   LDLCALC 125 (H) 10/26/2021 1041   LDLDIRECT 148.0 11/17/2015 1428    Please consider ONE of the following recommendations:  Initiate high intensity statin Atorvastatin '40mg'$  once daily, #90, 3 refills   Rosuvastatin '20mg'$  once daily, #90, 3 refills    Initiate moderate intensity          statin with reduced frequency if prior          statin intolerance 1x weekly, #13, 3 refills   2x weekly, #26, 3 refills   3x weekly, #39, 3 refills    Code for past statin  intolerance or  other exclusions (required annually)   Provider Requirements:  Associate code during an office visit or telehealth encounter  Drug Induced Myopathy G72.0   Myopathy, unspecified G72.9   Myositis, unspecified M60.9   Rhabdomyolysis D74.12   Alcoholic fatty liver I78.6   Cirrhosis of liver K74.69   Prediabetes R73.03   PCOS E28.2   Toxic liver disease, unspecified K71.9         Plan: Route note to provider prior to the upcoming appointment.  Elayne Guerin, PharmD, [place] Clinical Pharmacist [phone]

## 2022-02-27 ENCOUNTER — Ambulatory Visit (INDEPENDENT_AMBULATORY_CARE_PROVIDER_SITE_OTHER): Payer: Medicare Other | Admitting: Internal Medicine

## 2022-02-27 ENCOUNTER — Encounter: Payer: Self-pay | Admitting: Internal Medicine

## 2022-02-27 VITALS — BP 108/80 | HR 59 | Temp 97.9°F | Ht 73.0 in | Wt 276.6 lb

## 2022-02-27 DIAGNOSIS — E1165 Type 2 diabetes mellitus with hyperglycemia: Secondary | ICD-10-CM | POA: Diagnosis not present

## 2022-02-27 DIAGNOSIS — R351 Nocturia: Secondary | ICD-10-CM

## 2022-02-27 DIAGNOSIS — N401 Enlarged prostate with lower urinary tract symptoms: Secondary | ICD-10-CM

## 2022-02-27 DIAGNOSIS — Z6836 Body mass index (BMI) 36.0-36.9, adult: Secondary | ICD-10-CM

## 2022-02-27 DIAGNOSIS — E6609 Other obesity due to excess calories: Secondary | ICD-10-CM

## 2022-02-27 DIAGNOSIS — I700 Atherosclerosis of aorta: Secondary | ICD-10-CM

## 2022-02-27 DIAGNOSIS — E039 Hypothyroidism, unspecified: Secondary | ICD-10-CM | POA: Diagnosis not present

## 2022-02-27 NOTE — Progress Notes (Signed)
I,Connor Becker,acting as a scribe for Connor Greenland, MD.,have documented all relevant documentation on the behalf of Connor Greenland, MD,as directed by  Connor Greenland, MD while in the presence of Connor Greenland, MD.    Subjective:     Patient ID: Connor Becker , male    DOB: [birth year] , 71 y.o.   MRN: [id number]   Chief Complaint  Patient presents with   Diabetes    HPI  Patient presents today for a diabetes check. Patient reports compliance with his medications. He states he is doing much better taking his meds on a regular basis. He is now followed by [facility] for management of his diabetes. He has appt next week, he wants to have labs drawn today. He has no other concerns at this time.   I did inquire regarding his rosuvastatin rx. He admits he was not taking his meds initially; therefore, he had a lot of pills leftover. He is now taking as prescribed and wants to finish his current supply  before picking up a new prescription.   Diabetes He presents for his follow-up diabetic visit. He has type 2 diabetes mellitus. His disease course has been stable. There are no hypoglycemic associated symptoms. Pertinent negatives for diabetes include no polydipsia, no polyphagia, no polyuria and no visual change. There are no hypoglycemic complications. There are no diabetic complications. Risk factors for coronary artery disease include diabetes mellitus, dyslipidemia, male sex, obesity and sedentary lifestyle. He participates in exercise intermittently. An ACE inhibitor/angiotensin II receptor blocker is being taken. Eye exam is not current.  Thyroid Problem Presents for follow-up visit. Patient reports no cold intolerance, leg swelling or visual change. The symptoms have been stable.     Past Medical History:  Diagnosis Date   ALLERGIC RHINITIS    Chronic pain of both knees    DJD (degenerative joint disease) of knee    right   DM2 (diabetes mellitus, type 2) (HCC)     GERD    Glaucoma    History of nuclear stress test    Myoview 12/17: EF 53, no ischemia, Low Risk   HYPERLIPIDEMIA    myalgias from Lipitor   HYPOGONADISM, MALE    Hypothyroidism    INSOMNIA-SLEEP DISORDER-UNSPEC    Joint pain    Obesity    OSTEOARTHRITIS    SLEEP APNEA, OBSTRUCTIVE    no CPAP; repeat sleep test pending 07/2016   Unspecified hypothyroidism 12/04/2013   Vitamin D deficiency      Family History  Problem Relation Age of Onset   Heart attack Mother 73       s/p MI   Other Mother        pacemaker   Diabetes Mother    High blood pressure Mother    High Cholesterol Mother    Thyroid disease Mother    Obesity Mother    Macular degeneration Father    Thyroid disease Father    Diabetes Maternal Uncle    Heart attack Maternal Uncle    Hypertension Other    Prostate cancer Other    Heart disease Other    Colon cancer Neg Hx      Current Outpatient Medications:    azelastine (ASTELIN) 0.1 % nasal spray, Place 2 sprays into both nostrils 2 (two) times daily. Use in each nostril as directed, Disp: 30 mL, Rfl: 2   Continuous Blood Gluc Receiver (FREESTYLE LIBRE 2 READER) DEVI, 1 Device by Does not  apply route as directed., Disp: 1 each, Rfl: 0   Continuous Blood Gluc Sensor (FREESTYLE LIBRE 2 SENSOR) MISC, USE AS DIRECTED EVERY 2 WEEKS, Disp: 2 each, Rfl: 0   EPINEPHrine 0.3 mg/0.3 mL IJ SOAJ injection, Inject 0.3 mg into the muscle as needed for anaphylaxis., Disp: 1 each, Rfl: 0   HYDROcodone-acetaminophen (NORCO/VICODIN) 5-325 MG tablet, Take 2 tablets by mouth every 4 (four) hours as needed., Disp: 6 tablet, Rfl: 0   latanoprost (XALATAN) 0.005 % ophthalmic solution, Place 1 drop into both eyes daily., Disp: , Rfl:    levothyroxine (SYNTHROID) 112 MCG tablet, Take 1 tablet by mouth once daily, Disp: 90 tablet, Rfl: 0   lisinopril (ZESTRIL) 5 MG tablet, Take 1 tablet by mouth once daily, Disp: 90 tablet, Rfl: 0   loperamide (IMODIUM) 2 MG capsule, Take 1 capsule  (2 mg total) by mouth 4 (four) times daily as needed for diarrhea or loose stools., Disp: 12 capsule, Rfl: 0   omeprazole (PRILOSEC) 40 MG capsule, Take 1 capsule (40 mg total) by mouth daily., Disp: 60 capsule, Rfl: 5   ondansetron (ZOFRAN ODT) 4 MG disintegrating tablet, Take 1 tablet (4 mg total) by mouth every 8 (eight) hours as needed for nausea or vomiting., Disp: 20 tablet, Rfl: 0   ONETOUCH VERIO test strip, USE STRIP TO CHECK BLOOD SUGAR UP TO 4 TIMES PER DAY, Disp: 200 each, Rfl: 11   OZEMPIC, 0.25 OR 0.5 MG/DOSE, 2 MG/3ML SOPN, INJECT 0.5 MG INTO THE SKIN ONCE A WEEK, Disp: 3 mL, Rfl: 0   promethazine (PHENERGAN) 12.5 MG tablet, Take 1 tablet (12.5 mg total) by mouth every 6 (six) hours as needed for nausea or vomiting., Disp: 6 tablet, Rfl: 0   pseudoephedrine (SUDAFED) 30 MG tablet, Take 60 mg by mouth 2 (two) times daily as needed for congestion., Disp: , Rfl:    rosuvastatin (CRESTOR) 20 MG tablet, Take 1 tablet (20 mg total) by mouth daily., Disp: 90 tablet, Rfl: 3   tadalafil (CIALIS) 5 MG tablet, Take 5 mg by mouth daily., Disp: , Rfl: 11   timolol (TIMOPTIC) 0.5 % ophthalmic solution, Place 1 drop into both eyes daily., Disp: , Rfl: 2   Turmeric 500 MG CAPS, Take 500 mg by mouth daily., Disp: , Rfl:    vitamin C (ASCORBIC ACID) 500 MG tablet, Take 1,000 mg by mouth daily., Disp: , Rfl:    Vitamin D, Ergocalciferol, (DRISDOL) 1.25 MG (50000 UNIT) CAPS capsule, Take 1 capsule (50,000 Units total) by mouth See admin instructions. Take one capsule by mouth on Tues/Fridays, Disp: 24 capsule, Rfl: 0   amoxicillin-clavulanate (AUGMENTIN) 875-125 MG tablet, Take 1 tablet by mouth every 12 (twelve) hours. (Patient not taking: Reported on 02/27/2022), Disp: 19 tablet, Rfl: 0   dicyclomine (BENTYL) 20 MG tablet, Take 1 tablet (20 mg total) by mouth 2 (two) times daily. (Patient not taking: Reported on 10/26/2021), Disp: 20 tablet, Rfl: 0   fluticasone (FLONASE) 50 MCG/ACT nasal spray, Place 2  sprays into both nostrils daily for 14 days., Disp: 16 g, Rfl: 0   glipiZIDE (GLUCOTROL) 5 MG tablet, Take 1 tablet (5 mg total) by mouth daily before breakfast. (Patient not taking: Reported on 02/27/2022), Disp: 90 tablet, Rfl: 3   montelukast (SINGULAIR) 10 MG tablet, Take 1 tablet (10 mg total) by mouth daily. (Patient not taking: Reported on 11/01/2021), Disp: 30 tablet, Rfl: 2   testosterone enanthate (DELATESTRYL) 200 MG/ML injection, Inject 200 mg into the muscle every  14 (fourteen) days. (Patient not taking: Reported on 02/27/2022), Disp: , Rfl:    Allergies  Allergen Reactions   Horseradish [Armoracia Rusticana Ext (Horseradish)]    Lipitor [Atorvastatin] Other (See Comments)    Leg cramps   Metformin And Related Other (See Comments)    GI upset   Semaglutide(0.25 Or 0.64m-Dos) Nausea And Vomiting   Soy Allergy Other (See Comments)    Pt stated (SOY SAUCE) causes angioedema, but has added some soy products back in to diet and no angioedema.      Review of Systems  Constitutional: Negative.   HENT: Negative.    Respiratory: Negative.    Cardiovascular: Negative.   Gastrointestinal: Negative.   Endocrine: Negative for cold intolerance, polydipsia, polyphagia and polyuria.  Genitourinary: Negative.   Skin: Negative.   Psychiatric/Behavioral: Negative.       Today's Vitals   02/27/22 0945  BP: 108/80  Pulse: (!) 59  Temp: 97.9 F (36.6 C)  SpO2: 96%  Weight: 276 lb 9.6 oz (125.5 kg)  Height: '6\' 1"'  (1.854 m)  PainSc: 0-No pain   Body mass index is 36.49 kg/m.  Wt Readings from Last 3 Encounters:  02/27/22 276 lb 9.6 oz (125.5 kg)  11/19/21 288 lb (130.6 kg)  11/01/21 287 lb 9.6 oz (130.5 kg)    Objective:  Physical Exam Vitals and nursing note reviewed.  Constitutional:      Appearance: Normal appearance.  HENT:     Head: Normocephalic and atraumatic.  Eyes:     Extraocular Movements: Extraocular movements intact.  Cardiovascular:     Rate and Rhythm:  Normal rate and regular rhythm.     Heart sounds: Normal heart sounds.  Pulmonary:     Effort: Pulmonary effort is normal.     Breath sounds: Normal breath sounds.  Musculoskeletal:     Cervical back: Normal range of motion.  Skin:    General: Skin is warm.  Neurological:     General: No focal deficit present.     Mental Status: He is alert.  Psychiatric:        Mood and Affect: Mood normal.         Assessment And Plan:     1. Type 2 diabetes mellitus with hyperglycemia, without long-term current use of insulin (HCC) Comments: Chronic, he is now followed by Endo. I will check a1c/bmp today. BS log on Libre/OT Verio meter reviewed, sugars have improved greatly.  - CMP14+EGFR - Lipid panel - CBC - Hemoglobin A1c  2. Primary hypothyroidism Comments: Last TSH within acceptable limits April 2023.  - CBC  3. Atherosclerosis of aorta ([place] Comments: Chronic, he reports he has improved compliance with rosuvastatin. Has not filled recently because he had "leftover pills". Now taking daily. LDL goal <70.  - Lipid panel  4. BPH associated with nocturia Comments: I will check PSA today and forward results to Dr. EJunious Silk I will also request records from previous visit.  - PSA Total (Reflex To Free)  5. Class 2 obesity due to excess calories without serious comorbidity with body mass index (BMI) of 36.0 to 36.9 in adult Comments: He was congratulated on losing another 12 lbs since May 2023.  He is encouraged to keep up the great work.  Patient was given opportunity to ask questions. Patient verbalized understanding of the plan and was able to repeat key elements of the plan. All questions were answered to their satisfaction.   I, RMaximino Greenland MD, have reviewed all documentation for  this visit. The documentation on 02/27/22 for the exam, diagnosis, procedures, and orders are all accurate and complete.   IF YOU HAVE BEEN REFERRED TO A SPECIALIST, IT MAY TAKE 1-2 WEEKS TO  SCHEDULE/PROCESS THE REFERRAL. IF YOU HAVE NOT HEARD FROM US/SPECIALIST IN TWO WEEKS, PLEASE GIVE [place] A CALL AT [phone] X [phone].   THE PATIENT IS ENCOURAGED TO PRACTICE SOCIAL DISTANCING DUE TO THE COVID-19 PANDEMIC.

## 2022-02-27 NOTE — Patient Instructions (Signed)

## 2022-02-28 LAB — LIPID PANEL
Chol/HDL Ratio: 3.4 ratio (ref 0.0–5.0)
Cholesterol, Total: 144 mg/dL (ref 100–199)
HDL: 42 mg/dL (ref >39)
LDL Chol Calc (NIH): 79 mg/dL (ref 0–99)
Triglycerides: 131 mg/dL (ref 0–149)
VLDL Cholesterol Cal: 23 mg/dL (ref 5–40)

## 2022-02-28 LAB — CMP14+EGFR
ALT: 25 IU/L (ref 0–44)
AST: 23 IU/L (ref 0–40)
Albumin/Globulin Ratio: 1.8 (ref 1.2–2.2)
Albumin: 4.4 g/dL (ref 3.8–4.8)
Alkaline Phosphatase: 71 IU/L (ref 44–121)
BUN/Creatinine Ratio: 13 (ref 10–24)
BUN: 16 mg/dL (ref 8–27)
Bilirubin Total: 0.4 mg/dL (ref 0.0–1.2)
CO2: 21 mmol/L (ref 20–29)
Calcium: 9.6 mg/dL (ref 8.6–10.2)
Chloride: 101 mmol/L (ref 96–106)
Creatinine, Ser: 1.22 mg/dL (ref 0.76–1.27)
Globulin, Total: 2.4 g/dL (ref 1.5–4.5)
Glucose: 101 mg/dL — ABNORMAL HIGH (ref 70–99)
Potassium: 4.8 mmol/L (ref 3.5–5.2)
Sodium: 137 mmol/L (ref 134–144)
Total Protein: 6.8 g/dL (ref 6.0–8.5)
eGFR: 63 mL/min/{1.73_m2} (ref >59)

## 2022-02-28 LAB — CBC
Hematocrit: 46.6 % (ref 37.5–51.0)
Hemoglobin: 15.5 g/dL (ref 13.0–17.7)
MCH: 30.6 pg (ref 26.6–33.0)
MCHC: 33.3 g/dL (ref 31.5–35.7)
MCV: 92 fL (ref 79–97)
Platelets: 251 10*3/uL (ref 150–450)
RBC: 5.06 x10E6/uL (ref 4.14–5.80)
RDW: 12.8 % (ref 11.6–15.4)
WBC: 8.7 10*3/uL (ref 3.4–10.8)

## 2022-02-28 LAB — HEMOGLOBIN A1C
Est. average glucose Bld gHb Est-mCnc: 140 mg/dL
Hgb A1c MFr Bld: 6.5 % — ABNORMAL HIGH (ref 4.8–5.6)

## 2022-02-28 LAB — PSA TOTAL (REFLEX TO FREE): Prostate Specific Ag, Serum: 3.1 ng/mL (ref 0.0–4.0)

## 2022-03-05 ENCOUNTER — Encounter: Payer: Self-pay | Admitting: Internal Medicine

## 2022-03-05 ENCOUNTER — Ambulatory Visit: Payer: Medicare Other | Admitting: Internal Medicine

## 2022-03-05 VITALS — BP 122/80 | HR 73 | Ht 73.0 in | Wt 279.0 lb

## 2022-03-05 DIAGNOSIS — E785 Hyperlipidemia, unspecified: Secondary | ICD-10-CM | POA: Diagnosis not present

## 2022-03-05 DIAGNOSIS — E1142 Type 2 diabetes mellitus with diabetic polyneuropathy: Secondary | ICD-10-CM | POA: Diagnosis not present

## 2022-03-05 DIAGNOSIS — G63 Polyneuropathy in diseases classified elsewhere: Secondary | ICD-10-CM | POA: Diagnosis not present

## 2022-03-05 MED ORDER — GABAPENTIN 300 MG PO CAPS: 300.0000 mg | ORAL_CAPSULE | Freq: Every day | ORAL | 3 refills | Status: DC

## 2022-03-05 NOTE — Progress Notes (Signed)
Name: Connor Becker.  Age/ Sex: 71 y.o., male   MRN/ DOB: [id number], June 08, 1951     PCP: Glendale Chard, MD   Reason for Endocrinology Evaluation: Type 2 Diabetes Mellitus  Initial Endocrine Consultative Visit: 04/13/2020    PATIENT IDENTIFIER: [name] is a 71 y.o. male with a past medical history of T2DM,OSA, hypogonadism and dyslipidemia. The patient has followed with Endocrinology clinic since 04/13/2020 for consultative assistance with management of his diabetes.     DIABETIC HISTORY:  Connor Becker was diagnosed with DM2 in 2014,Metformin- GI side effect, Farxiga -severe urinary frequency preventing him from sleeping at night.  His hemoglobin A1c has ranged from 6.6%  in 2020, peaking at 7.7% in 2021.  On his initial visit to our clinic his A1c was 7.7%  ,  He was not on any glycemic medications. Started Rybelsus  Rybelsus stopped by 06/2021 due to nausea and vomiting and started pioglitazone but he did not start it  By 10/2021 he started using his wife's Ozempic and tolerated well  He self discontinued glipizide by August 2023-no reported intolerance just due to fear of hypoglycemia   SUBJECTIVE:   During the last visit (10/31/2021): A1c 8.0% We stopped Rybelsus ,  continued Glipizide and started pioglitazone     Today (03/05/2022): Connor Becker is here for a follow up on diabetes.  He checks his blood sugars multiple times a day through CGM. The patient has not had hypoglycemic episodes since the last clinic visit.   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, he had to stop using CGM until the temperatures cool down    HOME DIABETES REGIMEN:  Ozempic 0.5 mg weekly      Statin: yes ACE-I/ARB: yes   CONTINUOUS GLUCOSE MONITORING RECORD INTERPRETATION    Dates of Recording:  6/18-7/15/2023  Sensor description:freestyle libre   Results statistics:   CGM use % of time 93  Average and SD 134/18.5  Time in range  95%  % Time Above 180 5  % Time above 250 0  % Time Below target 0      Glycemic patterns summary: Bg's optimal during the day and night   Hyperglycemic episodes  Post prandial   Hypoglycemic episodes occurred N.A  Overnight periods: stable and at goal   METER DOWNLOAD SUMMARY: 8/8-8/21/2023 Fingerstick Blood Glucose Tests = 16 Overall Mean FS Glucose = 114 Standard Deviation = 16  BG Ranges: Low = 83 High = 136   Hypoglycemic Events/30 Days: BG < 50 = 0 Episodes of symptomatic severe hypoglycemia = 0    DIABETIC COMPLICATIONS: Microvascular complications:   Denies: CKD, retinopathy, neuropathy Last Eye Exam: Completed 12/19/2021  Macrovascular complications:   Denies: CAD, CVA, PVD   HISTORY:  Past Medical History:  Past Medical History:  Diagnosis Date   ALLERGIC RHINITIS    Chronic pain of both knees    DJD (degenerative joint disease) of knee    right   DM2 (diabetes mellitus, type 2) (HCC)    GERD    Glaucoma    History of nuclear stress test    Myoview 12/17: EF 53, no ischemia, Low Risk   HYPERLIPIDEMIA    myalgias from Lipitor   HYPOGONADISM, MALE    Hypothyroidism    INSOMNIA-SLEEP DISORDER-UNSPEC  Joint pain    Obesity    OSTEOARTHRITIS    SLEEP APNEA, OBSTRUCTIVE    no CPAP; repeat sleep test pending 07/2016   Unspecified hypothyroidism 12/04/2013   Vitamin D deficiency    Past Surgical History:  Past Surgical History:  Procedure Laterality Date   NASAL SEPTUM SURGERY     TONSILLECTOMY     UVULOPALATOPHARYNGOPLASTY     VASECTOMY     Social History:  reports that he has never smoked. He has never used smokeless tobacco. He reports that he does not currently use alcohol. He reports that he does not use drugs. Family History:  Family History  Problem Relation Age of Onset   Heart attack  Mother 67       s/p MI   Other Mother        pacemaker   Diabetes Mother    High blood pressure Mother    High Cholesterol Mother    Thyroid disease Mother    Obesity Mother    Macular degeneration Father    Thyroid disease Father    Diabetes Maternal Uncle    Heart attack Maternal Uncle    Hypertension Other    Prostate cancer Other    Heart disease Other    Colon cancer Neg Hx      HOME MEDICATIONS: Allergies as of 03/05/2022       Reactions   Horseradish [armoracia Rusticana Ext (horseradish)]    Lipitor [atorvastatin] Other (See Comments)   Leg cramps   Metformin And Related Other (See Comments)   GI upset   Semaglutide(0.25 Or 0.4m-dos) Nausea And Vomiting   Soy Allergy Other (See Comments)   Pt stated (SOY SAUCE) causes angioedema, but has added some soy products back in to diet and no angioedema.         Medication List        Accurate as of March 05, 2022 11:54 AM. If you have any questions, ask your nurse or doctor.          STOP taking these medications    amoxicillin-clavulanate 875-125 MG tablet Commonly known as: AUGMENTIN Stopped by: Connor Sciara MD   dicyclomine 20 MG tablet Commonly known as: BENTYL Stopped by: Connor Sciara MD   glipiZIDE 5 MG tablet Commonly known as: GLUCOTROL Stopped by: Connor Sciara MD       TAKE these medications    ascorbic acid 500 MG tablet Commonly known as: VITAMIN C Take 1,000 mg by mouth daily.   azelastine 0.1 % nasal spray Commonly known as: ASTELIN Place 2 sprays into both nostrils 2 (two) times daily. Use in each nostril as directed   EPINEPHrine 0.3 mg/0.3 mL Soaj injection Commonly known as: EPI-PEN Inject 0.3 mg into the muscle as needed for anaphylaxis.   fluticasone 50 MCG/ACT nasal spray Commonly known as: FLONASE Place 2 sprays into both nostrils daily for 14 days.   FreeStyle Libre 2 Reader Devi 1 Device by Does not apply route as directed.    FreeStyle Libre 2 Sensor Misc USE AS DIRECTED EVERY 2 WEEKS   gabapentin 300 MG capsule Commonly known as: NEURONTIN Take 1 capsule (300 mg total) by mouth at bedtime. Started by: Connor Sciara MD   HYDROcodone-acetaminophen 5-325 MG tablet Commonly known as: NORCO/VICODIN Take 2 tablets by mouth every 4 (four) hours as needed.   latanoprost 0.005 % ophthalmic solution Commonly known as: XALATAN Place 1 drop into both eyes daily.   levocetirizine 5  MG tablet Commonly known as: XYZAL SMARTSIG:1 Tablet(s) By Mouth Every Evening   levothyroxine 112 MCG tablet Commonly known as: SYNTHROID Take 1 tablet by mouth once daily   lisinopril 5 MG tablet Commonly known as: ZESTRIL Take 1 tablet by mouth once daily   loperamide 2 MG capsule Commonly known as: IMODIUM Take 1 capsule (2 mg total) by mouth 4 (four) times daily as needed for diarrhea or loose stools.   montelukast 10 MG tablet Commonly known as: SINGULAIR Take 1 tablet (10 mg total) by mouth daily.   omeprazole 40 MG capsule Commonly known as: PRILOSEC Take 1 capsule (40 mg total) by mouth daily.   ondansetron 4 MG disintegrating tablet Commonly known as: Zofran ODT Take 1 tablet (4 mg total) by mouth every 8 (eight) hours as needed for nausea or vomiting.   OneTouch Verio test strip Generic drug: glucose blood USE STRIP TO CHECK BLOOD SUGAR UP TO 4 TIMES PER DAY   Ozempic (0.25 or 0.5 MG/DOSE) 2 MG/3ML Sopn Generic drug: Semaglutide(0.25 or 0.5MG/DOS) INJECT 0.5 MG INTO THE SKIN ONCE A WEEK   promethazine 12.5 MG tablet Commonly known as: PHENERGAN Take 1 tablet (12.5 mg total) by mouth every 6 (six) hours as needed for nausea or vomiting.   pseudoephedrine 30 MG tablet Commonly known as: SUDAFED Take 60 mg by mouth 2 (two) times daily as needed for congestion.   rosuvastatin 20 MG tablet Commonly known as: Crestor Take 1 tablet (20 mg total) by mouth daily.   tadalafil 5 MG tablet Commonly  known as: CIALIS Take 5 mg by mouth daily.   testosterone enanthate 200 MG/ML injection Commonly known as: DELATESTRYL Inject 200 mg into the muscle every 14 (fourteen) days.   timolol 0.5 % ophthalmic solution Commonly known as: TIMOPTIC Place 1 drop into both eyes daily.   Turmeric 500 MG Caps Take 500 mg by mouth daily.   Vitamin D (Ergocalciferol) 1.25 MG (50000 UNIT) Caps capsule Commonly known as: DRISDOL Take 1 capsule (50,000 Units total) by mouth See admin instructions. Take one capsule by mouth on Tues/Fridays         OBJECTIVE:   Vital Signs: BP 122/80 (BP Location: Left Arm, Patient Position: Sitting, Cuff Size: Large)   Pulse 73   Ht '6\' 1"'  (1.854 m)   Wt 279 lb (126.6 kg)   SpO2 98%   BMI 36.81 kg/m   Wt Readings from Last 3 Encounters:  03/05/22 279 lb (126.6 kg)  02/27/22 276 lb 9.6 oz (125.5 kg)  11/19/21 288 lb (130.6 kg)     Exam: General: Pt appears well and is in NAD  Lungs: Clear with good BS bilat with no rales, rhonchi, or wheezes  Heart: RRR with normal S1 and S2 and no gallops; no murmurs; no rub  Extremities: Trace pretibial edema.   Neuro: MS is good with appropriate affect, pt is alert and Ox3   DM foot exam:03/05/2022   The skin of the feet is intact without sores or ulcerations. The pedal pulses are 1+ on right and 1+ on left. The sensation is decreased to a screening 5.07, 10 gram monofilament bilaterally     DATA REVIEWED:  Lab Results  Component Value Date   HGBA1C 6.5 (H) 02/27/2022   HGBA1C 8.0 (H) 10/26/2021   HGBA1C 8.5 (H) 06/28/2021      Latest Reference Range & Units 02/27/22 10:33  Sodium 134 - 144 mmol/L 137  Potassium 3.5 - 5.2 mmol/L 4.8  Chloride  96 - 106 mmol/L 101  CO2 20 - 29 mmol/L 21  Glucose 70 - 99 mg/dL 101 (H)  BUN 8 - 27 mg/dL 16  Creatinine 0.76 - 1.27 mg/dL 1.22  Calcium 8.6 - 10.2 mg/dL 9.6  BUN/Creatinine Ratio 10 - 24  13  eGFR >59 mL/min/1.73 63  Alkaline Phosphatase 44 - 121 IU/L  71  Albumin 3.8 - 4.8 g/dL 4.4  Albumin/Globulin Ratio 1.2 - 2.2  1.8  AST 0 - 40 IU/L 23  ALT 0 - 44 IU/L 25  Total Protein 6.0 - 8.5 g/dL 6.8  Total Bilirubin 0.0 - 1.2 mg/dL 0.4     Latest Reference Range & Units 02/27/22 10:33  Cholesterol, Total 100 - 199 mg/dL 144  HDL Cholesterol >39 mg/dL 42  Triglycerides 0 - 149 mg/dL 131  VLDL Cholesterol Cal 5 - 40 mg/dL 23  LDL Chol Calc (NIH) 0 - 99 mg/dL 79    ASSESSMENT / PLAN / RECOMMENDATIONS:   1) Type 2 Diabetes Mellitus, Optimally controlled, With neuropathic complications - Most recent A1c of 6.5 % Goal A1c < 7.0 %.    -I have praised the patient on optimizing glucose control -In the past he endorsed intolerance to Rybelsus, tolerating Ozempic  -He is intolerant to metformin -He is intolerant to [place] due to frequency impeding his restful sleep at night -No changes   MEDICATIONS:  -Continue Ozempic 0.5 mg weekly     EDUCATION / INSTRUCTIONS: BG monitoring instructions: Patient is instructed to check his blood sugars 2 times a day, fasting and bedtime . Call [facility] if: BG persistently < 70  I reviewed the Rule of 15 for the treatment of hypoglycemia in detail with the patient. Literature supplied.    2) Diabetic complications:  Eye: Does not have known diabetic retinopathy.  Neuro/ Feet: Does not have known diabetic peripheral neuropathy .  Renal: Patient does not have known baseline CKD.      3) Dyslipidemia :    -LDL at goal, encourage compliance   medication Continue rosuvastatin 20 mg daily     4) Peripheral Neuropathy :  - Cautioned about drowsiness and dizziness   Medication  Start Gabapentin 300 mg QHS    F/U in 6 months     Signed electronically by: Mack Guise, MD  [facility]  [facility] [address], [place] [address] Phone: [phone] FAX: [phone]   CC: Glendale Chard, [place]  [address] [place] [address] Phone: [phone]  Fax: [phone]  Return to Endocrinology clinic as below: Future Appointments  Date Time Provider [place]  08/02/2022 10:40 AM Glendale Chard, MD [facility] None  08/06/2022  9:10 AM Kobee Medlen, Melanie Crazier, MD [facility] None  11/08/2022  2:00 PM [facility] None  11/08/2022  2:40 PM Glendale Chard, MD [facility] None

## 2022-03-05 NOTE — Patient Instructions (Signed)
-   Continue Ozempic 0.5 mg weekly     HOW TO TREAT LOW BLOOD SUGARS (Blood sugar LESS THAN 70 MG/DL) Please follow the RULE OF 15 for the treatment of hypoglycemia treatment (when your (blood sugars are less than 70 mg/dL)   STEP 1: Take 15 grams of carbohydrates when your blood sugar is low, which includes:  3-4 GLUCOSE TABS  OR 3-4 OZ OF JUICE OR REGULAR SODA OR ONE TUBE OF GLUCOSE GEL    STEP 2: RECHECK blood sugar in 15 MINUTES STEP 3: If your blood sugar is still low at the 15 minute recheck --> then, go back to STEP 1 and treat AGAIN with another 15 grams of carbohydrates. 

## 2022-03-12 ENCOUNTER — Other Ambulatory Visit: Payer: Self-pay | Admitting: Internal Medicine

## 2022-03-12 DIAGNOSIS — E039 Hypothyroidism, unspecified: Secondary | ICD-10-CM

## 2022-03-30 ENCOUNTER — Telehealth: Payer: Medicare Other | Admitting: Physician Assistant

## 2022-03-30 DIAGNOSIS — J0140 Acute pansinusitis, unspecified: Secondary | ICD-10-CM

## 2022-03-30 MED ORDER — AMOXICILLIN-POT CLAVULANATE 875-125 MG PO TABS: 1.0000 | ORAL_TABLET | Freq: Two times a day (BID) | ORAL | 0 refills | Status: DC

## 2022-03-30 NOTE — Progress Notes (Signed)

## 2022-04-06 ENCOUNTER — Other Ambulatory Visit: Payer: Self-pay | Admitting: Internal Medicine

## 2022-05-07 ENCOUNTER — Other Ambulatory Visit: Payer: Self-pay | Admitting: Internal Medicine

## 2022-05-25 ENCOUNTER — Other Ambulatory Visit: Payer: Self-pay | Admitting: Internal Medicine

## 2022-06-08 ENCOUNTER — Telehealth: Payer: Medicare Other | Admitting: Physician Assistant

## 2022-06-08 DIAGNOSIS — B9789 Other viral agents as the cause of diseases classified elsewhere: Secondary | ICD-10-CM

## 2022-06-08 DIAGNOSIS — J019 Acute sinusitis, unspecified: Secondary | ICD-10-CM

## 2022-06-08 MED ORDER — AMOXICILLIN-POT CLAVULANATE 875-125 MG PO TABS
1.0000 | ORAL_TABLET | Freq: Two times a day (BID) | ORAL | 0 refills | Status: DC
Start: 2022-06-08 — End: 2023-01-31

## 2022-06-08 MED ORDER — FLUTICASONE PROPIONATE 50 MCG/ACT NA SUSP: 2.0000 | Freq: Every day | NASAL | 0 refills | Status: AC

## 2022-06-08 NOTE — Addendum Note (Signed)
Addended by: Mar Daring on: 06/08/2022 09:54 AM   Modules accepted: Orders

## 2022-06-08 NOTE — Progress Notes (Signed)
E-Visit for Sinus Problems  We are sorry that you are not feeling well.  Here is how we plan to help!  Based on what you have shared with me it looks like you have sinusitis.  Sinusitis is inflammation and infection in the sinus cavities of the head.  Based on your presentation I believe you most likely have Acute Viral Sinusitis.This is an infection most likely caused by a virus. There is not specific treatment for viral sinusitis other than to help you with the symptoms until the infection runs its course.  You may use an oral decongestant such as Mucinex D or if you have glaucoma or high blood pressure use plain Mucinex. Saline nasal spray help and can safely be used as often as needed for congestion, I have prescribed: Fluticasone nasal spray two sprays in each nostril once a day  Some authorities believe that zinc sprays or the use of Echinacea may shorten the course of your symptoms.  Sinus infections are not as easily transmitted as other respiratory infection, however we still recommend that you avoid close contact with loved ones, especially the very young and elderly.  Remember to wash your hands thoroughly throughout the day as this is the number one way to prevent the spread of infection!  Home Care: Only take medications as instructed by your medical team. Do not take these medications with alcohol. A steam or ultrasonic humidifier can help congestion.  You can place a towel over your head and breathe in the steam from hot water coming from a faucet. Avoid close contacts especially the very young and the elderly. Cover your mouth when you cough or sneeze. Always remember to wash your hands.  Get Help Right Away If: You develop worsening fever or sinus pain. You develop a severe head ache or visual changes. Your symptoms persist after you have completed your treatment plan.  Make sure you Understand these instructions. Will watch your condition. Will get help right away if you  are not doing well or get worse.   Thank you for choosing an e-visit.  Your e-visit answers were reviewed by a board certified advanced clinical practitioner to complete your personal care plan. Depending upon the condition, your plan could have included both over the counter or prescription medications.  Please review your pharmacy choice. Make sure the pharmacy is open so you can pick up prescription now. If there is a problem, you may contact your provider through MyChart messaging and have the prescription routed to another pharmacy.  Your safety is important to us. If you have drug allergies check your prescription carefully.   For the next 24 hours you can use MyChart to ask questions about today's visit, request a non-urgent call back, or ask for a work or school excuse. You will get an email in the next two days asking about your experience. I hope that your e-visit has been valuable and will speed your recovery.  I have spent 5 minutes in review of e-visit questionnaire, review and updating patient chart, medical decision making and response to patient.   Tarisha Fader M Tahliyah Anagnos, PA-C  

## 2022-06-14 ENCOUNTER — Other Ambulatory Visit: Payer: Self-pay | Admitting: Internal Medicine

## 2022-06-14 DIAGNOSIS — E039 Hypothyroidism, unspecified: Secondary | ICD-10-CM

## 2022-06-19 ENCOUNTER — Other Ambulatory Visit: Payer: Self-pay | Admitting: Internal Medicine

## 2022-06-19 DIAGNOSIS — E1165 Type 2 diabetes mellitus with hyperglycemia: Secondary | ICD-10-CM

## 2022-06-19 DIAGNOSIS — E039 Hypothyroidism, unspecified: Secondary | ICD-10-CM

## 2022-06-19 DIAGNOSIS — I700 Atherosclerosis of aorta: Secondary | ICD-10-CM

## 2022-06-19 DIAGNOSIS — E6609 Other obesity due to excess calories: Secondary | ICD-10-CM

## 2022-06-22 DIAGNOSIS — H2513 Age-related nuclear cataract, bilateral: Secondary | ICD-10-CM | POA: Diagnosis not present

## 2022-06-22 DIAGNOSIS — H401133 Primary open-angle glaucoma, bilateral, severe stage: Secondary | ICD-10-CM | POA: Diagnosis not present

## 2022-06-22 DIAGNOSIS — H353132 Nonexudative age-related macular degeneration, bilateral, intermediate dry stage: Secondary | ICD-10-CM | POA: Diagnosis not present

## 2022-06-22 DIAGNOSIS — H47233 Glaucomatous optic atrophy, bilateral: Secondary | ICD-10-CM | POA: Diagnosis not present

## 2022-06-22 LAB — HM DIABETES EYE EXAM

## 2022-07-03 ENCOUNTER — Telehealth: Payer: Self-pay

## 2022-07-03 NOTE — Progress Notes (Signed)
  Chronic Care Management   Note  07/03/2022 Name: Connor Becker. MRN: [id number] DOB: [birth year]  [name]. is a 71 y.o. year old male who is a primary care patient of Glendale Chard, MD. I reached out to [name]. by phone today in response to a referral sent by Mr. ROMARI GASPARRO Jr.'s PCP.  Mr. Seubert was given information about Chronic Care Management services today including:  CCM service includes personalized support from designated clinical staff supervised by the physician, including individualized plan of care and coordination with other care providers 24/7 contact phone numbers for assistance for urgent and routine care needs. Service will only be billed when office clinical staff spend 20 minutes or more in a month to coordinate care. Only one practitioner may furnish and bill the service in a calendar month. The patient may stop CCM services at amy time (effective at the end of the month) by phone call to the office staff. The patient will be responsible for cost sharing (co-pay) or up to 20% of the service fee (after annual deductible is met)  Mr. Roderick Calo.  agreedto scheduling an appointment with the CCM RN Case Manager and Pharm D   Follow up plan: Patient agreed to scheduled appointment with RN Case Manager on 07/12/2022 and Pharm D 07/14/2023(date/time).   Noreene Larsson, [place],  [id number] Direct Dial: [phone] [email or web address]

## 2022-07-06 ENCOUNTER — Telehealth: Payer: Self-pay

## 2022-07-06 NOTE — Progress Notes (Signed)
07-06-2022: Left patient a VM to call and reschedule initial appointment with Orlando Penner on 07-13-2022. Appointment canceled.  [place] Pharmacist Assistant [phone]

## 2022-07-12 ENCOUNTER — Ambulatory Visit (INDEPENDENT_AMBULATORY_CARE_PROVIDER_SITE_OTHER): Payer: Medicare Other | Admitting: *Deleted

## 2022-07-12 DIAGNOSIS — E1165 Type 2 diabetes mellitus with hyperglycemia: Secondary | ICD-10-CM

## 2022-07-12 DIAGNOSIS — I700 Atherosclerosis of aorta: Secondary | ICD-10-CM

## 2022-07-12 NOTE — Plan of Care (Signed)
 Chronic Care Management Provider Comprehensive Care Plan    07/12/2022 Name: Connor R Laughery Jr. MRN: [id number] DOB: 07/20/1950  Referral to Chronic Care Management (CCM) services was placed by Provider:  Robyn Sanders MD on Date: 06/19/22.  Chronic Condition 1: DIABETES Provider Assessment and Plan  Type 2 diabetes mellitus with hyperglycemia, without long-term current use of insulin (HCC) Comments: Chronic, he is now followed by Endo. I will check a1c/bmp today. BS log on Libre/OT Verio meter reviewed, sugars have improved greatly.  - CMP14+EGFR - Lipid panel - CBC - Hemoglobin A1c   Expected Outcome/Goals Addressed This Visit (Provider CCM goals/Provider Assessment and plan  CCM (DIABETES) EXPECTED OUTCOME:  MONITOR, SELF-MANAGE AND REDUCE SYMPTOMS OF DIABETES  Symptom Management Condition 1: Take medications as prescribed   Attend all scheduled provider appointments Call pharmacy for medication refills 3-7 days in advance of running out of medications Attend church or other social activities Perform all self care activities independently  Perform IADL's (shopping, preparing meals, housekeeping, managing finances) independently Call provider office for new concerns or questions  check blood sugar at prescribed times: Freestyle Libre  check feet daily for cuts, sores or redness enter blood sugar readings and medication or insulin into daily log take the blood sugar log to all doctor visits take the blood sugar meter to all doctor visits trim toenails straight across fill half of plate with vegetables limit fast food meals to no more than 1 per week manage portion size read food labels for fat, fiber, carbohydrates and portion size Look over and complete Advanced directives packet mailed Look over education sent via my chart- hypoglycemia  Chronic Condition 2: ATHEROSCLEROSIS OF AORTA Provider Assessment and Plan  Atherosclerosis of aorta (HCC) Comments: Chronic, he  reports he has improved compliance with rosuvastatin. Has not filled recently because he had "leftover pills". Now taking daily. LDL goal <70.  - Lipid panel   Expected Outcome/Goals Addressed This Visit (Provider CCM goals/Provider Assessment and plan  CCM (ATHEROSCLEROSIS OF AORTA) EXPECTED OUTCOME:  MONITOR, SELF-MANAGE AND REDUCE SYMPTOMS OF ATHEROSCLEROSIS OF AORTA  Symptom Management Condition 2: Take medications as prescribed   Attend all scheduled provider appointments Call pharmacy for medication refills 3-7 days in advance of running out of medications Attend church or other social activities Perform all self care activities independently  Perform IADL's (shopping, preparing meals, housekeeping, managing finances) independently Call provider office for new concerns or questions  Try to do some type of exercise even if walking for a few minutes at a time Look over education sent via my chart- heart healthy diet  Problem List Patient Active Problem List   Diagnosis Date Noted   Type 2 diabetes mellitus with diabetic polyneuropathy, without long-term current use of insulin (HCC) 03/05/2022   Polyneuropathy associated with underlying disease (HCC) 03/05/2022   Nocturia 06/28/2021   Atherosclerosis of aorta (HCC) 06/28/2021   Intermittent claudication (HCC) 10/19/2020   Elevated morning serum cortisol level 10/19/2020   Diarrhea of presumed infectious origin    Sepsis (HCC) 07/01/2020   Dyslipidemia 04/13/2020   Type 2 diabetes mellitus with hyperglycemia, without long-term current use of insulin (HCC) 04/13/2020   Insulin resistance 07/01/2018   Orthostatic hypotension 05/19/2018   Type 2 diabetes mellitus with hyperglycemia (HCC) 05/19/2018   Vitamin D deficiency disease 05/19/2018   Obstructive sleep apnea 05/19/2018   Glaucoma 05/19/2018   Class 3 severe obesity with serious comorbidity and body mass index (BMI) of 40.0 to 44.9 in adult (HCC) 05/19/2018     Fatigue  02/01/2016   Dizziness 02/01/2016   PSA elevation 11/17/2015   Osteoarthrosis involving lower leg 11/17/2015   Ulnar neuritis 04/14/2015   Acute sinus infection 04/27/2014   Primary hypothyroidism 12/04/2013   Morbid obesity (HCC) 12/04/2013   Right shoulder pain 10/31/2010   Preventative health care 10/28/2010   Diabetes (HCC) 10/26/2010   INSOMNIA-SLEEP DISORDER-UNSPEC 07/11/2007   GERD 07/11/2007   Hypogonadism in male 04/06/2007   Mixed hyperlipidemia 04/06/2007   ERECTILE DYSFUNCTION 04/06/2007   SLEEP APNEA, OBSTRUCTIVE 04/06/2007   Allergic rhinitis 04/06/2007   OSTEOARTHRITIS 04/06/2007    Medication Management  Current Outpatient Medications:    azelastine (ASTELIN) 0.1 % nasal spray, Place 2 sprays into both nostrils 2 (two) times daily. Use in each nostril as directed, Disp: 30 mL, Rfl: 2   Continuous Blood Gluc Receiver (FREESTYLE LIBRE 2 READER) DEVI, 1 Device by Does not apply route as directed., Disp: 1 each, Rfl: 0   Continuous Blood Gluc Sensor (FREESTYLE LIBRE 2 SENSOR) MISC, USE AS DIRECTED EVERY 2 WEEKS, Disp: 2 each, Rfl: 0   EPINEPHrine 0.3 mg/0.3 mL IJ SOAJ injection, Inject 0.3 mg into the muscle as needed for anaphylaxis., Disp: 1 each, Rfl: 0   fluticasone (FLONASE) 50 MCG/ACT nasal spray, Place 2 sprays into both nostrils daily., Disp: 16 g, Rfl: 0   gabapentin (NEURONTIN) 300 MG capsule, Take 1 capsule (300 mg total) by mouth at bedtime., Disp: 90 capsule, Rfl: 3   latanoprost (XALATAN) 0.005 % ophthalmic solution, Place 1 drop into both eyes daily., Disp: , Rfl:    levocetirizine (XYZAL) 5 MG tablet, SMARTSIG:1 Tablet(s) By Mouth Every Evening, Disp: , Rfl:    levothyroxine (SYNTHROID) 112 MCG tablet, Take 1 tablet by mouth once daily, Disp: 90 tablet, Rfl: 0   lisinopril (ZESTRIL) 5 MG tablet, Take 1 tablet by mouth once daily, Disp: 90 tablet, Rfl: 0   loperamide (IMODIUM) 2 MG capsule, Take 1 capsule (2 mg total) by mouth 4 (four) times daily as  needed for diarrhea or loose stools., Disp: 12 capsule, Rfl: 0   omeprazole (PRILOSEC) 40 MG capsule, Take 1 capsule (40 mg total) by mouth daily., Disp: 60 capsule, Rfl: 5   ONETOUCH VERIO test strip, USE STRIP TO CHECK BLOOD SUGAR UP TO 4 TIMES PER DAY, Disp: 200 each, Rfl: 11   OZEMPIC, 0.25 OR 0.5 MG/DOSE, 2 MG/3ML SOPN, INJECT  0.5 MG SUBCUTANEOUSLY ONCE A WEEK, Disp: 6 mL, Rfl: 2   promethazine (PHENERGAN) 12.5 MG tablet, Take 1 tablet (12.5 mg total) by mouth every 6 (six) hours as needed for nausea or vomiting., Disp: 6 tablet, Rfl: 0   pseudoephedrine (SUDAFED) 30 MG tablet, Take 60 mg by mouth 2 (two) times daily as needed for congestion., Disp: , Rfl:    rosuvastatin (CRESTOR) 20 MG tablet, Take 1 tablet (20 mg total) by mouth daily., Disp: 90 tablet, Rfl: 3   tadalafil (CIALIS) 5 MG tablet, Take 5 mg by mouth daily., Disp: , Rfl: 11   timolol (TIMOPTIC) 0.5 % ophthalmic solution, Place 1 drop into both eyes daily., Disp: , Rfl: 2   Turmeric 500 MG CAPS, Take 500 mg by mouth daily., Disp: , Rfl:    Vitamin D, Ergocalciferol, (DRISDOL) 1.25 MG (50000 UNIT) CAPS capsule, Take 1 capsule (50,000 Units total) by mouth See admin instructions. Take one capsule by mouth on Tues/Fridays, Disp: 24 capsule, Rfl: 0   amoxicillin-clavulanate (AUGMENTIN) 875-125 MG tablet, Take 1 tablet by mouth 2 (two) times   daily. (Patient not taking: Reported on 07/12/2022), Disp: 14 tablet, Rfl: 0   HYDROcodone-acetaminophen (NORCO/VICODIN) 5-325 MG tablet, Take 2 tablets by mouth every 4 (four) hours as needed. (Patient not taking: Reported on 07/12/2022), Disp: 6 tablet, Rfl: 0   montelukast (SINGULAIR) 10 MG tablet, Take 1 tablet (10 mg total) by mouth daily. (Patient not taking: Reported on 07/12/2022), Disp: 30 tablet, Rfl: 2   ondansetron (ZOFRAN ODT) 4 MG disintegrating tablet, Take 1 tablet (4 mg total) by mouth every 8 (eight) hours as needed for nausea or vomiting. (Patient not taking: Reported on  07/12/2022), Disp: 20 tablet, Rfl: 0   testosterone enanthate (DELATESTRYL) 200 MG/ML injection, Inject 200 mg into the muscle every 14 (fourteen) days. (Patient not taking: Reported on 07/12/2022), Disp: , Rfl:    vitamin C (ASCORBIC ACID) 500 MG tablet, Take 1,000 mg by mouth daily. (Patient not taking: Reported on 07/12/2022), Disp: , Rfl:   Cognitive Assessment Identity Confirmed: : Name; DOB Cognitive Status: Normal   Functional Assessment Hearing Difficulty or Deaf: no Wear Glasses or Blind: yes Vision Management: reading glasses and contacts Concentrating, Remembering or Making Decisions Difficulty (CP): no Difficulty Communicating: no Difficulty Eating/Swallowing: no Walking or Climbing Stairs Difficulty: no Dressing/Bathing Difficulty: no Doing Errands Independently Difficulty (such as shopping) (CP): no   Caregiver Assessment  Primary Source of Support/Comfort: spouse Name of Support/Comfort Primary Source: Connor Becker People in Home: spouse Name(s) of People in Home: spouse Connor Becker   Planned Interventions  Provided education to patient about basic DM disease process; Reviewed medications with patient and discussed importance of medication adherence;        Reviewed prescribed diet with patient carbohydrate modified; Discussed plans with patient for ongoing care management follow up and provided patient with direct contact information for care management team;      Provided patient with written educational materials related to hypo and hyperglycemia and importance of correct treatment;       Advised patient, providing education and rationale, to check cbg per [facility] [employer] [place]  and record        Review of patient status, including review of consultants reports, relevant laboratory and other test results, and medications completed;       Advised patient to discuss any issues with blood sugar with provider;      Screening for signs and symptoms of  depression related to chronic disease state;        Assessed social determinant of health barriers;        Advanced directives packet mailed Assessed understanding of CAD diagnosis Medications reviewed including medications utilized in CAD treatment plan Provided education on importance of blood pressure control in management of CAD; Provided education on Importance of limiting foods high in cholesterol; Counseled on the importance of exercise goals with target of 150 minutes per week Reviewed Importance of taking all medications as prescribed Reviewed Importance of attending all scheduled provider appointments Screening for signs and symptoms of depression related to chronic disease state;  Assessed social determinant of health barriers;  Education sent via my chart- heart healthy diet  Interaction and coordination with outside resources, practitioners, and providers See CCM Referral  Care Plan: Available in MyChart

## 2022-07-12 NOTE — Chronic Care Management (AMB) (Signed)
Chronic Care Management   CCM RN Visit Note  07/12/2022 Name: Connor Becker. MRN: [id number] DOB: 1951/01/02  Subjective: Connor Becker. is a 71 y.o. year old male who is a primary care patient of Glendale Chard, MD. The patient was referred to the Chronic Care Management team for assistance with care management needs subsequent to provider initiation of CCM services and plan of care.    Today's Visit:  Engaged with patient by telephone for initial visit.     SDOH Interventions Today    Flowsheet Row Most Recent Value  SDOH Interventions   Food Insecurity Interventions Intervention Not Indicated  Housing Interventions Intervention Not Indicated  Utilities Interventions Intervention Not Indicated  Financial Strain Interventions Intervention Not Indicated  Physical Activity Interventions Patient Refused  Stress Interventions Intervention Not Indicated  Social Connections Interventions Intervention Not Indicated, Patient Refused         Goals Addressed             This Visit's Progress    CCM (ATHEROSCLEROSIS OF AORTA) EXPECTED OUTCOME:  MONITOR, SELF-MANAGE AND REDUCE SYMPTOMS OF ATHEROSCLEROSIS OF AORTA       Current Barriers:  Knowledge Deficits related to Atherosclerosis of Aorta Chronic Disease Management support and education needs related to Atherosclerosis of Aorta, diet, exercise No Advanced Directives in place- patient requests information be mailed Patient reports he lives with spouse, is independent with all aspects of his care, continues to drive, does not follow a special diet, does not have a formal exercise routine but does yard work and other work around [employer].  Planned Interventions: Assessed understanding of CAD diagnosis Medications reviewed including medications utilized in CAD treatment plan Provided education on importance of blood pressure control in management of CAD; Provided education on Importance of limiting foods high in  cholesterol; Counseled on the importance of exercise goals with target of 150 minutes per week Reviewed Importance of taking all medications as prescribed Reviewed Importance of attending all scheduled provider appointments Screening for signs and symptoms of depression related to chronic disease state;  Assessed social determinant of health barriers;  Education sent via my chart- heart healthy diet  Symptom Management: Take medications as prescribed   Attend all scheduled provider appointments Call pharmacy for medication refills 3-7 days in advance of running out of medications Attend church or other social activities Perform all self care activities independently  Perform IADL's (shopping, preparing meals, housekeeping, managing finances) independently Call provider office for new concerns or questions  Try to do some type of exercise even if walking for a few minutes at a time Look over education sent via my chart- heart healthy diet  Follow Up Plan: Telephone follow up appointment with care management team member scheduled for:  09/11/22 at 1045 am       CCM (DIABETES) EXPECTED OUTCOME:  MONITOR, SELF-MANAGE AND REDUCE SYMPTOMS OF DIABETES       Current Barriers:  Knowledge Deficits related to Diabetes management Chronic Disease Management support and education needs related to Diabetes and diet No Advanced Directives in place- patient requests information to be mailed Patient reports CBG monitored by [facility] Libre and also fingerstick at times (mostly for comparison), states fasting CBG ranges 110-130, random CBG ranges 150-160's and CBG much improved with recent AIC 6.5 since being on Ozempic.  Pt reports he does not follow a special diet, drinks occasional sugary soft drink.  Planned Interventions: Provided education to patient about basic DM disease process; Reviewed medications  with patient and discussed importance of medication adherence;        Reviewed prescribed  diet with patient carbohydrate modified; Discussed plans with patient for ongoing care management follow up and provided patient with direct contact information for care management team;      Provided patient with written educational materials related to hypo and hyperglycemia and importance of correct treatment;       Advised patient, providing education and rationale, to check cbg per [facility] [employer] [place]  and record        Review of patient status, including review of consultants reports, relevant laboratory and other test results, and medications completed;       Advised patient to discuss any issues with blood sugar with provider;      Screening for signs and symptoms of depression related to chronic disease state;        Assessed social determinant of health barriers;        Advanced directives packet mailed  Symptom Management: Take medications as prescribed   Attend all scheduled provider appointments Call pharmacy for medication refills 3-7 days in advance of running out of medications Attend church or other social activities Perform all self care activities independently  Perform IADL's (shopping, preparing meals, housekeeping, managing finances) independently Call provider office for new concerns or questions  check blood sugar at prescribed times: Freestyle Libre  check feet daily for cuts, sores or redness enter blood sugar readings and medication or insulin into daily log take the blood sugar log to all doctor visits take the blood sugar meter to all doctor visits trim toenails straight across fill half of plate with vegetables limit fast food meals to no more than 1 per week manage portion size read food labels for fat, fiber, carbohydrates and portion size Look over and complete Advanced directives packet mailed Look over education sent via my chart- hypoglycemia  Follow Up Plan: Telephone follow up appointment with care management team member scheduled for:  09/11/22 at  1045 am          Plan:Telephone follow up appointment with care management team member scheduled for:  09/11/22 at [place] [employer] am  Jacqlyn Larsen [facility], BSN RN Case Manager [facility] [phone]

## 2022-07-12 NOTE — Patient Instructions (Signed)
Please call the care guide team at [phone] if you need to cancel or reschedule your appointment.   If you are experiencing a Mental Health or [place] or need someone to talk to, please call the Suicide and Crisis Lifeline: 988 call the [place] National Suicide Prevention Lifeline: 857-777-7121 or TTY: (212) 304-4593 TTY (684) 779-8455) to talk to a trained counselor call 1-800-273-TALK (toll free, 24 hour hotline) go to [facility] [address] [place] [phone]) call the [facility]: [phone] call 911   Following is a copy of the CCM Program Consent:  CCM service includes personalized support from designated clinical staff supervised by the physician, including individualized plan of care and coordination with other care providers 24/7 contact phone numbers for assistance for urgent and routine care needs. Service will only be billed when office clinical staff spend 20 minutes or more in a month to coordinate care. Only one practitioner may furnish and bill the service in a calendar month. The patient may stop CCM services at amy time (effective at the end of the month) by phone call to the office staff. The patient will be responsible for cost sharing (co-pay) or up to 20% of the service fee (after annual deductible is met)  Following is a copy of your full provider care plan:   Goals Addressed             This Visit's Progress    CCM (ATHEROSCLEROSIS OF AORTA) EXPECTED OUTCOME:  MONITOR, SELF-MANAGE AND REDUCE SYMPTOMS OF ATHEROSCLEROSIS OF AORTA       Current Barriers:  Knowledge Deficits related to Atherosclerosis of Aorta Chronic Disease Management support and education needs related to Atherosclerosis of Aorta, diet, exercise No Advanced Directives in place- patient requests information be mailed Patient reports he lives with spouse, is independent with all aspects of his care, continues to  drive, does not follow a special diet, does not have a formal exercise routine but does yard work and other work around [employer].  Planned Interventions: Assessed understanding of CAD diagnosis Medications reviewed including medications utilized in CAD treatment plan Provided education on importance of blood pressure control in management of CAD; Provided education on Importance of limiting foods high in cholesterol; Counseled on the importance of exercise goals with target of 150 minutes per week Reviewed Importance of taking all medications as prescribed Reviewed Importance of attending all scheduled provider appointments Screening for signs and symptoms of depression related to chronic disease state;  Assessed social determinant of health barriers;  Education sent via my chart- heart healthy diet  Symptom Management: Take medications as prescribed   Attend all scheduled provider appointments Call pharmacy for medication refills 3-7 days in advance of running out of medications Attend church or other social activities Perform all self care activities independently  Perform IADL's (shopping, preparing meals, housekeeping, managing finances) independently Call provider office for new concerns or questions  Try to do some type of exercise even if walking for a few minutes at a time Look over education sent via my chart- heart healthy diet  Follow Up Plan: Telephone follow up appointment with care management team member scheduled for:  09/11/22 at 1045 am       CCM (DIABETES) EXPECTED OUTCOME:  MONITOR, SELF-MANAGE AND REDUCE SYMPTOMS OF DIABETES       Current Barriers:  Knowledge Deficits related to Diabetes management Chronic Disease Management support and education needs related to Diabetes and diet No Advanced Directives in  place- patient requests information to be mailed Patient reports CBG monitored by CGM Freestyle Libre and also fingerstick at times (mostly for comparison),  states fasting CBG ranges 110-130, random CBG ranges 150-160's and CBG much improved with recent AIC 6.5 since being on Ozempic.  Pt reports he does not follow a special diet, drinks occasional sugary soft drink.  Planned Interventions: Provided education to patient about basic DM disease process; Reviewed medications with patient and discussed importance of medication adherence;        Reviewed prescribed diet with patient carbohydrate modified; Discussed plans with patient for ongoing care management follow up and provided patient with direct contact information for care management team;      Provided patient with written educational materials related to hypo and hyperglycemia and importance of correct treatment;       Advised patient, providing education and rationale, to check cbg per CGM Freestyle [place]  and record        Review of patient status, including review of consultants reports, relevant laboratory and other test results, and medications completed;       Advised patient to discuss any issues with blood sugar with provider;      Screening for signs and symptoms of depression related to chronic disease state;        Assessed social determinant of health barriers;        Advanced directives packet mailed  Symptom Management: Take medications as prescribed   Attend all scheduled provider appointments Call pharmacy for medication refills 3-7 days in advance of running out of medications Attend church or other social activities Perform all self care activities independently  Perform IADL's (shopping, preparing meals, housekeeping, managing finances) independently Call provider office for new concerns or questions  check blood sugar at prescribed times: Freestyle Libre  check feet daily for cuts, sores or redness enter blood sugar readings and medication or insulin into daily log take the blood sugar log to all doctor visits take the blood sugar meter to all doctor visits trim  toenails straight across fill half of plate with vegetables limit fast food meals to no more than 1 per week manage portion size read food labels for fat, fiber, carbohydrates and portion size Look over and complete Advanced directives packet mailed Look over education sent via my chart- hypoglycemia  Follow Up Plan: Telephone follow up appointment with care management team member scheduled for:  09/11/22 at 1045 am          Patient verbalizes understanding of instructions and care plan provided today and agrees to view in [place]. Active MyChart status and patient understanding of how to access instructions and care plan via MyChart confirmed with patient.     Telephone follow up appointment with care management team member scheduled for:   09/11/22 at 104 am  Heart-Healthy Eating Plan Eating a healthy diet is important for the health of your heart. A heart-healthy eating plan includes: Eating less unhealthy fats. Eating more healthy fats. Eating less salt in your food. Salt is also called sodium. Making other changes in your diet. Talk with your doctor or a diet specialist (dietitian) to create an eating plan that is right for you. What is my plan? Your doctor may recommend an eating plan that includes: Total fat: ______% or less of total calories a day. Saturated fat: ______% or less of total calories a day. Cholesterol: less than _________mg a day. Sodium: less than _________mg a day. What are tips for following this  plan? Cooking Avoid frying your food. Try to bake, boil, grill, or broil it instead. You can also reduce fat by: Removing the skin from poultry. Removing all visible fats from meats. Steaming vegetables in water or broth. Meal planning  At meals, divide your plate into four equal parts: Fill one-half of your plate with vegetables and green salads. Fill one-fourth of your plate with whole grains. Fill one-fourth of your plate with lean protein foods. Eat  2-4 cups of vegetables per day. One cup of vegetables is: 1 cup (91 g) broccoli or cauliflower florets. 2 medium carrots. 1 large bell pepper. 1 large sweet potato. 1 large tomato. 1 medium white potato. 2 cups (150 g) raw leafy greens. Eat 1-2 cups of fruit per day. One cup of fruit is: 1 small apple 1 large banana 1 cup (237 g) mixed fruit, 1 large orange,  cup (82 g) dried fruit, 1 cup (240 mL) 100% fruit juice. Eat more foods that have soluble fiber. These are apples, broccoli, carrots, beans, peas, and barley. Try to get 20-30 g of fiber per day. Eat 4-5 servings of nuts, legumes, and seeds per week: 1 serving of dried beans or legumes equals  cup (90 g) cooked. 1 serving of nuts is  oz (12 almonds, 24 pistachios, or 7 walnut halves). 1 serving of seeds equals  oz (8 g). General information Eat more home-cooked food. Eat less restaurant, buffet, and fast food. Limit or avoid alcohol. Limit foods that are high in starch and sugar. Avoid fried foods. Lose weight if you are overweight. Keep track of how much salt (sodium) you eat. This is important if you have high blood pressure. Ask your doctor to tell you more about this. Try to add vegetarian meals each week. Fats Choose healthy fats. These include olive oil and canola oil, flaxseeds, walnuts, almonds, and seeds. Eat more omega-3 fats. These include salmon, mackerel, sardines, tuna, flaxseed oil, and ground flaxseeds. Try to eat fish at least 2 times each week. Check food labels. Avoid foods with trans fats or high amounts of saturated fat. Limit saturated fats. These are often found in animal products, such as meats, butter, and cream. These are also found in plant foods, such as palm oil, palm kernel oil, and coconut oil. Avoid foods with partially hydrogenated oils in them. These have trans fats. Examples are stick margarine, some tub margarines, cookies, crackers, and other baked goods. What foods should I  eat? Fruits All fresh, canned (in natural juice), or frozen fruits. Vegetables Fresh or frozen vegetables (raw, steamed, roasted, or grilled). Green salads. Grains Most grains. Choose whole wheat and whole grains most of the time. Rice and pasta, including brown rice and pastas made with whole wheat. Meats and other proteins Lean, well-trimmed beef, veal, pork, and lamb. Chicken and [place] without skin. All fish and shellfish. Wild duck, rabbit, pheasant, and venison. Egg whites or low-cholesterol egg substitutes. Dried beans, peas, lentils, and tofu. Seeds and most nuts. Dairy Low-fat or nonfat cheeses, including ricotta and mozzarella. Skim or 1% milk that is liquid, powdered, or evaporated. Buttermilk that is made with low-fat milk. Nonfat or low-fat yogurt. Fats and oils Non-hydrogenated (trans-free) margarines. Vegetable oils, including soybean, sesame, sunflower, olive, peanut, safflower, corn, canola, and cottonseed. Salad dressings or mayonnaise made with a vegetable oil. Beverages Mineral water. Coffee and tea. Diet carbonated beverages. Sweets and desserts Sherbet, gelatin, and fruit ice. Small amounts of dark chocolate. Limit all sweets and desserts. Seasonings and condiments  All seasonings and condiments. The items listed above may not be a complete list of foods and drinks you can eat. Contact a dietitian for more options. What foods should I avoid? Fruits Canned fruit in heavy syrup. Fruit in cream or butter sauce. Fried fruit. Limit coconut. Vegetables Vegetables cooked in cheese, cream, or butter sauce. Fried vegetables. Grains Breads that are made with saturated or trans fats, oils, or whole milk. Croissants. Sweet rolls. Donuts. High-fat crackers, such as cheese crackers. Meats and other proteins Fatty meats, such as hot dogs, ribs, sausage, bacon, rib-eye roast or steak. High-fat deli meats, such as salami and bologna. Caviar. Domestic duck and goose. Organ meats, such  as liver. Dairy Cream, sour cream, cream cheese, and creamed cottage cheese. Whole-milk cheeses. Whole or 2% milk that is liquid, evaporated, or condensed. Whole buttermilk. Cream sauce or high-fat cheese sauce. Yogurt that is made from whole milk. Fats and oils Meat fat, or shortening. Cocoa butter, hydrogenated oils, palm oil, coconut oil, palm kernel oil. Solid fats and shortenings, including bacon fat, salt pork, lard, and butter. Nondairy cream substitutes. Salad dressings with cheese or sour cream. Beverages Regular sodas and juice drinks with added sugar. Sweets and desserts Frosting. Pudding. Cookies. Cakes. Pies. Milk chocolate or white chocolate. Buttered syrups. Full-fat ice cream or ice cream drinks. The items listed above may not be a complete list of foods and drinks to avoid. Contact a dietitian for more information. Summary Heart-healthy meal planning includes eating less unhealthy fats, eating more healthy fats, and making other changes in your diet. Eat a balanced diet. This includes fruits and vegetables, low-fat or nonfat dairy, lean protein, nuts and legumes, whole grains, and heart-healthy oils and fats. This information is not intended to replace advice given to you by your health care provider. Make sure you discuss any questions you have with your health care provider. Document Revised: 08/07/2021 Document Reviewed: 08/07/2021 Elsevier Patient Education  [place]. Hypoglycemia Hypoglycemia occurs when the level of sugar (glucose) in the blood is too low. Hypoglycemia can happen in people who have or do not have diabetes. It can develop quickly, and it can be a medical emergency. For most people, a blood glucose level below 70 mg/dL (3.9 mmol/L) is considered hypoglycemia. Glucose is a type of sugar that provides the body's main source of energy. Certain hormones (insulin and glucagon) control the level of glucose in the blood. Insulin lowers blood glucose, and  glucagon raises blood glucose. Hypoglycemia can result from having too much insulin in the bloodstream, or from not eating enough food that contains glucose. You may also have reactive hypoglycemia, which happens within 4 hours after eating a meal. What are the causes? Hypoglycemia occurs most often in people who have diabetes and may be caused by: Diabetes medicine. Not eating enough, or not eating often enough. Increased physical activity. Drinking alcohol on an empty stomach. If you do not have diabetes, hypoglycemia may be caused by: A tumor in the pancreas. Not eating enough, or not eating for long periods at a time (fasting). A severe infection or illness. Problems after having bariatric surgery. Organ failure, such as kidney or liver failure. Certain medicines. What increases the risk? Hypoglycemia is more likely to develop in people who: Have diabetes and take medicines to lower blood glucose. Abuse alcohol. Have a severe illness. What are the signs or symptoms? Symptoms vary depending on whether the condition is mild, moderate, or severe. Mild hypoglycemia Hunger. Sweating and feeling clammy.  Dizziness or feeling light-headed. Sleepiness or restless sleep. Nausea. Increased heart rate. Headache. Blurry vision. Mood changes, such as irritability or anxiety. Tingling or numbness around the mouth, lips, or tongue. Moderate hypoglycemia Confusion and poor judgment. Behavior changes. Weakness. Irregular heartbeat. A change in coordination. Severe hypoglycemia Severe hypoglycemia is a medical emergency. It can cause: Fainting. Seizures. Loss of consciousness (coma). Death. How is this diagnosed? Hypoglycemia is diagnosed with a blood test to measure your blood glucose level. This blood test is done while you are having symptoms. Your health care provider may also do a physical exam and review your medical history. How is this treated? This condition can be treated by  immediately eating or drinking something that contains sugar with 15 grams of fast-acting carbohydrate, such as: 4 oz (120 mL) of fruit juice. 4 oz (120 mL) of regular soda (not diet soda). Several pieces of hard candy. Check food labels to find out how many pieces to eat for 15 grams. 1 Tbsp (15 mL) of sugar or honey. 4 glucose tablets. 1 tube of glucose gel. Treating hypoglycemia if you have diabetes If you are alert and able to swallow safely, follow the 15:15 rule: Take 15 grams of a fast-acting carbohydrate. Talk with your health care provider about how much you should take. Options for getting 15 grams of fast-acting carbohydrate include: Glucose tablets (take 4 tablets). Several pieces of hard candy. Check food labels to find out how many pieces to eat for 15 grams. 4 oz (120 mL) of fruit juice. 4 oz (120 mL) of regular soda (not diet soda). 1 Tbsp (15 mL) of sugar or honey. 1 tube of glucose gel. Check your blood glucose 15 minutes after you take the carbohydrate. If the repeat blood glucose level is still at or below 70 mg/dL (3.9 mmol/L), take 15 grams of a carbohydrate again. If your blood glucose level does not increase above 70 mg/dL (3.9 mmol/L) after 3 tries, seek emergency medical care. After your blood glucose level returns to normal, eat a meal or a snack within 1 hour.  Treating severe hypoglycemia Severe hypoglycemia is when your blood glucose level is below 54 mg/dL (3 mmol/L). Severe hypoglycemia is a medical emergency. Get medical help right away. If you have severe hypoglycemia and you cannot eat or drink, you will need to be given glucagon. A family member or close friend should learn how to check your blood glucose and how to give you glucagon. Ask your health care provider if you need to have an emergency glucagon kit available. Severe hypoglycemia may need to be treated in a hospital. The treatment may include getting glucose through an IV. You may also need  treatment for the cause of your hypoglycemia. Follow these instructions at home:  General instructions Take over-the-counter and prescription medicines only as told by your health care provider. Monitor your blood glucose as told by your health care provider. If you drink alcohol: Limit how much you have to: 0-1 drink a day for women who are not pregnant. 0-2 drinks a day for men. Know how much alcohol is in your drink. In the [place] one drink equals one 12 oz bottle of beer (355 mL), one 5 oz glass of wine (148 mL), or one 1 oz glass of hard liquor (44 mL). Be sure to eat food along with drinking alcohol. Be aware that alcohol is absorbed quickly and may have lingering effects that may result in hypoglycemia later. Be sure to do ongoing glucose  monitoring. Keep all follow-up visits. This is important. If you have diabetes: Always have a fast-acting carbohydrate (15 grams) option with you to treat low blood glucose. Follow your diabetes management plan as directed by your health care provider. Make sure you: Know the symptoms of hypoglycemia. It is important to treat it right away to prevent it from becoming severe. Check your blood glucose as often as told. Always check before and after exercise. Always check your blood glucose before you drive a motorized vehicle. Take your medicines as told. Follow your meal plan. Eat on time, and do not skip meals. Share your diabetes management plan with people in your workplace, school, and household. Carry a medical alert card or wear medical alert jewelry. Where to find more information American Diabetes Association: www.diabetes.org Contact a health care provider if: You have problems keeping your blood glucose in your target range. You have frequent episodes of hypoglycemia. Get help right away if: You continue to have hypoglycemia symptoms after eating or drinking something that contains 15 grams of fast-acting carbohydrate, and you cannot  get your blood glucose above 70 mg/dL (3.9 mmol/L) while following the 15:15 rule. Your blood glucose is below 54 mg/dL (3 mmol/L). You have a seizure. You faint. These symptoms may represent a serious problem that is an emergency. Do not wait to see if the symptoms will go away. Get medical help right away. Call your local emergency services (911 in the [place] Do not drive yourself to the hospital. Summary Hypoglycemia occurs when the level of sugar (glucose) in the blood is too low. Hypoglycemia can happen in people who have or do not have diabetes. It can develop quickly, and it can be a medical emergency. Make sure you know the symptoms of hypoglycemia and how to treat it. Always have a fast-acting carbohydrate option with you to treat low blood sugar. This information is not intended to replace advice given to you by your health care provider. Make sure you discuss any questions you have with your health care provider. Document Revised: 06/02/2020 Document Reviewed: 06/02/2020 Elsevier Patient Education  [place].

## 2022-07-13 ENCOUNTER — Telehealth: Payer: Self-pay

## 2022-07-15 DIAGNOSIS — I700 Atherosclerosis of aorta: Secondary | ICD-10-CM

## 2022-07-15 DIAGNOSIS — E1159 Type 2 diabetes mellitus with other circulatory complications: Secondary | ICD-10-CM | POA: Diagnosis not present

## 2022-07-23 ENCOUNTER — Telehealth: Payer: Self-pay

## 2022-07-23 NOTE — Progress Notes (Cosign Needed Addendum)
  07-23-2022: patient was informed to bring 2024 SSI statement for Ozempic application. Patient's initial visit with Orlando Penner was also rescheduled to 07-27-2022.  [place] Pharmacist Assistant [phone]

## 2022-07-24 ENCOUNTER — Telehealth: Payer: Self-pay

## 2022-07-24 NOTE — Progress Notes (Signed)
Care Management & Coordination Services Pharmacy Team  Reason for Encounter: Appointment Reminder  Contacted patient on 07/24/2022   Recent office visits:  07-12-2022 Kassie Mends, RN (CCM).  02-27-2022 Glendale Chard, MD. Glucose= 101. A1C= 6.5   Recent consult visits:  05-28-2022 Lowella Grip ([place]). Follow up visit.   03-05-2022 Shamleffer, Melanie Crazier, MD (Endo). START gabapentin 300 mg at bedtime. COMPLETED augmentin, glipizide and dicyclomine.   Hospital visits:  None in previous 6 months  Medications: Outpatient Encounter Medications as of 07/24/2022  Medication Sig Note   amoxicillin-clavulanate (AUGMENTIN) 875-125 MG tablet Take 1 tablet by mouth 2 (two) times daily. (Patient not taking: Reported on 07/12/2022)    azelastine (ASTELIN) 0.1 % nasal spray Place 2 sprays into both nostrils 2 (two) times daily. Use in each nostril as directed    Continuous Blood Gluc Receiver (FREESTYLE LIBRE 2 READER) DEVI 1 Device by Does not apply route as directed.    Continuous Blood Gluc Sensor (FREESTYLE LIBRE 2 SENSOR) MISC USE AS DIRECTED EVERY 2 WEEKS    EPINEPHrine 0.3 mg/0.3 mL IJ SOAJ injection Inject 0.3 mg into the muscle as needed for anaphylaxis. 04/19/2021: On hand   fluticasone (FLONASE) 50 MCG/ACT nasal spray Place 2 sprays into both nostrils daily.    gabapentin (NEURONTIN) 300 MG capsule Take 1 capsule (300 mg total) by mouth at bedtime.    HYDROcodone-acetaminophen (NORCO/VICODIN) 5-325 MG tablet Take 2 tablets by mouth every 4 (four) hours as needed. (Patient not taking: Reported on 07/12/2022)    latanoprost (XALATAN) 0.005 % ophthalmic solution Place 1 drop into both eyes daily.    levocetirizine (XYZAL) 5 MG tablet SMARTSIG:1 Tablet(s) By Mouth Every Evening    levothyroxine (SYNTHROID) 112 MCG tablet Take 1 tablet by mouth once daily    lisinopril (ZESTRIL) 5 MG tablet Take 1 tablet by mouth once daily    loperamide (IMODIUM) 2 MG  capsule Take 1 capsule (2 mg total) by mouth 4 (four) times daily as needed for diarrhea or loose stools.    montelukast (SINGULAIR) 10 MG tablet Take 1 tablet (10 mg total) by mouth daily. (Patient not taking: Reported on 07/12/2022)    omeprazole (PRILOSEC) 40 MG capsule Take 1 capsule (40 mg total) by mouth daily.    ondansetron (ZOFRAN ODT) 4 MG disintegrating tablet Take 1 tablet (4 mg total) by mouth every 8 (eight) hours as needed for nausea or vomiting. (Patient not taking: Reported on 07/12/2022)    ONETOUCH VERIO test strip USE STRIP TO CHECK BLOOD SUGAR UP TO 4 TIMES PER DAY 07/09/2020: Home usage    OZEMPIC, 0.25 OR 0.5 MG/DOSE, 2 MG/3ML SOPN INJECT  0.5 MG SUBCUTANEOUSLY ONCE A WEEK    promethazine (PHENERGAN) 12.5 MG tablet Take 1 tablet (12.5 mg total) by mouth every 6 (six) hours as needed for nausea or vomiting.    pseudoephedrine (SUDAFED) 30 MG tablet Take 60 mg by mouth 2 (two) times daily as needed for congestion. 07/09/2020: Uses PRN   rosuvastatin (CRESTOR) 20 MG tablet Take 1 tablet (20 mg total) by mouth daily.    tadalafil (CIALIS) 5 MG tablet Take 5 mg by mouth daily. 07/09/2020: Uses PRN   testosterone enanthate (DELATESTRYL) 200 MG/ML injection Inject 200 mg into the muscle every 14 (fourteen) days. (Patient not taking: Reported on 07/12/2022) 07/09/2020: Hasn't been doing Injection since not feeling well, Past Month    timolol (TIMOPTIC) 0.5 % ophthalmic solution Place 1 drop into both eyes  daily.    Turmeric 500 MG CAPS Take 500 mg by mouth daily.    vitamin C (ASCORBIC ACID) 500 MG tablet Take 1,000 mg by mouth daily. (Patient not taking: Reported on 07/12/2022)    Vitamin D, Ergocalciferol, (DRISDOL) 1.25 MG (50000 UNIT) CAPS capsule Take 1 capsule (50,000 Units total) by mouth See admin instructions. Take one capsule by mouth on Tues/Fridays    No facility-administered encounter medications on file as of 07/24/2022.   Lab Results  Component Value Date/Time   HGBA1C  6.5 (H) 02/27/2022 10:33 AM   HGBA1C 8.0 (H) 10/26/2021 10:41 AM   MICROALBUR 80 06/28/2021 12:06 PM   MICROALBUR 80 06/20/2020 12:46 PM    BP Readings from Last 3 Encounters:  03/05/22 122/80  02/27/22 108/80  11/19/21 (!) 147/97       Patient contacted to confirm telephone appointment with Orlando Penner PharmD, on 07-27-2022 at 1:00.  Do you have any problems getting your medications? Yes If yes what types of problems are you experiencing? Financial barriers Patient assistance is needed for Ozempic. Application completed and uploaded to mail. Patient is aware to take application and 9833 SSI statement to next Endocrinology appointment.  What is your top health concern you would like to discuss at your upcoming visit? Patient stated medication maintenance.  Have you seen any other providers since your last visit with PCP? No  Star Rating Drugs:  Ozempic 0.5 mg- Last filled 01-13-2022 [facility]  Care Gaps: Annual wellness visit in last year? Yes Covid vaccine overdue Shingrix overdue Tdap overdue Uacr overdue  If Diabetic: Last eye exam / retinopathy screening: 12-19-2021 Last diabetic foot exam: None patient stated PCP and Endo examines feet

## 2022-07-27 ENCOUNTER — Other Ambulatory Visit: Payer: Self-pay | Admitting: Internal Medicine

## 2022-07-27 ENCOUNTER — Ambulatory Visit: Payer: Medicare Other

## 2022-07-27 NOTE — Progress Notes (Signed)
Care Management & Coordination Services Pharmacy Note  07/27/2022 Name:  Connor Becker. MRN:  [id number] DOB:  1951/03/18  Summary: Patient reports that he is doing well   Recommendations/Changes made from today's visit: Recommend he increase his current statin medication due to history of atherosclerosis and diabetes.  Recommend increasing the amount of fried   Follow up plan: He would like to continue to lose weight and progress in his health.  Patient would prefer to increase the Rosuvastatin 40 mg tablet and then the follow up labS to be completed 6-8 weeks after increasing the medication.  Goal to eat three home cooked dinners per week for the next 30 days.  He is going to add one more bottle of water and 10 minutes per week for the next 90 days.    Subjective: Connor Candy. is an 72 y.o. year old male who is a primary patient of Glendale Chard, MD.  The care coordination team was consulted for assistance with disease management and care coordination needs.  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 He is semi-retired and his father lived to 4 and mother to 6. He received a referral who he worked for and they needed a Management consultant. 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 He has been married to Ms. Fricke for 20 years.   Engaged with patient by telephone for initial visit.  Recent office visits:  07-12-2022 Connor Mends, RN (CCM).   02-27-2022 Glendale Chard, MD. Glucose= 101. A1C= 6.5    Recent consult visits:  05-28-2022 Lowella Grip ([place]). Follow up visit.         03-05-2022 Shamleffer, Melanie Crazier, MD (Endo). START gabapentin 300 mg at bedtime. COMPLETED augmentin, glipizide and dicyclomine.   Hospital visits: None in previous 6 months   Objective:  Lab Results  Component  Value Date   CREATININE 1.22 02/27/2022   BUN 16 02/27/2022   GFR 76.52 04/06/2016   EGFR 63 02/27/2022   GFRNONAA >60 03/18/2021   GFRAA 82 06/20/2020   NA 137 02/27/2022   K 4.8 02/27/2022   CALCIUM 9.6 02/27/2022   CO2 21 02/27/2022   GLUCOSE 101 (H) 02/27/2022    Lab Results  Component Value Date/Time   HGBA1C 6.5 (H) 02/27/2022 10:33 AM   HGBA1C 8.0 (H) 10/26/2021 10:41 AM   GFR 76.52 04/06/2016 04:38 PM   GFR 83.03 02/01/2016 03:31 PM   MICROALBUR 80 06/28/2021 12:06 PM   MICROALBUR 80 06/20/2020 12:46 PM    Last diabetic Eye exam:  Lab Results  Component Value Date/Time   HMDIABEYEEXA No Retinopathy 12/19/2021 12:00 AM    Last diabetic Foot exam: No results found for: "HMDIABFOOTEX"   Lab Results  Component Value Date   CHOL 144 02/27/2022   HDL 42 02/27/2022   LDLCALC 79 02/27/2022   LDLDIRECT 148.0 11/17/2015   TRIG 131 02/27/2022   CHOLHDL 3.4 02/27/2022       Latest Ref Rng & Units 02/27/2022   10:33 AM 10/26/2021   10:41 AM 06/28/2021   10:57 AM  Hepatic Function  Total Protein 6.0 - 8.5 g/dL 6.8  7.4  7.3   Albumin 3.8 - 4.8 g/dL 4.4  4.3  4.4   AST 0 - 40 IU/L '23  23  21   '$ ALT 0 -  44 IU/L '25  25  19   '$ Alk Phosphatase 44 - 121 IU/L 71  79  88   Total Bilirubin 0.0 - 1.2 mg/dL 0.4  0.5  0.6     Lab Results  Component Value Date/Time   TSH 2.220 10/26/2021 10:41 AM   TSH 3.240 06/28/2021 10:57 AM   FREET4 1.52 02/21/2021 12:08 PM   FREET4 1.60 03/31/2020 10:14 AM       Latest Ref Rng & Units 02/27/2022   10:33 AM 06/28/2021   10:57 AM 03/18/2021    8:50 AM  CBC  WBC 3.4 - 10.8 x10E3/uL 8.7  10.3  12.7   Hemoglobin 13.0 - 17.7 g/dL 15.5  15.8  16.7   Hematocrit 37.5 - 51.0 % 46.6  47.3  49.8   Platelets 150 - 450 x10E3/uL 251  270  266     Lab Results  Component Value Date/Time   VD25OH 26.7 (L) 06/20/2020 04:07 PM   VD25OH 19.0 (L) 10/07/2019 03:55 PM   VITAMINB12 408 10/26/2021 10:41 AM   VITAMINB12 455 04/06/2016 04:38 PM     Clinical ASCVD: Yes  The 10-year ASCVD risk score (Arnett DK, et al., 2019) is: 38.1%   Values used to calculate the score:     Age: 35 years     Sex: Male     Is Non-Hispanic African American: No     Diabetic: Yes     Tobacco smoker: No     Systolic Blood Pressure: 151 mmHg     Is BP treated: Yes     HDL Cholesterol: 42 mg/dL     Total Cholesterol: 144 mg/dL       07/12/2022    2:27 PM 11/01/2021    3:42 PM 10/26/2021    9:58 AM  Depression screen PHQ 2/9  Decreased Interest 0 0 0  Down, Depressed, Hopeless 0 0 0  PHQ - 2 Score 0 0 0     Social History   Tobacco Use  Smoking Status Never  Smokeless Tobacco Never   BP Readings from Last 3 Encounters:  03/05/22 122/80  02/27/22 108/80  11/19/21 (!) 147/97   Pulse Readings from Last 3 Encounters:  03/05/22 73  02/27/22 (!) 59  11/19/21 85   Wt Readings from Last 3 Encounters:  03/05/22 279 lb (126.6 kg)  02/27/22 276 lb 9.6 oz (125.5 kg)  11/19/21 288 lb (130.6 kg)   BMI Readings from Last 3 Encounters:  03/05/22 36.81 kg/m  02/27/22 36.49 kg/m  11/19/21 38.00 kg/m    Allergies  Allergen Reactions   Horseradish [Armoracia Rusticana Ext (Horseradish)]    Lipitor [Atorvastatin] Other (See Comments)    Leg cramps   Metformin And Related Other (See Comments)    GI upset   Semaglutide(0.25 Or 0.'5mg'$ -Dos) Nausea And Vomiting   Soy Allergy Other (See Comments)    Pt stated (SOY SAUCE) causes angioedema, but has added some soy products back in to diet and no angioedema.     Medications Reviewed Today     Reviewed by Connor Mends, RN (Registered Nurse) on 07/12/22 at 56  Med List Status: <None>   Medication Order Taking? Sig Documenting Provider Last Dose Status Informant  amoxicillin-clavulanate (AUGMENTIN) 875-125 MG tablet [id number] No Take 1 tablet by mouth 2 (two) times daily.  Patient not taking: Reported on 07/12/2022   Mar Daring, PA-C Not Taking Active   azelastine (ASTELIN)  0.1 % nasal spray [id number] Yes Place 2 sprays into  both nostrils 2 (two) times daily. Use in each nostril as directed Glendale Chard, MD Taking Active   Continuous Blood Gluc Receiver (FREESTYLE LIBRE 2 READER) DEVI [id number] Yes 1 Device by Does not apply route as directed. Shamleffer, Melanie Crazier, MD Taking Active   Continuous Blood Gluc Sensor (FREESTYLE LIBRE 2 SENSOR) [place] [id number] Yes USE AS DIRECTED EVERY 2 WEEKS Shamleffer, Melanie Crazier, MD Taking Active   EPINEPHrine 0.3 mg/0.3 mL IJ SOAJ injection [id number] Yes Inject 0.3 mg into the muscle as needed for anaphylaxis. Bary Castilla, NP Taking Active            Med Note (DAVIS, SOPHIA A   Wed Apr 19, 2021  1:53 PM) On hand  fluticasone (FLONASE) 50 MCG/ACT nasal spray [id number] Yes Place 2 sprays into both nostrils daily. Mar Daring, PA-C Taking Active   gabapentin (NEURONTIN) 300 MG capsule [id number] Yes Take 1 capsule (300 mg total) by mouth at bedtime. Shamleffer, Melanie Crazier, MD Taking Active   HYDROcodone-acetaminophen (NORCO/VICODIN) 5-325 MG tablet [id number] No Take 2 tablets by mouth every 4 (four) hours as needed.  Patient not taking: Reported on 07/12/2022   Volanda Napoleon, PA-C Not Taking Active   latanoprost (XALATAN) 0.005 % ophthalmic solution [id number] Yes Place 1 drop into both eyes daily. [provider] Taking Active Spouse/Significant Other  levocetirizine (XYZAL) 5 MG tablet [id number] Yes SMARTSIG:1 Tablet(s) By Mouth Every Evening [provider] Taking Active   levothyroxine (SYNTHROID) 112 MCG tablet [id number] Yes Take 1 tablet by mouth once daily Glendale Chard, MD Taking Active   lisinopril (ZESTRIL) 5 MG tablet [id number] Yes Take 1 tablet by mouth once daily Glendale Chard, MD Taking Active   loperamide (IMODIUM) 2 MG capsule [id number] Yes Take 1 capsule (2 mg total) by mouth 4 (four) times daily as needed for diarrhea or loose stools. Caccavale, Sophia, PA-C Taking  Active   montelukast (SINGULAIR) 10 MG tablet [id number] No Take 1 tablet (10 mg total) by mouth daily.  Patient not taking: Reported on 07/12/2022   Glendale Chard, MD Not Taking Active   omeprazole (PRILOSEC) 40 MG capsule [id number] Yes Take 1 capsule (40 mg total) by mouth daily. Ladene Artist, MD Taking Active   ondansetron (ZOFRAN ODT) 4 MG disintegrating tablet [id number] No Take 1 tablet (4 mg total) by mouth every 8 (eight) hours as needed for nausea or vomiting.  Patient not taking: Reported on 07/12/2022   Sherrill Raring, PA-C Not Taking Active   [facility] VERIO test strip [id number] Yes USE STRIP TO CHECK BLOOD SUGAR UP TO 4 TIMES PER DAY Glendale Chard, MD Taking Active Spouse/Significant Other           Med Note Virgel Paling A   Sat Jul 09, 2020 10:55 PM) Home usage   OZEMPIC, 0.25 OR 0.5 MG/DOSE, 2 MG/3ML SOPN [id number] Yes INJECT  0.5 MG SUBCUTANEOUSLY ONCE A WEEK Shamleffer, Melanie Crazier, MD Taking Active   promethazine (PHENERGAN) 12.5 MG tablet [id number] Yes Take 1 tablet (12.5 mg total) by mouth every 6 (six) hours as needed for nausea or vomiting. Volanda Napoleon, PA-C Taking Active   pseudoephedrine (SUDAFED) 30 MG tablet [id number] Yes Take 60 mg by mouth 2 (two) times daily as needed for congestion. [provider] Taking Active Spouse/Significant Other           Med Note Vikki Ports   [facility] Jul 09, 2020 10:54 PM) Uses PRN  rosuvastatin (CRESTOR) 20 MG tablet [id number] Yes Take  1 tablet (20 mg total) by mouth daily. Shamleffer, Melanie Crazier, MD Taking Active   tadalafil (CIALIS) 5 MG tablet [id number] Yes Take 5 mg by mouth daily. [provider] Taking Active Spouse/Significant Other           Med Note Virgel Paling A   Sat Jul 09, 2020 10:53 PM) Uses PRN  testosterone enanthate (DELATESTRYL) 200 MG/ML injection [id number] No Inject 200 mg into the muscle every 14 (fourteen) days.  Patient not taking: Reported on 07/12/2022   [provider] Not Taking Active Spouse/Significant Other           Med Note Virgel Paling A   Sat Jul 09, 2020 10:54 PM) Hasn't been doing Injection since not feeling well, Past Month   timolol (TIMOPTIC) 0.5 % ophthalmic solution [id number] Yes Place 1 drop into both eyes daily. [provider] Taking Active Spouse/Significant Other  Turmeric 500 MG CAPS [id number] Yes Take 500 mg by mouth daily. [provider] Taking Active Spouse/Significant Other  vitamin C (ASCORBIC ACID) 500 MG tablet [id number] No Take 1,000 mg by mouth daily.  Patient not taking: Reported on 07/12/2022   [provider] Not Taking Active Spouse/Significant Other  Vitamin D, Ergocalciferol, (DRISDOL) 1.25 MG (50000 UNIT) CAPS capsule [id number] Yes Take 1 capsule (50,000 Units total) by mouth See admin instructions. Take one capsule by mouth on Tues/Fridays Glendale Chard, MD Taking Active             SDOH:  (Social Determinants of Health) assessments and interventions performed: No SDOH Interventions    Flowsheet Row Chronic Care Management from 07/12/2022 in [facility] Clinical Support from 10/07/2019 in [place] from 03/31/2019 in [place] Interventions Intervention Not Indicated -- --  Housing Interventions Intervention Not Indicated -- --  Utilities Interventions Intervention Not Indicated -- --  Depression Interventions/Treatment  -- PHQ2-9 Score <4 Follow-up Not Indicated PHQ2-9 Score <4 Follow-up Not Indicated  Financial Strain Interventions Intervention Not Indicated -- --  Physical Activity Interventions Patient Refused -- --  Stress Interventions Intervention Not Indicated -- --  Social Connections Interventions Intervention Not Indicated, Patient Refused -- --      [place]: No Food Insecurity (07/12/2022)   Housing: Low Risk  (07/12/2022)  Transportation Needs: No Transportation Needs (11/01/2021)  Utilities: Not At Risk (07/12/2022)  Depression (PHQ2-9): Low Risk  (07/12/2022)  Financial Resource Strain: Low Risk  (07/12/2022)  Physical Activity: Inactive (07/12/2022)  Social Connections: Moderately Isolated (07/12/2022)  Stress: No Stress Concern Present (07/12/2022)  Tobacco Use: Low Risk  (07/12/2022)    Medication Assistance: Application for Ozempic  medication assistance program. in process.  Anticipated assistance start date 08/2022.  See plan of care for additional detail.  Medication Access: Within the past 30 days, how often has patient missed a dose of medication? He has missed his medication sometimes, 80-90% Is a pillbox or other method used to improve adherence? No  He has them by his bed  Factors that may affect medication adherence? nonadherence to medications and lack of understanding of disease management Are meds synced by current pharmacy? No  Are meds delivered by current pharmacy? No  Does patient experience delays in picking up medications due to transportation concerns? No   [facility] Services Reviewed: Is patient disadvantaged to use [facility]?: No  Current Rx insurance plan: [facility] Medicare Name and location  of Current pharmacy:  [place], [place] - [address] [place] [address] Phone: [phone] Fax: [phone]  [facility] services reviewed with patient today?: No  Patient requests to transfer care to [facility]?: No  Reason patient declined to change pharmacies: Concern over cost/pricing differences [facility] + is a program where the rates might be better then insurance.   Compliance/Adherence/Medication fill history: Care Gaps: COVID-19 Vaccine  Shingrix Vaccine Pneumonia Vaccine  DTaP/Tdap/Td Diabetic Kidney Evaluation   Star-Rating Drugs: Lisinopril 5 mg tablet Ozempic  0.5 mg tablet Rosuvastatin 20 mg tablet   Assessment/Plan   Hyperlipidemia: (LDL goal < 55) -Uncontrolled -Current treatment: Rosuvastatin 20 mg tablet once per day Appropriate, Query effective -Current dietary patterns: he has not changed the amount of fried fatty foods that he is eating  -Current exercise habits: he does a lot of  -Educated on Cholesterol goals;  Benefits of statin for ASCVD risk reduction; Importance of limiting foods high in cholesterol; -Recommended to continue current medication    Diabetes (A1c goal <7%) -Controlled -Current medications: Ozempic 0.5 mg once per week Appropriate, Effective, Safe, Query accessible -Current home glucose readings - he is checking his BS at home using the [facility] and finger stick method. 120-140, his current 7-day average is 140, his 30 day and 90 day range are in the green 90 percent of the time. -Denies hypoglycemic/hyperglycemic symptoms -Current meal patterns:  breakfast: Banana, BLT lunch: 2 piece of pizza  snacks: ice cream drumsticks, chips,  drinks: he is drinking 3-4 bottles of water per day -Current exercise: 4 to 5 bottles of water per day, he is currently drinking 2-3 bottles of water. If he is busy he is drinking 5-6 bottles of water per day, and if he is more active he is going to drink more water daily. In the last 30 days he has been drinking 2-4 bottles of water per day.  -Educated on Complications of diabetes including kidney damage, retinal damage, and cardiovascular disease; -He is aware to check his BS using the fingerprick method if needed if there is a low BS reading -We discussed the options of treating low BS readings and the signs of symptoms of high BS readings  -He is aware of the importance of eating something every 3-4 hours  -Counseled to check feet daily and get yearly eye exams -Collaborated with Mr. And Mrs. Scobie to talk about quick acting  Assessed patient finances. Patient would  like   Orlando Penner, CPP, PharmD Clinical Pharmacist Practitioner [facility] [phone]

## 2022-08-01 NOTE — Progress Notes (Signed)
I,[name],acting as a scribe for Maximino Greenland, MD.,have documented all relevant documentation on the behalf of Maximino Greenland, MD,as directed by  Maximino Greenland, MD while in the presence of Maximino Greenland, MD.   Subjective:     Patient ID: Connor Becker , male    DOB: 13-Oct-1950 , 72 y.o.   MRN: [id number]   Chief Complaint  Patient presents with   Annual Exam   Diabetes   Hypertension    HPI  He is here today for a full physical examination. He is followed by Urology for his prostate exams. He has no specific concerns or complaints at this time.  Diabetes He presents for his follow-up diabetic visit. He has type 2 diabetes mellitus. There are no hypoglycemic associated symptoms. There are no diabetic associated symptoms. There are no hypoglycemic complications. Risk factors for coronary artery disease include diabetes mellitus, dyslipidemia, male sex, sedentary lifestyle and obesity. He is compliant with treatment most of the time. He is following a diabetic diet. He participates in exercise intermittently. His breakfast blood glucose is taken between 8-9 am. His breakfast blood glucose range is generally 130-140 mg/dl. Eye exam is not current.     Past Medical History:  Diagnosis Date   ALLERGIC RHINITIS    Chronic pain of both knees    DJD (degenerative joint disease) of knee    right   DM2 (diabetes mellitus, type 2) (HCC)    GERD    Glaucoma    History of nuclear stress test    Myoview 12/17: EF 53, no ischemia, Low Risk   HYPERLIPIDEMIA    myalgias from Lipitor   HYPOGONADISM, MALE    Hypothyroidism    INSOMNIA-SLEEP DISORDER-UNSPEC    Joint pain    Obesity    OSTEOARTHRITIS    SLEEP APNEA, OBSTRUCTIVE    no CPAP; repeat sleep test pending 07/2016   Unspecified hypothyroidism 12/04/2013   Vitamin D deficiency      Family History  Problem Relation Age of Onset   Heart attack Mother 25       s/p MI   Other Mother        pacemaker    Diabetes Mother    High blood pressure Mother    High Cholesterol Mother    Thyroid disease Mother    Obesity Mother    Macular degeneration Father    Thyroid disease Father    Diabetes Maternal Uncle    Heart attack Maternal Uncle    Hypertension Other    Prostate cancer Other    Heart disease Other    Colon cancer Neg Hx      Current Outpatient Medications:    albuterol (VENTOLIN HFA) 108 (90 Base) MCG/ACT inhaler, Inhale 2 puffs into the lungs every 6 (six) hours as needed for wheezing or shortness of breath., Disp: 8 g, Rfl: 2   azelastine (ASTELIN) 0.1 % nasal spray, Place 2 sprays into both nostrils 2 (two) times daily. Use in each nostril as directed, Disp: 30 mL, Rfl: 2   Continuous Blood Gluc Receiver (FREESTYLE LIBRE 2 READER) DEVI, 1 Device by Does not apply route as directed., Disp: 1 each, Rfl: 0   EPINEPHrine 0.3 mg/0.3 mL IJ SOAJ injection, Inject 0.3 mg into the muscle as needed for anaphylaxis., Disp: 1 each, Rfl: 0   fluticasone (FLONASE) 50 MCG/ACT nasal spray, Place 2 sprays into both nostrils daily., Disp: 16 g, Rfl: 0   latanoprost (XALATAN)  0.005 % ophthalmic solution, Place 1 drop into both eyes daily., Disp: , Rfl:    levocetirizine (XYZAL) 5 MG tablet, SMARTSIG:1 Tablet(s) By Mouth Every Evening, Disp: , Rfl:    levothyroxine (SYNTHROID) 112 MCG tablet, Take 1 tablet by mouth once daily, Disp: 90 tablet, Rfl: 0   lisinopril (ZESTRIL) 5 MG tablet, Take 1 tablet by mouth once daily, Disp: 90 tablet, Rfl: 0   loperamide (IMODIUM) 2 MG capsule, Take 1 capsule (2 mg total) by mouth 4 (four) times daily as needed for diarrhea or loose stools., Disp: 12 capsule, Rfl: 0   omeprazole (PRILOSEC) 40 MG capsule, Take 1 capsule (40 mg total) by mouth daily., Disp: 60 capsule, Rfl: 5   ONETOUCH VERIO test strip, USE  STRIP TO CHECK GLUCOSE 4 TIMES DAILY, Disp: 200 each, Rfl: 0   promethazine (PHENERGAN) 12.5 MG tablet, Take 1 tablet (12.5 mg total) by mouth every 6 (six)  hours as needed for nausea or vomiting., Disp: 6 tablet, Rfl: 0   pseudoephedrine (SUDAFED) 30 MG tablet, Take 60 mg by mouth 2 (two) times daily as needed for congestion., Disp: , Rfl:    timolol (TIMOPTIC) 0.5 % ophthalmic solution, Place 1 drop into both eyes daily., Disp: , Rfl: 2   Turmeric 500 MG CAPS, Take 500 mg by mouth daily., Disp: , Rfl:    Vitamin D, Ergocalciferol, (DRISDOL) 1.25 MG (50000 UNIT) CAPS capsule, Take 1 capsule (50,000 Units total) by mouth See admin instructions. Take one capsule by mouth on Tues/Fridays, Disp: 24 capsule, Rfl: 0   amoxicillin-clavulanate (AUGMENTIN) 875-125 MG tablet, Take 1 tablet by mouth 2 (two) times daily., Disp: 14 tablet, Rfl: 0   Continuous Blood Gluc Sensor (FREESTYLE LIBRE 2 SENSOR) MISC, 1 Device by Other route every 14 (fourteen) days., Disp: 6 each, Rfl: 3   gabapentin (NEURONTIN) 300 MG capsule, Take 1 capsule (300 mg total) by mouth at bedtime., Disp: 90 capsule, Rfl: 3   Gabapentin 10 % CREA, Apply 1 Application topically at bedtime., Disp: 30 g, Rfl: 3   HYDROcodone-acetaminophen (NORCO/VICODIN) 5-325 MG tablet, Take 2 tablets by mouth every 4 (four) hours as needed., Disp: 6 tablet, Rfl: 0   montelukast (SINGULAIR) 10 MG tablet, Take 1 tablet (10 mg total) by mouth daily., Disp: 30 tablet, Rfl: 2   ondansetron (ZOFRAN ODT) 4 MG disintegrating tablet, Take 1 tablet (4 mg total) by mouth every 8 (eight) hours as needed for nausea or vomiting., Disp: 20 tablet, Rfl: 0   rosuvastatin (CRESTOR) 20 MG tablet, Take 1 tablet (20 mg total) by mouth daily., Disp: 90 tablet, Rfl: 3   Semaglutide,0.25 or 0.'5MG'$ /DOS, 2 MG/3ML SOPN, Inject 0.5 mg into the skin once a week., Disp: 9 mL, Rfl: 3   tadalafil (CIALIS) 5 MG tablet, Take 5 mg by mouth daily., Disp: , Rfl: 11   testosterone enanthate (DELATESTRYL) 200 MG/ML injection, Inject 200 mg into the muscle every 14 (fourteen) days., Disp: , Rfl:    vitamin C (ASCORBIC ACID) 500 MG tablet, Take 1,000 mg  by mouth daily., Disp: , Rfl:    Allergies  Allergen Reactions   Horseradish [Armoracia Rusticana Ext (Horseradish)]    Lipitor [Atorvastatin] Other (See Comments)    Leg cramps   Metformin And Related Other (See Comments)    GI upset   Semaglutide(0.25 Or 0.'5mg'$ -Dos) Nausea And Vomiting   Soy Allergy Other (See Comments)    Pt stated (SOY SAUCE) causes angioedema, but has added some soy products back  in to diet and no angioedema.      Men's preventive visit. Patient Health Questionnaire (PHQ-2) is  [facility] Office Visit from 08/02/2022 in [facility]  PHQ-2 Total Score 0     . Patient is on a diabetic diet. Marital status: Married. Relevant history for alcohol use is:  Social History   Substance and Sexual Activity  Alcohol Use Not Currently   Comment: occassional beer  . Relevant history for tobacco use is:  Social History   Tobacco Use  Smoking Status Never  Smokeless Tobacco Never  .   Review of Systems  Constitutional: Negative.   HENT: Negative.    Eyes: Negative.   Respiratory: Negative.    Cardiovascular: Negative.   Gastrointestinal: Negative.   Endocrine: Negative.   Genitourinary: Negative.   Musculoskeletal: Negative.   Skin: Negative.   Neurological: Negative.   Hematological: Negative.   Psychiatric/Behavioral: Negative.       Today's Vitals   08/02/22 1055  BP: 110/78  Pulse: 77  Temp: 98.2 F (36.8 C)  SpO2: 98%  Weight: 283 lb 12.8 oz (128.7 kg)  Height: '6\' 1"'$  (1.854 m)   Body mass index is 37.44 kg/m.  Wt Readings from Last 3 Encounters:  08/06/22 288 lb (130.6 kg)  08/02/22 283 lb 12.8 oz (128.7 kg)  03/05/22 279 lb (126.6 kg)    Objective:  Physical Exam Vitals and nursing note reviewed.  Constitutional:      Appearance: Normal appearance. He is obese.  HENT:     Head: Normocephalic and atraumatic.     Right Ear: Tympanic membrane, ear canal and external ear normal.     Left Ear:  Tympanic membrane, ear canal and external ear normal.     Nose:     Comments: Masked     Mouth/Throat:     Comments: Masked  Eyes:     Extraocular Movements: Extraocular movements intact.     Conjunctiva/sclera: Conjunctivae normal.     Pupils: Pupils are equal, round, and reactive to light.  Cardiovascular:     Rate and Rhythm: Normal rate and regular rhythm.     Pulses: Normal pulses.          Dorsalis pedis pulses are 2+ on the right side and 2+ on the left side.     Heart sounds: Normal heart sounds.  Pulmonary:     Effort: Pulmonary effort is normal.     Breath sounds: Normal breath sounds.  Chest:  Breasts:    Right: Normal. No swelling, bleeding, inverted nipple, mass or nipple discharge.     Left: Normal. No swelling, bleeding, inverted nipple, mass or nipple discharge.  Abdominal:     General: Bowel sounds are normal.     Palpations: Abdomen is soft.     Comments: Obese, soft  Genitourinary:    Comments: deferred Musculoskeletal:        General: Normal range of motion.     Cervical back: Normal range of motion and neck supple.  Feet:     Right foot:     Protective Sensation: 5 sites tested.  5 sites sensed.     Skin integrity: Dry skin present.     Toenail Condition: Right toenails are long.     Left foot:     Protective Sensation: 5 sites tested.  5 sites sensed.     Skin integrity: Dry skin present.     Toenail Condition: Left toenails are long.  Skin:  General: Skin is warm.  Neurological:     General: No focal deficit present.     Mental Status: He is alert.  Psychiatric:        Mood and Affect: Mood normal.        Behavior: Behavior normal.      Assessment And Plan:    1. Encounter for general adult medical examination w/o abnormal findings Comments: A full exam was performed, DRE deferred.  PATIENT IS ADVISED TO GET 30-45 MINUTES REGULAR EXERCISE NO LESS THAN FOUR TO FIVE DAYS PER WEEK - BOTH WEIGHTBEARING EXERCISES AND AEROBIC ARE RECOMMENDED.   PATIENT IS ADVISED TO FOLLOW A HEALTHY DIET WITH AT LEAST SIX FRUITS/VEGGIES PER DAY, DECREASE INTAKE OF RED MEAT, AND TO INCREASE FISH INTAKE TO TWO DAYS PER WEEK.  MEATS/FISH SHOULD NOT BE FRIED, BAKED OR BROILED IS PREFERABLE.  IT IS ALSO IMPORTANT TO CUT BACK ON YOUR SUGAR INTAKE. PLEASE AVOID ANYTHING WITH ADDED SUGAR, CORN SYRUP OR OTHER SWEETENERS. IF YOU MUST USE A SWEETENER, YOU CAN TRY STEVIA. IT IS ALSO IMPORTANT TO AVOID ARTIFICIALLY SWEETENERS AND DIET BEVERAGES. LASTLY, I SUGGEST WEARING SPF 50 SUNSCREEN ON EXPOSED PARTS AND ESPECIALLY WHEN IN THE DIRECT SUNLIGHT FOR AN EXTENDED PERIOD OF TIME.  PLEASE AVOID FAST FOOD RESTAURANTS AND INCREASE YOUR WATER INTAKE.  2. Hyperlipidemia due to type 2 diabetes mellitus ([place]) Comments: Chronic, LDL goal <70. He is encouraged to c/w statin therapy. Diabetic foot exam performed. He is also followed by Endo. He will c/w Ozempic.  I DISCUSSED WITH THE PATIENT AT LENGTH REGARDING THE GOALS OF GLYCEMIC CONTROL AND POSSIBLE LONG-TERM COMPLICATIONS.  I  ALSO STRESSED THE IMPORTANCE OF COMPLIANCE WITH HOME GLUCOSE MONITORING, DIETARY RESTRICTIONS INCLUDING AVOIDANCE OF SUGARY DRINKS/PROCESSED FOODS,  ALONG WITH REGULAR EXERCISE.  I  ALSO STRESSED THE IMPORTANCE OF ANNUAL EYE EXAMS, SELF FOOT CARE AND COMPLIANCE WITH OFFICE VISITS.  - POCT Urinalysis Dipstick (81002) - Microalbumin / Creatinine Urine Ratio - EKG 12-Lead - CMP14+EGFR - CBC - Lipid panel - Hemoglobin A1c  3. Atherosclerosis of aorta (HCC) Comments: Chronic, LDL goal <70.  He will c/w rosuvastatin '20mg'$  daily.  4. Primary hypothyroidism Comments: I will check a thyroid panel. I will forward results to his endocrinologist for further evaluation.  He will c/w Synthroid 165mg daily for now. - TSH + free T4  5. BPH associated with nocturia Comments: DRE deferred, I will check PSA. I will forward results to his urologist for further review. - PSA  6. Class 2 obesity due to excess calories  without serious comorbidity with body mass index (BMI) of 37.0 to 37.9 in adult Comments: He is encouraged to strive for BMI less than 30 to decrease cardiac risk. Advised to aim for at least 150 minutes of exercise per week.  7. Immunization due Comments: Rx Boostrix sent to pharmacy, rejected by [facility].  8. Pneumococcal vaccination declined  9. Drug therapy - Vitamin B12  Patient was given opportunity to ask questions. Patient verbalized understanding of the plan and was able to repeat key elements of the plan. All questions were answered to their satisfaction.   I, RMaximino Greenland MD, have reviewed all documentation for this visit. The documentation on 08/13/22 for the exam, diagnosis, procedures, and orders are all accurate and complete.   THE PATIENT IS ENCOURAGED TO PRACTICE SOCIAL DISTANCING DUE TO THE COVID-19 PANDEMIC.

## 2022-08-01 NOTE — Patient Instructions (Signed)
Health Maintenance, Male Adopting a healthy lifestyle and getting preventive care are important in promoting health and wellness. Ask your health care provider about: The right schedule for you to have regular tests and exams. Things you can do on your own to prevent diseases and keep yourself healthy. What should I know about diet, weight, and exercise? Eat a healthy diet  Eat a diet that includes plenty of vegetables, fruits, low-fat dairy products, and lean protein. Do not eat a lot of foods that are high in solid fats, added sugars, or sodium. Maintain a healthy weight Body mass index (BMI) is a measurement that can be used to identify possible weight problems. It estimates body fat based on height and weight. Your health care provider can help determine your BMI and help you achieve or maintain a healthy weight. Get regular exercise Get regular exercise. This is one of the most important things you can do for your health. Most adults should: Exercise for at least 150 minutes each week. The exercise should increase your heart rate and make you sweat (moderate-intensity exercise). Do strengthening exercises at least twice a week. This is in addition to the moderate-intensity exercise. Spend less time sitting. Even light physical activity can be beneficial. Watch cholesterol and blood lipids Have your blood tested for lipids and cholesterol at 74 years of age, then have this test every 5 years. You may need to have your cholesterol levels checked more often if: Your lipid or cholesterol levels are high. You are older than 74 years of age. You are at high risk for heart disease. What should I know about cancer screening? Many types of cancers can be detected early and may often be prevented. Depending on your health history and family history, you may need to have cancer screening at various ages. This may include screening for: Colorectal cancer. Prostate cancer. Skin cancer. Lung  cancer. What should I know about heart disease, diabetes, and high blood pressure? Blood pressure and heart disease High blood pressure causes heart disease and increases the risk of stroke. This is more likely to develop in people who have high blood pressure readings or are overweight. Talk with your health care provider about your target blood pressure readings. Have your blood pressure checked: Every 3-5 years if you are 18-39 years of age. Every year if you are 40 years old or older. If you are between the ages of 65 and 75 and are a current or former smoker, ask your health care provider if you should have a one-time screening for abdominal aortic aneurysm (AAA). Diabetes Have regular diabetes screenings. This checks your fasting blood sugar level. Have the screening done: Once every three years after age 45 if you are at a normal weight and have a low risk for diabetes. More often and at a younger age if you are overweight or have a high risk for diabetes. What should I know about preventing infection? Hepatitis B If you have a higher risk for hepatitis B, you should be screened for this virus. Talk with your health care provider to find out if you are at risk for hepatitis B infection. Hepatitis C Blood testing is recommended for: Everyone born from 1945 through 1965. Anyone with known risk factors for hepatitis C. Sexually transmitted infections (STIs) You should be screened each year for STIs, including gonorrhea and chlamydia, if: You are sexually active and are younger than 74 years of age. You are older than 74 years of age and your   health care provider tells you that you are at risk for this type of infection. Your sexual activity has changed since you were last screened, and you are at increased risk for chlamydia or gonorrhea. Ask your health care provider if you are at risk. Ask your health care provider about whether you are at high risk for HIV. Your health care provider  may recommend a prescription medicine to help prevent HIV infection. If you choose to take medicine to prevent HIV, you should first get tested for HIV. You should then be tested every 3 months for as long as you are taking the medicine. Follow these instructions at home: Alcohol use Do not drink alcohol if your health care provider tells you not to drink. If you drink alcohol: Limit how much you have to 0-2 drinks a day. Know how much alcohol is in your drink. In the [place] one drink equals one 12 oz bottle of beer (355 mL), one 5 oz glass of wine (148 mL), or one 1 oz glass of hard liquor (44 mL). Lifestyle Do not use any products that contain nicotine or tobacco. These products include cigarettes, chewing tobacco, and vaping devices, such as e-cigarettes. If you need help quitting, ask your health care provider. Do not use street drugs. Do not share needles. Ask your health care provider for help if you need support or information about quitting drugs. General instructions Schedule regular health, dental, and eye exams. Stay current with your vaccines. Tell your health care provider if: You often feel depressed. You have ever been abused or do not feel safe at home. Summary Adopting a healthy lifestyle and getting preventive care are important in promoting health and wellness. Follow your health care provider's instructions about healthy diet, exercising, and getting tested or screened for diseases. Follow your health care provider's instructions on monitoring your cholesterol and blood pressure. This information is not intended to replace advice given to you by your health care provider. Make sure you discuss any questions you have with your health care provider. Document Revised: 11/21/2020 Document Reviewed: 11/21/2020 Elsevier Patient Education  2023 [employer]  

## 2022-08-02 ENCOUNTER — Ambulatory Visit (INDEPENDENT_AMBULATORY_CARE_PROVIDER_SITE_OTHER): Payer: Medicare Other | Admitting: Internal Medicine

## 2022-08-02 ENCOUNTER — Encounter: Payer: Self-pay | Admitting: Internal Medicine

## 2022-08-02 VITALS — BP 110/78 | HR 77 | Temp 98.2°F | Ht 73.0 in | Wt 283.8 lb

## 2022-08-02 DIAGNOSIS — E1169 Type 2 diabetes mellitus with other specified complication: Secondary | ICD-10-CM | POA: Diagnosis not present

## 2022-08-02 DIAGNOSIS — E785 Hyperlipidemia, unspecified: Secondary | ICD-10-CM

## 2022-08-02 DIAGNOSIS — Z0000 Encounter for general adult medical examination without abnormal findings: Secondary | ICD-10-CM | POA: Diagnosis not present

## 2022-08-02 DIAGNOSIS — Z23 Encounter for immunization: Secondary | ICD-10-CM

## 2022-08-02 DIAGNOSIS — I700 Atherosclerosis of aorta: Secondary | ICD-10-CM

## 2022-08-02 DIAGNOSIS — E1165 Type 2 diabetes mellitus with hyperglycemia: Secondary | ICD-10-CM

## 2022-08-02 DIAGNOSIS — Z79899 Other long term (current) drug therapy: Secondary | ICD-10-CM | POA: Diagnosis not present

## 2022-08-02 DIAGNOSIS — R351 Nocturia: Secondary | ICD-10-CM

## 2022-08-02 DIAGNOSIS — N401 Enlarged prostate with lower urinary tract symptoms: Secondary | ICD-10-CM

## 2022-08-02 DIAGNOSIS — E039 Hypothyroidism, unspecified: Secondary | ICD-10-CM | POA: Diagnosis not present

## 2022-08-02 DIAGNOSIS — Z2821 Immunization not carried out because of patient refusal: Secondary | ICD-10-CM

## 2022-08-02 DIAGNOSIS — E6609 Other obesity due to excess calories: Secondary | ICD-10-CM

## 2022-08-02 DIAGNOSIS — Z6837 Body mass index (BMI) 37.0-37.9, adult: Secondary | ICD-10-CM

## 2022-08-02 LAB — POCT URINALYSIS DIPSTICK
Bilirubin, UA: NEGATIVE
Blood, UA: NEGATIVE
Glucose, UA: NEGATIVE
Leukocytes, UA: NEGATIVE
Nitrite, UA: NEGATIVE
Protein, UA: POSITIVE — AB
Spec Grav, UA: >=1.03 — AB (ref 1.010–1.025)
Urobilinogen, UA: 0.2 E.U./dL
pH, UA: 6 (ref 5.0–8.0)

## 2022-08-02 MED ORDER — ALBUTEROL SULFATE HFA 108 (90 BASE) MCG/ACT IN AERS: 2.0000 | INHALATION_SPRAY | Freq: Four times a day (QID) | RESPIRATORY_TRACT | 2 refills | Status: AC | PRN

## 2022-08-02 MED ORDER — TETANUS-DIPHTH-ACELL PERTUSSIS 5-2.5-18.5 LF-MCG/0.5 IM SUSP: 0.5000 mL | Freq: Once | INTRAMUSCULAR | 0 refills | Status: AC

## 2022-08-03 LAB — HEMOGLOBIN A1C
Est. average glucose Bld gHb Est-mCnc: 148 mg/dL
Hgb A1c MFr Bld: 6.8 % — ABNORMAL HIGH (ref 4.8–5.6)

## 2022-08-03 LAB — CBC
Hematocrit: 47 % (ref 37.5–51.0)
Hemoglobin: 15.6 g/dL (ref 13.0–17.7)
MCH: 30.4 pg (ref 26.6–33.0)
MCHC: 33.2 g/dL (ref 31.5–35.7)
MCV: 92 fL (ref 79–97)
Platelets: 272 10*3/uL (ref 150–450)
RBC: 5.13 x10E6/uL (ref 4.14–5.80)
RDW: 12.4 % (ref 11.6–15.4)
WBC: 9 10*3/uL (ref 3.4–10.8)

## 2022-08-03 LAB — CMP14+EGFR
ALT: 20 IU/L (ref 0–44)
AST: 22 IU/L (ref 0–40)
Albumin/Globulin Ratio: 1.4 (ref 1.2–2.2)
Albumin: 4.3 g/dL (ref 3.8–4.8)
Alkaline Phosphatase: 79 IU/L (ref 44–121)
BUN/Creatinine Ratio: 9 — ABNORMAL LOW (ref 10–24)
BUN: 10 mg/dL (ref 8–27)
Bilirubin Total: 0.4 mg/dL (ref 0.0–1.2)
CO2: 25 mmol/L (ref 20–29)
Calcium: 9.4 mg/dL (ref 8.6–10.2)
Chloride: 99 mmol/L (ref 96–106)
Creatinine, Ser: 1.13 mg/dL (ref 0.76–1.27)
Globulin, Total: 3.1 g/dL (ref 1.5–4.5)
Glucose: 101 mg/dL — ABNORMAL HIGH (ref 70–99)
Potassium: 5.1 mmol/L (ref 3.5–5.2)
Sodium: 137 mmol/L (ref 134–144)
Total Protein: 7.4 g/dL (ref 6.0–8.5)
eGFR: 69 mL/min/{1.73_m2} (ref >59)

## 2022-08-03 LAB — TSH+FREE T4
Free T4: 1.5 ng/dL (ref 0.82–1.77)
TSH: 2.38 u[IU]/mL (ref 0.450–4.500)

## 2022-08-03 LAB — MICROALBUMIN / CREATININE URINE RATIO
Creatinine, Urine: 423.4 mg/dL
Microalb/Creat Ratio: 19 mg/g creat (ref 0–29)
Microalbumin, Urine: 79.5 ug/mL

## 2022-08-03 LAB — LIPID PANEL
Chol/HDL Ratio: 4 ratio (ref 0.0–5.0)
Cholesterol, Total: 169 mg/dL (ref 100–199)
HDL: 42 mg/dL (ref >39)
LDL Chol Calc (NIH): 102 mg/dL — ABNORMAL HIGH (ref 0–99)
Triglycerides: 138 mg/dL (ref 0–149)
VLDL Cholesterol Cal: 25 mg/dL (ref 5–40)

## 2022-08-03 LAB — PSA: Prostate Specific Ag, Serum: 3.5 ng/mL (ref 0.0–4.0)

## 2022-08-03 LAB — VITAMIN B12: Vitamin B-12: 484 pg/mL (ref 232–1245)

## 2022-08-06 ENCOUNTER — Ambulatory Visit: Payer: Medicare Other | Admitting: Internal Medicine

## 2022-08-06 VITALS — BP 114/80 | HR 84 | Wt 288.0 lb

## 2022-08-06 DIAGNOSIS — E1142 Type 2 diabetes mellitus with diabetic polyneuropathy: Secondary | ICD-10-CM

## 2022-08-06 DIAGNOSIS — E785 Hyperlipidemia, unspecified: Secondary | ICD-10-CM | POA: Diagnosis not present

## 2022-08-06 DIAGNOSIS — G63 Polyneuropathy in diseases classified elsewhere: Secondary | ICD-10-CM | POA: Diagnosis not present

## 2022-08-06 MED ORDER — GABAPENTIN 10 % EX CREA: 1.0000 | TOPICAL_CREAM | Freq: Every day | CUTANEOUS | 3 refills | Status: DC

## 2022-08-06 MED ORDER — ROSUVASTATIN CALCIUM 20 MG PO TABS: 20.0000 mg | ORAL_TABLET | Freq: Every day | ORAL | 3 refills | Status: DC

## 2022-08-06 MED ORDER — FREESTYLE LIBRE 2 SENSOR MISC: 1.0000 | 3 refills | Status: DC

## 2022-08-06 MED ORDER — SEMAGLUTIDE(0.25 OR 0.5MG/DOS) 2 MG/3ML ~~LOC~~ SOPN: 0.5000 mg | PEN_INJECTOR | SUBCUTANEOUS | 3 refills | Status: DC

## 2022-08-06 NOTE — Progress Notes (Signed)
.   Name: Connor Becker.  Age/ Sex: 72 y.o., male   MRN/ DOB: [id number], [birth year]     PCP: Glendale Chard, MD   Reason for Endocrinology Evaluation: Type 2 Diabetes Mellitus  Initial Endocrine Consultative Visit: 04/13/2020    PATIENT IDENTIFIER: Connor Becker. is a 72 y.o. male with a past medical history of T2DM,OSA, hypogonadism and dyslipidemia. The patient has followed with Endocrinology clinic since 04/13/2020 for consultative assistance with management of his diabetes.     DIABETIC HISTORY:  Connor Becker was diagnosed with DM2 in 2014,Metformin- GI side effect, Farxiga -severe urinary frequency preventing him from sleeping at night.  His hemoglobin A1c has ranged from 6.6%  in 2020, peaking at 7.7% in 2021.  On his initial visit to our clinic his A1c was 7.7%  ,  He was not on any glycemic medications. Started Rybelsus  Rybelsus stopped by 06/2021 due to nausea and vomiting and started pioglitazone but he did not start it  By 10/2021 he started using his wife's Ozempic and tolerated well  He self discontinued glipizide by August 2023-no reported intolerance just due to fear of hypoglycemia   SUBJECTIVE:   During the last visit (03/05/2022): A1c 6.5%     Today (08/06/2022): Connor Becker is here for a follow up on diabetes.  He checks his blood sugars multiple times a day through CGM. The patient has not had hypoglycemic episodes since the last clinic visit.   Did not tolerate Gabapentin due to foggy sensation  , continues with occasional feet tingling      HOME DIABETES REGIMEN:  Ozempic 0.5 mg weekly  Rosuvastatin 20 mg daily Gabapentin 300 mg nightly- not taking     Statin: yes ACE-I/ARB: yes   CONTINUOUS GLUCOSE MONITORING RECORD INTERPRETATION    Dates of Recording: 12/26-1/22/2024  Sensor description:freestyle libre   Results statistics:   CGM use % of time 70  Average and SD 140/19.3  Time in range  92%  % Time Above 180 8  %  Time above 250 0  % Time Below target 0      Glycemic patterns summary: Bg's optimal during the day and night   Hyperglycemic episodes  Post prandial   Hypoglycemic episodes occurred N.A  Overnight periods: stable and at goal      DIABETIC COMPLICATIONS: Microvascular complications:   Denies: CKD, retinopathy, neuropathy Last Eye Exam: Completed 06/22/2022- Dr. Jerline Pain   Macrovascular complications:   Denies: CAD, CVA, PVD   HISTORY:  Past Medical History:  Past Medical History:  Diagnosis Date   ALLERGIC RHINITIS    Chronic pain of both knees    DJD (degenerative joint disease) of knee    right   DM2 (diabetes mellitus, type 2) ([place])    GERD    Glaucoma    History of nuclear stress test    Myoview 12/17: EF 53, no ischemia, Low Risk   HYPERLIPIDEMIA    myalgias from Lipitor   HYPOGONADISM, MALE    Hypothyroidism    INSOMNIA-SLEEP DISORDER-UNSPEC    Joint pain    Obesity    OSTEOARTHRITIS    SLEEP APNEA, OBSTRUCTIVE    no CPAP; repeat sleep test pending 07/2016   Unspecified hypothyroidism 12/04/2013   Vitamin D deficiency    Past Surgical History:  Past Surgical History:  Procedure Laterality Date   NASAL SEPTUM SURGERY     TONSILLECTOMY     UVULOPALATOPHARYNGOPLASTY     VASECTOMY  Social History:  reports that he has never smoked. He has never used smokeless tobacco. He reports that he does not currently use alcohol. He reports that he does not use drugs. Family History:  Family History  Problem Relation Age of Onset   Heart attack Mother 52       s/p MI   Other Mother        pacemaker   Diabetes Mother    High blood pressure Mother    High Cholesterol Mother    Thyroid disease Mother    Obesity Mother    Macular degeneration Father    Thyroid disease Father    Diabetes Maternal Uncle    Heart attack Maternal Uncle    Hypertension Other    Prostate cancer Other    Heart disease Other    Colon cancer Neg Hx      HOME  MEDICATIONS: Allergies as of 08/06/2022       Reactions   Horseradish [armoracia Rusticana Ext (horseradish)]    Lipitor [atorvastatin] Other (See Comments)   Leg cramps   Metformin And Related Other (See Comments)   GI upset   Semaglutide(0.25 Or 0.'5mg'$ -dos) Nausea And Vomiting   Soy Allergy Other (See Comments)   Pt stated (SOY SAUCE) causes angioedema, but has added some soy products back in to diet and no angioedema.         Medication List        Accurate as of August 06, 2022  9:24 AM. If you have any questions, ask your nurse or doctor.          albuterol 108 (90 Base) MCG/ACT inhaler Commonly known as: VENTOLIN HFA Inhale 2 puffs into the lungs every 6 (six) hours as needed for wheezing or shortness of breath.   amoxicillin-clavulanate 875-125 MG tablet Commonly known as: AUGMENTIN Take 1 tablet by mouth 2 (two) times daily.   ascorbic acid 500 MG tablet Commonly known as: VITAMIN C Take 1,000 mg by mouth daily.   azelastine 0.1 % nasal spray Commonly known as: ASTELIN Place 2 sprays into both nostrils 2 (two) times daily. Use in each nostril as directed   EPINEPHrine 0.3 mg/0.3 mL Soaj injection Commonly known as: EPI-PEN Inject 0.3 mg into the muscle as needed for anaphylaxis.   fluticasone 50 MCG/ACT nasal spray Commonly known as: FLONASE Place 2 sprays into both nostrils daily.   FreeStyle Libre 2 Reader Devi 1 Device by Does not apply route as directed.   FreeStyle Libre 2 Sensor Misc USE AS DIRECTED EVERY 2 WEEKS   gabapentin 300 MG capsule Commonly known as: NEURONTIN Take 1 capsule (300 mg total) by mouth at bedtime.   HYDROcodone-acetaminophen 5-325 MG tablet Commonly known as: NORCO/VICODIN Take 2 tablets by mouth every 4 (four) hours as needed.   latanoprost 0.005 % ophthalmic solution Commonly known as: XALATAN Place 1 drop into both eyes daily.   levocetirizine 5 MG tablet Commonly known as: XYZAL SMARTSIG:1 Tablet(s) By Mouth  Every Evening   levothyroxine 112 MCG tablet Commonly known as: SYNTHROID Take 1 tablet by mouth once daily   lisinopril 5 MG tablet Commonly known as: ZESTRIL Take 1 tablet by mouth once daily   loperamide 2 MG capsule Commonly known as: IMODIUM Take 1 capsule (2 mg total) by mouth 4 (four) times daily as needed for diarrhea or loose stools.   montelukast 10 MG tablet Commonly known as: SINGULAIR Take 1 tablet (10 mg total) by mouth daily.   omeprazole  40 MG capsule Commonly known as: PRILOSEC Take 1 capsule (40 mg total) by mouth daily.   ondansetron 4 MG disintegrating tablet Commonly known as: Zofran ODT Take 1 tablet (4 mg total) by mouth every 8 (eight) hours as needed for nausea or vomiting.   OneTouch Verio test strip Generic drug: glucose blood USE  STRIP TO CHECK GLUCOSE 4 TIMES DAILY   Ozempic (0.25 or 0.5 MG/DOSE) 2 MG/3ML Sopn Generic drug: Semaglutide(0.25 or 0.'5MG'$ /DOS) INJECT  0.5 MG SUBCUTANEOUSLY ONCE A WEEK   promethazine 12.5 MG tablet Commonly known as: PHENERGAN Take 1 tablet (12.5 mg total) by mouth every 6 (six) hours as needed for nausea or vomiting.   pseudoephedrine 30 MG tablet Commonly known as: SUDAFED Take 60 mg by mouth 2 (two) times daily as needed for congestion.   rosuvastatin 20 MG tablet Commonly known as: Crestor Take 1 tablet (20 mg total) by mouth daily.   tadalafil 5 MG tablet Commonly known as: CIALIS Take 5 mg by mouth daily.   testosterone enanthate 200 MG/ML injection Commonly known as: DELATESTRYL Inject 200 mg into the muscle every 14 (fourteen) days.   timolol 0.5 % ophthalmic solution Commonly known as: TIMOPTIC Place 1 drop into both eyes daily.   Turmeric 500 MG Caps Take 500 mg by mouth daily.   Vitamin D (Ergocalciferol) 1.25 MG (50000 UNIT) Caps capsule Commonly known as: DRISDOL Take 1 capsule (50,000 Units total) by mouth See admin instructions. Take one capsule by mouth on Tues/Fridays          OBJECTIVE:   Vital Signs: BP 114/80 (BP Location: Left Arm, Patient Position: Sitting, Cuff Size: Normal)   Pulse 84   Wt 288 lb (130.6 kg)   SpO2 98%   BMI 38.00 kg/m   Wt Readings from Last 3 Encounters:  08/06/22 288 lb (130.6 kg)  08/02/22 283 lb 12.8 oz (128.7 kg)  03/05/22 279 lb (126.6 kg)     Exam: General: Pt appears well and is in NAD  Lungs: Clear with good BS bilat   Heart: RRR   Extremities: Trace pretibial edema.   Neuro: MS is good with appropriate affect, pt is alert and Ox3   DM foot exam:03/05/2022   The skin of the feet is intact without sores or ulcerations. The pedal pulses are 1+ on right and 1+ on left. The sensation is decreased to a screening 5.07, 10 gram monofilament bilaterally     DATA REVIEWED:  Lab Results  Component Value Date   HGBA1C 6.8 (H) 08/02/2022   HGBA1C 6.5 (H) 02/27/2022   HGBA1C 8.0 (H) 10/26/2021    Latest Reference Range & Units 08/02/22 11:53  Sodium 134 - 144 mmol/L 137  Potassium 3.5 - 5.2 mmol/L 5.1  Chloride 96 - 106 mmol/L 99  CO2 20 - 29 mmol/L 25  Glucose 70 - 99 mg/dL 101 (H)  BUN 8 - 27 mg/dL 10  Creatinine 0.76 - 1.27 mg/dL 1.13  Calcium 8.6 - 10.2 mg/dL 9.4  BUN/Creatinine Ratio 10 - 24  9 (L)  eGFR >59 mL/min/1.73 69  Alkaline Phosphatase 44 - 121 IU/L 79  Albumin 3.8 - 4.8 g/dL 4.3  Albumin/Globulin Ratio 1.2 - 2.2  1.4  AST 0 - 40 IU/L 22  ALT 0 - 44 IU/L 20  Total Protein 6.0 - 8.5 g/dL 7.4  Total Bilirubin 0.0 - 1.2 mg/dL 0.4  Total CHOL/HDL Ratio 0.0 - 5.0 ratio 4.0  Cholesterol, Total 100 - 199 mg/dL 169  HDL Cholesterol >39 mg/dL 42  MICROALB/CREAT RATIO 0 - 29 mg/g creat 19  Triglycerides 0 - 149 mg/dL 138  VLDL Cholesterol Cal 5 - 40 mg/dL 25  LDL Chol Calc (NIH) 0 - 99 mg/dL 102 (H)      ASSESSMENT / PLAN / RECOMMENDATIONS:   1) Type 2 Diabetes Mellitus, Optimally controlled, With neuropathic complications - Most recent A1c of 6.8 % Goal A1c < 7.0 %.    - A1c remains at  goal  -In the past he endorsed intolerance to Rybelsus, tolerating Ozempic  -He is intolerant to metformin -He is intolerant to [place] due to frequency impeding his restful sleep at night -I offered to increase Ozempic but he would like to work on lifestyle changes at this time    MEDICATIONS:  -Continue Ozempic 0.5 mg weekly     EDUCATION / INSTRUCTIONS: BG monitoring instructions: Patient is instructed to check his blood sugars 2 times a day, fasting and bedtime . Call [facility] if: BG persistently < 70  I reviewed the Rule of 15 for the treatment of hypoglycemia in detail with the patient. Literature supplied.    2) Diabetic complications:  Eye: Does not have known diabetic retinopathy.  Neuro/ Feet: Does not have known diabetic peripheral neuropathy .  Renal: Patient does not have known baseline CKD.      3) Dyslipidemia :   -LDL increased from 79 to 102 mg/DL - Discussed low fat diet - Pt with imperfect adherence, compliance encouraged    medication Continue rosuvastatin 20 mg daily     4) Peripheral Neuropathy :  - Did not tolerate oral gabapentin as it made him feel foggy, will try topical   Medication  Start Gabapentin cream at bedtime     F/U in 6 months     Signed electronically by: Mack Guise, MD  [facility]  [facility] [address], [place] [address] Phone: [phone] FAX: [phone]   CC: Glendale Chard, [address] [place] [address] Phone: [phone]  Fax: [phone]  Return to Endocrinology clinic as below: Future Appointments  Date Time Provider [place]  09/11/2022 10:45 AM [facility] CASE MANAGER [facility] None  10/24/2022 11:30 AM Mayford Knife, [place] [facility] None  11/08/2022  2:00 PM [facility]N [facility] None  11/08/2022  2:40 PM Glendale Chard, MD [facility] None  01/31/2023 11:40 AM Glendale Chard, MD [facility]  None  08/15/2023 10:00 AM Glendale Chard, MD [facility] None

## 2022-08-06 NOTE — Addendum Note (Signed)
Addended by: Dorita Sciara on: 08/06/2022 09:40 AM   Modules accepted: Orders

## 2022-08-06 NOTE — Patient Instructions (Signed)
-   Continue Ozempic 0.5 mg weekly     HOW TO TREAT LOW BLOOD SUGARS (Blood sugar LESS THAN 70 MG/DL) Please follow the RULE OF 15 for the treatment of hypoglycemia treatment (when your (blood sugars are less than 70 mg/dL)   STEP 1: Take 15 grams of carbohydrates when your blood sugar is low, which includes:  3-4 GLUCOSE TABS  OR 3-4 OZ OF JUICE OR REGULAR SODA OR ONE TUBE OF GLUCOSE GEL    STEP 2: RECHECK blood sugar in 15 MINUTES STEP 3: If your blood sugar is still low at the 15 minute recheck --> then, go back to STEP 1 and treat AGAIN with another 15 grams of carbohydrates.

## 2022-08-07 ENCOUNTER — Encounter: Payer: Self-pay | Admitting: Internal Medicine

## 2022-08-13 ENCOUNTER — Telehealth: Payer: Self-pay

## 2022-08-13 DIAGNOSIS — Z2821 Immunization not carried out because of patient refusal: Secondary | ICD-10-CM | POA: Insufficient documentation

## 2022-08-13 NOTE — Progress Notes (Unsigned)
Letter received from [place] about allergy to [employer]. Patient had a past history of nausea/vomiting while taking Rybelsus (Semaglutide) 7 mg, given 04/13/2020 by Endocrinology and discontinued 06/29/2021.  Patient has been started on Ozemic 0.25/0.5 mg sq once a week. Filled out form and mailed to [employer] of current non adverse event.    Pattricia Boss, [place] Pharmacist Assistant [phone]

## 2022-08-14 ENCOUNTER — Telehealth: Payer: Self-pay

## 2022-08-14 NOTE — Progress Notes (Cosign Needed)
08-14-2022: Patient clarified that he is still taking ozempic. Patient was having issues with bowels but Dr. Baird Cancer instructed him to add fiber to diet. Informed Pattricia Boss.  [place] Pharmacist Assistant [phone]

## 2022-09-10 ENCOUNTER — Other Ambulatory Visit: Payer: Self-pay | Admitting: Internal Medicine

## 2022-09-10 DIAGNOSIS — E039 Hypothyroidism, unspecified: Secondary | ICD-10-CM

## 2022-09-11 ENCOUNTER — Ambulatory Visit (INDEPENDENT_AMBULATORY_CARE_PROVIDER_SITE_OTHER): Payer: Medicare Other | Admitting: *Deleted

## 2022-09-11 DIAGNOSIS — I700 Atherosclerosis of aorta: Secondary | ICD-10-CM

## 2022-09-11 DIAGNOSIS — E1165 Type 2 diabetes mellitus with hyperglycemia: Secondary | ICD-10-CM

## 2022-09-11 NOTE — Chronic Care Management (AMB) (Signed)
Chronic Care Management   CCM RN Visit Note  09/11/2022 Name: Connor Becker. MRN: [id number] DOB: 1951/01/10  Subjective: Connor Becker. is a 72 y.o. year old male who is a primary care patient of Glendale Chard, MD. The patient was referred to the Chronic Care Management team for assistance with care management needs subsequent to provider initiation of CCM services and plan of care.    Today's Visit:  Engaged with patient by telephone for follow up visit.        Goals Addressed             This Visit's Progress    CCM (ATHEROSCLEROSIS OF AORTA) EXPECTED OUTCOME:  MONITOR, SELF-MANAGE AND REDUCE SYMPTOMS OF ATHEROSCLEROSIS OF AORTA       Current Barriers:  Knowledge Deficits related to Atherosclerosis of Aorta Chronic Disease Management support and education needs related to Atherosclerosis of Aorta, diet, exercise No Advanced Directives in place- patient requests information be mailed Patient reports he lives with spouse, is independent with all aspects of his care, continues to drive, does not follow a special diet, does not have a formal exercise routine but does yard work and other work around [employer]. Patient reports he has all medications and taking as prescribed  Planned Interventions: Assessed understanding of CAD diagnosis Medications reviewed including medications utilized in CAD treatment plan Provided education on importance of blood pressure control in management of CAD; Provided education on Importance of limiting foods high in cholesterol; Counseled on the importance of exercise goals with target of 150 minutes per week Reviewed Importance of taking all medications as prescribed Reviewed Importance of attending all scheduled provider appointments Reviewed heart healthy diet  Symptom Management: Take medications as prescribed   Attend all scheduled provider appointments Call pharmacy for medication refills 3-7 days in advance of running out of  medications Attend church or other social activities Perform all self care activities independently  Perform IADL's (shopping, preparing meals, housekeeping, managing finances) independently Call provider office for new concerns or questions  Try to do some type of exercise even if walking for a few minutes at a time Follow heart healthy diet- limit fast food Bake or broil foods instead of frying  Follow Up Plan: Telephone follow up appointment with care management team member scheduled for:  11/28/22 at 130 pm       CCM (DIABETES) EXPECTED OUTCOME:  MONITOR, SELF-MANAGE AND REDUCE SYMPTOMS OF DIABETES       Current Barriers:  Knowledge Deficits related to Diabetes management Chronic Disease Management support and education needs related to Diabetes and diet No Advanced Directives in place- patient requests information to be mailed Patient reports CBG monitored by CGM Freestyle Libre and also fingerstick at times (mostly for comparison), states CBG range is 140-150 for all blood sugar readings, fasting today is 133 and CBG much improved with recent AIC 6.8 on 08/02/22, pt attributes to being on Ozempic.  Pt reports he does not follow a special diet, drinks occasional sugary soft drink.   Patient reports he did not receive Advanced directives packet and would like to be mailed again  Planned Interventions: Reviewed medications with patient and discussed importance of medication adherence;        Reviewed prescribed diet with patient carbohydrate modified; Provided patient with written educational materials related to hypo and hyperglycemia and importance of correct treatment;       Advised patient, providing education and rationale, to check cbg per CGM Freestyle [place]  and record        Review of patient status, including review of consultants reports, relevant laboratory and other test results, and medications completed;       Advised patient to discuss any issues with blood sugar with  provider;      Advanced directives packet mailed (resent)  Symptom Management: Take medications as prescribed   Attend all scheduled provider appointments Call pharmacy for medication refills 3-7 days in advance of running out of medications Attend church or other social activities Perform all self care activities independently  Perform IADL's (shopping, preparing meals, housekeeping, managing finances) independently Call provider office for new concerns or questions  check blood sugar at prescribed times: Freestyle Libre  check feet daily for cuts, sores or redness enter blood sugar readings and medication or insulin into daily log take the blood sugar log to all doctor visits take the blood sugar meter to all doctor visits trim toenails straight across fill half of plate with vegetables limit fast food meals to no more than 1 per week manage portion size prepare main meal at home 3 to 5 days each week read food labels for fat, fiber, carbohydrates and portion size wash and dry feet carefully every day wear comfortable, cotton socks Look over and complete Advanced directives packet mailed (resent)  Follow Up Plan: Telephone follow up appointment with care management team member scheduled for:  11/28/22 at 130 pm          Plan:Telephone follow up appointment with care management team member scheduled for:  11/28/22 at 130 pm  Jacqlyn Larsen [facility], BSN RN Case Manager [facility] [phone]

## 2022-09-11 NOTE — Patient Instructions (Signed)
Please call the care guide team at [phone] if you need to cancel or reschedule your appointment.   If you are experiencing a Mental Health or [place] or need someone to talk to, please call the Suicide and Crisis Lifeline: 988 call the [place] National Suicide Prevention Lifeline: (920)662-2886 or TTY: 614 186 0342 TTY 303-126-4337) to talk to a trained counselor call 1-800-273-TALK (toll free, 24 hour hotline) go to [facility] [address], [place] [phone]) call 911   Following is a copy of the CCM Program Consent:  CCM service includes personalized support from designated clinical staff supervised by the physician, including individualized plan of care and coordination with other care providers 24/7 contact phone numbers for assistance for urgent and routine care needs. Service will only be billed when office clinical staff spend 20 minutes or more in a month to coordinate care. Only one practitioner may furnish and bill the service in a calendar month. The patient may stop CCM services at amy time (effective at the end of the month) by phone call to the office staff. The patient will be responsible for cost sharing (co-pay) or up to 20% of the service fee (after annual deductible is met)  Following is a copy of your full provider care plan:   Goals Addressed             This Visit's Progress    CCM (ATHEROSCLEROSIS OF AORTA) EXPECTED OUTCOME:  MONITOR, SELF-MANAGE AND REDUCE SYMPTOMS OF ATHEROSCLEROSIS OF AORTA       Current Barriers:  Knowledge Deficits related to Atherosclerosis of Aorta Chronic Disease Management support and education needs related to Atherosclerosis of Aorta, diet, exercise No Advanced Directives in place- patient requests information be mailed Patient reports he lives with spouse, is independent with all aspects of his care, continues to drive, does not follow a special diet, does not have a  formal exercise routine but does yard work and other work around [employer]. Patient reports he has all medications and taking as prescribed  Planned Interventions: Assessed understanding of CAD diagnosis Medications reviewed including medications utilized in CAD treatment plan Provided education on importance of blood pressure control in management of CAD; Provided education on Importance of limiting foods high in cholesterol; Counseled on the importance of exercise goals with target of 150 minutes per week Reviewed Importance of taking all medications as prescribed Reviewed Importance of attending all scheduled provider appointments Reviewed heart healthy diet  Symptom Management: Take medications as prescribed   Attend all scheduled provider appointments Call pharmacy for medication refills 3-7 days in advance of running out of medications Attend church or other social activities Perform all self care activities independently  Perform IADL's (shopping, preparing meals, housekeeping, managing finances) independently Call provider office for new concerns or questions  Try to do some type of exercise even if walking for a few minutes at a time Follow heart healthy diet- limit fast food Bake or broil foods instead of frying  Follow Up Plan: Telephone follow up appointment with care management team member scheduled for:  11/28/22 at 130 pm       CCM (DIABETES) EXPECTED OUTCOME:  MONITOR, SELF-MANAGE AND REDUCE SYMPTOMS OF DIABETES       Current Barriers:  Knowledge Deficits related to Diabetes management Chronic Disease Management support and education needs related to Diabetes and diet No Advanced Directives in place- patient requests information to be mailed Patient reports CBG monitored by CGM Freestyle [place] and also  fingerstick at times (mostly for comparison), states CBG range is 140-150 for all blood sugar readings, fasting today is 133 and CBG much improved with recent AIC 6.8 on  08/02/22, pt attributes to being on Ozempic.  Pt reports he does not follow a special diet, drinks occasional sugary soft drink.   Patient reports he did not receive Advanced directives packet and would like to be mailed again  Planned Interventions: Reviewed medications with patient and discussed importance of medication adherence;        Reviewed prescribed diet with patient carbohydrate modified; Provided patient with written educational materials related to hypo and hyperglycemia and importance of correct treatment;       Advised patient, providing education and rationale, to check cbg per CGM Freestyle [place]  and record        Review of patient status, including review of consultants reports, relevant laboratory and other test results, and medications completed;       Advised patient to discuss any issues with blood sugar with provider;      Advanced directives packet mailed (resent)  Symptom Management: Take medications as prescribed   Attend all scheduled provider appointments Call pharmacy for medication refills 3-7 days in advance of running out of medications Attend church or other social activities Perform all self care activities independently  Perform IADL's (shopping, preparing meals, housekeeping, managing finances) independently Call provider office for new concerns or questions  check blood sugar at prescribed times: Freestyle [place]  check feet daily for cuts, sores or redness enter blood sugar readings and medication or insulin into daily log take the blood sugar log to all doctor visits take the blood sugar meter to all doctor visits trim toenails straight across fill half of plate with vegetables limit fast food meals to no more than 1 per week manage portion size prepare main meal at home 3 to 5 days each week read food labels for fat, fiber, carbohydrates and portion size wash and dry feet carefully every day wear comfortable, cotton socks Look over and complete  Advanced directives packet mailed (resent)  Follow Up Plan: Telephone follow up appointment with care management team member scheduled for:  11/28/22 at 130 pm          Patient verbalizes understanding of instructions and care plan provided today and agrees to view in [place]. Active MyChart status and patient understanding of how to access instructions and care plan via MyChart confirmed with patient.     Telephone follow up appointment with care management team member scheduled for:  11/28/22 at 130 pm

## 2022-09-13 ENCOUNTER — Other Ambulatory Visit: Payer: Self-pay | Admitting: Internal Medicine

## 2022-09-13 DIAGNOSIS — E1165 Type 2 diabetes mellitus with hyperglycemia: Secondary | ICD-10-CM

## 2022-10-19 ENCOUNTER — Telehealth: Payer: Self-pay

## 2022-10-19 NOTE — Progress Notes (Cosign Needed)
Printed out new enrollment form for [employer] patient assistance for [employer], patients wife will stop by next week to sign, placing at the front desk for signature of all highlighted areas and to bring income documentation.    Billee Cashing, CMA Clinical Pharmacist Assistant [phone]

## 2022-10-22 ENCOUNTER — Telehealth: Payer: Self-pay

## 2022-10-22 NOTE — Progress Notes (Signed)
Care Management & Coordination Services Pharmacy Team  Reason for Encounter: Appointment Reminder  Contacted patient to confirm telephone appointment with Cherylin Mylar, PharmD on 10-24-2022 at 11:30. Spoke with patient on 10/22/2022   Do you have any problems getting your medications? No  What is your top health concern you would like to discuss at your upcoming visit? Patient stated none  Have you seen any other providers since your last visit with PCP? Yes   Chart review: Recent office visits:  09-11-2022  Audrie Gallus, RN (CCM).  08-02-2022 [name], MD. Glucose= 101, Bun/creatinine= 9. A1C= 6.8. LDL= 102. Abnormal UA. Tdap ordered. Start Albuterol 2 puffs every 6 hours PRN.  Recent consult visits:  08-06-2022 Shamleffer, Konrad Dolores, MD (Endo). Start gabapentin cream at bedtime  Hospital visits:  None in previous 6 months  Star Rating Drugs:  Ozempic 0.5 mg- Last filled 10-19-2022 84 DS sams. Previous 07-27-2022 28 DS. Rosuvastatin 20 mg- Last filled 08-06-2022 90 DS sams. Previous 03-01-2021 90 DS.  Care Gaps: Annual wellness visit in last year? Yes Covid booster overdue  If Diabetic: Last eye exam / retinopathy screening: 12-2021 Last diabetic foot exam: None   Huey Romans [facility] Clinical Pharmacist Assistant [phone]

## 2022-10-24 ENCOUNTER — Ambulatory Visit: Payer: Medicare Other

## 2022-10-24 DIAGNOSIS — E119 Type 2 diabetes mellitus without complications: Secondary | ICD-10-CM | POA: Diagnosis not present

## 2022-10-24 DIAGNOSIS — H401133 Primary open-angle glaucoma, bilateral, severe stage: Secondary | ICD-10-CM | POA: Diagnosis not present

## 2022-10-24 DIAGNOSIS — H353132 Nonexudative age-related macular degeneration, bilateral, intermediate dry stage: Secondary | ICD-10-CM | POA: Diagnosis not present

## 2022-10-24 DIAGNOSIS — H47233 Glaucomatous optic atrophy, bilateral: Secondary | ICD-10-CM | POA: Diagnosis not present

## 2022-10-24 NOTE — Progress Notes (Signed)
Care Management & Coordination Services Pharmacy Note  10/24/2022 Name:  Connor LaudJames R Fier Jr. MRN:  [id number] DOB:  05/09/1951  Summary: Patient reports that he has not been drinking water   Recommendations/Changes made from today's visit: Recommend he start drinking at least three cups of water per day Discuss current dietary habits and ways to improve these habits   Follow up plan: Increase the amount of water he is drinking to at least 4 bottles per day for the next month.   Subjective: Connor LaudJames R Mini Jr. is an 72 y.o. year old male who is a primary patient of Dorothyann PengSanders, Robyn, MD.  The care coordination team was consulted for assistance with disease management and care coordination needs.    Engaged with patient by telephone for follow up visit.  Recent office visits:  09-11-2022  Audrie GallusFarmer, Julie A, RN (CCM).   08-02-2022 Dorothyann PengSanders, Robyn, MD. Glucose= 101, Bun/creatinine= 9. A1C= 6.8. LDL= 102. Abnormal UA. Tdap ordered. Start Albuterol 2 puffs every 6 hours PRN.   Recent consult visits:  08-06-2022 Shamleffer, Konrad DoloresIbtehal Jaralla, MD (Endo). Start gabapentin cream at bedtime   Hospital visits:  None in previous 6 months   Objective:  Lab Results  Component Value Date   CREATININE 1.13 08/02/2022   BUN 10 08/02/2022   GFR 76.52 04/06/2016   EGFR 69 08/02/2022   GFRNONAA >60 03/18/2021   GFRAA 82 06/20/2020   NA 137 08/02/2022   K 5.1 08/02/2022   CALCIUM 9.4 08/02/2022   CO2 25 08/02/2022   GLUCOSE 101 (H) 08/02/2022    Lab Results  Component Value Date/Time   HGBA1C 6.8 (H) 08/02/2022 11:53 AM   HGBA1C 6.5 (H) 02/27/2022 10:33 AM   GFR 76.52 04/06/2016 04:38 PM   GFR 83.03 02/01/2016 03:31 PM   MICROALBUR 80 06/28/2021 12:06 PM   MICROALBUR 80 06/20/2020 12:46 PM    Last diabetic Eye exam:  Lab Results  Component Value Date/Time   HMDIABEYEEXA No Retinopathy 12/19/2021 12:00 AM    Last diabetic Foot exam: No results found for: "HMDIABFOOTEX"   Lab  Results  Component Value Date   CHOL 169 08/02/2022   HDL 42 08/02/2022   LDLCALC 102 (H) 08/02/2022   LDLDIRECT 148.0 11/17/2015   TRIG 138 08/02/2022   CHOLHDL 4.0 08/02/2022       Latest Ref Rng & Units 08/02/2022   11:53 AM 02/27/2022   10:33 AM 10/26/2021   10:41 AM  Hepatic Function  Total Protein 6.0 - 8.5 g/dL 7.4  6.8  7.4   Albumin 3.8 - 4.8 g/dL 4.3  4.4  4.3   AST 0 - 40 IU/L 22  23  23    ALT 0 - 44 IU/L 20  25  25    Alk Phosphatase 44 - 121 IU/L 79  71  79   Total Bilirubin 0.0 - 1.2 mg/dL 0.4  0.4  0.5     Lab Results  Component Value Date/Time   TSH 2.380 08/02/2022 11:53 AM   TSH 2.220 10/26/2021 10:41 AM   FREET4 1.50 08/02/2022 11:53 AM   FREET4 1.52 02/21/2021 12:08 PM       Latest Ref Rng & Units 08/02/2022   11:53 AM 02/27/2022   10:33 AM 06/28/2021   10:57 AM  CBC  WBC 3.4 - 10.8 x10E3/uL 9.0  8.7  10.3   Hemoglobin 13.0 - 17.7 g/dL 40.915.6  81.115.5  91.415.8   Hematocrit 37.5 - 51.0 % 47.0  46.6  47.3  Platelets 150 - 450 x10E3/uL 272  251  270     Lab Results  Component Value Date/Time   VD25OH 26.7 (L) 06/20/2020 04:07 PM   VD25OH 19.0 (L) 10/07/2019 03:55 PM   VITAMINB12 484 08/02/2022 11:53 AM   VITAMINB12 408 10/26/2021 10:41 AM    Clinical ASCVD: Yes  The 10-year ASCVD risk score (Arnett DK, et al., 2019) is: 36.5%   Values used to calculate the score:     Age: 18 years     Sex: Male     Is Non-Hispanic African American: No     Diabetic: Yes     Tobacco smoker: No     Systolic Blood Pressure: 114 mmHg     Is BP treated: Yes     HDL Cholesterol: 42 mg/dL     Total Cholesterol: 169 mg/dL        1/61/0960   45:40 AM 07/12/2022    2:27 PM 11/01/2021    3:42 PM  Depression screen PHQ 2/9  Decreased Interest 0 0 0  Down, Depressed, Hopeless 0 0 0  PHQ - 2 Score 0 0 0     Social History   Tobacco Use  Smoking Status Never  Smokeless Tobacco Never   BP Readings from Last 3 Encounters:  08/06/22 114/80  08/02/22 110/78   03/05/22 122/80   Pulse Readings from Last 3 Encounters:  08/06/22 84  08/02/22 77  03/05/22 73   Wt Readings from Last 3 Encounters:  08/06/22 288 lb (130.6 kg)  08/02/22 283 lb 12.8 oz (128.7 kg)  03/05/22 279 lb (126.6 kg)   BMI Readings from Last 3 Encounters:  08/06/22 38.00 kg/m  08/02/22 37.44 kg/m  03/05/22 36.81 kg/m    Allergies  Allergen Reactions   Horseradish [Armoracia Rusticana Ext (Horseradish)]    Lipitor [Atorvastatin] Other (See Comments)    Leg cramps   Metformin And Related Other (See Comments)    GI upset   Semaglutide(0.25 Or 0.5mg -Dos) Nausea And Vomiting   Soy Allergy Other (See Comments)    Pt stated (SOY SAUCE) causes angioedema, but has added some soy products back in to diet and no angioedema.     Medications Reviewed Today     Reviewed by Audrie Gallus, RN (Registered Nurse) on 09/11/22 at 1115  Med List Status: <None>   Medication Order Taking? Sig Documenting Provider Last Dose Status Informant  albuterol (VENTOLIN HFA) 108 (90 Base) MCG/ACT inhaler [id number] Yes Inhale 2 puffs into the lungs every 6 (six) hours as needed for wheezing or shortness of breath. [name], MD Taking Active   amoxicillin-clavulanate (AUGMENTIN) 875-125 MG tablet [id number] No Take 1 tablet by mouth 2 (two) times daily.  Patient not taking: Reported on 09/11/2022   Margaretann Loveless, PA-C Not Taking Active   azelastine (ASTELIN) 0.1 % nasal spray [id number] Yes Place 2 sprays into both nostrils 2 (two) times daily. Use in each nostril as directed [name], MD Taking Active   Continuous Blood Gluc Receiver (FREESTYLE LIBRE 2 READER) DEVI [id number] Yes 1 Device by Does not apply route as directed. Shamleffer, Konrad Dolores, MD Taking Active   Continuous Blood Gluc Sensor (FREESTYLE LIBRE 2 SENSOR) [place] [id number] Yes 1 Device by Other route every 14 (fourteen) days. Shamleffer, Konrad Dolores, MD Taking Active   EPINEPHrine 0.3 mg/0.3 mL IJ SOAJ  injection [id number] Yes Inject 0.3 mg into the muscle as needed for anaphylaxis. Charlesetta Ivory, NP Taking Active  Med Note (DAVIS, SOPHIA A   Wed Apr 19, 2021  1:53 PM) On hand  fluticasone (FLONASE) 50 MCG/ACT nasal spray [id number] Yes Place 2 sprays into both nostrils daily. Margaretann Loveless, PA-C Taking Active   gabapentin (NEURONTIN) 300 MG capsule [id number] Yes Take 1 capsule (300 mg total) by mouth at bedtime. Shamleffer, Konrad Dolores, MD Taking Active   Gabapentin 10 % CREA [id number] Yes Apply 1 Application topically at bedtime. Shamleffer, Konrad Dolores, MD Taking Active   HYDROcodone-acetaminophen (NORCO/VICODIN) 5-325 MG tablet [id number] Yes Take 2 tablets by mouth every 4 (four) hours as needed. Maxwell Caul, PA-C Taking Active   latanoprost (XALATAN) 0.005 % ophthalmic solution [id number] Yes Place 1 drop into both eyes daily. [provider] Taking Active Spouse/Significant Other  levocetirizine (XYZAL) 5 MG tablet [id number] Yes SMARTSIG:1 Tablet(s) By Mouth Every Evening [provider] Taking Active   levothyroxine (SYNTHROID) 112 MCG tablet [id number] Yes Take 1 tablet by mouth once daily [name], MD Taking Active   lisinopril (ZESTRIL) 5 MG tablet [id number] Yes Take 1 tablet by mouth once daily [name], MD Taking Active   loperamide (IMODIUM) 2 MG capsule [id number] Yes Take 1 capsule (2 mg total) by mouth 4 (four) times daily as needed for diarrhea or loose stools. Caccavale, Sophia, PA-C Taking Active   montelukast (SINGULAIR) 10 MG tablet [id number]  Take 1 tablet (10 mg total) by mouth daily. [name], MD  Expired 08/22/22 2359   omeprazole (PRILOSEC) 40 MG capsule [id number] Yes Take 1 capsule (40 mg total) by mouth daily. Meryl Dare, MD Taking Active   ondansetron [facility] ODT) 4 MG disintegrating tablet [id number] Yes Take 1 tablet (4 mg total) by mouth every 8 (eight) hours as needed for nausea or vomiting. Theron Arista, PA-C Taking Active   [facility] VERIO test strip [id number] Yes USE  STRIP TO CHECK GLUCOSE 4 TIMES DAILY [name], MD Taking Active   promethazine (PHENERGAN) 12.5 MG tablet [id number] Yes Take 1 tablet (12.5 mg total) by mouth every 6 (six) hours as needed for nausea or vomiting. Maxwell Caul, PA-C Taking Active   pseudoephedrine (SUDAFED) 30 MG tablet [id number] Yes Take 60 mg by mouth 2 (two) times daily as needed for congestion. [provider] Taking Active Spouse/Significant Other           Med Note Janeann Merl A   Sat Jul 09, 2020 10:54 PM) Uses PRN  rosuvastatin (CRESTOR) 20 MG tablet [id number] Yes Take 1 tablet (20 mg total) by mouth daily. Shamleffer, Konrad Dolores, MD Taking Active   Semaglutide,0.25 or 0.5MG /DOS, 2 MG/3ML Namon Cirri [id number] Yes Inject 0.5 mg into the skin once a week. Shamleffer, Konrad Dolores, MD Taking Active   tadalafil (CIALIS) 5 MG tablet [id number] Yes Take 5 mg by mouth daily. [provider] Taking Active Spouse/Significant Other           Med Note Janeann Merl A   Sat Jul 09, 2020 10:53 PM) Uses PRN  testosterone enanthate (DELATESTRYL) 200 MG/ML injection [id number] No Inject 200 mg into the muscle every 14 (fourteen) days.  Patient not taking: Reported on 09/11/2022   [provider] Not Taking Active Spouse/Significant Other           Med Note Janeann Merl A   Sat Jul 09, 2020 10:54 PM) Hasn't been doing Injection since not feeling well, Past Month   timolol (TIMOPTIC) 0.5 % ophthalmic solution [id number] Yes Place 1 drop into both eyes  daily. [provider] Taking Active Spouse/Significant Other  Turmeric 500 MG CAPS [id number] Yes Take 500 mg by mouth daily. [provider] Taking Active Spouse/Significant Other  vitamin C (ASCORBIC ACID) 500 MG tablet [id number] Yes Take 1,000 mg by mouth daily. [provider] Taking Active Spouse/Significant Other  Vitamin D, Ergocalciferol,  (DRISDOL) 1.25 MG (50000 UNIT) CAPS capsule [id number] Yes Take 1 capsule (50,000 Units total) by mouth See admin instructions. Take one capsule by mouth on Tues/Fridays [name], MD Taking Active             SDOH:  (Social Determinants of Health) assessments and interventions performed: No SDOH Interventions    Flowsheet Row Chronic Care Management from 07/12/2022 in [facility] Clinical Support from 10/07/2019 in [facility] Clinical Support from 03/31/2019 in [facility]  SDOH Interventions     Food Insecurity Interventions Intervention Not Indicated -- --  Housing Interventions Intervention Not Indicated -- --  Utilities Interventions Intervention Not Indicated -- --  Depression Interventions/Treatment  -- PHQ2-9 Score <4 Follow-up Not Indicated PHQ2-9 Score <4 Follow-up Not Indicated  Financial Strain Interventions Intervention Not Indicated -- --  Physical Activity Interventions Patient Refused -- --  Stress Interventions Intervention Not Indicated -- --  Social Connections Interventions Intervention Not Indicated, Patient Refused -- --       Medication Assistance: Application for Ozempic  medication assistance program. in process.  Anticipated assistance start date 11/2022.  See plan of care for additional detail.  Medication Access: Within the past 30 days, how often has patient missed a dose of medication? None Is a pillbox or other method used to improve adherence? Yes  Factors that may affect medication adherence? financial need and lack of understanding of disease management Are meds synced by current pharmacy? No  Are meds delivered by current pharmacy? No  Does patient experience delays in picking up medications due to transportation concerns? No   [facility] Services Reviewed: Is patient disadvantaged to use [facility]?: No  Current Rx insurance plan:  [facility] Medicare Name and location of Current pharmacy:  [facility] [address] [place] - [address] [address] [place] [address] Phone: [phone] Fax: [phone]  [facility] services reviewed with patient today?: No  Patient requests to transfer care to [facility]?: No  Reason patient declined to change pharmacies: Not mentioned at this visit  Compliance/Adherence/Medication fill history: Care Gaps: Annual wellness visit in last year? Yes Covid booster overdue  Star-Rating Drugs: Ozempic 0.5 mg- Last filled 10-19-2022 84 DS sams. Previous 07-27-2022 28 DS. Rosuvastatin 20 mg- Last filled 08-06-2022 90 DS sams. Previous 03-01-2021 90 DS.   Assessment/Plan   Diabetes (A1c goal <7%) -Controlled -Current medications: Ozempic 0.5 once per week Appropriate, Effective, Safe, Query accessible Pending patient assistance paperwork   -Current home glucose readings:  Target Date(s)  Number of days worn ? 14 days 14  Time above >250 mg/dL <6% 0  Time above 64-403 mg/dL <47% 42%  Time in range: 70-180 mg/dL >59% 56%  Time below 70 mg/dL <3% 0%  Time below 54 mg/dL <8% 0%   -Denies hypoglycemic/hyperglycemic symptoms -Current meal patterns:  lunch: Roast beef sandwich, with Lo-Calorie Bread and cheese  dinner: Chicken [employer] with waffle fries  drinks: normally 4-5 bottles per day, but now he has cut back to 3 bottles per day, he has been drinking hot tea and hot cider -  sugar in it  -Current exercise: he has not been walking as much and there have not been as many projects.  -Educated on Carbohydrate counting and/or plate method -Counseled to check feet daily and get yearly eye exams -Recommended to continue current medication  Cherylin Mylar, CPP, PharmD Clinical Pharmacist Practitioner [facility] [phone]

## 2022-10-31 ENCOUNTER — Other Ambulatory Visit: Payer: Self-pay | Admitting: Internal Medicine

## 2022-11-08 ENCOUNTER — Ambulatory Visit (INDEPENDENT_AMBULATORY_CARE_PROVIDER_SITE_OTHER): Payer: Medicare Other | Admitting: Internal Medicine

## 2022-11-08 ENCOUNTER — Ambulatory Visit (INDEPENDENT_AMBULATORY_CARE_PROVIDER_SITE_OTHER): Payer: Medicare Other

## 2022-11-08 ENCOUNTER — Encounter: Payer: Self-pay | Admitting: Internal Medicine

## 2022-11-08 VITALS — BP 118/72 | HR 81 | Temp 97.7°F | Ht 73.0 in | Wt 298.8 lb

## 2022-11-08 VITALS — BP 118/72 | HR 81 | Temp 97.7°F | Ht 73.0 in | Wt 298.0 lb

## 2022-11-08 DIAGNOSIS — E291 Testicular hypofunction: Secondary | ICD-10-CM | POA: Diagnosis not present

## 2022-11-08 DIAGNOSIS — E66812 Obesity, class 2: Secondary | ICD-10-CM

## 2022-11-08 DIAGNOSIS — E785 Hyperlipidemia, unspecified: Secondary | ICD-10-CM

## 2022-11-08 DIAGNOSIS — L719 Rosacea, unspecified: Secondary | ICD-10-CM | POA: Diagnosis not present

## 2022-11-08 DIAGNOSIS — Z0000 Encounter for general adult medical examination without abnormal findings: Secondary | ICD-10-CM | POA: Diagnosis not present

## 2022-11-08 DIAGNOSIS — Z6839 Body mass index (BMI) 39.0-39.9, adult: Secondary | ICD-10-CM

## 2022-11-08 DIAGNOSIS — E1169 Type 2 diabetes mellitus with other specified complication: Secondary | ICD-10-CM

## 2022-11-08 MED ORDER — ROSUVASTATIN CALCIUM 20 MG PO TABS: 20.0000 mg | ORAL_TABLET | Freq: Every day | ORAL | 3 refills | Status: DC

## 2022-11-08 NOTE — Patient Instructions (Signed)
[name] , Thank you for taking time to come for your Medicare Wellness Visit. I appreciate your ongoing commitment to your health goals. Please review the following plan we discussed and let me know if I can assist you in the future.   These are the goals we discussed:  Goals      CCM (ATHEROSCLEROSIS OF AORTA) EXPECTED OUTCOME:  MONITOR, SELF-MANAGE AND REDUCE SYMPTOMS OF ATHEROSCLEROSIS OF AORTA     Current Barriers:  Knowledge Deficits related to Atherosclerosis of Aorta Chronic Disease Management support and education needs related to Atherosclerosis of Aorta, diet, exercise No Advanced Directives in place- patient requests information be mailed Patient reports he lives with spouse, is independent with all aspects of his care, continues to drive, does not follow a special diet, does not have a formal exercise routine but does yard work and other work around [employer]. Patient reports he has all medications and taking as prescribed  Planned Interventions: Assessed understanding of CAD diagnosis Medications reviewed including medications utilized in CAD treatment plan Provided education on importance of blood pressure control in management of CAD; Provided education on Importance of limiting foods high in cholesterol; Counseled on the importance of exercise goals with target of 150 minutes per week Reviewed Importance of taking all medications as prescribed Reviewed Importance of attending all scheduled provider appointments Reviewed heart healthy diet  Symptom Management: Take medications as prescribed   Attend all scheduled provider appointments Call pharmacy for medication refills 3-7 days in advance of running out of medications Attend church or other social activities Perform all self care activities independently  Perform IADL's (shopping, preparing meals, housekeeping, managing finances) independently Call provider office for new concerns or questions  Try to do some  type of exercise even if walking for a few minutes at a time Follow heart healthy diet- limit fast food Bake or broil foods instead of frying  Follow Up Plan: Telephone follow up appointment with care management team member scheduled for:  11/28/22 at 130 pm       CCM (DIABETES) EXPECTED OUTCOME:  MONITOR, SELF-MANAGE AND REDUCE SYMPTOMS OF DIABETES     Current Barriers:  Knowledge Deficits related to Diabetes management Chronic Disease Management support and education needs related to Diabetes and diet No Advanced Directives in place- patient requests information to be mailed Patient reports CBG monitored by CGM [employer] and also fingerstick at times (mostly for comparison), states CBG range is 140-150 for all blood sugar readings, fasting today is 133 and CBG much improved with recent AIC 6.8 on 08/02/22, pt attributes to being on Ozempic.  Pt reports he does not follow a special diet, drinks occasional sugary soft drink.   Patient reports he did not receive Advanced directives packet and would like to be mailed again  Planned Interventions: Reviewed medications with patient and discussed importance of medication adherence;        Reviewed prescribed diet with patient carbohydrate modified; Provided patient with written educational materials related to hypo and hyperglycemia and importance of correct treatment;       Advised patient, providing education and rationale, to check cbg per CGM [employer] [place]  and record        Review of patient status, including review of consultants reports, relevant laboratory and other test results, and medications completed;       Advised patient to discuss any issues with blood sugar with provider;      Advanced directives packet mailed (resent)  Symptom Management: Take  medications as prescribed   Attend all scheduled provider appointments Call pharmacy for medication refills 3-7 days in advance of running out of medications Attend church or  other social activities Perform all self care activities independently  Perform IADL's (shopping, preparing meals, housekeeping, managing finances) independently Call provider office for new concerns or questions  check blood sugar at prescribed times: [employer]estyle [employer]  check feet daily for cuts, sores or redness enter blood sugar readings and medication or insulin into daily log take the blood sugar log to all doctor visits take the blood sugar meter to all doctor visits trim toenails straight across fill half of plate with vegetables limit fast food meals to no more than 1 per week manage portion size prepare main meal at home 3 to 5 days each week read food labels for fat, fiber, carbohydrates and portion size wash and dry feet carefully every day wear comfortable, cotton socks Look over and complete Advanced directives packet mailed (resent)  Follow Up Plan: Telephone follow up appointment with care management team member scheduled for:  11/28/22 at 130 pm       Patient Stated     10/19/2020, wants to weigh 220-225     Patient Stated     11/01/2021, wants to continue losing weight     Patient Stated     11/08/2022, wants to eat healthier     Weight (lb) < 200 lb (90.7 kg)     03/31/2019, wants to weigh 210-220 pounds     Weight (lb) < 200 lb (90.7 kg)     10/07/2019, wants to get down to 220 pounds        This is a list of the screening recommended for you and due dates:  Health Maintenance  Topic Date Due   COVID-19 Vaccine (1) 11/24/2022*   Zoster (Shingles) Vaccine (1 of 2) 02/07/2023*   Pneumonia Vaccine (1 of 1 - PCV) 08/03/2023*   Eye exam for diabetics  12/20/2022   Hemoglobin A1C  01/31/2023   Flu Shot  02/14/2023   Complete foot exam   03/06/2023   Yearly kidney function blood test for diabetes  08/03/2023   Yearly kidney health urinalysis for diabetes  08/03/2023   Medicare Annual Wellness Visit  11/08/2023   Hepatitis C Screening: USPSTF Recommendation to  screen - Ages 18-79 yo.  Completed   HPV Vaccine  Aged Out   DTaP/Tdap/Td vaccine  Discontinued   Colon Cancer Screening  Discontinued  *Topic was postponed. The date shown is not the original due date.    Advanced directives: Advance directive discussed with you today. Even though you declined this today please call our office should you change your mind and we can give you the proper paperwork for you to fill out.  Conditions/risks identified: none  Next appointment: Follow up in one year for your annual wellness visit.   Preventive Care 25 Years and Older, Male  Preventive care refers to lifestyle choices and visits with your health care provider that can promote health and wellness. What does preventive care include? A yearly physical exam. This is also called an annual well check. Dental exams once or twice a year. Routine eye exams. Ask your health care provider how often you should have your eyes checked. Personal lifestyle choices, including: Daily care of your teeth and gums. Regular physical activity. Eating a healthy diet. Avoiding tobacco and drug use. Limiting alcohol use. Practicing safe sex. Taking low doses of aspirin every day. Taking vitamin  and mineral supplements as recommended by your health care provider. What happens during an annual well check? The services and screenings done by your health care provider during your annual well check will depend on your age, overall health, lifestyle risk factors, and family history of disease. Counseling  Your health care provider may ask you questions about your: Alcohol use. Tobacco use. Drug use. Emotional well-being. Home and relationship well-being. Sexual activity. Eating habits. History of falls. Memory and ability to understand (cognition). Work and work Astronomer. Screening  You may have the following tests or measurements: Height, weight, and BMI. Blood pressure. Lipid and cholesterol levels. These  may be checked every 5 years, or more frequently if you are over 59 years old. Skin check. Lung cancer screening. You may have this screening every year starting at age 66 if you have a 30-pack-year history of smoking and currently smoke or have quit within the past 15 years. Fecal occult blood test (FOBT) of the stool. You may have this test every year starting at age 37. Flexible sigmoidoscopy or colonoscopy. You may have a sigmoidoscopy every 5 years or a colonoscopy every 10 years starting at age 26. Prostate cancer screening. Recommendations will vary depending on your family history and other risks. Hepatitis C blood test. Hepatitis B blood test. Sexually transmitted disease (STD) testing. Diabetes screening. This is done by checking your blood sugar (glucose) after you have not eaten for a while (fasting). You may have this done every 1-3 years. Abdominal aortic aneurysm (AAA) screening. You may need this if you are a current or former smoker. Osteoporosis. You may be screened starting at age 61 if you are at high risk. Talk with your health care provider about your test results, treatment options, and if necessary, the need for more tests. Vaccines  Your health care provider may recommend certain vaccines, such as: Influenza vaccine. This is recommended every year. Tetanus, diphtheria, and acellular pertussis (Tdap, Td) vaccine. You may need a Td booster every 10 years. Zoster vaccine. You may need this after age 54. Pneumococcal 13-valent conjugate (PCV13) vaccine. One dose is recommended after age 42. Pneumococcal polysaccharide (PPSV23) vaccine. One dose is recommended after age 56. Talk to your health care provider about which screenings and vaccines you need and how often you need them. This information is not intended to replace advice given to you by your health care provider. Make sure you discuss any questions you have with your health care provider. Document Released:  07/29/2015 Document Revised: 03/21/2016 Document Reviewed: 05/03/2015 Elsevier Interactive Patient Education  2017 [employer].  Fall Prevention in the Home Falls can cause injuries. They can happen to people of all ages. There are many things you can do to make your home safe and to help prevent falls. What can I do on the outside of my home? Regularly fix the edges of walkways and driveways and fix any cracks. Remove anything that might make you trip as you walk through a door, such as a raised step or threshold. Trim any bushes or trees on the path to your home. Use bright outdoor lighting. Clear any walking paths of anything that might make someone trip, such as rocks or tools. Regularly check to see if handrails are loose or broken. Make sure that both sides of any steps have handrails. Any raised decks and porches should have guardrails on the edges. Have any leaves, snow, or ice cleared regularly. Use sand or salt on walking paths during winter. Clean  up any spills in your garage right away. This includes oil or grease spills. What can I do in the bathroom? Use night lights. Install grab bars by the toilet and in the tub and shower. Do not use towel bars as grab bars. Use non-skid mats or decals in the tub or shower. If you need to sit down in the shower, use a plastic, non-slip stool. Keep the floor dry. Clean up any water that spills on the floor as soon as it happens. Remove soap buildup in the tub or shower regularly. Attach bath mats securely with double-sided non-slip rug tape. Do not have throw rugs and other things on the floor that can make you trip. What can I do in the bedroom? Use night lights. Make sure that you have a light by your bed that is easy to reach. Do not use any sheets or blankets that are too big for your bed. They should not hang down onto the floor. Have a firm chair that has side arms. You can use this for support while you get dressed. Do not have  throw rugs and other things on the floor that can make you trip. What can I do in the kitchen? Clean up any spills right away. Avoid walking on wet floors. Keep items that you use a lot in easy-to-reach places. If you need to reach something above you, use a strong step stool that has a grab bar. Keep electrical cords out of the way. Do not use floor polish or wax that makes floors slippery. If you must use wax, use non-skid floor wax. Do not have throw rugs and other things on the floor that can make you trip. What can I do with my stairs? Do not leave any items on the stairs. Make sure that there are handrails on both sides of the stairs and use them. Fix handrails that are broken or loose. Make sure that handrails are as long as the stairways. Check any carpeting to make sure that it is firmly attached to the stairs. Fix any carpet that is loose or worn. Avoid having throw rugs at the top or bottom of the stairs. If you do have throw rugs, attach them to the floor with carpet tape. Make sure that you have a light switch at the top of the stairs and the bottom of the stairs. If you do not have them, ask someone to add them for you. What else can I do to help prevent falls? Wear shoes that: Do not have high heels. Have rubber bottoms. Are comfortable and fit you well. Are closed at the toe. Do not wear sandals. If you use a stepladder: Make sure that it is fully opened. Do not climb a closed stepladder. Make sure that both sides of the stepladder are locked into place.Advance directive discussed with you today. Even though you declined this today please call our office should you change your mind and we can give you the proper paperwork for you to fill out. Ask someone to hold it for you, if possible. Clearly mark and make sure that you can see: Any grab bars or handrails. First and last steps. Where the edge of each step is. Use tools that help you move around (mobility aids) if they  are needed. These include: Canes. Walkers. Scooters. Crutches. Turn on the lights when you go into a dark area. Replace any light bulbs as soon as they burn out. Set up your furniture so you have a  clear path. Avoid moving your furniture around. If any of your floors are uneven, fix them. If there are any pets around you, be aware of where they are. Review your medicines with your doctor. Some medicines can make you feel dizzy. This can increase your chance of falling. Ask your doctor what other things that you can do to help prevent falls. This information is not intended to replace advice given to you by your health care provider. Make sure you discuss any questions you have with your health care provider. Document Released: 04/28/2009 Document Revised: 12/08/2015 Document Reviewed: 08/06/2014 Elsevier Interactive Patient Education  2017 [employer].

## 2022-11-08 NOTE — Progress Notes (Signed)
Subjective:   Connor Becker. is a 72 y.o. male who presents for Medicare Annual/Subsequent preventive examination.  Review of Systems     Cardiac Risk Factors include: advanced age (>62men, >73 women);diabetes mellitus;dyslipidemia;male gender;obesity (BMI >30kg/m2)     Objective:    Today's Vitals   11/08/22 1358  BP: 118/72  Pulse: 81  Temp: 97.7 F (36.5 C)  TempSrc: Oral  SpO2: 95%  Weight: 298 lb 12.8 oz (135.5 kg)  Height: 6\' 1"  (1.854 m)   Body mass index is 39.42 kg/m.     11/08/2022    2:09 PM 07/12/2022    2:17 PM 11/01/2021    3:41 PM 03/18/2021    7:50 AM 03/16/2021    5:01 PM 10/19/2020    9:55 AM 09/23/2020    4:28 PM  Advanced Directives  Does Patient Have a Medical Advance Directive? No No No No No No No  Would patient like information on creating a medical advance directive? No - Patient declined Yes (MAU/Ambulatory/Procedural Areas - Information given) No - Patient declined No - Patient declined       Current Medications (verified) Outpatient Encounter Medications as of 11/08/2022  Medication Sig   albuterol (VENTOLIN HFA) 108 (90 Base) MCG/ACT inhaler Inhale 2 puffs into the lungs every 6 (six) hours as needed for wheezing or shortness of breath.   azelastine (ASTELIN) 0.1 % nasal spray Place 2 sprays into both nostrils 2 (two) times daily. Use in each nostril as directed   Continuous Blood Gluc Receiver (FREESTYLE LIBRE 2 READER) DEVI 1 Device by Does not apply route as directed.   Continuous Blood Gluc Sensor (FREESTYLE LIBRE 2 SENSOR) MISC 1 Device by Other route every 14 (fourteen) days.   EPINEPHrine 0.3 mg/0.3 mL IJ SOAJ injection Inject 0.3 mg into the muscle as needed for anaphylaxis.   fluticasone (FLONASE) 50 MCG/ACT nasal spray Place 2 sprays into both nostrils daily.   gabapentin (NEURONTIN) 300 MG capsule Take 1 capsule (300 mg total) by mouth at bedtime.   latanoprost (XALATAN) 0.005 % ophthalmic solution Place 1 drop into both eyes  daily.   levocetirizine (XYZAL) 5 MG tablet SMARTSIG:1 Tablet(s) By Mouth Every Evening   levothyroxine (SYNTHROID) 112 MCG tablet Take 1 tablet by mouth once daily   lisinopril (ZESTRIL) 5 MG tablet Take 1 tablet by mouth once daily   loperamide (IMODIUM) 2 MG capsule Take 1 capsule (2 mg total) by mouth 4 (four) times daily as needed for diarrhea or loose stools.   omeprazole (PRILOSEC) 40 MG capsule Take 1 capsule (40 mg total) by mouth daily.   ondansetron (ZOFRAN ODT) 4 MG disintegrating tablet Take 1 tablet (4 mg total) by mouth every 8 (eight) hours as needed for nausea or vomiting.   ONETOUCH VERIO test strip USE  STRIP TO CHECK GLUCOSE 4 TIMES DAILY   promethazine (PHENERGAN) 12.5 MG tablet Take 1 tablet (12.5 mg total) by mouth every 6 (six) hours as needed for nausea or vomiting.   pseudoephedrine (SUDAFED) 30 MG tablet Take 60 mg by mouth 2 (two) times daily as needed for congestion.   rosuvastatin (CRESTOR) 20 MG tablet Take 1 tablet (20 mg total) by mouth daily.   Semaglutide,0.25 or 0.5MG /DOS, 2 MG/3ML SOPN Inject 0.5 mg into the skin once a week.   tadalafil (CIALIS) 5 MG tablet Take 5 mg by mouth daily.   timolol (TIMOPTIC) 0.5 % ophthalmic solution Place 1 drop into both eyes daily.   Turmeric 500  MG CAPS Take 500 mg by mouth daily.   vitamin C (ASCORBIC ACID) 500 MG tablet Take 1,000 mg by mouth daily.   Vitamin D, Ergocalciferol, (DRISDOL) 1.25 MG (50000 UNIT) CAPS capsule Take 1 capsule (50,000 Units total) by mouth See admin instructions. Take one capsule by mouth on Tues/Fridays   amoxicillin-clavulanate (AUGMENTIN) 875-125 MG tablet Take 1 tablet by mouth 2 (two) times daily. (Patient not taking: Reported on 09/11/2022)   Gabapentin 10 % CREA Apply 1 Application topically at bedtime. (Patient not taking: Reported on 11/08/2022)   HYDROcodone-acetaminophen (NORCO/VICODIN) 5-325 MG tablet Take 2 tablets by mouth every 4 (four) hours as needed. (Patient not taking: Reported on  11/08/2022)   montelukast (SINGULAIR) 10 MG tablet Take 1 tablet (10 mg total) by mouth daily.   testosterone enanthate (DELATESTRYL) 200 MG/ML injection Inject 200 mg into the muscle every 14 (fourteen) days. (Patient not taking: Reported on 09/11/2022)   No facility-administered encounter medications on file as of 11/08/2022.    Allergies (verified) Horseradish [armoracia rusticana ext (horseradish)], Lipitor [atorvastatin], Metformin and related, Semaglutide(0.25 or 0.5mg -dos), and Soy allergy   History: Past Medical History:  Diagnosis Date   ALLERGIC RHINITIS    Chronic pain of both knees    DJD (degenerative joint disease) of knee    right   DM2 (diabetes mellitus, type 2)    GERD    Glaucoma    History of nuclear stress test    Myoview 12/17: EF 53, no ischemia, Low Risk   HYPERLIPIDEMIA    myalgias from Lipitor   HYPOGONADISM, MALE    Hypothyroidism    INSOMNIA-SLEEP DISORDER-UNSPEC    Joint pain    Obesity    OSTEOARTHRITIS    SLEEP APNEA, OBSTRUCTIVE    no CPAP; repeat sleep test pending 07/2016   Unspecified hypothyroidism 12/04/2013   Vitamin D deficiency    Past Surgical History:  Procedure Laterality Date   NASAL SEPTUM SURGERY     TONSILLECTOMY     UVULOPALATOPHARYNGOPLASTY     VASECTOMY     Family History  Problem Relation Age of Onset   Heart attack Mother 68       s/p MI   Other Mother        pacemaker   Diabetes Mother    High blood pressure Mother    High Cholesterol Mother    Thyroid disease Mother    Obesity Mother    Macular degeneration Father    Thyroid disease Father    Diabetes Maternal Uncle    Heart attack Maternal Uncle    Hypertension Other    Prostate cancer Other    Heart disease Other    Colon cancer Neg Hx    Social History   Socioeconomic History   Marital status: Married    Spouse name: Connor Becker   Number of children: 2   Years of education: Not on file   Highest education level: Not on file  Occupational  History   Occupation: retired  Tobacco Use   Smoking status: Never   Smokeless tobacco: Never  Vaping Use   Vaping Use: Never used  Substance and Sexual Activity   Alcohol use: Not Currently    Comment: occassional beer   Drug use: No   Sexual activity: Yes  Other Topics Concern   Not on file  Social History Narrative   Married    2 children   Semi-retired   Holiday representative; gen Surveyor, minerals   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   Social Determinants of Health   Financial Resource Strain: Low Risk  (11/08/2022)   Overall Financial Resource Strain (CARDIA)    Difficulty of Paying Living Expenses: Not hard at all  Food Insecurity: No Food Insecurity (11/08/2022)   Hunger Vital Sign    Worried About Running Out of Food in the Last Year: Never true    Ran Out of Food in the Last Year: Never true  Transportation Needs: No Transportation Needs (11/08/2022)   PRAPARE - Administrator, Civil Service (Medical): No    Lack of Transportation (Non-Medical): No  Physical Activity: Inactive (11/08/2022)   Exercise Vital Sign    Days of Exercise per Week: 0 days    Minutes of Exercise per Session: 0 min  Stress: No Stress Concern Present (11/08/2022)   [employer] of Occupational Health - Occupational Stress Questionnaire    Feeling of Stress : Not at all  Social Connections: Moderately Isolated (07/12/2022)   Social Connection and Isolation Panel [NHANES]    Frequency of Communication with Friends and Family: More than three times a week    Frequency of Social Gatherings with Friends and Family: More than three times a week    Attends Religious Services: Never    Database administrator or Organizations: No    Attends Engineer, structural: Never    Marital Status: Married    Tobacco Counseling Counseling given: Not Answered   Clinical Intake:  Pre-visit preparation completed: Yes  Pain : No/denies pain     Nutritional Risks:  None Diabetes: Yes  How often do you need to have someone help you when you read instructions, pamphlets, or other written materials from your doctor or pharmacy?: 1 - Never  Diabetic? Yes Nutrition Risk Assessment:  Has the patient had any N/V/D within the last 2 months?  No  Does the patient have any non-healing wounds?  No  Has the patient had any unintentional weight loss or weight gain?  No   Diabetes:  Is the patient diabetic?  Yes  If diabetic, was a CBG obtained today?  No  Did the patient bring in their glucometer from home?  No  How often do you monitor your CBG's? frequently.   Financial Strains and Diabetes Management:  Are you having any financial strains with the device, your supplies or your medication? No .  Does the patient want to be seen by Chronic Care Management for management of their diabetes?  No  Would the patient like to be referred to a Nutritionist or for Diabetic Management?  No   Diabetic Exams:  Diabetic Eye Exam: Completed 12/19/2021 Diabetic Foot Exam: Completed 03/05/2022   Interpreter Needed?: No  Information entered by :: NAllen LPN   Activities of Daily Living    11/08/2022    2:10 PM  In your present state of health, do you have any difficulty performing the following activities:  Hearing? 1  Comment has buzzing in ears  Vision? 1  Comment has some glare  Difficulty concentrating or making decisions? 0  Walking or climbing stairs? 0  Dressing or bathing? 0  Doing errands, shopping? 0  Preparing Food and eating ? N  Using the Toilet? N  In the past six months, have you accidently leaked urine? Y  Do you have problems with loss of bowel control? N  Managing your Medications? N  Managing your Finances? N  Housekeeping or managing your Housekeeping? N  Patient Care Team: [name], MD as PCP - General (Internal Medicine) Audrie Gallus, RN as [facility] Care Management  Indicate any recent Medical  Services you may have received from other than [facility] providers in the past year (date may be approximate).     Assessment:   This is a routine wellness examination for Connor Becker.  Hearing/Vision screen Vision Screening - Comments:: Regular eye exams, Connor Becker  Dietary issues and exercise activities discussed: Current Exercise Habits: The patient does not participate in regular exercise at present   Goals Addressed             This Visit's Progress    Patient Stated       11/08/2022, wants to eat healthier       Depression Screen    11/08/2022    2:09 PM 08/02/2022   10:55 AM 07/12/2022    2:27 PM 11/01/2021    3:42 PM 10/26/2021    9:58 AM 10/19/2020    9:56 AM 01/05/2020   12:49 PM  PHQ 2/9 Scores  PHQ - 2 Score 0 0 0 0 0 0 0    Fall Risk    11/08/2022    2:09 PM 08/02/2022   10:55 AM 07/12/2022    2:15 PM 07/12/2022    2:10 PM 11/01/2021    3:41 PM  Fall Risk   Falls in the past year? 0 0 0 0 0  Number falls in past yr: 0 0   0  Injury with Fall? 0 0   0  Risk for fall due to : Medication side effect No Fall Risks   Medication side effect  Follow up Falls prevention discussed;Education provided;Falls evaluation completed Falls evaluation completed   Education provided;Falls evaluation completed;Falls prevention discussed    FALL RISK PREVENTION PERTAINING TO THE HOME:  Any stairs in or around the home? Yes  If so, are there any without handrails? No  Home free of loose throw rugs in walkways, pet beds, electrical cords, etc? Yes  Adequate lighting in your home to reduce risk of falls? Yes   ASSISTIVE DEVICES UTILIZED TO PREVENT FALLS:  Life alert? No  Use of a cane, walker or w/c? No  Grab bars in the bathroom? No  Shower chair or bench in shower? No  Elevated toilet seat or a handicapped toilet? Yes   TIMED UP AND GO:  Was the test performed? Yes .  Length of time to ambulate 10 feet: 5 sec.   Gait steady and fast without use of assistive  device  Cognitive Function:        11/08/2022    2:11 PM 11/01/2021    3:43 PM 10/19/2020    9:58 AM 10/07/2019    3:14 PM 03/31/2019    9:53 AM  6CIT Screen  What Year? 0 points 0 points 0 points 0 points 0 points  What month? 0 points 0 points 0 points 0 points 0 points  What time? 0 points 0 points 0 points 0 points 0 points  Count back from 20 0 points 0 points 0 points 0 points 0 points  Months in reverse 0 points 0 points 0 points 0 points 0 points  Repeat phrase 0 points 0 points 0 points 0 points 0 points  Total Score 0 points 0 points 0 points 0 points 0 points    Immunizations Immunization History  Administered Date(s) Administered   Tdap 10/31/2010    TDAP status: Due, Education has  been provided regarding the importance of this vaccine. Advised may receive this vaccine at local pharmacy or Health Dept. Aware to provide a copy of the vaccination record if obtained from local pharmacy or Health Dept. Verbalized acceptance and understanding.  Flu Vaccine status: Declined, Education has been provided regarding the importance of this vaccine but patient still declined. Advised may receive this vaccine at local pharmacy or Health Dept. Aware to provide a copy of the vaccination record if obtained from local pharmacy or Health Dept. Verbalized acceptance and understanding.  Pneumococcal vaccine status: Declined,  Education has been provided regarding the importance of this vaccine but patient still declined. Advised may receive this vaccine at local pharmacy or Health Dept. Aware to provide a copy of the vaccination record if obtained from local pharmacy or Health Dept. Verbalized acceptance and understanding.   Covid-19 vaccine status: Declined, Education has been provided regarding the importance of this vaccine but patient still declined. Advised may receive this vaccine at local pharmacy or Health Dept.or vaccine clinic. Aware to provide a copy of the vaccination record if  obtained from local pharmacy or Health Dept. Verbalized acceptance and understanding.  Qualifies for Shingles Vaccine? Yes   Zostavax completed No   Shingrix Completed?: No.    Education has been provided regarding the importance of this vaccine. Patient has been advised to call insurance company to determine out of pocket expense if they have not yet received this vaccine. Advised may also receive vaccine at local pharmacy or Health Dept. Verbalized acceptance and understanding.  Screening Tests Health Maintenance  Topic Date Due   Medicare Annual Wellness (AWV)  11/02/2022   COVID-19 Vaccine (1) 11/24/2022 (Originally 01/26/1956)   Zoster Vaccines- Shingrix (1 of 2) 02/07/2023 (Originally 01/27/1970)   Pneumonia Vaccine 43+ Years old (1 of 1 - PCV) 08/03/2023 (Originally 01/26/2014)   OPHTHALMOLOGY EXAM  12/20/2022   HEMOGLOBIN A1C  01/31/2023   INFLUENZA VACCINE  02/14/2023   FOOT EXAM  03/06/2023   Diabetic kidney evaluation - eGFR measurement  08/03/2023   Diabetic kidney evaluation - Urine ACR  08/03/2023   Hepatitis C Screening  Completed   HPV VACCINES  Aged Out   DTaP/Tdap/Td  Discontinued   COLONOSCOPY (Pts 45-70yrs Insurance coverage will need to be confirmed)  Discontinued    Health Maintenance  Health Maintenance Due  Topic Date Due   Medicare Annual Wellness (AWV)  11/02/2022    Colorectal cancer screening: Type of screening: Colonoscopy. Completed 05/03/2021. Repeat every 5 years  Lung Cancer Screening: (Low Dose CT Chest recommended if Age 63-80 years, 30 pack-year currently smoking OR have quit w/in 15years.) does not qualify.   Lung Cancer Screening Referral: no  Additional Screening:  Hepatitis C Screening: does qualify; Completed 11/17/2015  Vision Screening: Recommended annual ophthalmology exams for early detection of glaucoma and other disorders of the eye. Is the patient up to date with their annual eye exam?  Yes  Who is the provider or what is the name  of the office in which the patient attends annual eye exams? Connor Becker If pt is not established with a provider, would they like to be referred to a provider to establish care? No .   Dental Screening: Recommended annual dental exams for proper oral hygiene  Community Resource Referral / Chronic Care Management: CRR required this visit?  No   CCM required this visit?  No      Plan:     I have personally reviewed and noted  the following in the patient's chart:   Medical and social history Use of alcohol, tobacco or illicit drugs  Current medications and supplements including opioid prescriptions. Patient is not currently taking opioid prescriptions. Functional ability and status Nutritional status Physical activity Advanced directives List of other physicians Hospitalizations, surgeries, and ER visits in previous 12 months Vitals Screenings to include cognitive, depression, and falls Referrals and appointments  In addition, I have reviewed and discussed with patient certain preventive protocols, quality metrics, and best practice recommendations. A written personalized care plan for preventive services as well as general preventive health recommendations were provided to patient.     Barb Merino, LPN   [id number]/43/[id number]   Nurse Notes: none

## 2022-11-08 NOTE — Patient Instructions (Signed)

## 2022-11-08 NOTE — Progress Notes (Addendum)
Connor Becker,acting as a Neurosurgeon for Connor Aliment, MD.,have documented all relevant documentation on the behalf of Connor Aliment, MD,as directed by  Connor Aliment, MD while in the presence of Connor Aliment, MD.    Subjective:     Patient ID: Connor Becker , male    DOB: 07-Feb-1951 , 72 y.o.   MRN: [id number]   Chief Complaint  Patient presents with   Diabetes   Hyperlipidemia    HPI  Patient presents today for a diabetes & chol check. Patient reports compliance with his medications. He has not seen Endo since his last visit here. He reports compliance with meds.   BP Readings from Last 3 Encounters: 11/08/22 : 118/72 11/08/22 : 118/72 08/06/22 : 114/80    Diabetes He presents for his follow-up diabetic visit. He has type 2 diabetes mellitus. His disease course has been stable. There are no hypoglycemic associated symptoms. Pertinent negatives for diabetes include no polydipsia, no polyphagia and no polyuria. There are no hypoglycemic complications. Risk factors for coronary artery disease include diabetes mellitus, dyslipidemia, male sex, obesity and sedentary lifestyle. He participates in exercise intermittently. An ACE inhibitor/angiotensin II receptor blocker is being taken. Eye exam is not current.  Hyperlipidemia This is a chronic problem. The current episode started more than 1 year ago. The problem is controlled. Exacerbating diseases include diabetes, hypothyroidism and obesity. Pertinent negatives include no shortness of breath. Current antihyperlipidemic treatment includes statins. The current treatment provides moderate improvement of lipids.     Past Medical History:  Diagnosis Date   ALLERGIC RHINITIS    Chronic pain of both knees    DJD (degenerative joint disease) of knee    right   DM2 (diabetes mellitus, type 2) (HCC)    GERD    Glaucoma    History of nuclear stress test    Myoview 12/17: EF 53, no ischemia, Low Risk   HYPERLIPIDEMIA     myalgias from Lipitor   HYPOGONADISM, MALE    Hypothyroidism    INSOMNIA-SLEEP DISORDER-UNSPEC    Joint pain    Obesity    OSTEOARTHRITIS    SLEEP APNEA, OBSTRUCTIVE    no CPAP; repeat sleep test pending 07/2016   Unspecified hypothyroidism 12/04/2013   Vitamin D deficiency      Family History  Problem Relation Age of Onset   Heart attack Mother 21       s/p MI   Other Mother        pacemaker   Diabetes Mother    High blood pressure Mother    High Cholesterol Mother    Thyroid disease Mother    Obesity Mother    Macular degeneration Father    Thyroid disease Father    Diabetes Maternal Uncle    Heart attack Maternal Uncle    Hypertension Other    Prostate cancer Other    Heart disease Other    Colon cancer Neg Hx      Current Outpatient Medications:    albuterol (VENTOLIN HFA) 108 (90 Base) MCG/ACT inhaler, Inhale 2 puffs into the lungs every 6 (six) hours as needed for wheezing or shortness of breath., Disp: 8 g, Rfl: 2   amoxicillin-clavulanate (AUGMENTIN) 875-125 MG tablet, Take 1 tablet by mouth 2 (two) times daily. (Patient not taking: Reported on 09/11/2022), Disp: 14 tablet, Rfl: 0   azelastine (ASTELIN) 0.1 % nasal spray, Place 2 sprays into both nostrils 2 (two) times daily. Use in each nostril  as directed, Disp: 30 mL, Rfl: 2   Continuous Blood Gluc Receiver (FREESTYLE [place] 2 READER) DEVI, 1 Device by Does not apply route as directed., Disp: 1 each, Rfl: 0   Continuous Blood Gluc Sensor (FREESTYLE LIB[place] 2 SENSOR) MISC, 1 Device by Other route every 14 (fourteen) days., Disp: 6 each, Rfl: 3   EPINEPHrine 0.3 mg/0.3 mL IJ SOAJ injection, Inject 0.3 mg into the muscle as needed for anaphylaxis., Disp: 1 each, Rfl: 0   fluticasone (FLONASE) 50 MCG/ACT nasal spray, Place 2 sprays into both nostrils daily., Disp: 16 g, Rfl: 0   gabapentin (NEURONTIN) 300 MG capsule, Take 1 capsule (300 mg total) by mouth at bedtime., Disp: 90 capsule, Rfl: 3   Gabapentin 10 % CREA, Apply  1 Application topically at bedtime. (Patient not taking: Reported on 11/08/2022), Disp: 30 g, Rfl: 3   HYDROcodone-acetaminophen (NORCO/VICODIN) 5-325 MG tablet, Take 2 tablets by mouth every 4 (four) hours as needed. (Patient not taking: Reported on 11/08/2022), Disp: 6 tablet, Rfl: 0   latanoprost (XALATAN) 0.005 % ophthalmic solution, Place 1 drop into both eyes daily., Disp: , Rfl:    levocetirizine (XYZAL) 5 MG tablet, SMARTSIG:1 Tablet(s) By Mouth Every Evening, Disp: , Rfl:    levothyroxine (SYNTHROID) 112 MCG tablet, Take 1 tablet by mouth once daily, Disp: 90 tablet, Rfl: 0   lisinopril (ZESTRIL) 5 MG tablet, Take 1 tablet by mouth once daily, Disp: 90 tablet, Rfl: 0   loperamide (IMODIUM) 2 MG capsule, Take 1 capsule (2 mg total) by mouth 4 (four) times daily as needed for diarrhea or loose stools., Disp: 12 capsule, Rfl: 0   montelukast (SINGULAIR) 10 MG tablet, Take 1 tablet (10 mg total) by mouth daily., Disp: 30 tablet, Rfl: 2   omeprazole (PRILOSEC) 40 MG capsule, Take 1 capsule (40 mg total) by mouth daily., Disp: 60 capsule, Rfl: 5   ondansetron (ZOFRAN ODT) 4 MG disintegrating tablet, Take 1 tablet (4 mg total) by mouth every 8 (eight) hours as needed for nausea or vomiting., Disp: 20 tablet, Rfl: 0   ONETOUCH VERIO test strip, USE  STRIP TO CHECK GLUCOSE 4 TIMES DAILY, Disp: 200 each, Rfl: 0   promethazine (PHENERGAN) 12.5 MG tablet, Take 1 tablet (12.5 mg total) by mouth every 6 (six) hours as needed for nausea or vomiting., Disp: 6 tablet, Rfl: 0   pseudoephedrine (SUDAFED) 30 MG tablet, Take 60 mg by mouth 2 (two) times daily as needed for congestion., Disp: , Rfl:    rosuvastatin (CRESTOR) 20 MG tablet, Take 1 tablet (20 mg total) by mouth daily., Disp: 90 tablet, Rfl: 3   Semaglutide,0.25 or 0.5MG /DOS, 2 MG/3ML SOPN, Inject 0.5 mg into the skin once a week., Disp: 9 mL, Rfl: 3   tadalafil (CIALIS) 5 MG tablet, Take 5 mg by mouth daily., Disp: , Rfl: 11   testosterone enanthate  (DELATESTRYL) 200 MG/ML injection, Inject 200 mg into the muscle every 14 (fourteen) days. (Patient not taking: Reported on 09/11/2022), Disp: , Rfl:    timolol (TIMOPTIC) 0.5 % ophthalmic solution, Place 1 drop into both eyes daily., Disp: , Rfl: 2   Turmeric 500 MG CAPS, Take 500 mg by mouth daily., Disp: , Rfl:    vitamin C (ASCORBIC ACID) 500 MG tablet, Take 1,000 mg by mouth daily., Disp: , Rfl:    Vitamin D, Ergocalciferol, (DRISDOL) 1.25 MG (50000 UNIT) CAPS capsule, Take 1 capsule (50,000 Units total) by mouth See admin instructions. Take one capsule by mouth  on Tues/Fridays, Disp: 24 capsule, Rfl: 0   Allergies  Allergen Reactions   Horseradish [Armoracia Rusticana Ext (Horseradish)]    Lipitor [Atorvastatin] Other (See Comments)    Leg cramps   Metformin And Related Other (See Comments)    GI upset   Semaglutide(0.25 Or 0.5mg -Dos) Nausea And Vomiting   Soy Allergy Other (See Comments)    Pt stated (SOY SAUCE) causes angioedema, but has added some soy products back in to diet and no angioedema.      Review of Systems  Constitutional: Negative.   Respiratory: Negative.  Negative for shortness of breath.   Cardiovascular: Negative.   Gastrointestinal: Negative.   Endocrine: Negative.  Negative for polydipsia, polyphagia and polyuria.  Musculoskeletal: Negative.   Skin: Negative.   Allergic/Immunologic: Negative.   Neurological: Negative.   Hematological: Negative.      Today's Vitals   11/08/22 1408  BP: 118/72  Pulse: 81  Temp: 97.7 F (36.5 C)  TempSrc: Oral  Weight: 298 lb (135.2 kg)  Height: 6\' 1"  (1.854 m)  PainSc: 0-No pain   Body mass index is 39.32 kg/m.  Wt Readings from Last 3 Encounters:  11/08/22 298 lb (135.2 kg)  11/08/22 298 lb 12.8 oz (135.5 kg)  08/06/22 288 lb (130.6 kg)    Objective:  Physical Exam Vitals and nursing note reviewed.  Constitutional:      Appearance: Normal appearance. He is obese.  HENT:     Head: Normocephalic and  atraumatic.  Eyes:     Extraocular Movements: Extraocular movements intact.  Cardiovascular:     Rate and Rhythm: Normal rate and regular rhythm.     Heart sounds: Normal heart sounds.  Pulmonary:     Effort: Pulmonary effort is normal.     Breath sounds: Normal breath sounds.  Musculoskeletal:     Cervical back: Normal range of motion.  Skin:    General: Skin is warm.     Findings: Erythema present.     Comments: Erythema noted b/l cheeks, telangiectasia noted. No pustular lesions noted.   Neurological:     General: No focal deficit present.     Mental Status: He is alert.  Psychiatric:        Mood and Affect: Mood normal.      Assessment And Plan:     1. Hyperlipidemia due to type 2 diabetes mellitus (HCC) Comments: Chronic, I will check labs as below. LDL goal < 70. He will c/w rosuvastatin. He is encouraged to keep upcoming appt with Endo. - Hemoglobin A1c - BMP8+EGFR  2. Rosacea Comments: He should use mild cleanser like Cerave and wear sunscreen.  3. Hypogonadism in male Comments: Chronic, I will refer him to Urology for further evaluation. - Ambulatory referral to Urology  4. Class 2 severe obesity with body mass index (BMI) of 35 to 39.9 with serious comorbidity (HCC) Comments: BMI 39. He is encouraged to aim for at least 150 minutes of exercise/week, while striving for BMI<30 to decrease cardiac risk.   Patient was given opportunity to ask questions. Patient verbalized understanding of the plan and was able to repeat key elements of the plan. All questions were answered to their satisfaction.   I, Connor Aliment, MD, have reviewed all documentation for this visit. The documentation on 11/11/22 for the exam, diagnosis, procedures, and orders are all accurate and complete.   IF YOU HAVE BEEN REFERRED TO A SPECIALIST, IT MAY TAKE 1-2 WEEKS TO SCHEDULE/PROCESS THE REFERRAL. IF YOU HAVE NOT  HEARD FROM US/SPECIALIST IN TWO WEEKS, PLEASE GIVE [place] A CALL AT [phone] X  [phone].   THE PATIENT IS ENCOURAGED TO PRACTICE SOCIAL DISTANCING DUE TO THE COVID-19 PANDEMIC.

## 2022-11-09 LAB — BMP8+EGFR
BUN/Creatinine Ratio: 13 (ref 10–24)
BUN: 12 mg/dL (ref 8–27)
CO2: 25 mmol/L (ref 20–29)
Calcium: 8.6 mg/dL (ref 8.6–10.2)
Chloride: 102 mmol/L (ref 96–106)
Creatinine, Ser: 0.91 mg/dL (ref 0.76–1.27)
Glucose: 87 mg/dL (ref 70–99)
Potassium: 4.6 mmol/L (ref 3.5–5.2)
Sodium: 140 mmol/L (ref 134–144)
eGFR: 89 mL/min/{1.73_m2} (ref >59)

## 2022-11-09 LAB — HEMOGLOBIN A1C
Est. average glucose Bld gHb Est-mCnc: 151 mg/dL
Hgb A1c MFr Bld: 6.9 % — ABNORMAL HIGH (ref 4.8–5.6)

## 2022-11-12 ENCOUNTER — Other Ambulatory Visit (HOSPITAL_COMMUNITY): Payer: Self-pay

## 2022-11-12 ENCOUNTER — Telehealth: Payer: Self-pay

## 2022-11-12 NOTE — Telephone Encounter (Signed)
Patient Advocate Encounter   Received notification from [employer] Medicare Part D that prior authorization is required for [facility] 2 Sensor   Submitted: 11-12-2022 Key [id number]  Status is pending

## 2022-11-16 ENCOUNTER — Telehealth: Payer: Self-pay

## 2022-11-16 NOTE — Progress Notes (Signed)
Care Management & Coordination Services Pharmacy Team  Reason for Encounter: Appointment Reminder  Contacted patient to confirm telephone appointment with Cherylin Mylar, PharmD on 11-20-2022 at 10:30. Spoke with family on 11/16/2022   Do you have any problems getting your medications? No  What is your top health concern you would like to discuss at your upcoming visit? Patient's wife stated no concerns  Have you seen any other providers since your last visit with PCP? No   Chart review: Recent office visits:  11-08-2022 [name], MD (PCP). Referral placed to urology.  11-08-2022 Barb Merino, LPN (PCP). Annual wellness visit.  Recent consult visits:  None  Hospital visits:  None in previous 6 months   Star Rating Drugs:  Ozempic 0.5 mg- Last filled 10-19-2022 84 DS sams. Previous 07-27-2022 28 DS. Rosuvastatin 20 mg- Last filled 11-08-2022 90 DS sams. Previous 08-06-2022 90 DS.    Care Gaps: Annual wellness visit in last year? Yes  If Diabetic: Last eye exam / retinopathy screening:12-2021  Last diabetic foot exam: None   Huey Romans CMA Clinical Pharmacist Assistant [phone]

## 2022-11-23 NOTE — Telephone Encounter (Signed)
Decision Notes: Continuous glucose monitor system (receiver, transmitter, and sensor) is denied for not meeting the prior authorization requirement(s). Product authorization requires the following: (1) Submission of medical records (for example: chart notes, laboratory values) or claims history documenting one of the following: (A) You are being treated with insulin. (B) You have a history of problematic hypoglycemia with documentation of recurrent level 2 hypoglycemic events [glucose less than 54mg /dl (3.0 mmol/L)] that persist despite multiple attempts to adjust medication(s) and/or modify the diabetes treatment plan. (C) You have a history of problematic hypoglycemia with documentation of a history of a level 3 hypoglycemic event [glucose less than 54mg /dl (3.0 mmol/L)] characterized by altered mental and/or physical state requiring third-party assistance for treatment of hypoglycemia. Reviewed by: Reatha HarpsPh. **Please note: This review applies to [employer], Dexcom G7, Freestyle [place], [address], [address], and [place] [address].

## 2022-11-28 ENCOUNTER — Ambulatory Visit (INDEPENDENT_AMBULATORY_CARE_PROVIDER_SITE_OTHER): Payer: Medicare Other | Admitting: *Deleted

## 2022-11-28 DIAGNOSIS — I700 Atherosclerosis of aorta: Secondary | ICD-10-CM

## 2022-11-28 DIAGNOSIS — E1165 Type 2 diabetes mellitus with hyperglycemia: Secondary | ICD-10-CM

## 2022-11-28 NOTE — Patient Instructions (Signed)
Please call the care guide team at [phone] if you need to cancel or reschedule your appointment.   If you are experiencing a Mental Health or Behavioral Health Crisis or need someone to talk to, please call the Suicide and Crisis Lifeline: 988 call the [place] National Suicide Prevention Lifeline: 484-681-3250 or TTY: (626) 579-7440 TTY 585 472 1184) to talk to a trained counselor call 1-800-273-TALK (toll free, 24 hour hotline) go to [facility] [address] [place] [phone]) call 911   Following is a copy of the CCM Program Consent:  CCM service includes personalized support from designated clinical staff supervised by the physician, including individualized plan of care and coordination with other care providers 24/7 contact phone numbers for assistance for urgent and routine care needs. Service will only be billed when office clinical staff spend 20 minutes or more in a month to coordinate care. Only one practitioner may furnish and bill the service in a calendar month. The patient may stop CCM services at amy time (effective at the end of the month) by phone call to the office staff. The patient will be responsible for cost sharing (co-pay) or up to 20% of the service fee (after annual deductible is met)  Following is a copy of your full provider care plan:   Goals Addressed             This Visit's Progress    CCM (ATHEROSCLEROSIS OF AORTA) EXPECTED OUTCOME:  MONITOR, SELF-MANAGE AND REDUCE SYMPTOMS OF ATHEROSCLEROSIS OF AORTA       Current Barriers:  Knowledge Deficits related to Atherosclerosis of Aorta Chronic Disease Management support and education needs related to Atherosclerosis of Aorta, diet, exercise No Advanced Directives in place- information previously mailed Patient reports he lives with spouse, is independent with all aspects of his care, continues to drive, does not follow a special diet, does not have a formal  exercise routine but does yard work and other work around the house and 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  Patient reports he has all medications and taking as prescribed  Planned Interventions: Medications reviewed including medications utilized in CAD treatment plan Provided education on importance of blood pressure control in management of CAD; Provided education on Importance of limiting foods high in cholesterol; Counseled on the importance of exercise goals with target of 150 minutes per week Reviewed Importance of taking all medications as prescribed Reviewed Importance of attending all scheduled provider appointments Reinforced heart healthy diet and nutritious food choices  Symptom Management: Take medications as prescribed   Attend all scheduled provider appointments Call pharmacy for medication refills 3-7 days in advance of running out of medications Attend church or other social activities Perform all self care activities independently  Perform IADL's (shopping, preparing meals, housekeeping, managing finances) independently Call provider office for new concerns or questions  Try to do some type of exercise even if walking for a few minutes at a time Follow heart healthy diet- limit fast food Bake or broil foods instead of frying  Follow Up Plan: Telephone follow up appointment with care management team member scheduled for:   02/26/23 at 130 pm       CCM (DIABETES) EXPECTED OUTCOME:  MONITOR, SELF-MANAGE AND REDUCE SYMPTOMS OF DIABETES       Current Barriers:  Knowledge Deficits related to Diabetes management Chronic Disease Management support and education needs related to Diabetes and diet No Advanced Directives in place- patient requests information to be mailed Patient  reports CBG monitored by CGM Freestyle Libre and also fingerstick at times (mostly for comparison), states CBG range/average for all blood sugar readings for 7 days is 164, 14 days is 160,  30 days is 149, 90 days is 148, recent AIC 6.8 on 08/02/22, pt attributes to being on Ozempic.  Pt reports he does not follow a special diet, drinks occasional sugary soft drink.   Patient reports he has not eaten as healthy recently due to his spouse injured her knee but this is improving, spoke with spouse who reports she has not been cooking as much lately.  Planned Interventions: Reviewed medications with patient and discussed importance of medication adherence;        Reviewed prescribed diet with patient carbohydrate modified; Counseled on importance of regular laboratory monitoring as prescribed;        Provided patient with written educational materials related to hypo and hyperglycemia and importance of correct treatment;       Advised patient, providing education and rationale, to check cbg per CGM Freestyle [place]  and record        Review of patient status, including review of consultants reports, relevant laboratory and other test results, and medications completed;       Advised patient to discuss any issues with blood sugar with provider;       Symptom Management: Take medications as prescribed   Attend all scheduled provider appointments Call pharmacy for medication refills 3-7 days in advance of running out of medications Attend church or other social activities Perform all self care activities independently  Perform IADL's (shopping, preparing meals, housekeeping, managing finances) independently Call provider office for new concerns or questions  check blood sugar at prescribed times: Freestyle Libre  check feet daily for cuts, sores or redness enter blood sugar readings and medication or insulin into daily log take the blood sugar log to all doctor visits take the blood sugar meter to all doctor visits trim toenails straight across fill half of plate with vegetables limit fast food meals to no more than 1 per week manage portion size prepare main meal at home 3 to 5  days each week read food labels for fat, fiber, carbohydrates and portion size set a realistic goal wash and dry feet carefully every day wear comfortable, cotton socks Try to exercise some each day (walking is good) Avoid/ limit concentrated sweets  Follow Up Plan: Telephone follow up appointment with care management team member scheduled for:   02/26/23 at 130 pm          Patient verbalizes understanding of instructions and care plan provided today and agrees to view in MyChart. Active MyChart status and patient understanding of how to access instructions and care plan via MyChart confirmed with patient.  Telephone follow up appointment with care management team member scheduled for:  02/26/23 at 130 pm

## 2022-11-28 NOTE — Chronic Care Management (AMB) (Signed)
Chronic Care Management   CCM RN Visit Note  11/28/2022 Name: Connor Becker. MRN: [id number] DOB: [birth year]  Subjective: Connor Becker. is a 72 y.o. year old male who is a primary care patient of [name], MD. The patient was referred to the Chronic Care Management team for assistance with care management needs subsequent to provider initiation of CCM services and plan of care.    Today's Visit:  Engaged with patient by telephone for follow up visit.        Goals Addressed             This Visit's Progress    CCM (ATHEROSCLEROSIS OF AORTA) EXPECTED OUTCOME:  MONITOR, SELF-MANAGE AND REDUCE SYMPTOMS OF ATHEROSCLEROSIS OF AORTA       Current Barriers:  Knowledge Deficits related to Atherosclerosis of Aorta Chronic Disease Management support and education needs related to Atherosclerosis of Aorta, diet, exercise No Advanced Directives in place- information previously mailed Patient reports he lives with spouse, is independent with all aspects of his care, continues to drive, does not follow a special diet, does not have a formal exercise routine but does yard work and other work around the house and 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  Patient reports he has all medications and taking as prescribed  Planned Interventions: Medications reviewed including medications utilized in CAD treatment plan Provided education on importance of blood pressure control in management of CAD; Provided education on Importance of limiting foods high in cholesterol; Counseled on the importance of exercise goals with target of 150 minutes per week Reviewed Importance of taking all medications as prescribed Reviewed Importance of attending all scheduled provider appointments Reinforced heart healthy diet and nutritious food choices  Symptom Management: Take medications as prescribed   Attend all scheduled provider appointments Call pharmacy for medication refills 3-7 days  in advance of running out of medications Attend church or other social activities Perform all self care activities independently  Perform IADL's (shopping, preparing meals, housekeeping, managing finances) independently Call provider office for new concerns or questions  Try to do some type of exercise even if walking for a few minutes at a time Follow heart healthy diet- limit fast food Bake or broil foods instead of frying  Follow Up Plan: Telephone follow up appointment with care management team member scheduled for:   02/26/23 at 130 pm       CCM (DIABETES) EXPECTED OUTCOME:  MONITOR, SELF-MANAGE AND REDUCE SYMPTOMS OF DIABETES       Current Barriers:  Knowledge Deficits related to Diabetes management Chronic Disease Management support and education needs related to Diabetes and diet No Advanced Directives in place- patient requests information to be mailed Patient reports CBG monitored by CGM Freestyle Libre and also fingerstick at times (mostly for comparison), states CBG range/average for all blood sugar readings for 7 days is 164, 14 days is 160, 30 days is 149, 90 days is 148, recent AIC 6.8 on 08/02/22, pt attributes to being on Ozempic.  Pt reports he does not follow a special diet, drinks occasional sugary soft drink.   Patient reports he has not eaten as healthy recently due to his spouse injured her knee but this is improving, spoke with spouse who reports she has not been cooking as much lately.  Planned Interventions: Reviewed medications with patient and discussed importance of medication adherence;        Reviewed prescribed diet with patient carbohydrate modified; Counseled on importance of  regular laboratory monitoring as prescribed;        Provided patient with written educational materials related to hypo and hyperglycemia and importance of correct treatment;       Advised patient, providing education and rationale, to check cbg per CGM Freestyle [place]  and record         Review of patient status, including review of consultants reports, relevant laboratory and other test results, and medications completed;       Advised patient to discuss any issues with blood sugar with provider;       Symptom Management: Take medications as prescribed   Attend all scheduled provider appointments Call pharmacy for medication refills 3-7 days in advance of running out of medications Attend church or other social activities Perform all self care activities independently  Perform IADL's (shopping, preparing meals, housekeeping, managing finances) independently Call provider office for new concerns or questions  check blood sugar at prescribed times: Freestyle Libre  check feet daily for cuts, sores or redness enter blood sugar readings and medication or insulin into daily log take the blood sugar log to all doctor visits take the blood sugar meter to all doctor visits trim toenails straight across fill half of plate with vegetables limit fast food meals to no more than 1 per week manage portion size prepare main meal at home 3 to 5 days each week read food labels for fat, fiber, carbohydrates and portion size set a realistic goal wash and dry feet carefully every day wear comfortable, cotton socks Try to exercise some each day (walking is good) Avoid/ limit concentrated sweets  Follow Up Plan: Telephone follow up appointment with care management team member scheduled for:   02/26/23 at 130 pm          Plan:Telephone follow up appointment with care management team member scheduled for:  02/26/23 at 130 pm  Irving Shows [facility], BSN RN Case Manager [facility] [phone]

## 2022-12-11 ENCOUNTER — Encounter: Payer: Self-pay | Admitting: Internal Medicine

## 2022-12-14 DIAGNOSIS — E1165 Type 2 diabetes mellitus with hyperglycemia: Secondary | ICD-10-CM

## 2023-01-15 ENCOUNTER — Other Ambulatory Visit: Payer: Self-pay | Admitting: Pharmacist

## 2023-01-15 NOTE — Progress Notes (Signed)
Pharmacy Quality Measure Review  This patient is appearing on a report for being at risk of failing the adherence measure for diabetes medications this calendar year.   Medication: Ozempic 0.25/0.5 mg  Last fill date: 6/10 for 28 day supply; picked up on 6/11  Not quite due for refill yet. Previously sent and filled for a 84 day supply, this was for 28 day supply. Will outreach if not filled by next week to discuss any possible cost concerns.   Catie Eppie Gibson, PharmD, BCACP, CPP Clinical Pharmacist [facility] [phone]

## 2023-01-30 ENCOUNTER — Encounter: Payer: Self-pay | Admitting: Pharmacist

## 2023-01-30 DIAGNOSIS — G720 Drug-induced myopathy: Secondary | ICD-10-CM

## 2023-01-31 ENCOUNTER — Ambulatory Visit (INDEPENDENT_AMBULATORY_CARE_PROVIDER_SITE_OTHER): Payer: Medicare Other | Admitting: Internal Medicine

## 2023-01-31 ENCOUNTER — Other Ambulatory Visit: Payer: Self-pay

## 2023-01-31 VITALS — BP 130/84 | HR 68 | Temp 97.4°F | Ht 73.0 in | Wt 303.8 lb

## 2023-01-31 DIAGNOSIS — E039 Hypothyroidism, unspecified: Secondary | ICD-10-CM

## 2023-01-31 DIAGNOSIS — E1169 Type 2 diabetes mellitus with other specified complication: Secondary | ICD-10-CM | POA: Diagnosis not present

## 2023-01-31 DIAGNOSIS — E291 Testicular hypofunction: Secondary | ICD-10-CM

## 2023-01-31 DIAGNOSIS — I700 Atherosclerosis of aorta: Secondary | ICD-10-CM | POA: Diagnosis not present

## 2023-01-31 DIAGNOSIS — E785 Hyperlipidemia, unspecified: Secondary | ICD-10-CM | POA: Diagnosis not present

## 2023-01-31 DIAGNOSIS — Z6841 Body Mass Index (BMI) 40.0 and over, adult: Secondary | ICD-10-CM

## 2023-01-31 MED ORDER — LEVOTHYROXINE SODIUM 112 MCG PO TABS
112.0000 ug | ORAL_TABLET | Freq: Every day | ORAL | 2 refills | Status: DC
Start: 2023-01-31 — End: 2024-04-08

## 2023-01-31 MED ORDER — LISINOPRIL 5 MG PO TABS: 5.0000 mg | ORAL_TABLET | Freq: Every day | ORAL | 2 refills | Status: DC

## 2023-01-31 NOTE — Patient Instructions (Signed)

## 2023-01-31 NOTE — Assessment & Plan Note (Signed)
BMI 40. He is aware of 5lb weight gain since April 2024. He admits he drinks diet Gatorade and Cokes on occasion. He is advised that artificial sweeteners contribute to weight gain and can make it difficult to control his sugars with continued use.

## 2023-01-31 NOTE — Assessment & Plan Note (Signed)
Chronic, he admits he has not been compliant with testosterone injections. Referral sent to Urology in April 2024.

## 2023-01-31 NOTE — Progress Notes (Signed)
I,Jameka J Llittleton, CMA,acting as a Neurosurgeon for Gwynneth Aliment, MD.,have documented all relevant documentation on the behalf of Gwynneth Aliment, MD,as directed by  Gwynneth Aliment, MD while in the presence of Gwynneth Aliment, MD.  Subjective:  Patient ID: Connor Becker , male    DOB: 09-Apr-1951 , 72 y.o.   MRN: [id number]  Chief Complaint  Patient presents with   Diabetes   Hyperlipidemia    HPI  Patient presents today for a diabetes & chol check. Patient reports compliance with his medications. He denies having any specific questions or concerns. He has upcoming appt with Endo. He would like to have labs drawn today.      Diabetes He presents for his follow-up diabetic visit. He has type 2 diabetes mellitus. His disease course has been stable. There are no hypoglycemic associated symptoms. Pertinent negatives for diabetes include no polydipsia, no polyphagia and no polyuria. There are no hypoglycemic complications. Risk factors for coronary artery disease include diabetes mellitus, dyslipidemia, male sex, obesity and sedentary lifestyle. He participates in exercise intermittently. An ACE inhibitor/angiotensin II receptor blocker is being taken. Eye exam is not current.  Hyperlipidemia This is a chronic problem. The current episode started more than 1 year ago. The problem is controlled. Exacerbating diseases include diabetes, hypothyroidism and obesity. Pertinent negatives include no shortness of breath. Current antihyperlipidemic treatment includes statins. The current treatment provides moderate improvement of lipids.     Past Medical History:  Diagnosis Date   ALLERGIC RHINITIS    Chronic pain of both knees    DJD (degenerative joint disease) of knee    right   DM2 (diabetes mellitus, type 2) (HCC)    GERD    Glaucoma    History of nuclear stress test    Myoview 12/17: EF 53, no ischemia, Low Risk   HYPERLIPIDEMIA    myalgias from Lipitor   HYPOGONADISM, MALE     Hypothyroidism    INSOMNIA-SLEEP DISORDER-UNSPEC    Joint pain    Obesity    OSTEOARTHRITIS    SLEEP APNEA, OBSTRUCTIVE    no CPAP; repeat sleep test pending 07/2016   Unspecified hypothyroidism 12/04/2013   Vitamin D deficiency      Family History  Problem Relation Age of Onset   Heart attack Mother 65       s/p MI   Other Mother        pacemaker   Diabetes Mother    High blood pressure Mother    High Cholesterol Mother    Thyroid disease Mother    Obesity Mother    Macular degeneration Father    Thyroid disease Father    Diabetes Maternal Uncle    Heart attack Maternal Uncle    Hypertension Other    Prostate cancer Other    Heart disease Other    Colon cancer Neg Hx      Current Outpatient Medications:    albuterol (VENTOLIN HFA) 108 (90 Base) MCG/ACT inhaler, Inhale 2 puffs into the lungs every 6 (six) hours as needed for wheezing or shortness of breath., Disp: 8 g, Rfl: 2   azelastine (ASTELIN) 0.1 % nasal spray, Place 2 sprays into both nostrils 2 (two) times daily. Use in each nostril as directed, Disp: 30 mL, Rfl: 2   Continuous Blood Gluc Receiver (FREESTYLE LIBRE 2 READER) DEVI, 1 Device by Does not apply route as directed., Disp: 1 each, Rfl: 0   EPINEPHrine 0.3 mg/0.3 mL IJ SOAJ  injection, Inject 0.3 mg into the muscle as needed for anaphylaxis., Disp: 1 each, Rfl: 0   fluticasone (FLONASE) 50 MCG/ACT nasal spray, Place 2 sprays into both nostrils daily., Disp: 16 g, Rfl: 0   gabapentin (NEURONTIN) 300 MG capsule, Take 1 capsule (300 mg total) by mouth at bedtime., Disp: 90 capsule, Rfl: 3   latanoprost (XALATAN) 0.005 % ophthalmic solution, Place 1 drop into both eyes daily., Disp: , Rfl:    loperamide (IMODIUM) 2 MG capsule, Take 1 capsule (2 mg total) by mouth 4 (four) times daily as needed for diarrhea or loose stools., Disp: 12 capsule, Rfl: 0   omeprazole (PRILOSEC) 40 MG capsule, Take 1 capsule (40 mg total) by mouth daily., Disp: 60 capsule, Rfl: 5    ONETOUCH VERIO test strip, USE  STRIP TO CHECK GLUCOSE 4 TIMES DAILY, Disp: 200 each, Rfl: 0   promethazine (PHENERGAN) 12.5 MG tablet, Take 1 tablet (12.5 mg total) by mouth every 6 (six) hours as needed for nausea or vomiting., Disp: 6 tablet, Rfl: 0   pseudoephedrine (SUDAFED) 30 MG tablet, Take 60 mg by mouth 2 (two) times daily as needed for congestion., Disp: , Rfl:    tadalafil (CIALIS) 5 MG tablet, Take 5 mg by mouth daily., Disp: , Rfl: 11   timolol (TIMOPTIC) 0.5 % ophthalmic solution, Place 1 drop into both eyes daily., Disp: , Rfl: 2   Turmeric 500 MG CAPS, Take 500 mg by mouth daily., Disp: , Rfl:    vitamin C (ASCORBIC ACID) 500 MG tablet, Take 1,000 mg by mouth daily., Disp: , Rfl:    Vitamin D, Ergocalciferol, (DRISDOL) 1.25 MG (50000 UNIT) CAPS capsule, Take 1 capsule (50,000 Units total) by mouth See admin instructions. Take one capsule by mouth on Tues/Fridays, Disp: 24 capsule, Rfl: 0   Continuous Glucose Sensor (FREESTYLE LIBRE 3 SENSOR) MISC, 1 Device by Does not apply route every 14 (fourteen) days. Place 1 sensor on the skin every 14 days. Use to check glucose continuously, Disp: 6 each, Rfl: 3   Gabapentin 10 % CREA, Apply 1 Application topically at bedtime., Disp: 30 g, Rfl: 3   levocetirizine (XYZAL) 5 MG tablet, , Disp: , Rfl:    levothyroxine (SYNTHROID) 112 MCG tablet, Take 1 tablet (112 mcg total) by mouth daily., Disp: 90 tablet, Rfl: 2   lisinopril (ZESTRIL) 5 MG tablet, Take 1 tablet (5 mg total) by mouth daily., Disp: 90 tablet, Rfl: 2   montelukast (SINGULAIR) 10 MG tablet, Take 1 tablet (10 mg total) by mouth daily., Disp: 30 tablet, Rfl: 2   ondansetron (ZOFRAN ODT) 4 MG disintegrating tablet, Take 1 tablet (4 mg total) by mouth every 8 (eight) hours as needed for nausea or vomiting., Disp: 20 tablet, Rfl: 0   rosuvastatin (CRESTOR) 40 MG tablet, Take 1 tablet (40 mg total) by mouth daily., Disp: 90 tablet, Rfl: 3   Semaglutide, 1 MG/DOSE, 4 MG/3ML SOPN, Inject 1  mg as directed once a week., Disp: 9 mL, Rfl: 3   testosterone enanthate (DELATESTRYL) 200 MG/ML injection, Inject 200 mg into the muscle every 14 (fourteen) days., Disp: , Rfl:    Allergies  Allergen Reactions   Horseradish [Armoracia Rusticana Ext (Horseradish)]    Lipitor [Atorvastatin] Other (See Comments)    Leg cramps   Metformin And Related Other (See Comments)    GI upset     Review of Systems  Constitutional: Negative.   HENT: Negative.    Respiratory: Negative.  Negative for  shortness of breath.   Cardiovascular: Negative.   Gastrointestinal: Negative.   Endocrine: Negative.  Negative for polydipsia, polyphagia and polyuria.  Skin: Negative.   Allergic/Immunologic: Negative.   Neurological: Negative.   Hematological: Negative.      Today's Vitals   01/31/23 0910  BP: 130/84  Pulse: 68  Temp: (!) 97.4 F (36.3 C)  SpO2: 98%  Weight: (!) 303 lb 12.8 oz (137.8 kg)  Height: 6\' 1"  (1.854 m)   Body mass index is 40.08 kg/m.  Wt Readings from Last 3 Encounters:  02/07/23 (!) 304 lb (137.9 kg)  01/31/23 (!) 303 lb 12.8 oz (137.8 kg)  11/08/22 298 lb (135.2 kg)    The 10-year ASCVD risk score (Arnett DK, et al., 2019) is: 39.5%   Values used to calculate the score:     Age: 72 years     Sex: Male     Is Non-Hispanic African American: No     Diabetic: Yes     Tobacco smoker: No     Systolic Blood Pressure: 110 mmHg     Is BP treated: Yes     HDL Cholesterol: 40 mg/dL     Total Cholesterol: 205 mg/dL  Objective:  Physical Exam Vitals and nursing note reviewed.  Constitutional:      Appearance: Normal appearance. He is obese.  HENT:     Head: Normocephalic and atraumatic.     Nose: Nose normal.     Mouth/Throat:     Mouth: Mucous membranes are moist.     Pharynx: Oropharynx is clear.  Eyes:     Extraocular Movements: Extraocular movements intact.     Conjunctiva/sclera: Conjunctivae normal.     Pupils: Pupils are equal, round, and reactive to light.   Cardiovascular:     Rate and Rhythm: Normal rate and regular rhythm.     Pulses: Normal pulses.     Heart sounds: Normal heart sounds.  Pulmonary:     Effort: Pulmonary effort is normal.     Breath sounds: Normal breath sounds.  Abdominal:     General: Bowel sounds are normal.     Palpations: Abdomen is soft.     Comments: Obese, soft  Genitourinary:    Comments: deferred Musculoskeletal:        General: Normal range of motion.     Cervical back: Normal range of motion and neck supple.  Skin:    General: Skin is warm and dry.  Neurological:     General: No focal deficit present.     Mental Status: He is alert and oriented to person, place, and time.  Psychiatric:        Mood and Affect: Mood normal.        Behavior: Behavior normal.         Assessment And Plan:  Hyperlipidemia due to type 2 diabetes mellitus (HCC) Assessment & Plan: Chronic, LDL goal < 70.  He will c/w rosuvastatin 40mg  daily. He will c/w semaglutide weekly. He did not tolerate SGLT2i therapy. Importance of dietary/medication compliance was discussed with the patient. Endo input is appreciated.   Orders: -     Lipid panel -     Hemoglobin A1c -     AMB Referral to Chronic Care Management Services -     CMP14+EGFR  Atherosclerosis of aorta [facility]) Assessment & Plan: Chronic, LDL goal < 70.  He is on statin therapy. He is encouraged to follow a heart healthy lifestyle.   Orders: -  Lipid panel -     AMB Referral to Chronic Care Management Services -     CMP14+EGFR  Hypogonadism in male Assessment & Plan: Chronic, he admits he has not been compliant with testosterone injections. Referral sent to Urology in April 2024.   Orders: -     CMP14+EGFR -     Testosterone,Free and Total  Class 3 severe obesity due to excess calories with serious comorbidity and body mass index (BMI) of 40.0 to 44.9 in adult [facility]) Assessment & Plan: BMI 40. He is aware of 5lb weight gain since April 2024. He admits he  drinks diet Gatorade and Cokes on occasion. He is advised that artificial sweeteners contribute to weight gain and can make it difficult to control his sugars with continued use.    He is encouraged to strive for BMI less than 30 to decrease cardiac risk. Advised to aim for at least 150 minutes of exercise per week.   Return in about 3 months (around 05/03/2023).  Patient was given opportunity to ask questions. Patient verbalized understanding of the plan and was able to repeat key elements of the plan. All questions were answered to their satisfaction.    I, Gwynneth Aliment, MD, have reviewed all documentation for this visit. The documentation on 01/31/23 for the exam, diagnosis, procedures, and orders are all accurate and complete.   IF YOU HAVE BEEN REFERRED TO A SPECIALIST, IT MAY TAKE 1-2 WEEKS TO SCHEDULE/PROCESS THE REFERRAL. IF YOU HAVE NOT HEARD FROM US/SPECIALIST IN TWO WEEKS, PLEASE GIVE [place] A CALL AT [phone].

## 2023-02-02 LAB — LIPID PANEL
Chol/HDL Ratio: 5.1 ratio — ABNORMAL HIGH (ref 0.0–5.0)
Cholesterol, Total: 205 mg/dL — ABNORMAL HIGH (ref 100–199)
HDL: 40 mg/dL (ref >39)
LDL Chol Calc (NIH): 121 mg/dL — ABNORMAL HIGH (ref 0–99)
Triglycerides: 253 mg/dL — ABNORMAL HIGH (ref 0–149)
VLDL Cholesterol Cal: 44 mg/dL — ABNORMAL HIGH (ref 5–40)

## 2023-02-02 LAB — TESTOSTERONE,FREE AND TOTAL
Testosterone, Free: 1.6 pg/mL — ABNORMAL LOW (ref 6.6–18.1)
Testosterone: 200 ng/dL — ABNORMAL LOW (ref 264–916)

## 2023-02-02 LAB — CMP14+EGFR
ALT: 36 IU/L (ref 0–44)
AST: 22 IU/L (ref 0–40)
Albumin: 4.3 g/dL (ref 3.8–4.8)
Alkaline Phosphatase: 95 IU/L (ref 44–121)
BUN/Creatinine Ratio: 13 (ref 10–24)
BUN: 14 mg/dL (ref 8–27)
Bilirubin Total: 0.4 mg/dL (ref 0.0–1.2)
CO2: 25 mmol/L (ref 20–29)
Calcium: 9.3 mg/dL (ref 8.6–10.2)
Chloride: 96 mmol/L (ref 96–106)
Creatinine, Ser: 1.06 mg/dL (ref 0.76–1.27)
Globulin, Total: 3 g/dL (ref 1.5–4.5)
Glucose: 179 mg/dL — ABNORMAL HIGH (ref 70–99)
Potassium: 5.1 mmol/L (ref 3.5–5.2)
Sodium: 134 mmol/L (ref 134–144)
Total Protein: 7.3 g/dL (ref 6.0–8.5)
eGFR: 74 mL/min/{1.73_m2} (ref >59)

## 2023-02-02 LAB — HEMOGLOBIN A1C
Est. average glucose Bld gHb Est-mCnc: 186 mg/dL
Hgb A1c MFr Bld: 8.1 % — ABNORMAL HIGH (ref 4.8–5.6)

## 2023-02-05 ENCOUNTER — Other Ambulatory Visit: Payer: Medicare Other | Admitting: Pharmacist

## 2023-02-05 NOTE — Patient Instructions (Signed)
Renette Butters see were we are in the process for Ozempic patient assistance. My team will be in touch.   Thanks!  Catie Clearance Coots, PharmD

## 2023-02-05 NOTE — Progress Notes (Signed)
Care Coordination Call  Referral for patient assistance for Ozempic. Discussed with wife. She notes they had started the process previously with [facility], but is unsure where they stand with 2024 enrollment.   Will collaborate with CPhT team to apply for assistance. Patient's most recent A1c remains elevated, will order Ozempic 1 mg dose.   Catie Eppie Gibson, PharmD, BCACP, CPP Clinical Pharmacist [facility] [phone]

## 2023-02-07 ENCOUNTER — Ambulatory Visit: Payer: Medicare Other | Admitting: Internal Medicine

## 2023-02-07 ENCOUNTER — Encounter: Payer: Self-pay | Admitting: Internal Medicine

## 2023-02-07 VITALS — BP 110/68 | HR 74 | Ht 73.0 in | Wt 304.0 lb

## 2023-02-07 DIAGNOSIS — E785 Hyperlipidemia, unspecified: Secondary | ICD-10-CM | POA: Diagnosis not present

## 2023-02-07 DIAGNOSIS — E1165 Type 2 diabetes mellitus with hyperglycemia: Secondary | ICD-10-CM | POA: Diagnosis not present

## 2023-02-07 DIAGNOSIS — E1142 Type 2 diabetes mellitus with diabetic polyneuropathy: Secondary | ICD-10-CM | POA: Diagnosis not present

## 2023-02-07 DIAGNOSIS — Z7985 Long-term (current) use of injectable non-insulin antidiabetic drugs: Secondary | ICD-10-CM

## 2023-02-07 MED ORDER — FREESTYLE LIBRE 3 SENSOR MISC: 1.0000 | 3 refills | Status: DC

## 2023-02-07 MED ORDER — ROSUVASTATIN CALCIUM 40 MG PO TABS: 40.0000 mg | ORAL_TABLET | Freq: Every day | ORAL | 3 refills | Status: DC

## 2023-02-07 MED ORDER — SEMAGLUTIDE (1 MG/DOSE) 4 MG/3ML ~~LOC~~ SOPN: 1.0000 mg | PEN_INJECTOR | SUBCUTANEOUS | 3 refills | Status: DC

## 2023-02-07 NOTE — Progress Notes (Signed)
.   Name: Connor Becker.  Age/ Sex: 72 y.o., male   MRN/ DOB: [id number], 11/10/1950     PCP: [name], MD   Reason for Endocrinology Evaluation: Type 2 Diabetes Mellitus  Initial Endocrine Consultative Visit: 04/13/2020    PATIENT IDENTIFIER: [name] is a 72 y.o. male with a past medical history of T2DM,OSA, hypogonadism and dyslipidemia. The patient has followed with Endocrinology clinic since 04/13/2020 for consultative assistance with management of his diabetes.     DIABETIC HISTORY:  Connor Becker was diagnosed with DM2 in 2014,Metformin- GI side effect, Farxiga -severe urinary frequency preventing him from sleeping at night.  His hemoglobin A1c has ranged from 6.6%  in 2020, peaking at 7.7% in 2021.  On his initial visit to our clinic his A1c was 7.7%  ,  He was not on any glycemic medications. Started Rybelsus  Rybelsus stopped by 06/2021 due to nausea and vomiting and started pioglitazone but he did not start it  By 10/2021 he started using his wife's Ozempic and tolerated well  He self discontinued glipizide by August 2023-no reported intolerance just due to fear of hypoglycemia   SUBJECTIVE:   During the last visit (08/06/2022): A1c 6.8%     Today (02/07/2023): Connor Becker is here for a follow up on diabetes.  He checks his blood sugars multiple times a day through CGM. The patient has not had hypoglycemic episodes since the last clinic visit.  Denies nausea or vomiting  Denies constipation or diarrhea  He has a pending referral to urology for hypogonadism, topical testosterone has not been helping him   HOME DIABETES REGIMEN:  Ozempic 0.5 mg weekly  Rosuvastatin 20 mg daily    Statin: yes ACE-I/ARB: yes   CONTINUOUS GLUCOSE MONITORING RECORD INTERPRETATION    Dates of Recording: 7/12-7/25/2024  Sensor description:freestyle libre   Results statistics:   CGM use % of time 92  Average and SD 184/18.6  Time in range 50%  %  Time Above 180 46  % Time above 250 4  % Time Below target 0      Glycemic patterns summary: BG's are high overnight and fluctuate during the day Hyperglycemic episodes  Post prandial and overnight  Hypoglycemic episodes occurred N.A  Overnight periods: High     DIABETIC COMPLICATIONS: Microvascular complications:   Denies: CKD, retinopathy, neuropathy Last Eye Exam: Completed 10/24/2022  Macrovascular complications:   Denies: CAD, CVA, PVD   HISTORY:  Past Medical History:  Past Medical History:  Diagnosis Date   ALLERGIC RHINITIS    Chronic pain of both knees    DJD (degenerative joint disease) of knee    right   DM2 (diabetes mellitus, type 2) (HCC)    GERD    Glaucoma    History of nuclear stress test    Myoview 12/17: EF 53, no ischemia, Low Risk   HYPERLIPIDEMIA    myalgias from Lipitor   HYPOGONADISM, MALE    Hypothyroidism    INSOMNIA-SLEEP DISORDER-UNSPEC    Joint pain    Obesity    OSTEOARTHRITIS    SLEEP APNEA, OBSTRUCTIVE    no CPAP; repeat sleep test pending 07/2016   Unspecified hypothyroidism 12/04/2013   Vitamin D deficiency    Past Surgical History:  Past Surgical History:  Procedure Laterality Date   NASAL SEPTUM SURGERY     TONSILLECTOMY     UVULOPALATOPHARYNGOPLASTY     VASECTOMY     Social History:  reports that  he has never smoked. He has never used smokeless tobacco. He reports that he does not currently use alcohol. He reports that he does not use drugs. Family History:  Family History  Problem Relation Age of Onset   Heart attack Mother 2       s/p MI   Other Mother        pacemaker   Diabetes Mother    High blood pressure Mother    High Cholesterol Mother    Thyroid disease Mother    Obesity Mother    Macular degeneration Father    Thyroid disease Father    Diabetes Maternal Uncle    Heart attack Maternal Uncle    Hypertension Other    Prostate cancer Other    Heart disease Other    Colon cancer Neg Hx       HOME MEDICATIONS: Allergies as of 02/07/2023       Reactions   Horseradish [armoracia [place] Ext (horseradish)]    Lipitor [atorvastatin] Other (See Comments)   Leg cramps   Metformin And Related Other (See Comments)   GI upset        Medication List        Accurate as of February 07, 2023  9:42 AM. If you have any questions, ask your nurse or doctor.          albuterol 108 (90 Base) MCG/ACT inhaler Commonly known as: VENTOLIN HFA Inhale 2 puffs into the lungs every 6 (six) hours as needed for wheezing or shortness of breath.   ascorbic acid 500 MG tablet Commonly known as: VITAMIN C Take 1,000 mg by mouth daily.   azelastine 0.1 % nasal spray Commonly known as: ASTELIN Place 2 sprays into both nostrils 2 (two) times daily. Use in each nostril as directed   EPINEPHrine 0.3 mg/0.3 mL Soaj injection Commonly known as: EPI-PEN Inject 0.3 mg into the muscle as needed for anaphylaxis.   fluticasone 50 MCG/ACT nasal spray Commonly known as: FLONASE Place 2 sprays into both nostrils daily.   FreeStyle Libre 2 Reader Devi 1 Device by Does not apply route as directed.   FreeStyle Libre 2 Sensor Misc 1 Device by Other route every 14 (fourteen) days.   Gabapentin 10 % Crea Apply 1 Application topically at bedtime.   gabapentin 300 MG capsule Commonly known as: NEURONTIN Take 1 capsule (300 mg total) by mouth at bedtime.   latanoprost 0.005 % ophthalmic solution Commonly known as: XALATAN Place 1 drop into both eyes daily.   levocetirizine 5 MG tablet Commonly known as: XYZAL   levothyroxine 112 MCG tablet Commonly known as: SYNTHROID Take 1 tablet (112 mcg total) by mouth daily.   lisinopril 5 MG tablet Commonly known as: ZESTRIL Take 1 tablet (5 mg total) by mouth daily.   loperamide 2 MG capsule Commonly known as: IMODIUM Take 1 capsule (2 mg total) by mouth 4 (four) times daily as needed for diarrhea or loose stools.   montelukast 10 MG  tablet Commonly known as: SINGULAIR Take 1 tablet (10 mg total) by mouth daily.   omeprazole 40 MG capsule Commonly known as: PRILOSEC Take 1 capsule (40 mg total) by mouth daily.   ondansetron 4 MG disintegrating tablet Commonly known as: Zofran ODT Take 1 tablet (4 mg total) by mouth every 8 (eight) hours as needed for nausea or vomiting.   OneTouch Verio test strip Generic drug: glucose blood USE  STRIP TO CHECK GLUCOSE 4 TIMES DAILY   promethazine 12.5  MG tablet Commonly known as: PHENERGAN Take 1 tablet (12.5 mg total) by mouth every 6 (six) hours as needed for nausea or vomiting.   pseudoephedrine 30 MG tablet Commonly known as: SUDAFED Take 60 mg by mouth 2 (two) times daily as needed for congestion.   rosuvastatin 20 MG tablet Commonly known as: Crestor Take 1 tablet (20 mg total) by mouth daily.   Semaglutide(0.25 or 0.5MG /DOS) 2 MG/3ML Sopn Inject 0.5 mg into the skin once a week.   tadalafil 5 MG tablet Commonly known as: CIALIS Take 5 mg by mouth daily.   testosterone enanthate 200 MG/ML injection Commonly known as: DELATESTRYL Inject 200 mg into the muscle every 14 (fourteen) days.   timolol 0.5 % ophthalmic solution Commonly known as: TIMOPTIC Place 1 drop into both eyes daily.   Turmeric 500 MG Caps Take 500 mg by mouth daily.   Vitamin D (Ergocalciferol) 1.25 MG (50000 UNIT) Caps capsule Commonly known as: DRISDOL Take 1 capsule (50,000 Units total) by mouth See admin instructions. Take one capsule by mouth on Tues/Fridays         OBJECTIVE:   Vital Signs: BP 110/68 (BP Location: Left Arm, Patient Position: Sitting, Cuff Size: Large)   Pulse 74   Ht 6\' 1"  (1.854 m)   Wt (!) 304 lb (137.9 kg)   SpO2 98%   BMI 40.11 kg/m   Wt Readings from Last 3 Encounters:  02/07/23 (!) 304 lb (137.9 kg)  01/31/23 (!) 303 lb 12.8 oz (137.8 kg)  11/08/22 298 lb (135.2 kg)     Exam: General: Pt appears well and is in NAD  Lungs: Clear with good  BS bilat   Heart: RRR   Extremities: Trace pretibial edema.   Neuro: MS is good with appropriate affect, pt is alert and Ox3   DM foot exam:03/05/2022   The skin of the feet is intact without sores or ulcerations. The pedal pulses are 1+ on right and 1+ on left. The sensation is decreased to a screening 5.07, 10 gram monofilament bilaterally     DATA REVIEWED:  Lab Results  Component Value Date   HGBA1C 8.1 (H) 01/31/2023   HGBA1C 6.9 (H) 11/08/2022   HGBA1C 6.8 (H) 08/02/2022    Latest Reference Range & Units 01/31/23 09:48  Sodium 134 - 144 mmol/L 134  Potassium 3.5 - 5.2 mmol/L 5.1  Chloride 96 - 106 mmol/L 96  CO2 20 - 29 mmol/L 25  Glucose 70 - 99 mg/dL 161 (H)  BUN 8 - 27 mg/dL 14  Creatinine 0.96 - 0.45 mg/dL 4.09  Calcium 8.6 - 81.1 mg/dL 9.3  BUN/Creatinine Ratio 10 - 24  13  eGFR >59 mL/min/1.73 74  Alkaline Phosphatase 44 - 121 IU/L 95  Albumin 3.8 - 4.8 g/dL 4.3  AST 0 - 40 IU/L 22  ALT 0 - 44 IU/L 36  Total Protein 6.0 - 8.5 g/dL 7.3  Total Bilirubin 0.0 - 1.2 mg/dL 0.4  Total CHOL/HDL Ratio 0.0 - 5.0 ratio 5.1 (H)  Cholesterol, Total 100 - 199 mg/dL 914 (H)  HDL Cholesterol >39 mg/dL 40  Triglycerides 0 - 782 mg/dL 956 (H)  VLDL Cholesterol Cal 5 - 40 mg/dL 44 (H)  LDL Chol Calc (NIH) 0 - 99 mg/dL 213 (H)    Old records , labs and images have been reviewed.    ASSESSMENT / PLAN / RECOMMENDATIONS:   1) Type 2 Diabetes Mellitus, Poorly  controlled, With neuropathic complications - Most recent A1c of  8.1 % Goal A1c < 7.0 %.    -His A1c has increased from 6.9% to 8.1%, patient admits to dietary indiscretion over the past 6 months -In the past he endorsed intolerance to Rybelsus -He is intolerant to metformin -He is intolerant to [place] due to frequency impeding his restful sleep at night -I have suggested increasing Ozempic as below, patient reluctantly agreed, he wanted to work on his lifestyle changes again, I did explain to the patient that he  may continue to work on lifestyle changes in addition to increasing his Ozempic as I did not want his A1c to continue to increase   MEDICATIONS:  -Increase Ozempic 1 mg weekly     EDUCATION / INSTRUCTIONS: BG monitoring instructions: Patient is instructed to check his blood sugars 2 times a day, fasting and bedtime . Call [facility] if: BG persistently < 70  I reviewed the Rule of 15 for the treatment of hypoglycemia in detail with the patient. Literature supplied.    2) Diabetic complications:  Eye: Does not have known diabetic retinopathy.  Neuro/ Feet: Does not have known diabetic peripheral neuropathy .  Renal: Patient does not have known baseline CKD.      3) Dyslipidemia :   -LDL increased from 79 to 102 mg/DL and as of his most recent lab results LDL at 121 mg/DL -We again discussed low fat diet -I have recommended increasing rosuvastatin as below  medication Increase rosuvastatin 40 mg daily        F/U in 6 months     Signed electronically by: Lyndle Herrlich, MD  [facility]  [facility] [address] [place] [place], [place] [address] Phone: [phone] FAX: [phone]   CC: [name], MD [address] [place] [address] Phone: [phone]  Fax: [phone]  Return to Endocrinology clinic as below: Future Appointments  Date Time Provider Department Center  02/26/2023  1:30 PM [facility] CASE MANAGER [facility] None  05/08/2023 10:00 AM [name], MD [facility] None  08/15/2023 10:00 AM [name], MD [facility] None

## 2023-02-07 NOTE — Patient Instructions (Addendum)
-   Increase Ozempic 1 mg weekly  - Increase Rosuvastatin 40 mg daily     HOW TO TREAT LOW BLOOD SUGARS (Blood sugar LESS THAN 70 MG/DL) Please follow the RULE OF 15 for the treatment of hypoglycemia treatment (when your (blood sugars are less than 70 mg/dL)   STEP 1: Take 15 grams of carbohydrates when your blood sugar is low, which includes:  3-4 GLUCOSE TABS  OR 3-4 OZ OF JUICE OR REGULAR SODA OR ONE TUBE OF GLUCOSE GEL    STEP 2: RECHECK blood sugar in 15 MINUTES STEP 3: If your blood sugar is still low at the 15 minute recheck --> then, go back to STEP 1 and treat AGAIN with another 15 grams of carbohydrates.

## 2023-02-09 NOTE — Assessment & Plan Note (Signed)
Chronic, LDL goal < 70.  He is on statin therapy. He is encouraged to follow a heart healthy lifestyle.

## 2023-02-09 NOTE — Assessment & Plan Note (Signed)
Chronic, LDL goal < 70.  He will c/w rosuvastatin 40mg  daily. He will c/w semaglutide weekly. He did not tolerate SGLT2i therapy. Importance of dietary/medication compliance was discussed with the patient. Endo input is appreciated.

## 2023-02-11 ENCOUNTER — Telehealth: Payer: Self-pay

## 2023-02-11 ENCOUNTER — Other Ambulatory Visit (HOSPITAL_COMMUNITY): Payer: Self-pay

## 2023-02-11 NOTE — Telephone Encounter (Signed)
Submitted application for OZEMPIC to [employer] for patient assistance via online portal.   Phone: [phone]  

## 2023-02-11 NOTE — Telephone Encounter (Signed)
-----   Message from [employer] sent at 02/05/2023  1:49 PM EDT ----- Hi team - [place] previously applied for Ozempic assistance, could someone check with [employer] and see if he is enrolled? If not, please start [employer] application online if able for Ozempic 1 mg. Thanks!

## 2023-02-20 NOTE — Telephone Encounter (Signed)
Received notification from [employer] regarding approval for OZEMPIC. Patient assistance approved from 02/14/23 to 07/16/23.  Medication will ship to office. Should arrive in 10-14 business days.  Phone: [phone]

## 2023-02-22 ENCOUNTER — Other Ambulatory Visit: Payer: Self-pay

## 2023-02-22 DIAGNOSIS — E119 Type 2 diabetes mellitus without complications: Secondary | ICD-10-CM | POA: Diagnosis not present

## 2023-02-22 DIAGNOSIS — H353132 Nonexudative age-related macular degeneration, bilateral, intermediate dry stage: Secondary | ICD-10-CM | POA: Diagnosis not present

## 2023-02-22 DIAGNOSIS — H2513 Age-related nuclear cataract, bilateral: Secondary | ICD-10-CM | POA: Diagnosis not present

## 2023-02-22 DIAGNOSIS — E1169 Type 2 diabetes mellitus with other specified complication: Secondary | ICD-10-CM

## 2023-02-22 DIAGNOSIS — H401133 Primary open-angle glaucoma, bilateral, severe stage: Secondary | ICD-10-CM | POA: Diagnosis not present

## 2023-02-22 MED ORDER — EZETIMIBE 10 MG PO TABS
10.0000 mg | ORAL_TABLET | Freq: Every day | ORAL | 1 refills | Status: DC
Start: 2023-02-22 — End: 2023-08-15

## 2023-02-26 ENCOUNTER — Other Ambulatory Visit: Payer: Medicare Other | Admitting: *Deleted

## 2023-02-26 ENCOUNTER — Telehealth: Payer: Medicare Other

## 2023-02-26 ENCOUNTER — Encounter: Payer: Self-pay | Admitting: *Deleted

## 2023-02-26 NOTE — Patient Instructions (Signed)
Visit Information  Thank you for taking time to visit with me today. Please don't hesitate to contact me if I can be of assistance to you before our next scheduled telephone appointment.  Following are the goals we discussed today:   Goals Addressed             This Visit's Progress    CCM (ATHEROSCLEROSIS OF AORTA) EXPECTED OUTCOME:  MONITOR, SELF-MANAGE AND REDUCE SYMPTOMS OF ATHEROSCLEROSIS OF AORTA       Current Barriers:  Knowledge Deficits related to Atherosclerosis of Aorta Chronic Disease Management support and education needs related to Atherosclerosis of Aorta, diet, exercise No Advanced Directives in place- information previously mailed Patient reports he lives with spouse, is independent with all aspects of his care, continues to drive, does not follow a special diet, does not have a formal exercise routine but does yard work and other work around the house and 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  Patient reports he has all medications and taking as prescribed, no new concerns reported  Planned Interventions: Medications reviewed including medications utilized in CAD treatment plan Provided education on importance of blood pressure control in management of CAD; Provided education on Importance of limiting foods high in cholesterol; Counseled on the importance of exercise goals with target of 150 minutes per week Reviewed Importance of taking all medications as prescribed Reviewed Importance of attending all scheduled provider appointments Reviewed heart healthy diet and nutritious food choices  Symptom Management: Take medications as prescribed   Attend all scheduled provider appointments Call pharmacy for medication refills 3-7 days in advance of running out of medications Attend church or other social activities Perform all self care activities independently  Perform IADL's (shopping, preparing meals, housekeeping, managing finances) independently Call  provider office for new concerns or questions  Try to do some type of exercise even if walking for a few minutes at a time Follow heart healthy diet- limit fast food Bake or broil foods instead of frying  Follow Up Plan: Telephone follow up appointment with care management team member scheduled for:   05/15/23 at 215 pm       CCM (DIABETES) EXPECTED OUTCOME:  MONITOR, SELF-MANAGE AND REDUCE SYMPTOMS OF DIABETES       Current Barriers:  Knowledge Deficits related to Diabetes management Chronic Disease Management support and education needs related to Diabetes and diet No Advanced Directives in place- documents previously mailed Patient reports CBG monitored by CGM Freestyle Libre 2 and also fingerstick at times (mostly for comparison), states CBG range/average for all blood sugar readings for 7 days is 188, 30 days is 186,  recent AIC 8.1 on 01/31/23.  Pt reports blood sugar has been slightly elevated and not sure the reason, is eating a later dinner around 930 pm due to his schedule and hopes this will be changing in the near future, also has not been exercising as much due to the heat, drinks occasional sugary soft drink.    Planned Interventions: Reviewed medications with patient and discussed importance of medication adherence;        Counseled on importance of regular laboratory monitoring as prescribed;        Advised patient, providing education and rationale, to check cbg per CGM Freestyle [place]  and record        Review of patient status, including review of consultants reports, relevant laboratory and other test results, and medications completed;       Advised patient to discuss  any issues with blood sugar with provider;      Reinforced carbohydrate modified diet  Symptom Management: Take medications as prescribed   Attend all scheduled provider appointments Call pharmacy for medication refills 3-7 days in advance of running out of medications Attend church or other social  activities Perform all self care activities independently  Perform IADL's (shopping, preparing meals, housekeeping, managing finances) independently Call provider office for new concerns or questions  check blood sugar at prescribed times: Freestyle Libre  check feet daily for cuts, sores or redness enter blood sugar readings and medication or insulin into daily log take the blood sugar log to all doctor visits take the blood sugar meter to all doctor visits trim toenails straight across fill half of plate with vegetables limit fast food meals to no more than 1 per week manage portion size prepare main meal at home 3 to 5 days each week read food labels for fat, fiber, carbohydrates and portion size set a realistic goal wash and dry feet carefully every day wear comfortable, cotton socks Try to exercise some each day (walking is good) Avoid/ limit concentrated sweets Keep regular meal times for breakfast, lunch and dinner  Follow Up Plan: Telephone follow up appointment with care management team member scheduled for:   05/15/23 at 215 pm           Our next appointment is by telephone on 05/15/23 at 215 pm   Please call the care guide team at [phone] if you need to cancel or reschedule your appointment.   If you are experiencing a Mental Health or Behavioral Health Crisis or need someone to talk to, please call the Suicide and Crisis Lifeline: 988 call the [place] National Suicide Prevention Lifeline: 502-803-4102 or TTY: 769-798-5263 TTY 580-079-7825) to talk to a trained counselor call 1-800-273-TALK (toll free, 24 hour hotline) go to [facility] [address] [place] [phone]) call 911   Patient verbalizes understanding of instructions and care plan provided today and agrees to view in MyChart. Active MyChart status and patient understanding of how to access instructions and care plan via MyChart confirmed with patient.      Telephone follow up appointment with care management team member scheduled for: 05/15/23 at 215 pm  Irving Shows [facility], BSN [place]/ [facility] Management [phone]

## 2023-02-26 NOTE — Patient Outreach (Signed)
Care Management   Visit Note  02/26/2023 Name: Connor Becker. MRN: [id number] DOB: 1951-01-01  Subjective: Connor Becker. is a 72 y.o. year old male who is a primary care patient of [name], MD. The Care Management team was consulted for assistance.      Engaged with patient spoke with patient by telephone for follow up.   Goals Addressed             This Visit's Progress    CCM (ATHEROSCLEROSIS OF AORTA) EXPECTED OUTCOME:  MONITOR, SELF-MANAGE AND REDUCE SYMPTOMS OF ATHEROSCLEROSIS OF AORTA       Current Barriers:  Knowledge Deficits related to Atherosclerosis of Aorta Chronic Disease Management support and education needs related to Atherosclerosis of Aorta, diet, exercise No Advanced Directives in place- information previously mailed Patient reports he lives with spouse, is independent with all aspects of his care, continues to drive, does not follow a special diet, does not have a formal exercise routine but does yard work and other work around the house and 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  Patient reports he has all medications and taking as prescribed, no new concerns reported  Planned Interventions: Medications reviewed including medications utilized in CAD treatment plan Provided education on importance of blood pressure control in management of CAD; Provided education on Importance of limiting foods high in cholesterol; Counseled on the importance of exercise goals with target of 150 minutes per week Reviewed Importance of taking all medications as prescribed Reviewed Importance of attending all scheduled provider appointments Reviewed heart healthy diet and nutritious food choices  Symptom Management: Take medications as prescribed   Attend all scheduled provider appointments Call pharmacy for medication refills 3-7 days in advance of running out of medications Attend church or other social activities Perform all self care  activities independently  Perform IADL's (shopping, preparing meals, housekeeping, managing finances) independently Call provider office for new concerns or questions  Try to do some type of exercise even if walking for a few minutes at a time Follow heart healthy diet- limit fast food Bake or broil foods instead of frying  Follow Up Plan: Telephone follow up appointment with care management team member scheduled for:   05/15/23 at 215 pm       CCM (DIABETES) EXPECTED OUTCOME:  MONITOR, SELF-MANAGE AND REDUCE SYMPTOMS OF DIABETES       Current Barriers:  Knowledge Deficits related to Diabetes management Chronic Disease Management support and education needs related to Diabetes and diet No Advanced Directives in place- documents previously mailed Patient reports CBG monitored by CGM Freestyle Libre 2 and also fingerstick at times (mostly for comparison), states CBG range/average for all blood sugar readings for 7 days is 188, 30 days is 186,  recent AIC 8.1 on 01/31/23.  Pt reports blood sugar has been slightly elevated and not sure the reason, is eating a later dinner around 930 pm due to his schedule and hopes this will be changing in the near future, also has not been exercising as much due to the heat, drinks occasional sugary soft drink.    Planned Interventions: Reviewed medications with patient and discussed importance of medication adherence;        Counseled on importance of regular laboratory monitoring as prescribed;        Advised patient, providing education and rationale, to check cbg per CGM Freestyle [place]  and record        Review of patient status, including  review of consultants reports, relevant laboratory and other test results, and medications completed;       Advised patient to discuss any issues with blood sugar with provider;      Reinforced carbohydrate modified diet  Symptom Management: Take medications as prescribed   Attend all scheduled provider  appointments Call pharmacy for medication refills 3-7 days in advance of running out of medications Attend church or other social activities Perform all self care activities independently  Perform IADL's (shopping, preparing meals, housekeeping, managing finances) independently Call provider office for new concerns or questions  check blood sugar at prescribed times: Freestyle Libre  check feet daily for cuts, sores or redness enter blood sugar readings and medication or insulin into daily log take the blood sugar log to all doctor visits take the blood sugar meter to all doctor visits trim toenails straight across fill half of plate with vegetables limit fast food meals to no more than 1 per week manage portion size prepare main meal at home 3 to 5 days each week read food labels for fat, fiber, carbohydrates and portion size set a realistic goal wash and dry feet carefully every day wear comfortable, cotton socks Try to exercise some each day (walking is good) Avoid/ limit concentrated sweets Keep regular meal times for breakfast, lunch and dinner  Follow Up Plan: Telephone follow up appointment with care management team member scheduled for:   05/15/23 at 215 pm           Plan: Telephone follow up appointment with care management team member scheduled for: 05/15/23 at 215 pm  [facility], BSN [place]/ Ambulatory Care Management [phone]

## 2023-03-01 ENCOUNTER — Telehealth: Payer: Self-pay

## 2023-03-01 NOTE — Telephone Encounter (Signed)
Patient was called to inform that patient assistance is ready for pick up.

## 2023-03-14 ENCOUNTER — Telehealth: Payer: Self-pay | Admitting: Internal Medicine

## 2023-03-14 ENCOUNTER — Telehealth: Payer: Medicare Other | Admitting: Family Medicine

## 2023-03-14 DIAGNOSIS — H6690 Otitis media, unspecified, unspecified ear: Secondary | ICD-10-CM | POA: Diagnosis not present

## 2023-03-14 NOTE — Telephone Encounter (Signed)
OZEMPIC 1X3ML QUANTITY 4 PICK UP 03/05/23

## 2023-03-15 MED ORDER — AMOXICILLIN-POT CLAVULANATE 875-125 MG PO TABS: 1.0000 | ORAL_TABLET | Freq: Two times a day (BID) | ORAL | 0 refills | Status: DC

## 2023-03-15 MED ORDER — CIPROFLOXACIN-HYDROCORTISONE 0.2-1 % OT SUSP: 3.0000 [drp] | Freq: Two times a day (BID) | OTIC | 0 refills | Status: AC

## 2023-03-15 NOTE — Progress Notes (Signed)
E-Visit for Ear Pain - Acute Otitis Media   We are sorry that you are not feeling well. Here is how we plan to help!  Based on what you have shared with me it looks like you have Acute Otitis Media.  Acute Otitis Media is an infection of the middle or "inner" ear. This type of infection can cause redness, inflammation, and fluid buildup behind the tympanic membrane (ear drum).  The usual symptoms include: Earache/Pain Fever Upper respiratory symptoms Lack of energy/Fatigue/Malaise Slight hearing loss gradually worsening- if the inner ear fills with fluid What causes middle ear infections? Most middle ear infections occur when an infection such as a cold, leads to a build-up of mucus in the middle ear and causes the Eustachian tube (a thin tube that runs from the middle ear to the back of the nose) to become swollen or blocked.   This means mucus can't drain away properly, making it easier for an infection to spread into the middle ear.  How middle ear infections are treated: Most ear infections clear up within three to five days and don't need any specific treatment. If necessary, tylenol or ibuprofen should be used to relieve pain and a high temperature.  If you develop a fever higher than 102, or any significantly worsening symptoms, this could indicate a more serious infection moving to the middle/inner and needs face to face evaluation in an office by a provider.   Antibiotics aren't routinely used to treat middle ear infections, although they may occasionally be prescribed if symptoms persist or are particularly severe. Given your presentation,   I have prescribed Augmentin 875-125 mg one tablet by mouth twice a day for 10 days  I have prescribed: Ciprofloxin 0.2% and hydrocortisone 1% otic suspension 3 drops in affected ears twice daily for 7 days   Your symptoms should improve over the next 3 days and should resolve in about 7 days. Be sure to complete ALL of the prescription(s)  given.  HOME CARE: Wash your hands frequently. If you are prescribed an ear drop, do not place the tip of the bottle on your ear or touch it with your fingers. You can take Acetaminophen 650 mg every 4-6 hours as needed for pain.  If pain is severe or moderate, you can apply a heating pad (set on low) or hot water bottle (wrapped in a towel) to outer ear for 20 minutes.  This will also increase drainage.  GET HELP RIGHT AWAY IF: Fever is over 102.2 degrees. You develop progressive ear pain or hearing loss. Ear symptoms persist longer than 3 days after treatment.  MAKE SURE YOU: Understand these instructions. Will watch your condition. Will get help right away if you are not doing well or get worse.  Thank you for choosing an e-visit.  Your e-visit answers were reviewed by a board certified advanced clinical practitioner to complete your personal care plan. Depending upon the condition, your plan could have included both over the counter or prescription medications.  Please review your pharmacy choice. Make sure the pharmacy is open so you can pick up the prescription now. If there is a problem, you may contact your provider through [employer] and have the prescription routed to another pharmacy.  Your safety is important to [place]. If you have drug allergies check your prescription carefully.   For the next 24 hours you can use MyChart to ask questions about today's visit, request a non-urgent call back, or ask for a work or  school excuse. You will get an email with a survey after your eVisit asking about your experience. We would appreciate your feedback. I hope that your e-visit has been valuable and will aid in your recovery.     have provided 5 minutes of non face to face time during this encounter for chart review and documentation.

## 2023-03-25 DIAGNOSIS — R350 Frequency of micturition: Secondary | ICD-10-CM | POA: Diagnosis not present

## 2023-04-08 DIAGNOSIS — H2512 Age-related nuclear cataract, left eye: Secondary | ICD-10-CM | POA: Diagnosis not present

## 2023-04-23 DIAGNOSIS — H401122 Primary open-angle glaucoma, left eye, moderate stage: Secondary | ICD-10-CM | POA: Diagnosis not present

## 2023-04-23 DIAGNOSIS — H2512 Age-related nuclear cataract, left eye: Secondary | ICD-10-CM | POA: Diagnosis not present

## 2023-04-23 DIAGNOSIS — H401123 Primary open-angle glaucoma, left eye, severe stage: Secondary | ICD-10-CM | POA: Diagnosis not present

## 2023-05-02 DIAGNOSIS — H2512 Age-related nuclear cataract, left eye: Secondary | ICD-10-CM | POA: Diagnosis not present

## 2023-05-07 DIAGNOSIS — H2511 Age-related nuclear cataract, right eye: Secondary | ICD-10-CM | POA: Diagnosis not present

## 2023-05-07 DIAGNOSIS — H401113 Primary open-angle glaucoma, right eye, severe stage: Secondary | ICD-10-CM | POA: Diagnosis not present

## 2023-05-07 NOTE — Progress Notes (Unsigned)
I,Victoria T Deloria Lair, CMA,acting as a Neurosurgeon for Gwynneth Aliment, MD.,have documented all relevant documentation on the behalf of Gwynneth Aliment, MD,as directed by  Gwynneth Aliment, MD while in the presence of Gwynneth Aliment, MD.  Subjective:  Patient ID: Connor Becker , male    DOB: 06/02/1951 , 72 y.o.   MRN: [id number]  No chief complaint on file.   HPI  HPI   Past Medical History:  Diagnosis Date   ALLERGIC RHINITIS    Chronic pain of both knees    DJD (degenerative joint disease) of knee    right   DM2 (diabetes mellitus, type 2) (HCC)    GERD    Glaucoma    History of nuclear stress test    Myoview 12/17: EF 53, no ischemia, Low Risk   HYPERLIPIDEMIA    myalgias from Lipitor   HYPOGONADISM, MALE    Hypothyroidism    INSOMNIA-SLEEP DISORDER-UNSPEC    Joint pain    Obesity    OSTEOARTHRITIS    SLEEP APNEA, OBSTRUCTIVE    no CPAP; repeat sleep test pending 07/2016   Unspecified hypothyroidism 12/04/2013   Vitamin D deficiency      Family History  Problem Relation Age of Onset   Heart attack Mother 21       s/p MI   Other Mother        pacemaker   Diabetes Mother    High blood pressure Mother    High Cholesterol Mother    Thyroid disease Mother    Obesity Mother    Macular degeneration Father    Thyroid disease Father    Diabetes Maternal Uncle    Heart attack Maternal Uncle    Hypertension Other    Prostate cancer Other    Heart disease Other    Colon cancer Neg Hx      Current Outpatient Medications:    albuterol (VENTOLIN HFA) 108 (90 Base) MCG/ACT inhaler, Inhale 2 puffs into the lungs every 6 (six) hours as needed for wheezing or shortness of breath., Disp: 8 g, Rfl: 2   amoxicillin-clavulanate (AUGMENTIN) 875-125 MG tablet, Take 1 tablet by mouth 2 (two) times daily., Disp: 20 tablet, Rfl: 0   azelastine (ASTELIN) 0.1 % nasal spray, Place 2 sprays into both nostrils 2 (two) times daily. Use in each nostril as directed, Disp: 30 mL, Rfl: 2    Continuous Blood Gluc Receiver (FREESTYLE LIBRE 2 READER) DEVI, 1 Device by Does not apply route as directed., Disp: 1 each, Rfl: 0   Continuous Glucose Sensor (FREESTYLE LIBRE 3 SENSOR) MISC, 1 Device by Does not apply route every 14 (fourteen) days. Place 1 sensor on the skin every 14 days. Use to check glucose continuously (Patient not taking: Reported on 02/26/2023), Disp: 6 each, Rfl: 3   EPINEPHrine 0.3 mg/0.3 mL IJ SOAJ injection, Inject 0.3 mg into the muscle as needed for anaphylaxis., Disp: 1 each, Rfl: 0   ezetimibe (ZETIA) 10 MG tablet, Take 1 tablet (10 mg total) by mouth daily., Disp: 90 tablet, Rfl: 1   fluticasone (FLONASE) 50 MCG/ACT nasal spray, Place 2 sprays into both nostrils daily., Disp: 16 g, Rfl: 0   gabapentin (NEURONTIN) 300 MG capsule, Take 1 capsule (300 mg total) by mouth at bedtime., Disp: 90 capsule, Rfl: 3   Gabapentin 10 % CREA, Apply 1 Application topically at bedtime., Disp: 30 g, Rfl: 3   latanoprost (XALATAN) 0.005 % ophthalmic solution, Place 1 drop into both  eyes daily., Disp: , Rfl:    levocetirizine (XYZAL) 5 MG tablet, , Disp: , Rfl:    levothyroxine (SYNTHROID) 112 MCG tablet, Take 1 tablet (112 mcg total) by mouth daily., Disp: 90 tablet, Rfl: 2   lisinopril (ZESTRIL) 5 MG tablet, Take 1 tablet (5 mg total) by mouth daily., Disp: 90 tablet, Rfl: 2   loperamide (IMODIUM) 2 MG capsule, Take 1 capsule (2 mg total) by mouth 4 (four) times daily as needed for diarrhea or loose stools., Disp: 12 capsule, Rfl: 0   montelukast (SINGULAIR) 10 MG tablet, Take 1 tablet (10 mg total) by mouth daily., Disp: 30 tablet, Rfl: 2   omeprazole (PRILOSEC) 40 MG capsule, Take 1 capsule (40 mg total) by mouth daily., Disp: 60 capsule, Rfl: 5   ondansetron (ZOFRAN ODT) 4 MG disintegrating tablet, Take 1 tablet (4 mg total) by mouth every 8 (eight) hours as needed for nausea or vomiting., Disp: 20 tablet, Rfl: 0   ONETOUCH VERIO test strip, USE  STRIP TO CHECK GLUCOSE 4 TIMES  DAILY, Disp: 200 each, Rfl: 0   promethazine (PHENERGAN) 12.5 MG tablet, Take 1 tablet (12.5 mg total) by mouth every 6 (six) hours as needed for nausea or vomiting., Disp: 6 tablet, Rfl: 0   pseudoephedrine (SUDAFED) 30 MG tablet, Take 60 mg by mouth 2 (two) times daily as needed for congestion., Disp: , Rfl:    rosuvastatin (CRESTOR) 40 MG tablet, Take 1 tablet (40 mg total) by mouth daily., Disp: 90 tablet, Rfl: 3   Semaglutide, 1 MG/DOSE, 4 MG/3ML SOPN, Inject 1 mg as directed once a week., Disp: 9 mL, Rfl: 3   tadalafil (CIALIS) 5 MG tablet, Take 5 mg by mouth daily., Disp: , Rfl: 11   testosterone enanthate (DELATESTRYL) 200 MG/ML injection, Inject 200 mg into the muscle every 14 (fourteen) days. (Patient not taking: Reported on 02/26/2023), Disp: , Rfl:    timolol (TIMOPTIC) 0.5 % ophthalmic solution, Place 1 drop into both eyes daily., Disp: , Rfl: 2   Turmeric 500 MG CAPS, Take 500 mg by mouth daily., Disp: , Rfl:    vitamin C (ASCORBIC ACID) 500 MG tablet, Take 1,000 mg by mouth daily., Disp: , Rfl:    Vitamin D, Ergocalciferol, (DRISDOL) 1.25 MG (50000 UNIT) CAPS capsule, Take 1 capsule (50,000 Units total) by mouth See admin instructions. Take one capsule by mouth on Tues/Fridays, Disp: 24 capsule, Rfl: 0   Allergies  Allergen Reactions   Horseradish [Armoracia Rusticana Ext (Horseradish)]    Lipitor [Atorvastatin] Other (See Comments)    Leg cramps   Metformin And Related Other (See Comments)    GI upset     Review of Systems  Constitutional: Negative.   HENT: Negative.    Gastrointestinal: Negative.   Genitourinary: Negative.   Skin: Negative.   Allergic/Immunologic: Negative.   Neurological: Negative.   Hematological: Negative.      There were no vitals filed for this visit. There is no height or weight on file to calculate BMI.  Wt Readings from Last 3 Encounters:  02/07/23 (!) 304 lb (137.9 kg)  01/31/23 (!) 303 lb 12.8 oz (137.8 kg)  11/08/22 298 lb (135.2 kg)      Objective:  Physical Exam      Assessment And Plan:  There are no diagnoses linked to this encounter.   No follow-ups on file.  Patient was given opportunity to ask questions. Patient verbalized understanding of the plan and was able to repeat key elements  of the plan. All questions were answered to their satisfaction.  Gwynneth Aliment, MD  I, Gwynneth Aliment, MD, have reviewed all documentation for this visit. The documentation on 05/07/23 for the exam, diagnosis, procedures, and orders are all accurate and complete.   IF YOU HAVE BEEN REFERRED TO A SPECIALIST, IT MAY TAKE 1-2 WEEKS TO SCHEDULE/PROCESS THE REFERRAL. IF YOU HAVE NOT HEARD FROM US/SPECIALIST IN TWO WEEKS, PLEASE GIVE [place] A CALL AT [phone].   THE PATIENT IS ENCOURAGED TO PRACTICE SOCIAL DISTANCING DUE TO THE COVID-19 PANDEMIC.

## 2023-05-08 ENCOUNTER — Encounter: Payer: Self-pay | Admitting: Internal Medicine

## 2023-05-08 ENCOUNTER — Ambulatory Visit (INDEPENDENT_AMBULATORY_CARE_PROVIDER_SITE_OTHER): Payer: Medicare Other | Admitting: Internal Medicine

## 2023-05-08 VITALS — BP 118/82 | HR 85 | Temp 97.6°F | Ht 73.0 in | Wt 300.4 lb

## 2023-05-08 DIAGNOSIS — I700 Atherosclerosis of aorta: Secondary | ICD-10-CM | POA: Diagnosis not present

## 2023-05-08 DIAGNOSIS — E1165 Type 2 diabetes mellitus with hyperglycemia: Secondary | ICD-10-CM

## 2023-05-08 DIAGNOSIS — E1169 Type 2 diabetes mellitus with other specified complication: Secondary | ICD-10-CM | POA: Diagnosis not present

## 2023-05-08 DIAGNOSIS — E291 Testicular hypofunction: Secondary | ICD-10-CM

## 2023-05-08 DIAGNOSIS — Z2821 Immunization not carried out because of patient refusal: Secondary | ICD-10-CM | POA: Diagnosis not present

## 2023-05-08 DIAGNOSIS — E785 Hyperlipidemia, unspecified: Secondary | ICD-10-CM | POA: Diagnosis not present

## 2023-05-08 DIAGNOSIS — L719 Rosacea, unspecified: Secondary | ICD-10-CM

## 2023-05-08 DIAGNOSIS — E66812 Obesity, class 2: Secondary | ICD-10-CM

## 2023-05-08 DIAGNOSIS — E039 Hypothyroidism, unspecified: Secondary | ICD-10-CM

## 2023-05-08 NOTE — Assessment & Plan Note (Signed)
Chronic, he is currently taking levothyroxine daily. I will check thyroid panel and adjust meds as needed.

## 2023-05-08 NOTE — Assessment & Plan Note (Signed)
BMI 39. He is encouraged to initially strive for BMI less than 30 to decrease cardiac risk. He is advised to exercise no less than 150 minutes per week.

## 2023-05-08 NOTE — Assessment & Plan Note (Signed)
Chronic, LDL goal < 70.  He will c/w rosuvastatin 40mg  daily. He will c/w semaglutide weekly. He has done well with increased dose, 1mg  weekly.  He did not tolerate SGLT2i therapy in the past Importance of dietary/medication compliance was discussed with the patient. Endo and CCM pharmacist input is appreciated.

## 2023-05-08 NOTE — Assessment & Plan Note (Signed)
Chronic, LDL goal < 70.  He will continue with rosuvastatin 40mg  daily. He is encouraged to follow a heart healthy lifestyle.

## 2023-05-08 NOTE — Assessment & Plan Note (Signed)
Chronic, he is currently prescribed supplemental testosterone by Urology. I will check labs as below and forward results to Dr. Mena Goes for his review. I defer dose adjustments to Urology.

## 2023-05-08 NOTE — Patient Instructions (Signed)

## 2023-05-09 LAB — CBC
Hematocrit: 47.3 % (ref 37.5–51.0)
Hemoglobin: 15.6 g/dL (ref 13.0–17.7)
MCH: 31.6 pg (ref 26.6–33.0)
MCHC: 33 g/dL (ref 31.5–35.7)
MCV: 96 fL (ref 79–97)
Platelets: 251 10*3/uL (ref 150–450)
RBC: 4.93 x10E6/uL (ref 4.14–5.80)
RDW: 13.4 % (ref 11.6–15.4)
WBC: 7.6 10*3/uL (ref 3.4–10.8)

## 2023-05-09 LAB — CMP14+EGFR
ALT: 30 [IU]/L (ref 0–44)
AST: 30 [IU]/L (ref 0–40)
Albumin: 3.9 g/dL (ref 3.8–4.8)
Alkaline Phosphatase: 66 [IU]/L (ref 44–121)
BUN/Creatinine Ratio: 8 — ABNORMAL LOW (ref 10–24)
BUN: 9 mg/dL (ref 8–27)
Bilirubin Total: 0.5 mg/dL (ref 0.0–1.2)
CO2: 23 mmol/L (ref 20–29)
Calcium: 9.2 mg/dL (ref 8.6–10.2)
Chloride: 101 mmol/L (ref 96–106)
Creatinine, Ser: 1.09 mg/dL (ref 0.76–1.27)
Globulin, Total: 2.9 g/dL (ref 1.5–4.5)
Glucose: 176 mg/dL — ABNORMAL HIGH (ref 70–99)
Potassium: 4.8 mmol/L (ref 3.5–5.2)
Sodium: 139 mmol/L (ref 134–144)
Total Protein: 6.8 g/dL (ref 6.0–8.5)
eGFR: 71 mL/min/{1.73_m2} (ref >59)

## 2023-05-09 LAB — LIPID PANEL
Chol/HDL Ratio: 2.8 ratio (ref 0.0–5.0)
Cholesterol, Total: 102 mg/dL (ref 100–199)
HDL: 37 mg/dL — ABNORMAL LOW (ref >39)
LDL Chol Calc (NIH): 44 mg/dL (ref 0–99)
Triglycerides: 114 mg/dL (ref 0–149)
VLDL Cholesterol Cal: 21 mg/dL (ref 5–40)

## 2023-05-09 LAB — HEMOGLOBIN A1C
Est. average glucose Bld gHb Est-mCnc: 174 mg/dL
Hgb A1c MFr Bld: 7.7 % — ABNORMAL HIGH (ref 4.8–5.6)

## 2023-05-09 LAB — TSH+FREE T4
Free T4: 0.9 ng/dL (ref 0.82–1.77)
TSH: 8.51 u[IU]/mL — ABNORMAL HIGH (ref 0.450–4.500)

## 2023-05-09 LAB — TESTOSTERONE: Testosterone: 645 ng/dL (ref 264–916)

## 2023-05-15 ENCOUNTER — Other Ambulatory Visit: Payer: Self-pay | Admitting: *Deleted

## 2023-05-17 ENCOUNTER — Telehealth: Payer: Self-pay | Admitting: *Deleted

## 2023-05-17 NOTE — Progress Notes (Unsigned)
  Care Coordination Note  05/17/2023 Name: Connor Becker. MRN: [id number] DOB: 1950/12/22  Connor Becker. is a 72 y.o. year old male who is a primary care patient of [name], MD and is actively engaged with the care management team. I reached out to Connor Becker. by phone today to assist with re-scheduling a follow up visit with the RN Case Manager  Follow up plan: Unsuccessful telephone outreach attempt made. A HIPAA compliant phone message was left for the patient providing contact information and requesting a return call.   [facility]  Care Coordination Care Guide  Direct Dial: [phone]

## 2023-05-21 NOTE — Progress Notes (Signed)
  Care Coordination Note  05/21/2023 Name: Connor Becker. MRN: [id number] DOB: Jan 18, 1951  Connor Becker. is a 72 y.o. year old male who is a primary care patient of [name], MD and is actively engaged with the care management team. I reached out to Connor Becker. by phone today to assist with re-scheduling a follow up visit with the RN Case Manager  Follow up plan: Telephone appointment with care management team member scheduled for:06/07/23  [facility] Coordination Care Guide  Direct Dial: [phone]

## 2023-06-04 ENCOUNTER — Telehealth: Payer: Self-pay | Admitting: *Deleted

## 2023-06-04 NOTE — Progress Notes (Signed)
  Care Coordination Note  06/04/2023 Name: Connor Becker. MRN: [id number] DOB: Apr 26, 1951  Connor Laud. is a 72 y.o. year old male who is a primary care patient of [name], MD and is actively engaged with the care management team. I reached out to Connor Laud. by phone today to assist with re-scheduling a follow up visit with the RN Case Manager  Follow up plan: Unsuccessful telephone outreach attempt made. A HIPAA compliant phone message was left for the patient providing contact information and requesting a return call.   [facility]  Care Coordination Care Guide  Direct Dial: [phone]

## 2023-06-17 NOTE — Progress Notes (Signed)
  Care Coordination Note  06/17/2023 Name: [name]. MRN: [id number] DOB: 1950-12-29  Connor Becker. is a 72 y.o. year old male who is a primary care patient of [name], MD and is actively engaged with the care management team. I reached out to Connor Becker. by phone today to assist with re-scheduling a follow up visit with the RN Case Manager  Follow up plan: Unsuccessful telephone outreach attempt made. A HIPAA compliant phone message was left for the patient providing contact information and requesting a return call.   [facility]  Care Coordination Care Guide  Direct Dial: [phone]

## 2023-06-24 NOTE — Progress Notes (Signed)
  Care Coordination Note  06/24/2023 Name: Connor Becker. MRN: [id number] DOB: [birth year]  Connor Becker. is a 72 y.o. year old male who is a primary care patient of [name], MD and is actively engaged with the care management team. I reached out to Connor Becker. by phone today to assist with re-scheduling a follow up visit with the RN Case Manager  Follow up plan: Telephone appointment with care management team member scheduled for:07/01/23  [facility] Coordination Care Guide  Direct Dial: [phone]

## 2023-07-01 ENCOUNTER — Ambulatory Visit: Payer: Self-pay

## 2023-07-01 NOTE — Patient Outreach (Signed)
  Care Coordination   07/01/2023 Name: Connor Becker. MRN: [id number] DOB: August 10, 1950   Care Coordination Outreach Attempts:  An unsuccessful telephone outreach was attempted for a scheduled appointment today.  Follow Up Plan:  Additional outreach attempts will be made to offer the patient care coordination information and services.   Encounter Outcome:  No Answer   Care Coordination Interventions:  No, not indicated    Delsa Sale RN BSN CCM [place]  [facility], [facility] Nurse Care Coordinator  Direct Dial: [phone] Website: [email or web address]

## 2023-07-23 ENCOUNTER — Telehealth: Payer: Self-pay

## 2023-07-23 NOTE — Telephone Encounter (Signed)
 PAP: PAP application for Ozempic , (Novo Nordisk) has been mailed to pt's home address on file. Will fax provider portion of application to provider's office when pt's portion is received.    Please not I have faxed the provider portion of the application to Dr. Donell Butts for review

## 2023-08-13 ENCOUNTER — Other Ambulatory Visit: Payer: Self-pay | Admitting: Internal Medicine

## 2023-08-13 ENCOUNTER — Ambulatory Visit: Payer: Medicare Other | Admitting: Internal Medicine

## 2023-08-13 ENCOUNTER — Encounter: Payer: Self-pay | Admitting: Internal Medicine

## 2023-08-13 VITALS — BP 122/80 | HR 83 | Ht 73.0 in | Wt 300.0 lb

## 2023-08-13 DIAGNOSIS — E1142 Type 2 diabetes mellitus with diabetic polyneuropathy: Secondary | ICD-10-CM | POA: Diagnosis not present

## 2023-08-13 DIAGNOSIS — E785 Hyperlipidemia, unspecified: Secondary | ICD-10-CM | POA: Diagnosis not present

## 2023-08-13 DIAGNOSIS — E1169 Type 2 diabetes mellitus with other specified complication: Secondary | ICD-10-CM

## 2023-08-13 DIAGNOSIS — E1165 Type 2 diabetes mellitus with hyperglycemia: Secondary | ICD-10-CM

## 2023-08-13 LAB — POCT GLYCOSYLATED HEMOGLOBIN (HGB A1C): Hemoglobin A1C: 9.6 % — AB (ref 4.0–5.6)

## 2023-08-13 MED ORDER — ROSUVASTATIN CALCIUM 40 MG PO TABS: 40.0000 mg | ORAL_TABLET | Freq: Every day | ORAL | 3 refills | Status: AC

## 2023-08-13 MED ORDER — GLIMEPIRIDE 2 MG PO TABS: 2.0000 mg | ORAL_TABLET | Freq: Every day | ORAL | 3 refills | Status: DC

## 2023-08-13 NOTE — Progress Notes (Signed)
.   Name: Connor Becker.  Age/ Sex: 73 y.o., male   MRN/ DOB: [id number], [birth year]     PCP: [name], MD   Reason for Endocrinology Evaluation: Type 2 Diabetes Mellitus  Initial Endocrine Consultative Visit: 04/13/2020    PATIENT IDENTIFIER: Connor Becker. is a 73 y.o. male with a past medical history of T2DM,OSA, hypogonadism and dyslipidemia. The patient has followed with Endocrinology clinic since 04/13/2020 for consultative assistance with management of his diabetes.     DIABETIC HISTORY:  Connor Becker was diagnosed with DM2 in 2014,Metformin- GI side effect, Farxiga -severe urinary frequency preventing him from sleeping at night.  His hemoglobin A1c has ranged from 6.6%  in 2020, peaking at 7.7% in 2021.  On his initial visit to our clinic his A1c was 7.7%  ,  He was not on any glycemic medications. Started Rybelsus  Rybelsus stopped by 06/2021 due to nausea and vomiting and started pioglitazone but he did not start it  By 10/2021 he started using his wife's Ozempic and tolerated well  He self discontinued glipizide by August 2023-no reported intolerance just due to fear of hypoglycemia   SUBJECTIVE:   During the last visit (02/07/2023): A1c 8.1 %     Today (08/13/2023): Connor Becker is here for a follow up on diabetes.  He checks his blood sugars multiple times a day through CGM. The patient has not had hypoglycemic episodes since the last clinic visit.  He is accompanied by his spouse today He discontinued Ozempic, rosuvastatin and all his medications around Thanksgiving due to GI side effects.  Denies recent nausea or vomiting  Denies recent constipation but has occasional diarrhea    HOME DIABETES REGIMEN:  Ozempic 1 mg weekly -not taking Rosuvastatin 40 mg daily-not taking      Statin: yes ACE-I/ARB: yes   CONTINUOUS GLUCOSE MONITORING RECORD INTERPRETATION    Dates of Recording: 1/15-1/28/2025  Sensor description:freestyle  libre   Results statistics:   CGM use % of time 96  Average and SD 264/20.8  Time in range 2%  % Time Above 180 43  % Time above 250 55  % Time Below target 0      Glycemic patterns summary: BG's are high overnight and during the day Hyperglycemic episodes all day and night  Hypoglycemic episodes occurred N.A  Overnight periods: High     DIABETIC COMPLICATIONS: Microvascular complications:   Denies: CKD, retinopathy, neuropathy Last Eye Exam: Completed 10/24/2022  Macrovascular complications:   Denies: CAD, CVA, PVD   HISTORY:  Past Medical History:  Past Medical History:  Diagnosis Date   ALLERGIC RHINITIS    Chronic pain of both knees    DJD (degenerative joint disease) of knee    right   DM2 (diabetes mellitus, type 2) (HCC)    GERD    Glaucoma    History of nuclear stress test    Myoview 12/17: EF 53, no ischemia, Low Risk   HYPERLIPIDEMIA    myalgias from Lipitor   HYPOGONADISM, MALE    Hypothyroidism    INSOMNIA-SLEEP DISORDER-UNSPEC    Joint pain    Obesity    OSTEOARTHRITIS    SLEEP APNEA, OBSTRUCTIVE    no CPAP; repeat sleep test pending 07/2016   Unspecified hypothyroidism 12/04/2013   Vitamin D deficiency    Past Surgical History:  Past Surgical History:  Procedure Laterality Date   NASAL SEPTUM SURGERY     TONSILLECTOMY     UVULOPALATOPHARYNGOPLASTY  VASECTOMY     Social History:  reports that he has never smoked. He has never used smokeless tobacco. He reports that he does not currently use alcohol. He reports that he does not use drugs. Family History:  Family History  Problem Relation Age of Onset   Heart attack Mother 12       s/p MI   Other Mother        pacemaker   Diabetes Mother    High blood pressure Mother    High Cholesterol Mother    Thyroid disease Mother    Obesity Mother    Macular degeneration Father    Thyroid disease Father    Diabetes Maternal Uncle    Heart attack Maternal Uncle    Hypertension Other     Prostate cancer Other    Heart disease Other    Colon cancer Neg Hx      HOME MEDICATIONS: Allergies as of 08/13/2023       Reactions   Horseradish [armoracia [place]ana Ext (horseradish)]    Lipitor [atorvastatin] Other (See Comments)   Leg cramps   Metformin And Related Other (See Comments)   GI upset        Medication List        Accurate as of August 13, 2023  9:34 AM. If you have any questions, ask your nurse or doctor.          albuterol 108 (90 Base) MCG/ACT inhaler Commonly known as: VENTOLIN HFA Inhale 2 puffs into the lungs every 6 (six) hours as needed for wheezing or shortness of breath.   amoxicillin-clavulanate 875-125 MG tablet Commonly known as: AUGMENTIN Take 1 tablet by mouth 2 (two) times daily.   ascorbic acid 500 MG tablet Commonly known as: VITAMIN C Take 1,000 mg by mouth daily.   azelastine 0.1 % nasal spray Commonly known as: ASTELIN Place 2 sprays into both nostrils 2 (two) times daily. Use in each nostril as directed   EPINEPHrine 0.3 mg/0.3 mL Soaj injection Commonly known as: EPI-PEN Inject 0.3 mg into the muscle as needed for anaphylaxis.   ezetimibe 10 MG tablet Commonly known as: Zetia Take 1 tablet (10 mg total) by mouth daily.   fluticasone 50 MCG/ACT nasal spray Commonly known as: FLONASE Place 2 sprays into both nostrils daily.   FreeStyle Libre 2 Reader Devi 1 Device by Does not apply route as directed.   FreeStyle Libre 3 Sensor Misc 1 Device by Does not apply route every 14 (fourteen) days. Place 1 sensor on the skin every 14 days. Use to check glucose continuously   Gabapentin 10 % Crea Apply 1 Application topically at bedtime.   gabapentin 300 MG capsule Commonly known as: NEURONTIN Take 1 capsule (300 mg total) by mouth at bedtime.   latanoprost 0.005 % ophthalmic solution Commonly known as: XALATAN Place 1 drop into both eyes daily.   levocetirizine 5 MG tablet Commonly known as: XYZAL    levothyroxine 112 MCG tablet Commonly known as: SYNTHROID Take 1 tablet (112 mcg total) by mouth daily.   lisinopril 5 MG tablet Commonly known as: ZESTRIL Take 1 tablet (5 mg total) by mouth daily.   loperamide 2 MG capsule Commonly known as: IMODIUM Take 1 capsule (2 mg total) by mouth 4 (four) times daily as needed for diarrhea or loose stools.   montelukast 10 MG tablet Commonly known as: SINGULAIR Take 1 tablet (10 mg total) by mouth daily.   omeprazole 40 MG capsule Commonly known  as: PRILOSEC Take 1 capsule (40 mg total) by mouth daily.   ondansetron 4 MG disintegrating tablet Commonly known as: Zofran ODT Take 1 tablet (4 mg total) by mouth every 8 (eight) hours as needed for nausea or vomiting.   OneTouch Verio test strip Generic drug: glucose blood USE  STRIP TO CHECK GLUCOSE 4 TIMES DAILY   promethazine 12.5 MG tablet Commonly known as: PHENERGAN Take 1 tablet (12.5 mg total) by mouth every 6 (six) hours as needed for nausea or vomiting.   pseudoephedrine 30 MG tablet Commonly known as: SUDAFED Take 60 mg by mouth 2 (two) times daily as needed for congestion.   rosuvastatin 40 MG tablet Commonly known as: CRESTOR Take 1 tablet (40 mg total) by mouth daily.   Semaglutide (1 MG/DOSE) 4 MG/3ML Sopn Inject 1 mg as directed once a week.   tadalafil 5 MG tablet Commonly known as: CIALIS Take 5 mg by mouth daily.   testosterone enanthate 200 MG/ML injection Commonly known as: DELATESTRYL Inject 200 mg into the muscle every 14 (fourteen) days.   timolol 0.5 % ophthalmic solution Commonly known as: TIMOPTIC Place 1 drop into both eyes daily.   Turmeric 500 MG Caps Take 500 mg by mouth daily.   Vitamin D (Ergocalciferol) 1.25 MG (50000 UNIT) Caps capsule Commonly known as: DRISDOL Take 1 capsule (50,000 Units total) by mouth See admin instructions. Take one capsule by mouth on Tues/Fridays         OBJECTIVE:   Vital Signs: BP 122/80 (BP  Location: Right Arm, Patient Position: Sitting, Cuff Size: Normal)   Pulse 83   Ht 6\' 1"  (1.854 m)   Wt 300 lb (136.1 kg)   SpO2 97%   BMI 39.58 kg/m   Wt Readings from Last 3 Encounters:  08/13/23 300 lb (136.1 kg)  05/08/23 (!) 300 lb 6.4 oz (136.3 kg)  02/07/23 (!) 304 lb (137.9 kg)     Exam: General: Pt appears well and is in NAD  Lungs: Clear with good BS bilat   Heart: RRR   Extremities: Trace pretibial edema.   Neuro: MS is good with appropriate affect, pt is alert and Ox3   DM foot exam: 08/13/2023   The skin of the feet is intact without sores or ulcerations. The pedal pulses are 1+ on right and 1+ on left. The sensation is decreased to a screening 5.07, 10 gram monofilament bilaterally     DATA REVIEWED:  Lab Results  Component Value Date   HGBA1C 7.7 (H) 05/08/2023   HGBA1C 8.1 (H) 01/31/2023   HGBA1C 6.9 (H) 11/08/2022     05/08/23 10:31 @ careeverywhere Sodium: 139 Potassium: 4.8 Chloride: 101 CO2: 23 Glucose: 176 (H) BUN: 9 Creatinine: 1.09 Calcium: 9.2 BUN/Creatinine Ratio: 8 (L) eGFR: 71 Alkaline Phosphatase: 66 Albumin: 3.9 AST: 30 ALT: 30 Total Protein: 6.8 Total Bilirubin: 0.5 Total CHOL/HDL Ratio: 2.8 Cholesterol, Total: 102 HDL Cholesterol: 37 (L) Triglycerides: 114 VLDL Cholesterol Cal: 21 LDL Chol Calc (NIH): 44   Old records , labs and images have been reviewed.    ASSESSMENT / PLAN / RECOMMENDATIONS:   1) Type 2 Diabetes Mellitus, Poorly  controlled, With neuropathic complications - Most recent A1c of 9.6% Goal A1c < 7.0 %.    -Patient has been without Ozempic since Thanksgiving, 2024 due to GI side effects.  The patient did not want to try any glycemic agents at the time as he was tired of everything -The patient questions the process of diabetes,  questions about ketones, he was wondering if there is a test to see if his body is using the insulin, we did discuss the pathophysiology of T2DM and insulin resistance.  He  is at low risk for developing ketones at this time -In the past he endorsed intolerance to Rybelsus -He is intolerant to metformin -He is intolerant to [place] due to frequency impeding his restful sleep at night -I have recommended starting him on glimepiride, emphasized the importance of taking it 15-20 minutes before breakfast to prevent hypoglycemia -He was on glipizide prior to starting Ozempic but he self discontinued due to fear of hypoglycemia but he did not have any side effects to it  MEDICATIONS: -Start glimepiride 2 mg daily     EDUCATION / INSTRUCTIONS: BG monitoring instructions: Patient is instructed to check his blood sugars 2 times a day, fasting and bedtime . Call [facility] if: BG persistently < 70  I reviewed the Rule of 15 for the treatment of hypoglycemia in detail with the patient. Literature supplied.    2) Diabetic complications:  Eye: Does not have known diabetic retinopathy.  Neuro/ Feet: Does not have known diabetic peripheral neuropathy .  Renal: Patient does not have known baseline CKD.      3) Dyslipidemia :   -LDL at goal from 04/2023 -He discontinued rosuvastatin while he was having GI side effects to Ozempic -We discussed cardiovascular benefits statin therapy and I have encouraged him to restart it again  Medication Restart rosuvastatin 40 mg daily  F/U in 3 months     Signed electronically by: Lyndle Herrlich, MD  [facility] Endocrinology  [facility] [address] [place] [place], [place] [address] Phone: [phone] FAX: [phone]   CC: [name], MD [address] [place] [address] Phone: [phone]  Fax: [phone]  Return to Endocrinology clinic as below: Future Appointments  Date Time Provider Department Center  09/09/2023  1:30 PM Riley Churches, RN [facility] None  10/18/2023 11:00 AM [facility] None  10/31/2023 11:00 AM [name],  MD [facility] None

## 2023-08-13 NOTE — Patient Instructions (Addendum)
-   Start Glimepiride 2 mg , 1 tablet before Breakfast  - Restart Rosuvastatin 40 mg daily     HOW TO TREAT LOW BLOOD SUGARS (Blood sugar LESS THAN 70 MG/DL) Please follow the RULE OF 15 for the treatment of hypoglycemia treatment (when your (blood sugars are less than 70 mg/dL)   STEP 1: Take 15 grams of carbohydrates when your blood sugar is low, which includes:  3-4 GLUCOSE TABS  OR 3-4 OZ OF JUICE OR REGULAR SODA OR ONE TUBE OF GLUCOSE GEL    STEP 2: RECHECK blood sugar in 15 MINUTES STEP 3: If your blood sugar is still low at the 15 minute recheck --> then, go back to STEP 1 and treat AGAIN with another 15 grams of carbohydrates.

## 2023-08-14 ENCOUNTER — Encounter: Payer: Self-pay | Admitting: Internal Medicine

## 2023-08-15 ENCOUNTER — Encounter: Payer: Self-pay | Admitting: Internal Medicine

## 2023-08-21 NOTE — Telephone Encounter (Signed)
 Medication Dc'ed at 1/28 appt-PAP no longer needed

## 2023-09-04 DIAGNOSIS — R948 Abnormal results of function studies of other organs and systems: Secondary | ICD-10-CM | POA: Diagnosis not present

## 2023-09-09 ENCOUNTER — Ambulatory Visit: Payer: Self-pay

## 2023-09-09 NOTE — Patient Outreach (Signed)
 Care Coordination   Follow Up Visit Note   09/09/2023 Name: Connor Becker. MRN: [id number] DOB: [birth year]  Connor Becker. is a 73 y.o. year old male who sees [name], MD for primary care. I spoke with  Connor Becker. by phone today.  What matters to the patients health and wellness today?  Patient would like to get his evening blood sugars under better control.     Goals Addressed             This Visit's Progress    CCM (ATHEROSCLEROSIS OF AORTA) EXPECTED OUTCOME:  MONITOR, SELF-MANAGE AND REDUCE SYMPTOMS OF ATHEROSCLEROSIS OF AORTA   On track    Care Coordination Interventions: Educated patient about the basic disease process related to Aortic Atherosclerosis  Provider established cholesterol goals reviewed Reviewed medications with patient and discussed importance of medication adherence Counseled on importance of regular laboratory monitoring as prescribed Reviewed role and benefits of statin for ASCVD risk reduction Assessed social determinant of health barriers  Discussed plans with patient for ongoing care coordination follow up and provided patient with direct contact information for nurse care coordinator Lipid Panel     Component Value Date/Time   CHOL 102 05/08/2023 1031   TRIG 114 05/08/2023 1031   HDL 37 (L) 05/08/2023 1031   CHOLHDL 2.8 05/08/2023 1031   CHOLHDL 5 11/17/2015 1428   VLDL 60.0 (H) 11/17/2015 1428   LDLCALC 44 05/08/2023 1031   LDLDIRECT 148.0 11/17/2015 1428   LABVLDL 21 05/08/2023 1031           CCM (DIABETES) EXPECTED OUTCOME:  MONITOR, SELF-MANAGE AND REDUCE SYMPTOMS OF DIABETES   On track    Care Coordination Interventions: Provided education to patient about basic DM disease process Reviewed medications with patient and discussed importance of medication adherence, medication reconciliation completed  Provided patient with written educational materials related to hypo and hyperglycemia and importance of  correct treatment Review of patient status, including review of consultants reports, relevant laboratory and other test results, and medications completed Counseled on Diabetic diet, my plate method, 409 minutes of moderate intensity exercise/week Mailed printed educational materials to patient related to diabetes management  Discussed plans with patient for ongoing care coordination follow up and provided patient with direct contact information for nurse care coordinator Assessed patient's understanding of A1c goal: <7% Lab Results  Component Value Date   HGBA1C 9.6 (A) 08/13/2023      Interventions Today    Flowsheet Row Most Recent Value  Chronic Disease   Chronic disease during today's visit Diabetes, Other  [Atherosclerosis of Aorta]  General Interventions   General Interventions Discussed/Reviewed General Interventions Discussed, General Interventions Reviewed, Labs, Durable Medical Equipment (DME), Doctor Visits  Doctor Visits Discussed/Reviewed Doctor Visits Reviewed, Doctor Visits Discussed, Specialist, PCP  Durable Medical Equipment (DME) Glucomoter  Education Interventions   Education Provided Provided Education  Provided Verbal Education On Nutrition, Labs, Blood Sugar Monitoring, Medication, When to see the doctor  Labs Reviewed Hgb A1c, Lipid Profile  Nutrition Interventions   Nutrition Discussed/Reviewed Nutrition Discussed, Nutrition Reviewed, Carbohydrate meal planning, Portion sizes  Pharmacy Interventions   Pharmacy Dicussed/Reviewed Pharmacy Topics Discussed, Pharmacy Topics Reviewed, Medications and their functions, Medication Adherence, Affording Medications          SDOH assessments and interventions completed:  Yes  SDOH Interventions Today    Flowsheet Row Most Recent Value  SDOH Interventions   Food Insecurity Interventions Intervention Not Indicated  Housing Interventions Intervention Not  Indicated  Transportation Interventions Intervention Not  Indicated  Utilities Interventions Intervention Not Indicated        Care Coordination Interventions:  Yes, provided   Follow up plan: Follow up call scheduled for 10/07/23 @12 :00 PM    Encounter Outcome:  Patient Visit Completed

## 2023-09-09 NOTE — Patient Instructions (Signed)
 Visit Information  Thank you for taking time to visit with me today. Please don't hesitate to contact me if I can be of assistance to you.   Following are the goals we discussed today:   Goals Addressed             This Visit's Progress    CCM (ATHEROSCLEROSIS OF AORTA) EXPECTED OUTCOME:  MONITOR, SELF-MANAGE AND REDUCE SYMPTOMS OF ATHEROSCLEROSIS OF AORTA   On track    Care Coordination Interventions: Educated patient about the basic disease process related to Aortic Atherosclerosis  Provider established cholesterol goals reviewed Reviewed medications with patient and discussed importance of medication adherence Counseled on importance of regular laboratory monitoring as prescribed Reviewed role and benefits of statin for ASCVD risk reduction Assessed social determinant of health barriers  Discussed plans with patient for ongoing care coordination follow up and provided patient with direct contact information for nurse care coordinator Lipid Panel     Component Value Date/Time   CHOL 102 05/08/2023 1031   TRIG 114 05/08/2023 1031   HDL 37 (L) 05/08/2023 1031   CHOLHDL 2.8 05/08/2023 1031   CHOLHDL 5 11/17/2015 1428   VLDL 60.0 (H) 11/17/2015 1428   LDLCALC 44 05/08/2023 1031   LDLDIRECT 148.0 11/17/2015 1428   LABVLDL 21 05/08/2023 1031        CCM (DIABETES) EXPECTED OUTCOME:  MONITOR, SELF-MANAGE AND REDUCE SYMPTOMS OF DIABETES   On track    Care Coordination Interventions: Provided education to patient about basic DM disease process Reviewed medications with patient and discussed importance of medication adherence, medication reconciliation completed  Provided patient with written educational materials related to hypo and hyperglycemia and importance of correct treatment Review of patient status, including review of consultants reports, relevant laboratory and other test results, and medications completed Counseled on Diabetic diet, my plate method, 161 minutes of moderate  intensity exercise/week Mailed printed educational materials to patient related to diabetes management  Discussed plans with patient for ongoing care coordination follow up and provided patient with direct contact information for nurse care coordinator Assessed patient's understanding of A1c goal: <7% Lab Results  Component Value Date   HGBA1C 9.6 (A) 08/13/2023          Our next appointment is by telephone on 10/07/23 at 12:00 PM  Please call the care guide team at [phone] if you need to cancel or reschedule your appointment.   If you are experiencing a Mental Health or Behavioral Health Crisis or need someone to talk to, please call 1-800-273-TALK (toll free, 24 hour hotline)  Patient verbalizes understanding of instructions and care plan provided today and agrees to view in MyChart. Active MyChart status and patient understanding of how to access instructions and care plan via MyChart confirmed with patient.     Delsa Sale RN BSN CCM [place]  [facility], [facility] Health Nurse Care Coordinator  Direct Dial: [phone] Website: [email or web address]

## 2023-09-11 DIAGNOSIS — R350 Frequency of micturition: Secondary | ICD-10-CM | POA: Diagnosis not present

## 2023-10-02 DIAGNOSIS — H47233 Glaucomatous optic atrophy, bilateral: Secondary | ICD-10-CM | POA: Diagnosis not present

## 2023-10-02 DIAGNOSIS — E1165 Type 2 diabetes mellitus with hyperglycemia: Secondary | ICD-10-CM | POA: Diagnosis not present

## 2023-10-02 DIAGNOSIS — H401133 Primary open-angle glaucoma, bilateral, severe stage: Secondary | ICD-10-CM | POA: Diagnosis not present

## 2023-10-02 DIAGNOSIS — Z961 Presence of intraocular lens: Secondary | ICD-10-CM | POA: Diagnosis not present

## 2023-10-03 ENCOUNTER — Encounter: Payer: Self-pay | Admitting: Internal Medicine

## 2023-10-03 ENCOUNTER — Ambulatory Visit (INDEPENDENT_AMBULATORY_CARE_PROVIDER_SITE_OTHER): Admitting: Internal Medicine

## 2023-10-03 VITALS — BP 122/78 | HR 87 | Temp 98.1°F | Ht 73.0 in | Wt 300.8 lb

## 2023-10-03 DIAGNOSIS — E785 Hyperlipidemia, unspecified: Secondary | ICD-10-CM

## 2023-10-03 DIAGNOSIS — E1169 Type 2 diabetes mellitus with other specified complication: Secondary | ICD-10-CM

## 2023-10-03 DIAGNOSIS — E039 Hypothyroidism, unspecified: Secondary | ICD-10-CM

## 2023-10-03 DIAGNOSIS — E66812 Obesity, class 2: Secondary | ICD-10-CM | POA: Diagnosis not present

## 2023-10-03 MED ORDER — MOUNJARO 5 MG/0.5ML ~~LOC~~ SOAJ: 5.0000 mg | SUBCUTANEOUS | 0 refills | Status: DC

## 2023-10-03 NOTE — Progress Notes (Signed)
 I,Victoria T Deloria Lair, CMA,acting as a Neurosurgeon for Gwynneth Aliment, MD.,have documented all relevant documentation on the behalf of Gwynneth Aliment, MD,as directed by  Gwynneth Aliment, MD while in the presence of Gwynneth Aliment, MD.  Subjective:  Patient ID: Connor Becker , male    DOB: 09/16/1950 , 73 y.o.   MRN: [id number]  Chief Complaint  Patient presents with   Diabetes   Hyperlipidemia   Hypothyroidism    HPI  Patient presents today for a diabetes & thyroid check. Patient reports compliance with his medications. He denies having any specific questions or concerns. Denies having any headaches, chest pain & sob.  + He would like to switch over to [facility]. He and his wife have spoken to friends about their medical issues to get information. He feels his endocrinologist does not listen to his concerns. He took one shot of his wife's Mounjaro 5mg  and did well. He has not tolerated Ozempic in the past. He wants to now get his own prescription.       Diabetes He presents for his follow-up diabetic visit. He has type 2 diabetes mellitus. His disease course has been stable. There are no hypoglycemic associated symptoms. Pertinent negatives for diabetes include no polydipsia, no polyphagia and no polyuria. There are no hypoglycemic complications. Risk factors for coronary artery disease include diabetes mellitus, dyslipidemia, male sex, obesity and sedentary lifestyle. He participates in exercise intermittently. An ACE inhibitor/angiotensin II receptor blocker is being taken. Eye exam is not current.  Hyperlipidemia This is a chronic problem. The current episode started more than 1 year ago. The problem is controlled. Exacerbating diseases include diabetes, hypothyroidism and obesity. Pertinent negatives include no shortness of breath. Current antihyperlipidemic treatment includes statins. The current treatment provides moderate improvement of lipids.     Past Medical History:   Diagnosis Date   ALLERGIC RHINITIS    Chronic pain of both knees    DJD (degenerative joint disease) of knee    right   DM2 (diabetes mellitus, type 2) (HCC)    GERD    Glaucoma    History of nuclear stress test    Myoview 12/17: EF 53, no ischemia, Low Risk   HYPERLIPIDEMIA    myalgias from Lipitor   HYPOGONADISM, MALE    Hypothyroidism    INSOMNIA-SLEEP DISORDER-UNSPEC    Joint pain    Obesity    OSTEOARTHRITIS    SLEEP APNEA, OBSTRUCTIVE    no CPAP; repeat sleep test pending 07/2016   Unspecified hypothyroidism 12/04/2013   Vitamin D deficiency      Family History  Problem Relation Age of Onset   Heart attack Mother 72       s/p MI   Other Mother        pacemaker   Diabetes Mother    High blood pressure Mother    High Cholesterol Mother    Thyroid disease Mother    Obesity Mother    Macular degeneration Father    Thyroid disease Father    Diabetes Maternal Uncle    Heart attack Maternal Uncle    Hypertension Other    Prostate cancer Other    Heart disease Other    Colon cancer Neg Hx      Current Outpatient Medications:    fluticasone (FLONASE) 50 MCG/ACT nasal spray, Place 2 sprays into both nostrils daily., Disp: 16 g, Rfl: 0   latanoprost (XALATAN) 0.005 % ophthalmic solution, Place 1 drop into both eyes  daily., Disp: , Rfl:    levothyroxine (SYNTHROID) 112 MCG tablet, Take 1 tablet (112 mcg total) by mouth daily., Disp: 90 tablet, Rfl: 2   lisinopril (ZESTRIL) 5 MG tablet, Take 1 tablet (5 mg total) by mouth daily., Disp: 90 tablet, Rfl: 2   rosuvastatin (CRESTOR) 40 MG tablet, Take 1 tablet (40 mg total) by mouth daily., Disp: 90 tablet, Rfl: 3   Turmeric 500 MG CAPS, Take 500 mg by mouth daily., Disp: , Rfl:    albuterol (VENTOLIN HFA) 108 (90 Base) MCG/ACT inhaler, Inhale 2 puffs into the lungs every 6 (six) hours as needed for wheezing or shortness of breath. (Patient not taking: Reported on 08/13/2023), Disp: 8 g, Rfl: 2   azelastine (ASTELIN) 0.1 %  nasal spray, Place 2 sprays into both nostrils 2 (two) times daily. Use in each nostril as directed (Patient not taking: Reported on 08/13/2023), Disp: 30 mL, Rfl: 2   Continuous Blood Gluc Receiver (FREESTYLE LIBRE 2 READER) DEVI, 1 Device by Does not apply route as directed. (Patient not taking: Reported on 10/03/2023), Disp: 1 each, Rfl: 0   Continuous Glucose Sensor (FREESTYLE LIBRE 3 SENSOR) MISC, 1 Device by Does not apply route every 14 (fourteen) days. Place 1 sensor on the skin every 14 days. Use to check glucose continuously (Patient not taking: Reported on 02/26/2023), Disp: 6 each, Rfl: 3   EPINEPHrine 0.3 mg/0.3 mL IJ SOAJ injection, Inject 0.3 mg into the muscle as needed for anaphylaxis. (Patient not taking: Reported on 08/13/2023), Disp: 1 each, Rfl: 0   ezetimibe (ZETIA) 10 MG tablet, Take 1 tablet by mouth once daily (Patient not taking: Reported on 10/03/2023), Disp: 90 tablet, Rfl: 0   gabapentin (NEURONTIN) 300 MG capsule, Take 1 capsule (300 mg total) by mouth at bedtime. (Patient not taking: Reported on 10/03/2023), Disp: 90 capsule, Rfl: 3   Gabapentin 10 % CREA, Apply 1 Application topically at bedtime. (Patient not taking: Reported on 10/03/2023), Disp: 30 g, Rfl: 3   levocetirizine (XYZAL) 5 MG tablet, , Disp: , Rfl:    ONETOUCH VERIO test strip, USE  STRIP TO CHECK GLUCOSE 4 TIMES DAILY (Patient not taking: Reported on 10/03/2023), Disp: 200 each, Rfl: 0   tadalafil (CIALIS) 5 MG tablet, Take 5 mg by mouth daily. (Patient not taking: Reported on 08/13/2023), Disp: , Rfl: 11   testosterone enanthate (DELATESTRYL) 200 MG/ML injection, Inject 200 mg into the muscle every 14 (fourteen) days. (Patient not taking: Reported on 02/26/2023), Disp: , Rfl:    timolol (TIMOPTIC) 0.5 % ophthalmic solution, Place 1 drop into both eyes daily. (Patient not taking: Reported on 10/03/2023), Disp: , Rfl: 2   tirzepatide (MOUNJARO) 5 MG/0.5ML Pen, Inject 5 mg into the skin once a week. (Patient not taking:  Reported on 10/03/2023), Disp: 2 mL, Rfl: 0   vitamin C (ASCORBIC ACID) 500 MG tablet, Take 1,000 mg by mouth daily. (Patient not taking: Reported on 08/13/2023), Disp: , Rfl:    Vitamin D, Ergocalciferol, (DRISDOL) 1.25 MG (50000 UNIT) CAPS capsule, Take 1 capsule (50,000 Units total) by mouth See admin instructions. Take one capsule by mouth on Tues/Fridays (Patient not taking: Reported on 08/13/2023), Disp: 24 capsule, Rfl: 0   Allergies  Allergen Reactions   Horseradish [Armoracia Rusticana Ext (Horseradish)]    Lipitor [Atorvastatin] Other (See Comments)    Leg cramps   Metformin And Related Other (See Comments)    GI upset     Review of Systems  Constitutional: Negative.  HENT: Negative.    Respiratory: Negative.  Negative for shortness of breath.   Cardiovascular: Negative.   Gastrointestinal: Negative.   Endocrine: Negative for polydipsia, polyphagia and polyuria.  Genitourinary: Negative.   Skin: Negative.   Allergic/Immunologic: Negative.   Neurological: Negative.   Hematological: Negative.      Today's Vitals   10/03/23 0825  BP: 122/78  Pulse: 87  Temp: 98.1 F (36.7 C)  SpO2: 98%  Weight: (!) 300 lb 12.8 oz (136.4 kg)  Height: 6\' 1"  (1.854 m)   Body mass index is 39.69 kg/m.  Wt Readings from Last 3 Encounters:  10/03/23 (!) 300 lb 12.8 oz (136.4 kg)  08/13/23 300 lb (136.1 kg)  05/08/23 (!) 300 lb 6.4 oz (136.3 kg)     Objective:  Physical Exam Vitals and nursing note reviewed.  Constitutional:      Appearance: Normal appearance. He is obese.  HENT:     Head: Normocephalic and atraumatic.  Eyes:     Extraocular Movements: Extraocular movements intact.  Cardiovascular:     Rate and Rhythm: Normal rate and regular rhythm.     Heart sounds: Normal heart sounds.  Pulmonary:     Effort: Pulmonary effort is normal.     Breath sounds: Normal breath sounds.  Musculoskeletal:     Cervical back: Normal range of motion.  Skin:    General: Skin is warm.   Neurological:     General: No focal deficit present.     Mental Status: He is alert.  Psychiatric:        Mood and Affect: Mood normal.         Assessment And Plan:  Hyperlipidemia due to type 2 diabetes mellitus (HCC) Assessment & Plan: Rx Mounjaro 5mg  was sent to the pharmacy. Pt was also given samples of 2.5mg  dose because it may take up to 2 weeks to get medication approved. Pt advised that it is not only medication that is needed to treat diabetes, lifestyle changes are needed as well.  He is adamant that he will not exercise, he moves enough during his workday.  He does admit to making dietary changes - he is eating less and making healthier choices. He was commended for making the dietary changes. Pt advised goal fasting BS is less than 120. He did share Libre readings that showed drastic improvement in BS with Mounjaro. He has not experienced any GI side effects thus far, minor bloating.  He had severe GI distress with Ozempic. Advised pt he will need to let me know in 4-8 weeks if dose titration is needed. He asked what was the "purpose" of increasing the medication, reminded him that our goal is to get him to an A1c that decreases his risk of heart, kidney, and brain complications. He stated that he wants to get an answer that is "logical to him". I advised pt that if he did not feel he was getting the answers he wanted/needed, then he could transfer to another practice. Physical scheduled in 3 months.   Orders: -     Mounjaro; Inject 5 mg into the skin once a week. (Patient not taking: Reported on 10/03/2023)  Dispense: 2 mL; Refill: 0 -     Microalbumin / creatinine urine ratio -     CMP14+EGFR -     Hemoglobin A1c -     AMB Referral [facility] Care Management  Primary hypothyroidism Assessment & Plan: Chronic, he is currently taking levothyroxine daily. I will check thyroid  panel and adjust meds as needed.   Orders: -     TSH + free T4 -     AMB Referral [facility] Care  Management  Class 2 severe obesity with body mass index (BMI) of 35 to 39.9 with serious comorbidity (HCC) Assessment & Plan: BMI 39. He is encouraged to initially strive for BMI less than 30 to decrease cardiac risk. He is advised to exercise no less than 150 minutes per week.     Orders: -     AMB Referral [facility] Care Management  He is encouraged to strive for BMI less than 30 to decrease cardiac risk. Advised to aim for at least 150 minutes of exercise per week.    Return move physical to June please.  Patient was given opportunity to ask questions. Patient verbalized understanding of the plan and was able to repeat key elements of the plan. All questions were answered to their satisfaction.    I, Gwynneth Aliment, MD, have reviewed all documentation for this visit. The documentation on 10/03/23 for the exam, diagnosis, procedures, and orders are all accurate and complete.   IF YOU HAVE BEEN REFERRED TO A SPECIALIST, IT MAY TAKE 1-2 WEEKS TO SCHEDULE/PROCESS THE REFERRAL. IF YOU HAVE NOT HEARD FROM US/SPECIALIST IN TWO WEEKS, PLEASE GIVE [place] A CALL AT [phone].   THE PATIENT IS ENCOURAGED TO PRACTICE SOCIAL DISTANCING DUE TO THE COVID-19 PANDEMIC.

## 2023-10-03 NOTE — Assessment & Plan Note (Signed)
Chronic, he is currently taking levothyroxine daily. I will check thyroid panel and adjust meds as needed.

## 2023-10-03 NOTE — Patient Instructions (Signed)

## 2023-10-03 NOTE — Assessment & Plan Note (Addendum)
 Rx Mounjaro 5mg  was sent to the pharmacy. Pt was also given samples of 2.5mg  dose because it may take up to 2 weeks to get medication approved. Pt advised that it is not only medication that is needed to treat diabetes, lifestyle changes are needed as well.  He is adamant that he will not exercise, he moves enough during his workday.  He does admit to making dietary changes - he is eating less and making healthier choices. He was commended for making the dietary changes. Pt advised goal fasting BS is less than 120. He did share Libre readings that showed drastic improvement in BS with Mounjaro. He has not experienced any GI side effects thus far, minor bloating.  He had severe GI distress with Ozempic. Advised pt he will need to let me know in 4-8 weeks if dose titration is needed. He asked what was the "purpose" of increasing the medication, reminded him that our goal is to get him to an A1c that decreases his risk of heart, kidney, and brain complications. He stated that he wants to get an answer that is "logical to him". I advised pt that if he did not feel he was getting the answers he wanted/needed, then he could transfer to another practice. Physical scheduled in 3 months.

## 2023-10-03 NOTE — Assessment & Plan Note (Signed)
BMI 39. He is encouraged to initially strive for BMI less than 30 to decrease cardiac risk. He is advised to exercise no less than 150 minutes per week.

## 2023-10-04 LAB — CMP14+EGFR
ALT: 31 IU/L (ref 0–44)
AST: 29 IU/L (ref 0–40)
Albumin: 4.5 g/dL (ref 3.8–4.8)
Alkaline Phosphatase: 84 IU/L (ref 44–121)
BUN/Creatinine Ratio: 12 (ref 10–24)
BUN: 15 mg/dL (ref 8–27)
Bilirubin Total: 0.4 mg/dL (ref 0.0–1.2)
CO2: 25 mmol/L (ref 20–29)
Calcium: 9.8 mg/dL (ref 8.6–10.2)
Chloride: 97 mmol/L (ref 96–106)
Creatinine, Ser: 1.27 mg/dL (ref 0.76–1.27)
Globulin, Total: 2.9 g/dL (ref 1.5–4.5)
Glucose: 132 mg/dL — ABNORMAL HIGH (ref 70–99)
Potassium: 4.7 mmol/L (ref 3.5–5.2)
Sodium: 140 mmol/L (ref 134–144)
Total Protein: 7.4 g/dL (ref 6.0–8.5)
eGFR: 59 mL/min/{1.73_m2} — ABNORMAL LOW (ref >59)

## 2023-10-04 LAB — TSH+FREE T4
Free T4: 1.31 ng/dL (ref 0.82–1.77)
TSH: 6.45 u[IU]/mL — ABNORMAL HIGH (ref 0.450–4.500)

## 2023-10-04 LAB — MICROALBUMIN / CREATININE URINE RATIO
Creatinine, Urine: 258.5 mg/dL
Microalb/Creat Ratio: 24 mg/g{creat} (ref 0–29)
Microalbumin, Urine: 62.7 ug/mL

## 2023-10-04 LAB — HEMOGLOBIN A1C
Est. average glucose Bld gHb Est-mCnc: 235 mg/dL
Hgb A1c MFr Bld: 9.8 % — ABNORMAL HIGH (ref 4.8–5.6)

## 2023-10-07 ENCOUNTER — Ambulatory Visit: Payer: Self-pay

## 2023-10-07 NOTE — Patient Outreach (Signed)
 Care Coordination   10/07/2023 Name: Connor Becker. MRN: [id number] DOB: [birth year]   Care Coordination Outreach Attempts:  An unsuccessful outreach was attempted for an appointment today.  Follow Up Plan:  Additional outreach attempts will be made to offer the patient complex care management information and services.   Encounter Outcome:  No Answer   Care Coordination Interventions:  No, not indicated    Delsa Sale RN BSN CCM [place]  [facility], [facility] Health Nurse Care Coordinator  Direct Dial: [phone] Website: [email or web address]

## 2023-10-16 ENCOUNTER — Telehealth: Payer: Self-pay | Admitting: Pharmacist

## 2023-10-16 DIAGNOSIS — E119 Type 2 diabetes mellitus without complications: Secondary | ICD-10-CM

## 2023-10-16 NOTE — Progress Notes (Addendum)
 10/16/2023 Name: Connor Becker. MRN: [id number] DOB: [birth year]  Chief Complaint  Patient presents with   Medication Management    Diabetes     Connor Becker. is a 73 y.o. year old male who presented for a telephone visit.   They were referred to the pharmacist by their PCP for assistance in managing diabetes.    Subjective:  Care Team: Primary Care Provider: [name], MD ; Next Scheduled Visit: 12/18/2023  Medication Access/Adherence  Current Pharmacy:  [facility] [address], [place] - [address] [place] [address] Phone: [phone] Fax: [phone]   Patient reports affordability concerns with their medications: Yes --Mounjaro Patient reports access/transportation concerns to their pharmacy: No  Patient reports adherence concerns with their medications:  No      Diabetes:  Current medications:  Mounjaro 5mg  weekly  Patient admitted to not really taking his medications a few months back.  We got him linked to the [employer] view account and I "snipped" his glucose report:   Current glucose readings:     Patient denies hypoglycemic s/sx including  dizziness, shakiness, sweating. Patient denies hyperglycemic symptoms including  polyuria, polydipsia, polyphagia, nocturia, neuropathy, blurred vision.  Hypertension:  Current medications:   Lisinopril 5 mg 1 tablet daily   Hyperlipidemia/ASCVD Risk Reduction  Current lipid lowering medications:  Rosuvastatin 40 mg 1 tablet daily  Objective:  Lab Results  Component Value Date   HGBA1C 9.8 (H) 10/03/2023    Lab Results  Component Value Date   CREATININE 1.27 10/03/2023   BUN 15 10/03/2023   NA 140 10/03/2023   K 4.7 10/03/2023   CL 97 10/03/2023   CO2 25 10/03/2023    Lab Results  Component Value Date   CHOL 102 05/08/2023   HDL 37 (L) 05/08/2023   LDLCALC 44 05/08/2023   LDLDIRECT 148.0 11/17/2015   TRIG 114 05/08/2023    CHOLHDL 2.8 05/08/2023    Medications Reviewed Today     Reviewed by Beecher Mcardle, RPH (Pharmacist) on 10/16/23 at 1126  Med List Status: <None>   Medication Order Taking? Sig Documenting Provider Last Dose Status Informant  albuterol (VENTOLIN HFA) 108 (90 Base) MCG/ACT inhaler [id number]  Inhale 2 puffs into the lungs every 6 (six) hours as needed for wheezing or shortness of breath.  Patient not taking: Reported on 08/13/2023   [name], MD  Active   azelastine (ASTELIN) 0.1 % nasal spray [id number] Yes Place 2 sprays into both nostrils 2 (two) times daily. Use in each nostril as directed [name], MD Taking Active   Continuous Blood Gluc Receiver (FREESTYLE LIBRE 2 READER) DEVI [id number] Yes 1 Device by Does not apply route as directed. Shamleffer, Konrad Dolores, MD Taking Active   Continuous Glucose Sensor (FREESTYLE LIBRE 3 SENSOR) [place] [id number] Yes 1 Device by Does not apply route every 14 (fourteen) days. Place 1 sensor on the skin every 14 days. Use to check glucose continuously Shamleffer, Konrad Dolores, MD Taking Active   EPINEPHrine 0.3 mg/0.3 mL IJ SOAJ injection [id number] Yes Inject 0.3 mg into the muscle as needed for anaphylaxis. Charlesetta Ivory, NP Taking Active            Med Note (DAVIS, SOPHIA A   Wed Apr 19, 2021  1:53 PM) On hand  ezetimibe (ZETIA) 10 MG tablet [id number] Yes Take 1 tablet by mouth once daily [name], MD Taking Active   fluticasone [facility]) 50  MCG/ACT nasal spray [id number] Yes Place 2 sprays into both nostrils daily. Joycelyn Man M, PA-C Taking Active   latanoprost (XALATAN) 0.005 % ophthalmic solution [id number] Yes Place 1 drop into both eyes daily. [provider] Taking Active Spouse/Significant Other  levothyroxine (SYNTHROID) 112 MCG tablet [id number] Yes Take 1 tablet (112 mcg total) by mouth daily. [name], MD Taking Active   lisinopril (ZESTRIL) 5 MG tablet [id number] Yes Take 1 tablet (5 mg total) by  mouth daily. [name], MD Taking Active   [facility] VERIO test strip [id number] Yes USE  STRIP TO CHECK GLUCOSE 4 TIMES DAILY [name], MD Taking Active   rosuvastatin (CRESTOR) 40 MG tablet [id number] Yes Take 1 tablet (40 mg total) by mouth daily. Shamleffer, Konrad Dolores, MD Taking Active   tadalafil (CIALIS) 5 MG tablet [id number] Yes Take 5 mg by mouth daily. [provider] Taking Active Spouse/Significant Other           Med Note Janeann Merl A   Sat Jul 09, 2020 10:53 PM) Uses PRN  Testosterone 1.62 % GEL [id number] Yes Apply 2 pumps daily [provider] Taking Active   timolol (TIMOPTIC) 0.5 % ophthalmic solution [id number] Yes Place 1 drop into both eyes daily. [provider] Taking Active Spouse/Significant Other  tirzepatide [facility]) 5 MG/0.5ML Pen [id number] Yes Inject 5 mg into the skin once a week. [name], MD Taking Active   Vitamin D, Ergocalciferol, (DRISDOL) 1.25 MG (50000 UNIT) CAPS capsule [id number]  Take 1 capsule (50,000 Units total) by mouth See admin instructions. Take one capsule by mouth on Tues/Fridays [name], MD  Active               Assessment/Plan:   Diabetes: - Currently uncontrolled A1c 9.8% GMI for the last 14 days on Freestyle is much lower at 6.7% Patient recent started on Mounjaro reported having a lot of side effects with ozempic - Reviewed long term cardiovascular and renal outcomes of uncontrolled blood sugar - Reviewed goal A1c, goal fasting, and goal 2 hour post prandial glucose - Recommend to continue current therapy    Patient's wife mentioned that someone at our office did something for them last year that got a discount on [place].  I called [facility] to see if there was a difference in price if they used the mail order to get their CGM supplies.  The representative said the patient would be responsible for 20% of the cost of the medication at any pharmacy (mail order or other  wise).     Follow Up Plan:    Follow up with patient in 2 weeks to see how blood sugars are trending.  Beecher Mcardle, PharmD, BCACP Clinical Pharmacist [phone]

## 2023-10-18 ENCOUNTER — Ambulatory Visit: Payer: Medicare Other

## 2023-10-18 ENCOUNTER — Telehealth: Payer: Self-pay

## 2023-10-18 DIAGNOSIS — Z0000 Encounter for general adult medical examination without abnormal findings: Secondary | ICD-10-CM | POA: Diagnosis not present

## 2023-10-18 NOTE — Progress Notes (Signed)
 Subjective:   Connor Becker. is a 73 y.o. male who presents for Medicare Annual/Subsequent preventive examination.  Visit Complete: Virtual I connected with  Connor Becker. on 10/18/23 by a audio enabled telemedicine application and verified that I am speaking with the correct person using two identifiers.  Patient Location: Other:  work  Restaurant manager, fast food Location: Office/Clinic  I discussed the limitations of evaluation and management by telemedicine. The patient expressed understanding and agreed to proceed.  Vital Signs: Because this visit was a virtual/telehealth visit, some criteria may be missing or patient reported. Any vitals not documented were not able to be obtained and vitals that have been documented are patient reported.  Patient Medicare AWV questionnaire was completed by the patient on 10/18/2023; I have confirmed that all information answered by patient is correct and no changes since this date.        Objective:    There were no vitals filed for this visit. There is no height or weight on file to calculate BMI.     11/08/2022    2:09 PM 07/12/2022    2:17 PM 11/01/2021    3:41 PM 03/18/2021    7:50 AM 03/16/2021    5:01 PM 10/19/2020    9:55 AM 09/23/2020    4:28 PM  Advanced Directives  Does Patient Have a Medical Advance Directive? No No No No No No No  Would patient like information on creating a medical advance directive? No - Patient declined Yes (MAU/Ambulatory/Procedural Areas - Information given) No - Patient declined No - Patient declined       Current Medications (verified) Outpatient Encounter Medications as of 10/18/2023  Medication Sig   albuterol (VENTOLIN HFA) 108 (90 Base) MCG/ACT inhaler Inhale 2 puffs into the lungs every 6 (six) hours as needed for wheezing or shortness of breath. (Patient not taking: Reported on 08/13/2023)   azelastine (ASTELIN) 0.1 % nasal spray Place 2 sprays into both nostrils 2 (two) times daily. Use in each nostril as  directed   Continuous Blood Gluc Receiver (FREESTYLE LIBRE 2 READER) DEVI 1 Device by Does not apply route as directed.   Continuous Glucose Sensor (FREESTYLE LIBRE 3 SENSOR) MISC 1 Device by Does not apply route every 14 (fourteen) days. Place 1 sensor on the skin every 14 days. Use to check glucose continuously   EPINEPHrine 0.3 mg/0.3 mL IJ SOAJ injection Inject 0.3 mg into the muscle as needed for anaphylaxis.   ezetimibe (ZETIA) 10 MG tablet Take 1 tablet by mouth once daily   fluticasone (FLONASE) 50 MCG/ACT nasal spray Place 2 sprays into both nostrils daily.   latanoprost (XALATAN) 0.005 % ophthalmic solution Place 1 drop into both eyes daily.   levothyroxine (SYNTHROID) 112 MCG tablet Take 1 tablet (112 mcg total) by mouth daily.   lisinopril (ZESTRIL) 5 MG tablet Take 1 tablet (5 mg total) by mouth daily.   ONETOUCH VERIO test strip USE  STRIP TO CHECK GLUCOSE 4 TIMES DAILY   rosuvastatin (CRESTOR) 40 MG tablet Take 1 tablet (40 mg total) by mouth daily.   tadalafil (CIALIS) 5 MG tablet Take 5 mg by mouth daily.   Testosterone 1.62 % GEL Apply 2 pumps daily   timolol (TIMOPTIC) 0.5 % ophthalmic solution Place 1 drop into both eyes daily.   tirzepatide [facility] Novamed) 5 MG/0.5ML Pen Inject 5 mg into the skin once a week.   Vitamin D, Ergocalciferol, (DRISDOL) 1.25 MG (50000 UNIT) CAPS capsule Take 1 capsule (50,000 Units  total) by mouth See admin instructions. Take one capsule by mouth on Tues/Fridays   No facility-administered encounter medications on file as of 10/18/2023.    Allergies (verified) Horseradish [armoracia rusticana ext (horseradish)], Lipitor [atorvastatin], and Metformin and related   History: Past Medical History:  Diagnosis Date   ALLERGIC RHINITIS    Chronic pain of both knees    DJD (degenerative joint disease) of knee    right   DM2 (diabetes mellitus, type 2) (HCC)    GERD    Glaucoma    History of nuclear stress test    Myoview 12/17: EF 53, no ischemia,  Low Risk   HYPERLIPIDEMIA    myalgias from Lipitor   HYPOGONADISM, MALE    Hypothyroidism    INSOMNIA-SLEEP DISORDER-UNSPEC    Joint pain    Obesity    OSTEOARTHRITIS    SLEEP APNEA, OBSTRUCTIVE    no CPAP; repeat sleep test pending 07/2016   Unspecified hypothyroidism 12/04/2013   Vitamin D deficiency    Past Surgical History:  Procedure Laterality Date   NASAL SEPTUM SURGERY     TONSILLECTOMY     UVULOPALATOPHARYNGOPLASTY     VASECTOMY     Family History  Problem Relation Age of Onset   Heart attack Mother 81       s/p MI   Other Mother        pacemaker   Diabetes Mother    High blood pressure Mother    High Cholesterol Mother    Thyroid disease Mother    Obesity Mother    Macular degeneration Father    Thyroid disease Father    Diabetes Maternal Uncle    Heart attack Maternal Uncle    Hypertension Other    Prostate cancer Other    Heart disease Other    Colon cancer Neg Hx    Social History   Socioeconomic History   Marital status: Married    Spouse name: Connor Becker   Number of children: 2   Years of education: Not on file   Highest education level: Bachelor's degree (e.g., BA, AB, BS)  Occupational History   Occupation: retired  Tobacco Use   Smoking status: Never   Smokeless tobacco: Never  Vaping Use   Vaping status: Never Used  Substance and Sexual Activity   Alcohol use: Not Currently    Comment: occassional beer   Drug use: No   Sexual activity: Yes  Other Topics Concern   Not on file  Social History Narrative   Married    2 children   Semi-retired   Holiday representative; gen Surveyor, minerals   Previous Microbiologist at [employer]   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   Social Drivers of Corporate investment banker Strain: Low Risk  (05/06/2023)   Overall Financial Resource Strain (CARDIA)    Difficulty of Paying Living Expenses: Not hard at all  Food Insecurity: No Food Insecurity (09/09/2023)   Hunger Vital Sign    Worried About Running Out of Food in  the Last Year: Never true    Ran Out of Food in the Last Year: Never true  Transportation Needs: No Transportation Needs (09/09/2023)   PRAPARE - Administrator, Civil Service (Medical): No    Lack of Transportation (Non-Medical): No  Physical Activity: Insufficiently Active (05/06/2023)   Exercise Vital Sign    Days of Exercise per Week: 5 days    Minutes of Exercise per Session: 10 min  Stress: No Stress Concern Present (  01/31/2023)   [place] [employer] - Occupational Stress Questionnaire    Feeling of Stress : Not at all  Social Connections: Unknown (05/06/2023)   Social Connection and Isolation Panel [NHANES]    Frequency of Communication with Friends and Family: More than three times a week    Frequency of Social Gatherings with Friends and Family: Twice a week    Attends Religious Services: More than 4 times per year    Active Member of [employer] or Organizations: Patient declined    Attends Banker Meetings: Not on file    Marital Status: Married    Tobacco Counseling Counseling given: Not Answered   Clinical Intake:                        Activities of Daily Living    11/08/2022    2:10 PM  In your present state of health, do you have any difficulty performing the following activities:  Hearing? 1  Comment has buzzing in ears  Vision? 1  Comment has some glare  Difficulty concentrating or making decisions? 0  Walking or climbing stairs? 0  Dressing or bathing? 0  Doing errands, shopping? 0  Preparing Food and eating ? N  Using the Toilet? N  In the past six months, have you accidently leaked urine? Y  Do you have problems with loss of bowel control? N  Managing your Medications? N  Managing your Finances? N  Housekeeping or managing your Housekeeping? N    Patient Care Team: [name], MD as PCP - General (Internal Medicine)  Indicate any recent Medical Services you may have received from other  than [facility] providers in the past year (date may be approximate).     Assessment:   This is a routine wellness examination for Z[place]uri.  Hearing/Vision screen No results found.   Goals Addressed   None    Depression Screen    10/03/2023    8:35 AM 05/08/2023    9:52 AM 01/31/2023    9:08 AM 11/08/2022    2:09 PM 08/02/2022   10:55 AM 07/12/2022    2:27 PM 11/01/2021    3:42 PM  PHQ 2/9 Scores  PHQ - 2 Score 0 0 0 0 0 0 0  PHQ- 9 Score 0 0 0        Fall Risk    10/03/2023    8:35 AM 05/08/2023    9:52 AM 01/31/2023    9:08 AM 11/08/2022    2:09 PM 08/02/2022   10:55 AM  Fall Risk   Falls in the past year? 0 0 0 0 0  Number falls in past yr: 0 0 0 0 0  Injury with Fall? 0 0 0 0 0  Risk for fall due to : No Fall Risks No Fall Risks No Fall Risks Medication side effect No Fall Risks  Follow up Falls evaluation completed Falls evaluation completed Falls evaluation completed Falls prevention discussed;Education provided;Falls evaluation completed Falls evaluation completed    MEDICARE RISK AT HOME:    TIMED UP AND GO:  Was the test performed?  No    Cognitive Function:        11/08/2022    2:11 PM 11/01/2021    3:43 PM 10/19/2020    9:58 AM 10/07/2019    3:14 PM 03/31/2019    9:53 AM  6CIT Screen  What Year? 0 points 0 points 0 points 0 points 0 points  What month? 0 points 0 points 0 points 0 points 0 points  What time? 0 points 0 points 0 points 0 points 0 points  Count back from 20 0 points 0 points 0 points 0 points 0 points  Months in reverse 0 points 0 points 0 points 0 points 0 points  Repeat phrase 0 points 0 points 0 points 0 points 0 points  Total Score 0 points 0 points 0 points 0 points 0 points    Immunizations Immunization History  Administered Date(s) Administered   Tdap 10/31/2010    TDAP status: Up to date  Flu Vaccine status: Up to date  Pneumococcal vaccine status: Declined,  Education has been provided regarding the importance of this vaccine  but patient still declined. Advised may receive this vaccine at local pharmacy or Health Dept. Aware to provide a copy of the vaccination record if obtained from local pharmacy or Health Dept. Verbalized acceptance and understanding.   Covid-19 vaccine status: Declined, Education has been provided regarding the importance of this vaccine but patient still declined. Advised may receive this vaccine at local pharmacy or Health Dept.or vaccine clinic. Aware to provide a copy of the vaccination record if obtained from local pharmacy or Health Dept. Verbalized acceptance and understanding.  Qualifies for Shingles Vaccine? Yes   Zostavax completed No   Shingrix Completed?: No.    Education has been provided regarding the importance of this vaccine. Patient has been advised to call insurance company to determine out of pocket expense if they have not yet received this vaccine. Advised may also receive vaccine at local pharmacy or Health Dept. Verbalized acceptance and understanding.  Screening Tests Health Maintenance  Topic Date Due   COVID-19 Vaccine (1) Never done   FOOT EXAM  08/03/2023   Medicare Annual Wellness (AWV)  11/08/2023   Zoster Vaccines- Shingrix (1 of 2) 01/03/2024 (Originally 01/27/1970)   Pneumonia Vaccine 69+ Years old (1 of 2 - PCV) 10/02/2024 (Originally 01/27/1957)   OPHTHALMOLOGY EXAM  10/24/2023   INFLUENZA VACCINE  02/14/2024   HEMOGLOBIN A1C  04/04/2024   Diabetic kidney evaluation - eGFR measurement  10/02/2024   Diabetic kidney evaluation - Urine ACR  10/02/2024   Hepatitis C Screening  Completed   HPV VACCINES  Aged Out   DTaP/Tdap/Td  Discontinued   Colonoscopy  Discontinued    Health Maintenance  Health Maintenance Due  Topic Date Due   COVID-19 Vaccine (1) Never done   FOOT EXAM  08/03/2023   Medicare Annual Wellness (AWV)  11/08/2023    Colorectal cancer screening: Type of screening: Colonoscopy. Completed 05/03/2021. Repeat every 10 years  Lung Cancer  Screening: (Low Dose CT Chest recommended if Age 81-80 years, 20 pack-year currently smoking OR have quit w/in 15years.) does not qualify.   Lung Cancer Screening Referral: n/a  Additional Screening:  Hepatitis C Screening: does qualify; Completed 11/17/2015  Vision Screening: Recommended annual ophthalmology exams for early detection of glaucoma and other disorders of the eye. Is the patient up to date with their annual eye exam?  Yes  Who is the provider or what is the name of the office in which the patient attends annual eye exams? Dr.Parker If pt is not established with a provider, would they like to be referred to a provider to establish care?  Already established .   Dental Screening: Recommended annual dental exams for proper oral hygiene  Diabetic Foot Exam: Diabetic Foot Exam: Overdue, Pt has been advised about the importance in completing  this exam. Pt is scheduled for diabetic foot exam on 12/19/2023.  Community Resource Referral / Chronic Care Management: CRR required this visit?  No   CCM required this visit?  No     Plan:     I have personally reviewed and noted the following in the patient's chart:   Medical and social history Use of alcohol, tobacco or illicit drugs  Current medications and supplements including opioid prescriptions. Patient is not currently taking opioid prescriptions. Functional ability and status Nutritional status Physical activity Advanced directives List of other physicians Hospitalizations, surgeries, and ER visits in previous 12 months Vitals Screenings to include cognitive, depression, and falls Referrals and appointments  In addition, I have reviewed and discussed with patient certain preventive protocols, quality metrics, and best practice recommendations. A written personalized care plan for preventive services as well as general preventive health recommendations were provided to patient.     Marlyn Corporal, CMA   10/18/2023    After Visit Summary: (MyChart) Due to this being a telephonic visit, the after visit summary with patients personalized plan was offered to patient via MyChart   Nurse Notes:

## 2023-10-18 NOTE — Telephone Encounter (Signed)
 9:12am first attempt to call patient for AWV.

## 2023-10-26 ENCOUNTER — Other Ambulatory Visit: Payer: Self-pay | Admitting: Internal Medicine

## 2023-10-26 DIAGNOSIS — E1169 Type 2 diabetes mellitus with other specified complication: Secondary | ICD-10-CM

## 2023-10-29 ENCOUNTER — Telehealth: Payer: Self-pay | Admitting: Pharmacist

## 2023-10-29 DIAGNOSIS — E119 Type 2 diabetes mellitus without complications: Secondary | ICD-10-CM

## 2023-10-29 NOTE — Progress Notes (Signed)
 10/29/2023 Name: Connor Becker. MRN: [id number] DOB: [birth year]  Chief Complaint  Patient presents with   Medication Management    Diabetes     Connor Becker. is a 73 y.o. year old male who presented for a telephone visit.   They were referred to the pharmacist by their PCP for assistance in managing diabetes.    Subjective:  Care Team: Primary Care Provider: [name], Becker ; Next Scheduled Visit: 12/18/23 Endocrinologist Dr. Lonzo Becker; Next Scheduled Visit: 11/13/2023  Medication Access/Adherence  Current Becker:  Connor Becker [address] [place] - [address] [place] [address] Phone: [phone] Fax: [phone]   Patient reports affordability concerns with their medications: No  Patient reports access/transportation concerns to their Becker: No  Patient reports adherence concerns with their medications:  No      Diabetes:  Current medications:  Mounjaro 5 mg weekly (Glimepiride 4 mg was filled 10/21/23 by Connor Becker but Patient said he is not taking it.  He was advised to let Connor know he is no longer taking as it was on automatic refill.  Current glucose readings:     Patient denies hypoglycemic s/sx including  dizziness, shakiness, sweating. Patient denies hyperglycemic symptoms including  polyuria, polydipsia, polyphagia, nocturia, neuropathy, blurred vision.     Objective:  Lab Results  Component Value Date   HGBA1C 9.8 (H) 10/03/2023    Lab Results  Component Value Date   CREATININE 1.27 10/03/2023   BUN 15 10/03/2023   NA 140 10/03/2023   K 4.7 10/03/2023   CL 97 10/03/2023   CO2 25 10/03/2023    Lab Results  Component Value Date   CHOL 102 05/08/2023   HDL 37 (L) 05/08/2023   LDLCALC 44 05/08/2023   LDLDIRECT 148.0 11/17/2015   TRIG 114 05/08/2023   CHOLHDL 2.8 05/08/2023    Medications Reviewed Today     Reviewed by Connor Becker (Pharmacist) on 10/29/23  at 1302  Med List Status: <None>   Medication Order Taking? Sig Documenting Provider Last Dose Status Informant  albuterol (VENTOLIN HFA) 108 (90 Base) MCG/ACT inhaler [id number] No Inhale 2 puffs into the lungs every 6 (six) hours as needed for wheezing or shortness of breath.  Patient not taking: Reported on 08/13/2023   Connor Peng, Becker Not Taking Active   azelastine (ASTELIN) 0.1 % nasal spray [id number] Yes Place 2 sprays into both nostrils 2 (two) times daily. Use in each nostril as directed Connor Peng, Becker Taking Active   Continuous Blood Gluc Receiver (FREESTYLE LIBRE 2 READER) DEVI [id number] Yes 1 Device by Does not apply route as directed. Connor Becker, Connor Dolores, Becker Taking Active   Continuous Glucose Sensor (FREESTYLE LIBRE 3 SENSOR) [place] [id number] Yes 1 Device by Does not apply route every 14 (fourteen) days. Place 1 sensor on the skin every 14 days. Use to check glucose continuously Connor Becker, Connor Dolores, Becker Taking Active   EPINEPHrine 0.3 mg/0.3 mL IJ SOAJ injection [id number] Yes Inject 0.3 mg into the muscle as needed for anaphylaxis. Connor Becker Taking Active            Med Note (Becker, Connor A   Wed Apr 19, 2021  1:53 PM) On hand  ezetimibe (ZETIA) 10 MG tablet [id number] Yes Take 1 tablet by mouth once daily Connor Peng, Becker Taking Active   fluticasone [facility]) 50 MCG/ACT nasal spray [id number] Yes Place 2 sprays into both nostrils daily. Connor Becker Taking Active   latanoprost (XALATAN) 0.005 % ophthalmic solution [id number] Yes Place 1 drop into both eyes daily. Provider, Historical, Becker Taking Active Spouse/Significant Other  levothyroxine (SYNTHROID) 112 MCG tablet [id number] Yes Take 1 tablet (112 mcg total) by mouth daily. Connor Curling, Becker Taking Active   lisinopril (ZESTRIL) 5 MG tablet [id number] Yes Take 1 tablet (5 mg total) by mouth daily. Connor Curling, Becker Taking Active   MOUNJARO 5 MG/0.5ML Pen [id number] Yes INJECT 5 MG  SUBCUTANEOUSLY  ONCE A WEEK Connor Becker Taking Active   [facility] VERIO test strip [id number] Yes USE  STRIP TO CHECK GLUCOSE 4 TIMES DAILY Connor Curling, Becker Taking Active   rosuvastatin (CRESTOR) 40 MG tablet [id number] Yes Take 1 tablet (40 mg total) by mouth daily. Connor Becker, Connor Obey, Becker Taking Active   tadalafil (CIALIS) 5 MG tablet [id number] Yes Take 5 mg by mouth daily. Provider, Historical, Becker Taking Active Spouse/Significant Other           Med Note Loura Rower A   Sat Jul 09, 2020 10:53 PM) Uses PRN  Testosterone 1.62 % GEL [id number] Yes Apply 2 pumps daily Provider, Historical, Becker Taking Active   timolol (TIMOPTIC) 0.5 % ophthalmic solution [id number] Yes Place 1 drop into both eyes daily. Provider, Historical, Becker Taking Active Spouse/Significant Other  Vitamin D, Ergocalciferol, (DRISDOL) 1.25 MG (50000 UNIT) CAPS capsule [id number] Yes Take 1 capsule (50,000 Units total) by mouth See admin instructions. Take one capsule by mouth on Tues/Fridays Connor Curling, Becker Taking Active               Assessment/Plan:   Diabetes: - Currently uncontrolled GMI per Freestyle Libreview 6.4% (last 14 days) avg blood sugar last 30 days 140mg /dl - Recommend to continue current therapy  - Patient denies personal or family history of multiple endocrine neoplasia type 2, medullary thyroid cancer; personal history of pancreatitis or gallbladder disease. -On statin therapy.   Follow Up Plan:    Follow up with Patient after his upcoming PCP appointment 12/18/23   Connor Becker, PharmD, BCACP Clinical Pharmacist [phone]

## 2023-10-31 ENCOUNTER — Encounter: Payer: Medicare Other | Admitting: Internal Medicine

## 2023-11-05 ENCOUNTER — Telehealth: Payer: Self-pay

## 2023-11-05 NOTE — Patient Outreach (Signed)
 Complex Care Management Care Guide Note  11/05/2023 Name: Connor Becker. MRN: [id number] DOB: [birth year]  Connor Becker. is a 73 y.o. year old male who is a primary care patient of Cleave Curling, MD and is actively engaged with the care management team. I reached out to Connor Becker. by phone today to assist with scheduling  with the RN Case Manager.  Follow up plan: Unsuccessful telephone outreach attempt made. A HIPAA compliant phone message was left for the patient providing contact information and requesting a return call.  Carter Clare, BSW, CDP [place]  [facility] - [facility] Manager Population Health Direct Dial: [phone]  Fax: [phone]

## 2023-11-13 ENCOUNTER — Encounter: Payer: Self-pay | Admitting: Internal Medicine

## 2023-11-13 ENCOUNTER — Other Ambulatory Visit: Payer: Self-pay | Admitting: Internal Medicine

## 2023-11-13 ENCOUNTER — Ambulatory Visit: Payer: Medicare Other | Admitting: Internal Medicine

## 2023-11-13 VITALS — BP 122/74 | HR 78 | Ht 73.0 in | Wt 302.0 lb

## 2023-11-13 DIAGNOSIS — E1165 Type 2 diabetes mellitus with hyperglycemia: Secondary | ICD-10-CM

## 2023-11-13 DIAGNOSIS — E785 Hyperlipidemia, unspecified: Secondary | ICD-10-CM | POA: Diagnosis not present

## 2023-11-13 DIAGNOSIS — Z7985 Long-term (current) use of injectable non-insulin antidiabetic drugs: Secondary | ICD-10-CM

## 2023-11-13 DIAGNOSIS — E1169 Type 2 diabetes mellitus with other specified complication: Secondary | ICD-10-CM

## 2023-11-13 DIAGNOSIS — E1142 Type 2 diabetes mellitus with diabetic polyneuropathy: Secondary | ICD-10-CM | POA: Diagnosis not present

## 2023-11-13 NOTE — Patient Instructions (Signed)
Continue Mounjaro 5 mg weekly    HOW TO TREAT LOW BLOOD SUGARS (Blood sugar LESS THAN 70 MG/DL) Please follow the RULE OF 15 for the treatment of hypoglycemia treatment (when your (blood sugars are less than 70 mg/dL)   STEP 1: Take 15 grams of carbohydrates when your blood sugar is low, which includes:  3-4 GLUCOSE TABS  OR 3-4 OZ OF JUICE OR REGULAR SODA OR ONE TUBE OF GLUCOSE GEL    STEP 2: RECHECK blood sugar in 15 MINUTES STEP 3: If your blood sugar is still low at the 15 minute recheck --> then, go back to STEP 1 and treat AGAIN with another 15 grams of carbohydrates.

## 2023-11-13 NOTE — Progress Notes (Signed)
 .   Name: Connor Becker.  Age/ Sex: 73 y.o., male   MRN/ DOB: [id number], [birth year]     PCP: Cleave Curling, MD   Reason for Endocrinology Evaluation: Type 2 Diabetes Mellitus  Initial Endocrine Consultative Visit: 04/13/2020    PATIENT IDENTIFIER: Connor Becker. is a 73 y.o. male with a past medical history of T2DM,OSA, hypogonadism and dyslipidemia. The patient has followed with Endocrinology clinic since 04/13/2020 for consultative assistance with management of his diabetes.     DIABETIC HISTORY:  Connor Becker was diagnosed with DM2 in 2014,Metformin - GI side effect, Farxiga  -severe urinary frequency preventing him from sleeping at night.  His hemoglobin A1c has ranged from 6.6%  in 2020, peaking at 7.7% in 2021.  On his initial visit to our clinic his A1c was 7.7%  ,  He was not on any glycemic medications. Started Rybelsus   Rybelsus  stopped by 06/2021 due to nausea and vomiting and started pioglitazone  but he did not start it  By 10/2021 he started using his wife's Ozempic  and tolerated well  He self discontinued glipizide  by August 2023-no reported intolerance just due to fear of hypoglycemia   By his visit in 07/2023 the patient had already discontinued Ozempic  due to severe GI side effects and we started him on glimepiride   By his visit with me in April, 2025 his PCP has started him on Mounjaro and he discontinued glimepiride    SUBJECTIVE:   During the last visit (08/13/2023): A1c 9.6 %     Today (11/13/2023): Connor Becker is here for a follow up on diabetes.  He checks his blood sugars multiple times a day through CGM. The patient has not had hypoglycemic episodes since the last clinic visit.  He was started on Mounjaro through PCP , he endorses bloating and gerd issues a few days after the mounjaro No nausea or vomiting  No  diarrhea but has occasional constipation   HOME DIABETES REGIMEN:  Mounjaro 5 mg weekly  Rosuvastatin  40 mg  daily      Statin: yes ACE-I/ARB: yes   CONTINUOUS GLUCOSE MONITORING RECORD INTERPRETATION    Dates of Recording: 4/17-4/30/2025  Sensor description:freestyle libre   Results statistics:   CGM use % of time 92  Average and SD 132/18.3  Time in range 97%  % Time Above 180 3  % Time above 250 0  % Time Below target 0      Glycemic patterns summary: BG's have been optimal throughout the day and the night  Hypoglycemic episodes occurred N.A  Overnight periods: High     DIABETIC COMPLICATIONS: Microvascular complications:   Denies: CKD, retinopathy, neuropathy Last Eye Exam: Completed 10/24/2022  Macrovascular complications:   Denies: CAD, CVA, PVD   HISTORY:  Past Medical History:  Past Medical History:  Diagnosis Date   ALLERGIC RHINITIS    Chronic pain of both knees    DJD (degenerative joint disease) of knee    right   DM2 (diabetes mellitus, type 2) (HCC)    GERD    Glaucoma    History of nuclear stress test    Myoview  12/17: EF 53, no ischemia, Low Risk   HYPERLIPIDEMIA    myalgias from Lipitor   HYPOGONADISM, MALE    Hypothyroidism    INSOMNIA-SLEEP DISORDER-UNSPEC    Joint pain    Obesity    OSTEOARTHRITIS    SLEEP APNEA, OBSTRUCTIVE    no CPAP; repeat sleep test pending 07/2016   Unspecified hypothyroidism 12/04/2013  Vitamin D  deficiency    Past Surgical History:  Past Surgical History:  Procedure Laterality Date   NASAL SEPTUM SURGERY     TONSILLECTOMY     UVULOPALATOPHARYNGOPLASTY     VASECTOMY     Social History:  reports that he has never smoked. He has never used smokeless tobacco. He reports that he does not currently use alcohol. He reports that he does not use drugs. Family History:  Family History  Problem Relation Age of Onset   Heart attack Mother 73       s/p MI   Other Mother        pacemaker   Diabetes Mother    High blood pressure Mother    High Cholesterol Mother    Thyroid  disease Mother    Obesity  Mother    Macular degeneration Father    Thyroid  disease Father    Diabetes Maternal Uncle    Heart attack Maternal Uncle    Hypertension Other    Prostate cancer Other    Heart disease Other    Colon cancer Neg Hx      HOME MEDICATIONS: Allergies as of 11/13/2023       Reactions   Horseradish [armoracia Rusticana Ext (horseradish)]    Lipitor [atorvastatin ] Other (See Comments)   Leg cramps   Metformin  And Related Other (See Comments)   GI upset        Medication List        Accurate as of November 13, 2023  9:35 AM. If you have any questions, ask your nurse or doctor.          albuterol  108 (90 Base) MCG/ACT inhaler Commonly known as: VENTOLIN  HFA Inhale 2 puffs into the lungs every 6 (six) hours as needed for wheezing or shortness of breath.   azelastine  0.1 % nasal spray Commonly known as: ASTELIN  Place 2 sprays into both nostrils 2 (two) times daily. Use in each nostril as directed   EPINEPHrine  0.3 mg/0.3 mL Soaj injection Commonly known as: EPI-PEN Inject 0.3 mg into the muscle as needed for anaphylaxis.   ezetimibe  10 MG tablet Commonly known as: ZETIA  Take 1 tablet by mouth once daily   fluticasone  50 MCG/ACT nasal spray Commonly known as: FLONASE  Place 2 sprays into both nostrils daily.   FreeStyle Libre 2 Reader Devi 1 Device by Does not apply route as directed.   FreeStyle Libre 3 Sensor Misc 1 Device by Does not apply route every 14 (fourteen) days. Place 1 sensor on the skin every 14 days. Use to check glucose continuously   latanoprost 0.005 % ophthalmic solution Commonly known as: XALATAN Place 1 drop into both eyes daily.   levothyroxine  112 MCG tablet Commonly known as: SYNTHROID  Take 1 tablet (112 mcg total) by mouth daily.   lisinopril  5 MG tablet Commonly known as: ZESTRIL  Take 1 tablet (5 mg total) by mouth daily.   Mounjaro 5 MG/0.5ML Pen Generic drug: tirzepatide INJECT 5 MG SUBCUTANEOUSLY  ONCE A WEEK   OneTouch Verio  test strip Generic drug: glucose blood USE  STRIP TO CHECK GLUCOSE 4 TIMES DAILY   rosuvastatin  40 MG tablet Commonly known as: CRESTOR  Take 1 tablet (40 mg total) by mouth daily.   tadalafil  5 MG tablet Commonly known as: CIALIS  Take 5 mg by mouth daily.   Testosterone  1.62 % Gel Apply 2 pumps daily   timolol  0.5 % ophthalmic solution Commonly known as: TIMOPTIC  Place 1 drop into both eyes daily.  Vitamin D  (Ergocalciferol ) 1.25 MG (50000 UNIT) Caps capsule Commonly known as: DRISDOL  Take 1 capsule (50,000 Units total) by mouth See admin instructions. Take one capsule by mouth on Tues/Fridays         OBJECTIVE:   Vital Signs: BP 122/74 (BP Location: Right Arm, Patient Position: Sitting, Cuff Size: Normal)   Pulse 78   Ht 6\' 1"  (1.854 m)   Wt (!) 302 lb (137 kg)   SpO2 98%   BMI 39.84 kg/m   Wt Readings from Last 3 Encounters:  11/13/23 (!) 302 lb (137 kg)  10/03/23 (!) 300 lb 12.8 oz (136.4 kg)  08/13/23 300 lb (136.1 kg)     Exam: General: Pt appears well and is in NAD  Lungs: Clear with good BS bilat   Heart: RRR   Extremities: Trace pretibial edema.   Neuro: MS is good with appropriate affect, pt is alert and Ox3   DM foot exam: 08/13/2023   The skin of the feet is intact without sores or ulcerations. The pedal pulses are 1+ on right and 1+ on left. The sensation is decreased to a screening 5.07, 10 gram monofilament bilaterally     DATA REVIEWED:  Lab Results  Component Value Date   HGBA1C 9.8 (H) 10/03/2023   HGBA1C 9.6 (A) 08/13/2023   HGBA1C 7.7 (H) 05/08/2023    Latest Reference Range & Units 10/03/23 09:20  Sodium 134 - 144 mmol/L 140  Potassium 3.5 - 5.2 mmol/L 4.7  Chloride 96 - 106 mmol/L 97  CO2 20 - 29 mmol/L 25  Glucose 70 - 99 mg/dL 147 (H)  BUN 8 - 27 mg/dL 15  Creatinine 8.29 - 5.62 mg/dL 1.30  Calcium  8.6 - 10.2 mg/dL 9.8  BUN/Creatinine Ratio 10 - 24  12  eGFR >59 mL/min/1.73 59 (L)  Alkaline Phosphatase 44 - 121  IU/L 84  Albumin 3.8 - 4.8 g/dL 4.5  AST 0 - 40 IU/L 29  ALT 0 - 44 IU/L 31  Total Protein 6.0 - 8.5 g/dL 7.4  Total Bilirubin 0.0 - 1.2 mg/dL 0.4    Latest Reference Range & Units 10/03/23 09:58  Microalbumin, Urine Not Estab. ug/mL 62.7  MICROALB/CREAT RATIO 0 - 29 mg/g creat 24  Creatinine, Urine Not Estab. mg/dL 865.7      Old records , labs and images have been reviewed.    ASSESSMENT / PLAN / RECOMMENDATIONS:   1) Type 2 Diabetes Mellitus, Poorly  controlled, With neuropathic complications - Most recent A1c of 9.8% Goal A1c < 7.0 %.    -A1c continues to be elevated but the patient was recently started on Mounjaro through his PCPs office, he does have minor GI side effects, and because of that I have recommended not to increase the dose at this time, his BGs have improved tremendously on CGM download is just too soon to reflect in his A1c -Patient intolerant to Rybelsus  and Ozempic  -He is intolerant to metformin  -He is intolerant to Farxiga  due to frequency impeding his restful sleep at night  MEDICATIONS: - Continue Mounjaro 5 mg weekly     EDUCATION / INSTRUCTIONS: BG monitoring instructions: Patient is instructed to check his blood sugars  3 times a day Call [facility] if: BG persistently < 70  I reviewed the Rule of 15 for the treatment of hypoglycemia in detail with the patient. Literature supplied.    2) Diabetic complications:  Eye: Does not have known diabetic retinopathy.  Neuro/ Feet: Does not have  known diabetic peripheral neuropathy .  Renal: Patient does not have known baseline CKD.      3) Dyslipidemia :   -LDL at goal from 04/2023 - Patient has restarted rosuvastatin , tolerating well   Medication Continue rosuvastatin  40 mg daily  F/U in 3 months     Signed electronically by: Natale Bail, MD  [facility] Endocrinology  [facility] [address] [place], [place] [address] Phone:  [phone] FAX: [phone]   CC: Cleave Curling, MD [address] [place] [address] Phone: [phone]  Fax: [phone]  Return to Endocrinology clinic as below: Future Appointments  Date Time Provider Department Center  12/18/2023  2:40 PM Cleave Curling, MD [facility] None  11/11/2024 11:40 AM [facility]-ANNUAL WELLNESS VISIT [facility] None

## 2023-11-14 ENCOUNTER — Telehealth: Payer: Self-pay | Admitting: *Deleted

## 2023-11-14 NOTE — Progress Notes (Signed)
 Complex Care Management Care Guide Note  11/14/2023 Name: [name]. MRN: [id number] DOB: 1951/06/06  Connor Becker. is a 73 y.o. year old male who is a primary care patient of Cleave Curling, MD and is actively engaged with the care management team. I reached out to Connor Becker. by phone today to assist with re-scheduling  with the RN Case Manager.  Follow up plan: Unsuccessful telephone outreach attempt made. A HIPAA compliant phone message was left for the patient providing contact information and requesting a return call.No further outreach attempts will be made at this time. We have been unable to contact the patient to reschedule for complex care management services.   Barnie Bora  [facility]  [facility], [facility] Psychiatric Guide  Direct Dial: [phone]  Fax [phone]

## 2023-11-15 ENCOUNTER — Other Ambulatory Visit: Payer: Self-pay | Admitting: Internal Medicine

## 2023-11-29 ENCOUNTER — Inpatient Hospital Stay (HOSPITAL_COMMUNITY)
Admission: EM | Admit: 2023-11-29 | Discharge: 2023-12-01 | DRG: 389 | Disposition: A | Discharge status: Home / Self Care | Attending: Family Medicine | Admitting: Family Medicine

## 2023-11-29 ENCOUNTER — Other Ambulatory Visit: Payer: Self-pay

## 2023-11-29 ENCOUNTER — Observation Stay (HOSPITAL_COMMUNITY)

## 2023-11-29 ENCOUNTER — Encounter (HOSPITAL_COMMUNITY): Payer: Self-pay | Admitting: *Deleted

## 2023-11-29 ENCOUNTER — Emergency Department (HOSPITAL_COMMUNITY)

## 2023-11-29 DIAGNOSIS — Z91018 Allergy to other foods: Secondary | ICD-10-CM

## 2023-11-29 DIAGNOSIS — E869 Volume depletion, unspecified: Secondary | ICD-10-CM | POA: Diagnosis not present

## 2023-11-29 DIAGNOSIS — I700 Atherosclerosis of aorta: Secondary | ICD-10-CM | POA: Diagnosis not present

## 2023-11-29 DIAGNOSIS — E1142 Type 2 diabetes mellitus with diabetic polyneuropathy: Secondary | ICD-10-CM | POA: Diagnosis present

## 2023-11-29 DIAGNOSIS — Z7985 Long-term (current) use of injectable non-insulin antidiabetic drugs: Secondary | ICD-10-CM

## 2023-11-29 DIAGNOSIS — E559 Vitamin D deficiency, unspecified: Secondary | ICD-10-CM | POA: Diagnosis not present

## 2023-11-29 DIAGNOSIS — Z888 Allergy status to other drugs, medicaments and biological substances status: Secondary | ICD-10-CM

## 2023-11-29 DIAGNOSIS — Z8249 Family history of ischemic heart disease and other diseases of the circulatory system: Secondary | ICD-10-CM

## 2023-11-29 DIAGNOSIS — Z6838 Body mass index (BMI) 38.0-38.9, adult: Secondary | ICD-10-CM

## 2023-11-29 DIAGNOSIS — R109 Unspecified abdominal pain: Secondary | ICD-10-CM | POA: Diagnosis present

## 2023-11-29 DIAGNOSIS — K567 Ileus, unspecified: Secondary | ICD-10-CM | POA: Diagnosis not present

## 2023-11-29 DIAGNOSIS — E039 Hypothyroidism, unspecified: Secondary | ICD-10-CM | POA: Diagnosis present

## 2023-11-29 DIAGNOSIS — H409 Unspecified glaucoma: Secondary | ICD-10-CM | POA: Diagnosis not present

## 2023-11-29 DIAGNOSIS — G4733 Obstructive sleep apnea (adult) (pediatric): Secondary | ICD-10-CM | POA: Diagnosis not present

## 2023-11-29 DIAGNOSIS — R140 Abdominal distension (gaseous): Secondary | ICD-10-CM | POA: Diagnosis present

## 2023-11-29 DIAGNOSIS — Z79890 Hormone replacement therapy (postmenopausal): Secondary | ICD-10-CM

## 2023-11-29 DIAGNOSIS — R17 Unspecified jaundice: Secondary | ICD-10-CM | POA: Diagnosis present

## 2023-11-29 DIAGNOSIS — K6389 Other specified diseases of intestine: Secondary | ICD-10-CM | POA: Diagnosis not present

## 2023-11-29 DIAGNOSIS — Z833 Family history of diabetes mellitus: Secondary | ICD-10-CM

## 2023-11-29 DIAGNOSIS — R112 Nausea with vomiting, unspecified: Secondary | ICD-10-CM | POA: Diagnosis not present

## 2023-11-29 DIAGNOSIS — K56609 Unspecified intestinal obstruction, unspecified as to partial versus complete obstruction: Secondary | ICD-10-CM

## 2023-11-29 DIAGNOSIS — Z7984 Long term (current) use of oral hypoglycemic drugs: Secondary | ICD-10-CM

## 2023-11-29 DIAGNOSIS — E8809 Other disorders of plasma-protein metabolism, not elsewhere classified: Secondary | ICD-10-CM | POA: Diagnosis not present

## 2023-11-29 DIAGNOSIS — G8929 Other chronic pain: Secondary | ICD-10-CM | POA: Diagnosis present

## 2023-11-29 DIAGNOSIS — M25561 Pain in right knee: Secondary | ICD-10-CM | POA: Diagnosis not present

## 2023-11-29 DIAGNOSIS — Z791 Long term (current) use of non-steroidal anti-inflammatories (NSAID): Secondary | ICD-10-CM

## 2023-11-29 DIAGNOSIS — K5669 Other partial intestinal obstruction: Secondary | ICD-10-CM | POA: Diagnosis not present

## 2023-11-29 DIAGNOSIS — K219 Gastro-esophageal reflux disease without esophagitis: Secondary | ICD-10-CM | POA: Diagnosis present

## 2023-11-29 DIAGNOSIS — M25562 Pain in left knee: Secondary | ICD-10-CM | POA: Diagnosis not present

## 2023-11-29 DIAGNOSIS — T50995A Adverse effect of other drugs, medicaments and biological substances, initial encounter: Secondary | ICD-10-CM | POA: Diagnosis present

## 2023-11-29 DIAGNOSIS — Z4682 Encounter for fitting and adjustment of non-vascular catheter: Secondary | ICD-10-CM | POA: Diagnosis not present

## 2023-11-29 DIAGNOSIS — E785 Hyperlipidemia, unspecified: Secondary | ICD-10-CM | POA: Diagnosis not present

## 2023-11-29 DIAGNOSIS — J309 Allergic rhinitis, unspecified: Secondary | ICD-10-CM | POA: Diagnosis not present

## 2023-11-29 DIAGNOSIS — E876 Hypokalemia: Secondary | ICD-10-CM | POA: Diagnosis present

## 2023-11-29 DIAGNOSIS — E291 Testicular hypofunction: Secondary | ICD-10-CM | POA: Diagnosis present

## 2023-11-29 DIAGNOSIS — I10 Essential (primary) hypertension: Secondary | ICD-10-CM | POA: Diagnosis not present

## 2023-11-29 DIAGNOSIS — E1165 Type 2 diabetes mellitus with hyperglycemia: Secondary | ICD-10-CM | POA: Diagnosis not present

## 2023-11-29 DIAGNOSIS — E038 Other specified hypothyroidism: Secondary | ICD-10-CM | POA: Diagnosis present

## 2023-11-29 DIAGNOSIS — Z83438 Family history of other disorder of lipoprotein metabolism and other lipidemia: Secondary | ICD-10-CM

## 2023-11-29 DIAGNOSIS — D751 Secondary polycythemia: Secondary | ICD-10-CM | POA: Diagnosis present

## 2023-11-29 DIAGNOSIS — Z8349 Family history of other endocrine, nutritional and metabolic diseases: Secondary | ICD-10-CM

## 2023-11-29 DIAGNOSIS — H1045 Other chronic allergic conjunctivitis: Secondary | ICD-10-CM | POA: Insufficient documentation

## 2023-11-29 DIAGNOSIS — Z79899 Other long term (current) drug therapy: Secondary | ICD-10-CM

## 2023-11-29 DIAGNOSIS — G63 Polyneuropathy in diseases classified elsewhere: Secondary | ICD-10-CM | POA: Diagnosis present

## 2023-11-29 DIAGNOSIS — K5730 Diverticulosis of large intestine without perforation or abscess without bleeding: Secondary | ICD-10-CM | POA: Diagnosis not present

## 2023-11-29 DIAGNOSIS — Z713 Dietary counseling and surveillance: Secondary | ICD-10-CM

## 2023-11-29 DIAGNOSIS — E66812 Obesity, class 2: Secondary | ICD-10-CM | POA: Diagnosis present

## 2023-11-29 HISTORY — DX: Nausea with vomiting, unspecified: R11.2

## 2023-11-29 LAB — BASIC METABOLIC PANEL WITH GFR
Anion gap: 13 (ref 5–15)
BUN: 17 mg/dL (ref 8–23)
CO2: 20 mmol/L — ABNORMAL LOW (ref 22–32)
Calcium: 7.5 mg/dL — ABNORMAL LOW (ref 8.9–10.3)
Chloride: 102 mmol/L (ref 98–111)
Creatinine, Ser: 1.04 mg/dL (ref 0.61–1.24)
GFR, Estimated: >60 mL/min (ref >60)
Glucose, Bld: 104 mg/dL — ABNORMAL HIGH (ref 70–99)
Potassium: 3.7 mmol/L (ref 3.5–5.1)
Sodium: 135 mmol/L (ref 135–145)

## 2023-11-29 LAB — CBC WITH DIFFERENTIAL/PLATELET
Abs Immature Granulocytes: 0.07 10*3/uL (ref 0.00–0.07)
Basophils Absolute: 0.1 10*3/uL (ref 0.0–0.1)
Basophils Relative: 0 %
Eosinophils Absolute: 0.3 10*3/uL (ref 0.0–0.5)
Eosinophils Relative: 2 %
HCT: 55.2 % — ABNORMAL HIGH (ref 39.0–52.0)
Hemoglobin: 18 g/dL — ABNORMAL HIGH (ref 13.0–17.0)
Immature Granulocytes: 0 %
Lymphocytes Relative: 12 %
Lymphs Abs: 2 10*3/uL (ref 0.7–4.0)
MCH: 30.8 pg (ref 26.0–34.0)
MCHC: 32.6 g/dL (ref 30.0–36.0)
MCV: 94.5 fL (ref 80.0–100.0)
Monocytes Absolute: 1.2 10*3/uL — ABNORMAL HIGH (ref 0.1–1.0)
Monocytes Relative: 7 %
Neutro Abs: 12.5 10*3/uL — ABNORMAL HIGH (ref 1.7–7.7)
Neutrophils Relative %: 79 %
Platelets: 319 10*3/uL (ref 150–400)
RBC: 5.84 MIL/uL — ABNORMAL HIGH (ref 4.22–5.81)
RDW: 13.2 % (ref 11.5–15.5)
WBC: 16 10*3/uL — ABNORMAL HIGH (ref 4.0–10.5)
nRBC: 0 % (ref 0.0–0.2)

## 2023-11-29 LAB — COMPREHENSIVE METABOLIC PANEL WITH GFR
ALT: 28 U/L (ref 0–44)
AST: 27 U/L (ref 15–41)
Albumin: 4.2 g/dL (ref 3.5–5.0)
Alkaline Phosphatase: 56 U/L (ref 38–126)
Anion gap: 13 (ref 5–15)
BUN: 18 mg/dL (ref 8–23)
CO2: 21 mmol/L — ABNORMAL LOW (ref 22–32)
Calcium: 8.5 mg/dL — ABNORMAL LOW (ref 8.9–10.3)
Chloride: 100 mmol/L (ref 98–111)
Creatinine, Ser: 1.25 mg/dL — ABNORMAL HIGH (ref 0.61–1.24)
GFR, Estimated: >60 mL/min
Glucose, Bld: 152 mg/dL — ABNORMAL HIGH (ref 70–99)
Potassium: 3.8 mmol/L (ref 3.5–5.1)
Sodium: 134 mmol/L — ABNORMAL LOW (ref 135–145)
Total Bilirubin: 1.4 mg/dL — ABNORMAL HIGH (ref 0.0–1.2)
Total Protein: 8.2 g/dL — ABNORMAL HIGH (ref 6.5–8.1)

## 2023-11-29 LAB — MAGNESIUM: Magnesium: 1.8 mg/dL (ref 1.7–2.4)

## 2023-11-29 LAB — PHOSPHORUS: Phosphorus: 2.2 mg/dL — ABNORMAL LOW (ref 2.5–4.6)

## 2023-11-29 LAB — GLUCOSE, CAPILLARY
Glucose-Capillary: 107 mg/dL — ABNORMAL HIGH (ref 70–99)
Glucose-Capillary: 108 mg/dL — ABNORMAL HIGH (ref 70–99)

## 2023-11-29 LAB — LIPASE, BLOOD: Lipase: 29 U/L (ref 11–51)

## 2023-11-29 MED ORDER — SODIUM CHLORIDE 0.9 % IV BOLUS
1000.0000 mL | Freq: Once | INTRAVENOUS | Status: AC
  Administered 2023-11-29: 1000 mL via INTRAVENOUS

## 2023-11-29 MED ORDER — ONDANSETRON HCL 4 MG/2ML IJ SOLN
4.0000 mg | Freq: Once | INTRAMUSCULAR | Status: AC
  Administered 2023-11-29: 4 mg via INTRAVENOUS
  Filled 2023-11-29: qty 2

## 2023-11-29 MED ORDER — IOHEXOL 300 MG/ML  SOLN
100.0000 mL | Freq: Once | INTRAMUSCULAR | Status: AC | PRN
  Administered 2023-11-29: 100 mL via INTRAVENOUS

## 2023-11-29 MED ORDER — ACETAMINOPHEN 650 MG RE SUPP: 650.0000 mg | Freq: Four times a day (QID) | RECTAL | Status: DC | PRN

## 2023-11-29 MED ORDER — PROCHLORPERAZINE EDISYLATE 10 MG/2ML IJ SOLN
10.0000 mg | Freq: Four times a day (QID) | INTRAMUSCULAR | Status: DC | PRN
  Administered 2023-11-29: 10 mg via INTRAVENOUS
  Filled 2023-11-29: qty 2

## 2023-11-29 MED ORDER — POTASSIUM PHOSPHATES 15 MMOLE/5ML IV SOLN
15.0000 mmol | Freq: Once | INTRAVENOUS | Status: AC
  Administered 2023-11-29: 15 mmol via INTRAVENOUS
  Filled 2023-11-29: qty 5

## 2023-11-29 MED ORDER — HYDROMORPHONE HCL 1 MG/ML IJ SOLN: 0.7500 mg | INTRAMUSCULAR | Status: DC | PRN

## 2023-11-29 MED ORDER — POTASSIUM CHLORIDE 10 MEQ/100ML IV SOLN
10.0000 meq | INTRAVENOUS | Status: DC
Start: 2023-11-29 — End: 2023-11-29
  Administered 2023-11-29 (×2): 10 meq via INTRAVENOUS
  Filled 2023-11-29 (×2): qty 100

## 2023-11-29 MED ORDER — ALUM & MAG HYDROXIDE-SIMETH 200-200-20 MG/5ML PO SUSP
30.0000 mL | Freq: Once | ORAL | Status: AC
  Administered 2023-11-29: 30 mL via ORAL
  Filled 2023-11-29: qty 30

## 2023-11-29 MED ORDER — POTASSIUM CHLORIDE IN NACL 40-0.9 MEQ/L-% IV SOLN: INTRAVENOUS | Status: DC

## 2023-11-29 MED ORDER — PHENOL 1.4 % MT LIQD
1.0000 | OROMUCOSAL | Status: DC | PRN
  Filled 2023-11-29 (×2): qty 177

## 2023-11-29 MED ORDER — SODIUM CHLORIDE 0.9 % IV SOLN: Freq: Once | INTRAVENOUS | Status: AC

## 2023-11-29 MED ORDER — PANTOPRAZOLE SODIUM 40 MG IV SOLR
40.0000 mg | INTRAVENOUS | Status: DC
  Administered 2023-11-29: 40 mg via INTRAVENOUS
  Filled 2023-11-29: qty 10

## 2023-11-29 MED ORDER — POTASSIUM CHLORIDE 10 MEQ/100ML IV SOLN
10.0000 meq | INTRAVENOUS | Status: AC
  Administered 2023-11-29 (×2): 10 meq via INTRAVENOUS
  Filled 2023-11-29 (×2): qty 100

## 2023-11-29 MED ORDER — ONDANSETRON HCL 4 MG PO TABS
4.0000 mg | ORAL_TABLET | Freq: Four times a day (QID) | ORAL | Status: DC | PRN
Start: 2023-11-29 — End: 2023-12-01

## 2023-11-29 MED ORDER — POTASSIUM CHLORIDE 10 MEQ/100ML IV SOLN: 10.0000 meq | INTRAVENOUS | Status: DC

## 2023-11-29 MED ORDER — ACETAMINOPHEN 325 MG PO TABS: 650.0000 mg | ORAL_TABLET | Freq: Four times a day (QID) | ORAL | Status: DC | PRN

## 2023-11-29 MED ORDER — ONDANSETRON HCL 4 MG/2ML IJ SOLN
4.0000 mg | Freq: Four times a day (QID) | INTRAMUSCULAR | Status: DC | PRN
Start: 2023-11-29 — End: 2023-12-01
  Administered 2023-11-29 – 2023-11-30 (×2): 4 mg via INTRAVENOUS
  Filled 2023-11-29 (×2): qty 2

## 2023-11-29 MED ORDER — SODIUM CHLORIDE 0.9 % IV SOLN: INTRAVENOUS | Status: DC

## 2023-11-29 MED ORDER — MAGNESIUM SULFATE 2 GM/50ML IV SOLN
2.0000 g | Freq: Once | INTRAVENOUS | Status: AC
  Administered 2023-11-29: 2 g via INTRAVENOUS
  Filled 2023-11-29: qty 50

## 2023-11-29 MED ORDER — DIATRIZOATE MEGLUMINE & SODIUM 66-10 % PO SOLN
90.0000 mL | Freq: Once | ORAL | Status: AC
  Administered 2023-11-29: 90 mL via NASOGASTRIC
  Filled 2023-11-29: qty 90

## 2023-11-29 NOTE — ED Provider Notes (Signed)
 [facility] EMERGENCY DEPARTMENT AT [facility] Provider Note   CSN: [id number] Arrival date & time: 11/29/23  0915     History  Chief Complaint  Patient presents with   Emesis    Connor Becker. is a 73 y.o. male.  73 year old male with prior medical history as detailed below presents for evaluation.  Patient complains of nausea, vomiting, diarrhea.  Symptoms began last night.  He took some promethazine  at home with minimal improvement.  He denies objective fever.  He complains of diffuse abdominal discomfort and feeling "gassy".  The history is provided by the patient.       Home Medications Prior to Admission medications   Medication Sig Start Date End Date Taking? Authorizing Provider  albuterol  (VENTOLIN  HFA) 108 (90 Base) MCG/ACT inhaler Inhale 2 puffs into the lungs every 6 (six) hours as needed for wheezing or shortness of breath. Patient not taking: Reported on 11/13/2023 08/02/22   Cleave Curling, MD  azelastine  (ASTELIN ) 0.1 % nasal spray Place 2 sprays into both nostrils 2 (two) times daily. Use in each nostril as directed 08/22/21   Cleave Curling, MD  Continuous Blood Gluc Receiver (FREESTYLE LIBRE 2 READER) DEVI 1 Device by Does not apply route as directed. 08/15/20   Shamleffer, Ibtehal Jaralla, MD  Continuous Glucose Sensor (FREESTYLE LIBRE 3 SENSOR) MISC 1 Device by Does not apply route every 14 (fourteen) days. Place 1 sensor on the skin every 14 days. Use to check glucose continuously 02/07/23   Shamleffer, Julian Obey, MD  EPINEPHrine  0.3 mg/0.3 mL IJ SOAJ injection Inject 0.3 mg into the muscle as needed for anaphylaxis. 07/14/20   Ghumman, Ramandeep, NP  ezetimibe  (ZETIA ) 10 MG tablet Take 1 tablet by mouth once daily 11/14/23   Cleave Curling, MD  fluticasone  (FLONASE ) 50 MCG/ACT nasal spray Place 2 sprays into both nostrils daily. 06/08/22   Angelia Kelp, PA-C  latanoprost (XALATAN) 0.005 % ophthalmic solution Place 1 drop into both eyes  daily.    [provider]  levothyroxine  (SYNTHROID ) 112 MCG tablet Take 1 tablet (112 mcg total) by mouth daily. 01/31/23   Cleave Curling, MD  lisinopril  (ZESTRIL ) 5 MG tablet Take 1 tablet by mouth once daily 11/18/23   Cleave Curling, MD  MOUNJARO 5 MG/0.5ML Pen INJECT 5 MG SUBCUTANEOUSLY  ONCE A WEEK 10/28/23   Cleave Curling, MD  [facility] VERIO test strip USE  STRIP TO CHECK GLUCOSE 4 TIMES DAILY 10/31/22   Cleave Curling, MD  rosuvastatin  (CRESTOR ) 40 MG tablet Take 1 tablet (40 mg total) by mouth daily. 08/13/23   Shamleffer, Ibtehal Jaralla, MD  tadalafil  (CIALIS ) 5 MG tablet Take 5 mg by mouth daily. 04/21/18   [provider]  Testosterone  1.62 % GEL Apply 2 pumps daily 09/11/23   [provider]  timolol  (TIMOPTIC ) 0.5 % ophthalmic solution Place 1 drop into both eyes daily. 06/11/18   [provider]  Vitamin D , Ergocalciferol , (DRISDOL ) 1.25 MG (50000 UNIT) CAPS capsule Take 1 capsule (50,000 Units total) by mouth See admin instructions. Take one capsule by mouth on Tues/Fridays Patient not taking: Reported on 11/13/2023 10/19/20   Cleave Curling, MD      Allergies    Horseradish Daphene Dys rusticana ext (horseradish)], Lipitor [atorvastatin ], and Metformin  and related    Review of Systems   Review of Systems  All other systems reviewed and are negative.   Physical Exam Updated Vital Signs BP (!) 128/91 (BP Location: Right Arm)   Pulse Aaron Aas)  115   Temp 98.3 F (36.8 C) (Oral)   Resp 18   Ht 6\' 2"  (1.88 m)   Wt 136.1 kg   SpO2 100%   BMI 38.52 kg/m  Physical Exam Vitals and nursing note reviewed.  Constitutional:      General: He is not in acute distress.    Appearance: Normal appearance. He is well-developed.  HENT:     Head: Normocephalic and atraumatic.  Eyes:     Conjunctiva/sclera: Conjunctivae normal.     Pupils: Pupils are equal, round, and reactive to light.  Cardiovascular:     Rate and Rhythm: Normal rate and regular rhythm.      Heart sounds: Normal heart sounds.  Pulmonary:     Effort: Pulmonary effort is normal. No respiratory distress.     Breath sounds: Normal breath sounds.  Abdominal:     General: There is distension.     Palpations: Abdomen is soft.     Tenderness: There is abdominal tenderness.  Musculoskeletal:        General: No deformity. Normal range of motion.     Cervical back: Normal range of motion and neck supple.  Skin:    General: Skin is warm and dry.  Neurological:     General: No focal deficit present.     Mental Status: He is alert and oriented to person, place, and time.     ED Results / Procedures / Treatments   Labs (all labs ordered are listed, but only abnormal results are displayed) Labs Reviewed  COMPREHENSIVE METABOLIC PANEL WITH GFR  LIPASE, BLOOD  CBC WITH DIFFERENTIAL/PLATELET    EKG None  Radiology No results found.  Procedures Procedures    Medications Ordered in ED Medications  ondansetron  (ZOFRAN ) injection 4 mg (has no administration in time range)  sodium chloride  0.9 % bolus 1,000 mL (1,000 mLs Intravenous New Bag/Given 11/29/23 0955)    ED Course/ Medical Decision Making/ A&P                                 Medical Decision Making Amount and/or Complexity of Data Reviewed Labs: ordered. Radiology: ordered.  Risk OTC drugs. Prescription drug management. Decision regarding hospitalization.    Medical Screen Complete  This patient presented to the ED with complaint of nausea, vomiting, abdominal distention.  This complaint involves an extensive number of treatment options. The initial differential diagnosis includes, but is not limited to, ileus, obstruction, metabolic abnormality, etc.  This presentation is: Acute, Self-Limited, Previously Undiagnosed, Uncertain Prognosis, Complicated, Systemic Symptoms, and Threat to Life/Bodily Function  Patient with 24 hours of nausea, vomiting, abdominal distention.  Patient reports recent use  of Mounjaro.  Patient without prior history of bowel obstruction.  CT is concerning for ileus versus early small bowel obstruction.  General surgery aware case will consult.  Hospitalist service aware of case and will admit.  Additional history obtained: External records from outside sources obtained and reviewed including prior ED visits and prior Inpatient records.    Problem List / ED Course:  Abdominal pain, nausea, vomiting, concern for ileus versus small bowel obstruction  Disposition:  After consideration of the diagnostic results and the patients response to treatment, I feel that the patent would benefit from admission.          Final Clinical Impression(s) / ED Diagnoses Final diagnoses:  Nausea and vomiting, unspecified vomiting type    Rx / DC Orders  ED Discharge Orders     None         Burnette Carte, MD 11/29/23 1440

## 2023-11-29 NOTE — H&P (Signed)
 History and Physical    Patient: Connor Becker. UVO:[id number] DOB: 04-Feb-1951 DOA: 11/29/2023 DOS: the patient was seen and examined on 11/29/2023 PCP: Cleave Curling, MD  Patient coming from: Home  Chief Complaint:  Chief Complaint  Patient presents with   Emesis   HPI: Connor Becker. is a 73 y.o. male with medical history significant of [place] 90s removed chronic arthritic pains of the knees, type 2 diabetes, GERD, glaucoma, hyperlipidemia, male hypogonadism, hypothyroidism, sleep apnea not on CPAP, class II obesity, vitamin D  deficiency who presented to the emergency department complaints of abdominal pain, abdominal distention, nausea, vomiting and diarrhea after increasing his Mounjaro dose from 2.5 to 5 mg 2 days ago.  He did not have any significant side effects with the lower dose.   No constipation, melena or hematochezia.  No flank pain, dysuria, frequency or hematuria. He denied fever, chills, rhinorrhea, sore throat, wheezing or hemoptysis.  No chest pain, palpitations, diaphoresis, PND, orthopnea or pitting edema of the lower extremities. No polyuria, polydipsia, polyphagia or blurred vision.   Lab work: CBC showed white count of 16.0 with 79% neutrophils, hemoglobin 18.0 g/dL platelets 742.  Lipase was 29 units/L.  CMP showed a CO2 of 21 mmol/L with a normal anion gap, total protein 8.2 g/dL.  Glucose was 152, creatinine 1.25, corrected calcium  8.4 and total bilirubin 1.4 mg/dL.  The rest of the electrolytes, BUN and the rest of the hepatic functions were normal.  Magnesium  was 1.8 and phosphorus 2.2 mg/dL.  Imaging: CT abdomen/pelvis with contrast showed ileus versus less likely developing small bowel obstruction.  Distal colonic diverticulosis.  Probable fatty liver.  Aortic atherosclerosis.  ED course: Initial vital signs were temperature 98.3 F, pulse 115, respiration 18, BP 128/91 mmHg O2 sat 100% on room air.  The patient received 1000 mL normal saline bolus,  ondansetron  4 mg IVP x 2 and 30 mL of antiacid.   Review of Systems: As mentioned in the history of present illness. All other systems reviewed and are negative.  Past Medical History:  Diagnosis Date   ALLERGIC RHINITIS    Chronic pain of both knees    DJD (degenerative joint disease) of knee    right   DM2 (diabetes mellitus, type 2) (HCC)    GERD    Glaucoma    History of nuclear stress test    Myoview  12/17: EF 53, no ischemia, Low Risk   HYPERLIPIDEMIA    myalgias from Lipitor   HYPOGONADISM, MALE    Hypothyroidism    INSOMNIA-SLEEP DISORDER-UNSPEC    Joint pain    Obesity    OSTEOARTHRITIS    SLEEP APNEA, OBSTRUCTIVE    no CPAP; repeat sleep test pending 07/2016   Unspecified hypothyroidism 12/04/2013   Vitamin D  deficiency    Past Surgical History:  Procedure Laterality Date   NASAL SEPTUM SURGERY     TONSILLECTOMY     UVULOPALATOPHARYNGOPLASTY     VASECTOMY     Social History:  reports that he has never smoked. He has never used smokeless tobacco. He reports that he does not currently use alcohol. He reports that he does not use drugs.  Allergies  Allergen Reactions   Horseradish [Armoracia Rusticana Ext (Horseradish)] Anaphylaxis   Other Anaphylaxis    Soy sauce only    Lipitor [Atorvastatin ] Other (See Comments)    Leg cramps   Metformin  And Related Other (See Comments)    GI upset    Family History  Problem  Relation Age of Onset   Heart attack Mother 41       s/p MI   Other Mother        pacemaker   Diabetes Mother    High blood pressure Mother    High Cholesterol Mother    Thyroid  disease Mother    Obesity Mother    Macular degeneration Father    Thyroid  disease Father    Diabetes Maternal Uncle    Heart attack Maternal Uncle    Hypertension Other    Prostate cancer Other    Heart disease Other    Colon cancer Neg Hx     Prior to Admission medications   Medication Sig Start Date End Date Taking? Authorizing Provider  azelastine   (ASTELIN ) 0.1 % nasal spray Place 2 sprays into both nostrils 2 (two) times daily. Use in each nostril as directed Patient taking differently: Place 1 spray into both nostrils daily as needed for allergies. 08/22/21  Yes Cleave Curling, MD  ezetimibe  (ZETIA ) 10 MG tablet Take 1 tablet by mouth once daily 11/14/23  Yes Cleave Curling, MD  fluticasone  (FLONASE ) 50 MCG/ACT nasal spray Place 2 sprays into both nostrils daily. Patient taking differently: Place 1 spray into both nostrils daily as needed for allergies. 06/08/22  Yes Angelia Kelp, PA-C  glimepiride  (AMARYL ) 2 MG tablet Take 2 mg by mouth daily with breakfast.   Yes [provider]  latanoprost (XALATAN) 0.005 % ophthalmic solution Place 1 drop into both eyes daily.   Yes [provider]  levothyroxine  (SYNTHROID ) 112 MCG tablet Take 1 tablet (112 mcg total) by mouth daily. 01/31/23  Yes Cleave Curling, MD  lisinopril  (ZESTRIL ) 5 MG tablet Take 1 tablet by mouth once daily 11/18/23  Yes Cleave Curling, MD  MOUNJARO 5 MG/0.5ML Pen INJECT 5 MG SUBCUTANEOUSLY  ONCE A WEEK 10/28/23  Yes Cleave Curling, MD  rosuvastatin  (CRESTOR ) 40 MG tablet Take 1 tablet (40 mg total) by mouth daily. 08/13/23  Yes Shamleffer, Ibtehal Jaralla, MD  tadalafil  (CIALIS ) 5 MG tablet Take 5 mg by mouth daily. 04/21/18  Yes [provider]  Testosterone  1.62 % GEL Apply 2 pumps daily 09/11/23  Yes [provider]  timolol  (TIMOPTIC ) 0.5 % ophthalmic solution Place 1 drop into both eyes at bedtime. 06/11/18  Yes [provider]  albuterol  (VENTOLIN  HFA) 108 (90 Base) MCG/ACT inhaler Inhale 2 puffs into the lungs every 6 (six) hours as needed for wheezing or shortness of breath. Patient not taking: Reported on 08/13/2023 08/02/22   Cleave Curling, MD  Continuous Glucose Sensor (FREESTYLE LIBRE 3 SENSOR) MISC 1 Device by Does not apply route every 14 (fourteen) days. Place 1 sensor on the skin every 14 days. Use to check glucose  continuously 02/07/23   Shamleffer, Julian Obey, MD  EPINEPHrine  0.3 mg/0.3 mL IJ SOAJ injection Inject 0.3 mg into the muscle as needed for anaphylaxis. Patient not taking: Reported on 11/29/2023 07/14/20   Ghumman, Ramandeep, NP  Vitamin D , Ergocalciferol , (DRISDOL ) 1.25 MG (50000 UNIT) CAPS capsule Take 1 capsule (50,000 Units total) by mouth See admin instructions. Take one capsule by mouth on Tues/Fridays Patient not taking: Reported on 11/13/2023 10/19/20   Cleave Curling, MD    Physical Exam: Vitals:   11/29/23 0919 11/29/23 0930 11/29/23 1145 11/29/23 1300  BP: (!) 128/91  108/73 (!) 140/93  Pulse: (!) 115  93 86  Resp: 18  17 15   Temp: 98.3 F (36.8 C)     TempSrc: Oral  SpO2: 100%  94% 91%  Weight: 136.1 kg 136.1 kg    Height: 6\' 2"  (1.88 m)      Physical Exam Vitals reviewed.  Constitutional:      General: He is awake. He is not in acute distress.    Appearance: He is ill-appearing.  HENT:     Head: Normocephalic.     Nose: No rhinorrhea.     Mouth/Throat:     Mouth: Mucous membranes are dry.  Eyes:     General: No scleral icterus.    Pupils: Pupils are equal, round, and reactive to light.  Neck:     Vascular: No JVD.  Cardiovascular:     Rate and Rhythm: Normal rate and regular rhythm.     Heart sounds: S1 normal and S2 normal.  Pulmonary:     Effort: Pulmonary effort is normal.     Breath sounds: Normal breath sounds. No wheezing, rhonchi or rales.  Abdominal:     General: Abdomen is protuberant. Bowel sounds are normal. There is distension.     Palpations: Abdomen is soft.     Tenderness: There is abdominal tenderness. There is no right CVA tenderness, left CVA tenderness, guarding or rebound.  Musculoskeletal:     Cervical back: Neck supple.     Right lower leg: No edema.     Left lower leg: No edema.  Skin:    General: Skin is warm and dry.  Neurological:     General: No focal deficit present.     Mental Status: He is alert and oriented to  person, place, and time.  Psychiatric:        Mood and Affect: Mood normal.        Behavior: Behavior normal. Behavior is cooperative.     Data Reviewed:  Results are pending, will review when available. EKG: Vent. rate 92 BPM PR interval 150 ms QRS duration 100 ms QT/QTcB 350/433 ms P-R-T axes 6 -60 28 Sinus rhythm Left anterior fascicular block Abnormal R-wave progression, late transition  Assessment and Plan: Principal Problem:   Abdominal distension Associated with:   Abdominal pain And:   Nausea, vomiting and diarrhea  Inpatient/MedSurg. Keep NPO. Continue NTG at L[facility]. Continue IV fluids. Analgesics as needed. Antiemetics as needed. Pantoprazole  40 mg IVP every 24 hours. Keep electrolytes optimized. Follow-up CBC and CMP in AM. Follow-up imaging in the morning. General surgery input appreciated.  Active Problems:   Hypokalemia  KCl 10 mg IVPB x 4. Follow potassium level in the morning. Magnesium  sulfate 2 g IVPB given. Keep electrolytes optimized.    Hypophosphatemia Replacing.    Hyperbilirubinemia Secondary to poor oral intake/volume depletion. Will follow level in the morning.    Polycythemia Due to hemoconcentration. Continue IV fluids. Follow-up CBC in AM.    Primary hypothyroidism Resume levothyroxine  once cleared for oral intake. If still on NG tube suctioning in 48 hours we will do IV levothyroxine .    GERD On IV PPI.    Type 2 diabetes mellitus with hyperglycemia (HCC) CBG monitoring every 6 hours.    Obstructive sleep apnea She does not use it at home. Contraindicated in this case.    Class 2 severe obesity with body mass index  (BMI) of 35 to 39.9 with serious comorbidity (HCC) Current BMI 38.52 kg/m. BMI improving on Mounjaro. Lifestyle modifications. Follow-up closely with PCP and/or endocrinology..    Dyslipidemia   Atherosclerosis of aorta (HCC) Resume statin once cleared for oral intake.    Polyneuropathy  associated with underlying disease (HCC) Currently NPO. Analgesics as needed.      Advance Care Planning:   Code Status: Full Code  Consults: [facility].  Family Communication:   Severity of Illness: The appropriate patient status for this patient is INPATIENT. Inpatient status is judged to be reasonable and necessary in order to provide the required intensity of service to ensure the patient's safety. The patient's presenting symptoms, physical exam findings, and initial radiographic and laboratory data in the context of their chronic comorbidities is felt to place them at high risk for further clinical deterioration. Furthermore, it is not anticipated that the patient will be medically stable for discharge from the hospital within 2 midnights of admission.   * I certify that at the point of admission it is my clinical judgment that the patient will require inpatient hospital care spanning beyond 2 midnights from the point of admission due to high intensity of service, high risk for further deterioration and high frequency of surveillance required.*  Author: Danice Dural, MD 11/29/2023 1:30 PM  For on call review [email or web address].   This document was prepared using Dragon voice recognition software and may contain some unintended transcription errors.

## 2023-11-29 NOTE — Plan of Care (Signed)

## 2023-11-29 NOTE — ED Triage Notes (Addendum)
 Here by POV from home for NVD, bloated, epigastric burning. Onset yesterday. Monjaro injection on Wednesday. Vx2 and Dx2 in last 24 hrs. Mentions decreased U.O. Alert, NAD, calm, interactive, steady gait.

## 2023-11-29 NOTE — Consult Note (Signed)
 Connor Becker. 06-19-1951  [id number].    Requesting MD: Angela Kell, MD Chief Complaint/Reason for Consult: SBO  HPI: Connor Becker. is a 73 y.o. male who presented to the ED w/ abdominal pain. Patient reports 2-3 days ago he began having generalized abdominal pressure, distension, n/v/d, burping, belching and hiccups. No suspicious foods or contact w/ persons w/ similar symptoms. 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 Last episode of vomiting was in the ED. Vomiting makes his symptoms better. Nothing else (Mylanta etc) seems to make his symptoms better. Last episode of flatus or diarrhea was 3am. Reports hx of similar symptoms in the past when he had Norovirus. No hx of sbo's in the past. He has never had prior abdominal surgery.  CT showed distended fluid filled stomach, multiple mildly dilated fluid-filled loops of small bowel measure up to approximately 5 cm in diameter with a gradual transition in the right lower quadrant. The appendix was not visualized but there was no inflammatory changes identified in the right lower quadrant.  Patient does have a 1.3 cm fatty lesion in the proximal transverse colon that radiology feels is likely a lipoma. This appears to have been noted on colonoscopy in 2022  Past Medical History: DM2, HLD, Hypothyroidism, OSA Prior Abdominal Surgeries: None Blood Thinners: None Last Colonoscopy: 2022. Diverticulosis, polyps in the ascending and transverse colon (path - tubular adenomas, neg for high grade dysplasia or malignancy), lipoma of the proximal transverse colon, and internal hemorrhoids.   ROS: ROS As above, see hpi  Family History  Problem Relation Age of Onset   Heart attack Mother 8       s/p MI   Other Mother        pacemaker   Diabetes Mother    High blood pressure Mother    High Cholesterol Mother    Thyroid  disease Mother    Obesity Mother    Macular degeneration Father    Thyroid  disease  Father    Diabetes Maternal Uncle    Heart attack Maternal Uncle    Hypertension Other    Prostate cancer Other    Heart disease Other    Colon cancer Neg Hx     Past Medical History:  Diagnosis Date   ALLERGIC RHINITIS    Chronic pain of both knees    DJD (degenerative joint disease) of knee    right   DM2 (diabetes mellitus, type 2) (HCC)    GERD    Glaucoma    History of nuclear stress test    Myoview  12/17: EF 53, no ischemia, Low Risk   HYPERLIPIDEMIA    myalgias from Lipitor   HYPOGONADISM, MALE    Hypothyroidism    INSOMNIA-SLEEP DISORDER-UNSPEC    Joint pain    Obesity    OSTEOARTHRITIS    SLEEP APNEA, OBSTRUCTIVE    no CPAP; repeat sleep test pending 07/2016   Unspecified hypothyroidism 12/04/2013   Vitamin D  deficiency     Past Surgical History:  Procedure Laterality Date   NASAL SEPTUM SURGERY     TONSILLECTOMY     UVULOPALATOPHARYNGOPLASTY     VASECTOMY      Social History:  reports that he has never smoked. He has never used smokeless tobacco. He reports that he does not currently use alcohol. He reports that he does not use drugs.  Allergies:  Allergies  Allergen Reactions   Horseradish [Armoracia Rusticana Ext (Horseradish)] Anaphylaxis  Other Anaphylaxis    Soy sauce only    Lipitor [Atorvastatin ] Other (See Comments)    Leg cramps   Metformin  And Related Other (See Comments)    GI upset    (Not in a hospital admission)    Physical Exam: Blood pressure (!) 140/93, pulse 86, temperature 98.2 F (36.8 C), resp. rate 15, height 6\' 2"  (1.88 m), weight 136.1 kg, SpO2 91%. General: pleasant, WD/WN male who is laying in bed in NAD HEENT: head is normocephalic, atraumatic.  Sclera are non-icteric.  Heart: regular, rate, and rhythm.   Lungs: CTAB, no wheezes, rhonchi, or rales noted.  Respiratory effort nonlabored Abd:  Soft, moderate distension, NT without rigidity or guarding, +BS. No masses, hernias, or organomegaly MS: no BUE or BLE  edema Skin: warm and dry  Psych: A&Ox4 with an appropriate affect Neuro: normal speech, thought process intact, moves all extremities, gait not assessed  Results for orders placed or performed during the hospital encounter of 11/29/23 (from the past 48 hours)  Comprehensive metabolic panel     Status: Abnormal   Collection Time: 11/29/23 10:00 AM  Result Value Ref Range   Sodium 134 (L) 135 - 145 mmol/L   Potassium 3.8 3.5 - 5.1 mmol/L   Chloride 100 98 - 111 mmol/L   CO2 21 (L) 22 - 32 mmol/L   Glucose, Bld 152 (H) 70 - 99 mg/dL    Comment: Glucose reference range applies only to samples taken after fasting for at least 8 hours.   BUN 18 8 - 23 mg/dL   Creatinine, Ser 1.61 (H) 0.61 - 1.24 mg/dL   Calcium  8.5 (L) 8.9 - 10.3 mg/dL   Total Protein 8.2 (H) 6.5 - 8.1 g/dL   Albumin 4.2 3.5 - 5.0 g/dL   AST 27 15 - 41 U/L   ALT 28 0 - 44 U/L   Alkaline Phosphatase 56 38 - 126 U/L   Total Bilirubin 1.4 (H) 0.0 - 1.2 mg/dL   GFR, Estimated >09 >60 mL/min    Comment: (NOTE) Calculated using the CKD-EPI Creatinine Equation (2021)    Anion gap 13 5 - 15    Comment: Performed at [facility], [address], [place], [place] [address]  Lipase, blood     Status: None   Collection Time: 11/29/23 10:00 AM  Result Value Ref Range   Lipase 29 11 - 51 U/L    Comment: Performed at [facility], [address] [place], [place] [address]  CBC with Differential     Status: Abnormal   Collection Time: 11/29/23 10:00 AM  Result Value Ref Range   WBC 16.0 (H) 4.0 - 10.5 K/uL   RBC 5.84 (H) 4.22 - 5.81 MIL/uL   Hemoglobin 18.0 (H) 13.0 - 17.0 g/dL   HCT 47.8 (H) 29.5 - 62.1 %   MCV 94.5 80.0 - 100.0 fL   MCH 30.8 26.0 - 34.0 pg   MCHC 32.6 30.0 - 36.0 g/dL   RDW 30.8 65.7 - 84.6 %   Platelets 319 150 - 400 K/uL   nRBC 0.0 0.0 - 0.2 %   Neutrophils Relative % 79 %   Neutro Abs 12.5 (H) 1.7 - 7.7 K/uL   Lymphocytes Relative 12 %   Lymphs Abs 2.0 0.7 -  4.0 K/uL   Monocytes Relative 7 %   Monocytes Absolute 1.2 (H) 0.1 - 1.0 K/uL   Eosinophils Relative 2 %   Eosinophils Absolute 0.3 0.0 - 0.5  K/uL   Basophils Relative 0 %   Basophils Absolute 0.1 0.0 - 0.1 K/uL   Immature Granulocytes 0 %   Abs Immature Granulocytes 0.07 0.00 - 0.07 K/uL    Comment: Performed at [facility], [address], [place], [place] [address]   CT ABDOMEN PELVIS W CONTRAST Result Date: 11/29/2023 CLINICAL DATA:  Abdominal pain. EXAM: CT ABDOMEN AND PELVIS WITH CONTRAST TECHNIQUE: Multidetector CT imaging of the abdomen and pelvis was performed using the standard protocol following bolus administration of intravenous contrast. RADIATION DOSE REDUCTION: This exam was performed according to the departmental dose-optimization program which includes automated exposure control, adjustment of the mA and/or kV according to patient size and/or use of iterative reconstruction technique. CONTRAST:  OMNIPAQUE  IOHEXOL  300 MG/ML  SOLN COMPARISON:  CT abdomen pelvis dated 03/18/2021. FINDINGS: Lower chest: The visualized lung bases are clear. No intra-abdominal free air or free fluid. Hepatobiliary: Probable fatty liver. No biliary dilatation. The gallbladder is unremarkable. Pancreas: Unremarkable. No pancreatic ductal dilatation or surrounding inflammatory changes. Spleen: Normal in size without focal abnormality. Adrenals/Urinary Tract: The adrenal glands unremarkable. There is no hydronephrosis on either side. There is symmetric enhancement and excretion of contrast by both kidneys. The visualized ureters and urinary bladder appear unremarkable. Stomach/Bowel: There is distal colonic diverticulosis. There is a 1.3 cm fatty lesion in the proximal transverse colon, likely a lipoma. The stomach is distended with fluid content. Multiple mildly dilated fluid-filled loops of small bowel measure up to approximately 5 cm in diameter. There is a gradual transition in the  right lower quadrant. Findings favored to represent an ileus, although developing obstruction is not excluded. Small-bowel series may provide better evaluation. The appendix is not visualized with certainty. No inflammatory changes identified in the right lower quadrant. Vascular/Lymphatic: Mild aortoiliac atherosclerotic disease. The IVC is unremarkable. No portal venous gas. There is no adenopathy. Reproductive: The prostate and seminal vesicles are grossly unremarkable. Other: None Musculoskeletal: Osteopenia with degenerative changes of the spine. No acute osseous pathology. IMPRESSION: 1. Ileus versus less likely developing small-bowel obstruction. Small-bowel series may provide better evaluation. 2. Distal colonic diverticulosis. 3. Probable fatty liver. 4.  Aortic Atherosclerosis (ICD10-I70.0). Electronically Signed   By: Angus Bark M.D.   On: 11/29/2023 11:34    Anti-infectives (From admission, onward)    None       Assessment/Plan SBO - CT w/ mildly dilated fluid-filled loops of small bowel measure up to approximately 5 cm in diameter with a  gradual transition in the right lower quadrant.  - HDS without fever, tachycardia or hypotension. No peritonitis on exam. WBC is 16 but picture of n/v/d may be more of an enteritis w/ reactive ileus. No current indication for emergency surgery but will continue to follow along to ensure this resolves.  - Place NGT for decompression and keep NPO - Start SBO protocol - Check GI panel - Keep K >=4, Phos >= 3, Mg >= 2 and mobilize for bowel function - Hopefully patient will improve with conservative management. If patient fails to improve with conservative management, they may require exploratory surgery during admission - Agree with medical admission. We will follow with you.  FEN - NPO, NGT to LIWS, IVF per primary  VTE - SCDs, okay for chem ppx from a general surgery standpoint ID - None Foley - None Dispo - Admit to [facility].   I reviewed  nursing notes, ED provider notes, last 24 h vitals and pain scores, last 48 h intake  and output, last 24 h labs and trends, and last 24 h imaging results.   Delton Filbert, [facility] 11/29/2023, 2:28 PM Please see Amion for pager number during day hours 7:00am-4:30pm

## 2023-11-30 ENCOUNTER — Inpatient Hospital Stay (HOSPITAL_COMMUNITY)

## 2023-11-30 ENCOUNTER — Observation Stay (HOSPITAL_COMMUNITY)

## 2023-11-30 DIAGNOSIS — R109 Unspecified abdominal pain: Secondary | ICD-10-CM | POA: Diagnosis not present

## 2023-11-30 DIAGNOSIS — K567 Ileus, unspecified: Secondary | ICD-10-CM | POA: Diagnosis present

## 2023-11-30 DIAGNOSIS — D751 Secondary polycythemia: Secondary | ICD-10-CM | POA: Diagnosis present

## 2023-11-30 DIAGNOSIS — E876 Hypokalemia: Secondary | ICD-10-CM | POA: Diagnosis present

## 2023-11-30 DIAGNOSIS — R17 Unspecified jaundice: Secondary | ICD-10-CM | POA: Diagnosis present

## 2023-11-30 DIAGNOSIS — G8929 Other chronic pain: Secondary | ICD-10-CM | POA: Diagnosis present

## 2023-11-30 DIAGNOSIS — Z4682 Encounter for fitting and adjustment of non-vascular catheter: Secondary | ICD-10-CM | POA: Diagnosis not present

## 2023-11-30 DIAGNOSIS — K219 Gastro-esophageal reflux disease without esophagitis: Secondary | ICD-10-CM | POA: Diagnosis present

## 2023-11-30 DIAGNOSIS — E869 Volume depletion, unspecified: Secondary | ICD-10-CM | POA: Diagnosis present

## 2023-11-30 DIAGNOSIS — E785 Hyperlipidemia, unspecified: Secondary | ICD-10-CM | POA: Diagnosis present

## 2023-11-30 DIAGNOSIS — E1142 Type 2 diabetes mellitus with diabetic polyneuropathy: Secondary | ICD-10-CM | POA: Diagnosis present

## 2023-11-30 DIAGNOSIS — H409 Unspecified glaucoma: Secondary | ICD-10-CM | POA: Diagnosis present

## 2023-11-30 DIAGNOSIS — E038 Other specified hypothyroidism: Secondary | ICD-10-CM | POA: Diagnosis present

## 2023-11-30 DIAGNOSIS — E66812 Obesity, class 2: Secondary | ICD-10-CM | POA: Diagnosis present

## 2023-11-30 DIAGNOSIS — E291 Testicular hypofunction: Secondary | ICD-10-CM | POA: Diagnosis present

## 2023-11-30 DIAGNOSIS — J309 Allergic rhinitis, unspecified: Secondary | ICD-10-CM | POA: Diagnosis present

## 2023-11-30 DIAGNOSIS — E559 Vitamin D deficiency, unspecified: Secondary | ICD-10-CM | POA: Diagnosis present

## 2023-11-30 DIAGNOSIS — K56609 Unspecified intestinal obstruction, unspecified as to partial versus complete obstruction: Secondary | ICD-10-CM | POA: Diagnosis not present

## 2023-11-30 DIAGNOSIS — I700 Atherosclerosis of aorta: Secondary | ICD-10-CM | POA: Diagnosis present

## 2023-11-30 DIAGNOSIS — M25561 Pain in right knee: Secondary | ICD-10-CM | POA: Diagnosis present

## 2023-11-30 DIAGNOSIS — E1165 Type 2 diabetes mellitus with hyperglycemia: Secondary | ICD-10-CM | POA: Diagnosis not present

## 2023-11-30 DIAGNOSIS — I10 Essential (primary) hypertension: Secondary | ICD-10-CM | POA: Diagnosis present

## 2023-11-30 DIAGNOSIS — K5669 Other partial intestinal obstruction: Secondary | ICD-10-CM | POA: Diagnosis not present

## 2023-11-30 DIAGNOSIS — R140 Abdominal distension (gaseous): Secondary | ICD-10-CM | POA: Diagnosis not present

## 2023-11-30 DIAGNOSIS — E8809 Other disorders of plasma-protein metabolism, not elsewhere classified: Secondary | ICD-10-CM | POA: Diagnosis present

## 2023-11-30 DIAGNOSIS — R112 Nausea with vomiting, unspecified: Secondary | ICD-10-CM | POA: Diagnosis present

## 2023-11-30 DIAGNOSIS — G4733 Obstructive sleep apnea (adult) (pediatric): Secondary | ICD-10-CM | POA: Diagnosis present

## 2023-11-30 DIAGNOSIS — Z6838 Body mass index (BMI) 38.0-38.9, adult: Secondary | ICD-10-CM | POA: Diagnosis not present

## 2023-11-30 DIAGNOSIS — R935 Abnormal findings on diagnostic imaging of other abdominal regions, including retroperitoneum: Secondary | ICD-10-CM | POA: Diagnosis not present

## 2023-11-30 DIAGNOSIS — M25562 Pain in left knee: Secondary | ICD-10-CM | POA: Diagnosis present

## 2023-11-30 LAB — GLUCOSE, CAPILLARY
Glucose-Capillary: 102 mg/dL — ABNORMAL HIGH (ref 70–99)
Glucose-Capillary: 109 mg/dL — ABNORMAL HIGH (ref 70–99)
Glucose-Capillary: 110 mg/dL — ABNORMAL HIGH (ref 70–99)
Glucose-Capillary: 87 mg/dL (ref 70–99)
Glucose-Capillary: 89 mg/dL (ref 70–99)
Glucose-Capillary: 93 mg/dL (ref 70–99)
Glucose-Capillary: 99 mg/dL (ref 70–99)

## 2023-11-30 LAB — COMPREHENSIVE METABOLIC PANEL WITH GFR
ALT: 22 U/L (ref 0–44)
AST: 20 U/L (ref 15–41)
Albumin: 3.4 g/dL — ABNORMAL LOW (ref 3.5–5.0)
Alkaline Phosphatase: 47 U/L (ref 38–126)
Anion gap: 7 (ref 5–15)
BUN: 16 mg/dL (ref 8–23)
CO2: 25 mmol/L (ref 22–32)
Calcium: 7.7 mg/dL — ABNORMAL LOW (ref 8.9–10.3)
Chloride: 104 mmol/L (ref 98–111)
Creatinine, Ser: 1.12 mg/dL (ref 0.61–1.24)
GFR, Estimated: >60 mL/min (ref >60)
Glucose, Bld: 114 mg/dL — ABNORMAL HIGH (ref 70–99)
Potassium: 3.8 mmol/L (ref 3.5–5.1)
Sodium: 136 mmol/L (ref 135–145)
Total Bilirubin: 1.3 mg/dL — ABNORMAL HIGH (ref 0.0–1.2)
Total Protein: 6.9 g/dL (ref 6.5–8.1)

## 2023-11-30 LAB — CBC
HCT: 52.6 % — ABNORMAL HIGH (ref 39.0–52.0)
Hemoglobin: 16.5 g/dL (ref 13.0–17.0)
MCH: 31.1 pg (ref 26.0–34.0)
MCHC: 31.4 g/dL (ref 30.0–36.0)
MCV: 99.1 fL (ref 80.0–100.0)
Platelets: 260 10*3/uL (ref 150–400)
RBC: 5.31 MIL/uL (ref 4.22–5.81)
RDW: 13.2 % (ref 11.5–15.5)
WBC: 13.6 10*3/uL — ABNORMAL HIGH (ref 4.0–10.5)
nRBC: 0 % (ref 0.0–0.2)

## 2023-11-30 MED ORDER — DIATRIZOATE MEGLUMINE & SODIUM 66-10 % PO SOLN
90.0000 mL | Freq: Once | ORAL | Status: AC
  Administered 2023-11-30: 90 mL via NASOGASTRIC
  Filled 2023-11-30: qty 90

## 2023-11-30 MED ORDER — INSULIN ASPART 100 UNIT/ML IJ SOLN
0.0000 [IU] | INTRAMUSCULAR | Status: DC
  Administered 2023-12-01 (×2): 2 [IU] via SUBCUTANEOUS

## 2023-11-30 MED ORDER — POTASSIUM CL IN DEXTROSE 5% 20 MEQ/L IV SOLN
20.0000 meq | INTRAVENOUS | Status: DC
  Administered 2023-11-30 – 2023-12-01 (×2): 20 meq via INTRAVENOUS
  Filled 2023-11-30 (×2): qty 1000

## 2023-11-30 NOTE — Plan of Care (Signed)

## 2023-11-30 NOTE — Progress Notes (Signed)
  Progress Note   Patient: Connor Becker. ZOX:[id number] DOB: [birth year] DOA: 11/29/2023     0 DOS: the patient was seen and examined on 11/30/2023 at 8:30AM      Brief hospital course: [name] with obesity, DM, HLD, hypothyroidism, who presented with several days of abdominal discomfort, distention, burping, nausea, and vomiting.    CT abdomen/pelvis with contrast showed ileus versus less likely developing small bowel obstruction.  NG placed and admitted for further evaluation.     Assessment and Plan: Likely Mounjaro related ileus, also possible bowel obstruction In the setting of a transition point on imaging, and nondiagnostic serial x-rays yesterday, we need to repeat Gastrografin  installation to his NG tube and repeat serial x-rays.  - Maintain NG - MIVF - Antiemetics or analgesics as needed  - Stop Mounjaro, GLP-1 agonist indefinitely  Morbid obesity BMI greater than 35 in the setting of diabetes, sleep apnea, and hyperlipidemia  Type 2 diabetes Glucose normal so far - Start sliding scale corrections - Stop Mounjaro - Hold glimepiride   Hyperlipidemia -Resume Crestor , Zetia  when able to take p.o.  Hypothyroidism -Resume levothyroxine  when able to take p.o.  Hypertension Blood pressure normal - Resume lisinopril  when able to take p.o.         Subjective: Patient is passing a little bit of flatus, not much, he is having no significant abdominal pain, this is all resolved, he still somewhat distended.  His NG output is somewhat feculent.     Physical Exam: BP 131/78   Pulse 75   Temp 98.4 F (36.9 C)   Resp 17   Ht 6\' 2"  (1.88 m)   Wt 136.1 kg   SpO2 95%   BMI 38.52 kg/m   Obese adult male, lying in bed, interactive and appropriate RRR, no murmurs, no peripheral edema Respiratory normal, lungs clear without rales or wheezes Abdomen somewhat distended, soft throughout, no discomfort, no guarding Attention normal, affect appropriate,  judgment and insight appear normal    Data Reviewed: Discussed with general surgery X-rays personally reviewed, shows distended small bowel Basic metabolic panel shows hypokalemia, hypoalbuminemia, mildly elevated total bilirubin CBC shows elevated white count, normal hemoglobin and platelets   Family Communication: None present    Disposition: Status is: Inpatient Patient requires ongoing NG tube suction, imaging and IV fluids for nonresolved ileus        Author: Ephriam Hashimoto, MD 11/30/2023 1:46 PM  For on call review [email or web address].

## 2023-11-30 NOTE — Hospital Course (Addendum)
 [name] with obesity, DM, HLD, hypothyroidism, who presented with several days of abdominal discomfort, distention, burping, nausea, and vomiting.    CT abdomen/pelvis with contrast showed ileus versus less likely developing small bowel obstruction.  NG placed and admitted for further evaluation.

## 2023-11-30 NOTE — Progress Notes (Signed)
 Subjective/Chief Complaint: Patient feeling much better.  Having small amounts of flatus and much improved pain/bloating.   Patient reports significant bowel irregularities since starting ozempic  and now mounjouro; exacerbated after norovirus infection  Objective: Vital signs in last 24 hours: Temp:  [98.2 F (36.8 C)-98.4 F (36.9 C)] 98.4 F (36.9 C) (05/17 0547) Pulse Rate:  [84-115] 84 (05/17 0547) Resp:  [15-18] 18 (05/17 0547) BP: (108-156)/(72-98) 115/83 (05/17 0547) SpO2:  [91 %-100 %] 95 % (05/17 0547) Weight:  [136.1 kg] 136.1 kg (05/16 0930) Last BM Date : 11/28/23  Intake/Output from previous day: 05/16 0701 - 05/17 0700 In: 1000 [IV Piggyback:1000] Out: 2250 [Urine:550; Emesis/NG output:1700] Intake/Output this shift: No intake/output data recorded.  Gen:  sleeping, easily awakened, no distress Breathing comfortably Abd:  protuberant, soft, non tender NGT dark brown, good sump sound after flushing sump port, around 400 mL in canister. 1700 mL reported.   Lab Results:  Recent Labs    11/29/23 1000 11/30/23 0519  WBC 16.0* 13.6*  HGB 18.0* 16.5  HCT 55.2* 52.6*  PLT 319 260   BMET Recent Labs    11/29/23 2216 11/30/23 0519  NA 135 136  K 3.7 3.8  CL 102 104  CO2 20* 25  GLUCOSE 104* 114*  BUN 17 16  CREATININE 1.04 1.12  CALCIUM  7.5* 7.7*   PT/INR No results for input(s): "LABPROT", "INR" in the last 72 hours. ABG No results for input(s): "PHART", "HCO3" in the last 72 hours.  Invalid input(s): "PCO2", "PO2"  Studies/Results: DG Abd Portable 1V-Small Bowel Obstruction Protocol-initial, 8 hr delay Result Date: 11/30/2023 CLINICAL DATA:  Small-bowel obstruction. Reportedly this is the 8 hour delayed film. EXAM: PORTABLE ABDOMEN - 1 VIEW COMPARISON:  Flat plate abdomen yesterday at 4:33 p.m., CT yesterday at 11:10 a.m. FINDINGS: 4:20 a.m. If the previously-seen NGT is still in place, it is not included on either of the 2 films obtained for  this exam. The prior study appeared to demonstrate a fair amount of diluted enteric contrast in the stomach. There is contrast in the bladder but there is no visible contrast in the bowel. Subject contrast is limited due to habitus. Widespread small bowel dilatation continues to be seen except in the right lower quadrant where there are normal caliber segments. Maximal small bowel caliber again is 5 cm consistent with intermediate to high-grade obstruction. Paucity of colonic aeration appears similar to the prior study and same day CT. There is no supine evidence of free air. There is advanced marginal osteophytosis of the lumbar spine. IMPRESSION: 1. No visible contrast in the bowel. Subject contrast is limited due to habitus. 2. Widespread small bowel dilatation continues to be seen except in the right lower quadrant where there are normal caliber segments. Maximal small bowel caliber again is 5 cm consistent with intermediate to high-grade obstruction. 3. Paucity of colonic aeration appears similar to the prior study and same day CT. 4. No supine evidence of free air. 5. If the previously-seen NGT is still in place, it is not included on either of the 2 films obtained for this exam. Electronically Signed   By: Denman Fischer M.D.   On: 11/30/2023 05:32   DG Abd Portable 1V-Small Bowel Protocol-Position Verification Result Date: 11/29/2023 CLINICAL DATA:  [id number] Encounter for imaging study to confirm nasogastric (NG) tube placement [id number]. EXAM: PORTABLE ABDOMEN - 1 VIEW COMPARISON:  None Available. FINDINGS: There is gaseous dilatation of small bowel loops, which is disproportionate to the degree  of distention of the colon. Findings may represent small-bowel obstruction versus adynamic ileus. Please refer to same-day performed CT scan abdomen and pelvis report for details. No evidence of pneumoperitoneum. No acute osseous abnormalities. The soft tissues are within normal limits. Surgical changes, devices,  tubes and lines: Enteric tube is seen coursing below the left hemidiaphragm with its tip and side hole overlying the left upper quadrant, overlying the stomach body region. IMPRESSION: *Enteric tube is seen coursing below the left hemidiaphragm with its tip and side hole overlying the stomach body region. Electronically Signed   By: Beula Brunswick M.D.   On: 11/29/2023 16:44   CT ABDOMEN PELVIS W CONTRAST Result Date: 11/29/2023 CLINICAL DATA:  Abdominal pain. EXAM: CT ABDOMEN AND PELVIS WITH CONTRAST TECHNIQUE: Multidetector CT imaging of the abdomen and pelvis was performed using the standard protocol following bolus administration of intravenous contrast. RADIATION DOSE REDUCTION: This exam was performed according to the departmental dose-optimization program which includes automated exposure control, adjustment of the mA and/or kV according to patient size and/or use of iterative reconstruction technique. CONTRAST:  OMNIPAQUE  IOHEXOL  300 MG/ML  SOLN COMPARISON:  CT abdomen pelvis dated 03/18/2021. FINDINGS: Lower chest: The visualized lung bases are clear. No intra-abdominal free air or free fluid. Hepatobiliary: Probable fatty liver. No biliary dilatation. The gallbladder is unremarkable. Pancreas: Unremarkable. No pancreatic ductal dilatation or surrounding inflammatory changes. Spleen: Normal in size without focal abnormality. Adrenals/Urinary Tract: The adrenal glands unremarkable. There is no hydronephrosis on either side. There is symmetric enhancement and excretion of contrast by both kidneys. The visualized ureters and urinary bladder appear unremarkable. Stomach/Bowel: There is distal colonic diverticulosis. There is a 1.3 cm fatty lesion in the proximal transverse colon, likely a lipoma. The stomach is distended with fluid content. Multiple mildly dilated fluid-filled loops of small bowel measure up to approximately 5 cm in diameter. There is a gradual transition in the right lower quadrant.  Findings favored to represent an ileus, although developing obstruction is not excluded. Small-bowel series may provide better evaluation. The appendix is not visualized with certainty. No inflammatory changes identified in the right lower quadrant. Vascular/Lymphatic: Mild aortoiliac atherosclerotic disease. The IVC is unremarkable. No portal venous gas. There is no adenopathy. Reproductive: The prostate and seminal vesicles are grossly unremarkable. Other: None Musculoskeletal: Osteopenia with degenerative changes of the spine. No acute osseous pathology. IMPRESSION: 1. Ileus versus less likely developing small-bowel obstruction. Small-bowel series may provide better evaluation. 2. Distal colonic diverticulosis. 3. Probable fatty liver. 4.  Aortic Atherosclerosis (ICD10-I70.0). Electronically Signed   By: Angus Bark M.D.   On: 11/29/2023 11:34    Anti-infectives: Anti-infectives (From admission, onward)    None       Assessment/Plan: SBO vs ileus. SBO protocol films currently unhelpful as no contrast at all is seen in delayed films.  Also, no NGT is seen in the film, but is is definitely present and functional.   Will repeat gastrograffin and 8 hour and 24 hour film.  He has had some flatus and is having improved symptoms rapidly.    Unsure if clinical history of GLP 1 use and norovirus infection temporal correlation indicates a motility issue or if this is a red herring.  He has not had surgery before and has had some workup looking for malignancy.  Will follow.   Continue NPO and NGT to LIWS.     LOS: 0 days    Lockie Rima 11/30/2023

## 2023-12-01 ENCOUNTER — Inpatient Hospital Stay (HOSPITAL_COMMUNITY)

## 2023-12-01 DIAGNOSIS — R140 Abdominal distension (gaseous): Secondary | ICD-10-CM | POA: Diagnosis not present

## 2023-12-01 LAB — GLUCOSE, CAPILLARY
Glucose-Capillary: 128 mg/dL — ABNORMAL HIGH (ref 70–99)
Glucose-Capillary: 110 mg/dL — ABNORMAL HIGH (ref 70–99)
Glucose-Capillary: 149 mg/dL — ABNORMAL HIGH (ref 70–99)
Glucose-Capillary: 91 mg/dL (ref 70–99)

## 2023-12-01 LAB — COMPREHENSIVE METABOLIC PANEL WITH GFR
ALT: 21 U/L (ref 0–44)
AST: 18 U/L (ref 15–41)
Albumin: 3.4 g/dL — ABNORMAL LOW (ref 3.5–5.0)
Alkaline Phosphatase: 46 U/L (ref 38–126)
Anion gap: 7 (ref 5–15)
BUN: 14 mg/dL (ref 8–23)
CO2: 24 mmol/L (ref 22–32)
Calcium: 7.6 mg/dL — ABNORMAL LOW (ref 8.9–10.3)
Chloride: 103 mmol/L (ref 98–111)
Creatinine, Ser: 0.94 mg/dL (ref 0.61–1.24)
GFR, Estimated: >60 mL/min (ref >60)
Glucose, Bld: 115 mg/dL — ABNORMAL HIGH (ref 70–99)
Potassium: 4 mmol/L (ref 3.5–5.1)
Sodium: 134 mmol/L — ABNORMAL LOW (ref 135–145)
Total Bilirubin: 1.3 mg/dL — ABNORMAL HIGH (ref 0.0–1.2)
Total Protein: 6.8 g/dL (ref 6.5–8.1)

## 2023-12-01 LAB — CBC
HCT: 49.8 % (ref 39.0–52.0)
Hemoglobin: 15.8 g/dL (ref 13.0–17.0)
MCH: 30.8 pg (ref 26.0–34.0)
MCHC: 31.7 g/dL (ref 30.0–36.0)
MCV: 97.1 fL (ref 80.0–100.0)
Platelets: 251 10*3/uL (ref 150–400)
RBC: 5.13 MIL/uL (ref 4.22–5.81)
RDW: 13.1 % (ref 11.5–15.5)
WBC: 12.1 10*3/uL — ABNORMAL HIGH (ref 4.0–10.5)
nRBC: 0 % (ref 0.0–0.2)

## 2023-12-01 MED ORDER — ONDANSETRON HCL 4 MG PO TABS: 4.0000 mg | ORAL_TABLET | Freq: Four times a day (QID) | ORAL | 0 refills | Status: DC | PRN

## 2023-12-01 MED ORDER — POLYETHYLENE GLYCOL 3350 17 G PO PACK
17.0000 g | PACK | Freq: Every day | ORAL | Status: DC
  Administered 2023-12-01: 17 g via ORAL
  Filled 2023-12-01: qty 1

## 2023-12-01 MED ORDER — SENNA 8.6 MG PO TABS
1.0000 | ORAL_TABLET | Freq: Once | ORAL | Status: AC
  Administered 2023-12-01: 8.6 mg via ORAL
  Filled 2023-12-01: qty 1

## 2023-12-01 NOTE — Discharge Summary (Signed)
 Physician Discharge Summary   Patient: Connor Becker. MRN: [id number] DOB: 01-19-1951  Admit date:     11/29/2023  Discharge date: 12/01/23  Discharge Physician: Connor Becker   PCP: Connor Curling, MD     Recommendations at discharge:  Follow up with PCP Dr. Elnita Becker in 1 week for [facility] ileus Please ensure patient is up to date on screening colonoscopy     Discharge Diagnoses: Principal Problem:   Ileus due to Mounjaro Active Problems:   Primary hypothyroidism   GERD   Type 2 diabetes mellitus with hyperglycemia (HCC)   Obstructive sleep apnea   Class 2 severe obesity with body mass index (BMI) of 35 to 39.9 with serious comorbidity (HCC)   Dyslipidemia   Atherosclerosis of aorta (HCC)   Polyneuropathy associated with underlying disease (HCC)   Hypophosphatemia   Hyperbilirubinemia   Polycythemia   Hypokalemia     Hospital Course: Connor y.o. M with obesity, DM, HLD, hypothyroidism, who presented with several days of abdominal discomfort, distention, burping, nausea, and vomiting.    CT abdomen/pelvis with contrast showed ileus versus less likely developing small bowel obstruction.  NG placed and admitted for further evaluation.     Likely Mounjaro related ileus Patient admitted with diffuse ileus in setting of Mounjaro dose adjustment.    From chart review, he has been relatively GLP-1 sensitive from a GI side effect standpoint throughout his experience with Ozempic  and Mounjaro.  This hospitalization appears to signal definitively that these medications are not for him.  There was some residual concern from General Surgery that his presentation was an actual SBO, but I find this unlikely.  After clamping of his tube, he promptly had a BM with Miralax, and tolerated quick advancement solid diet without bloating, pain or vomiting.      Morbid obesity BMI greater than 35 in the setting of diabetes, sleep apnea, and hyperlipidemia   Type 2  diabetes Defer to PCP regarding resumption of antidiabetics now that Connor Becker is stopped.              The [place]  Controlled Substances Registry was reviewed for this patient prior to discharge.   Consultants: General Surgery Procedures performed:  CT abdomen and pelvis SB protocol   Disposition: Home Diet recommendation:  Carb modified diet  DISCHARGE MEDICATION: Allergies as of 12/01/2023       Reactions   Horseradish [armoracia [place]icana Ext (horseradish)] Anaphylaxis   Other Anaphylaxis   Soy sauce only    Lipitor [atorvastatin ] Other (See Comments)   Leg cramps   Metformin  And Related Other (See Comments)   GI upset        Medication List     PAUSE taking these medications    glimepiride  2 MG tablet Wait to take this until your doctor or other care provider tells you to start again. Commonly known as: AMARYL  Take 2 mg by mouth daily with breakfast.       STOP taking these medications    Mounjaro 5 MG/0.5ML Pen Generic drug: tirzepatide       TAKE these medications    albuterol  108 (90 Base) MCG/ACT inhaler Commonly known as: VENTOLIN  HFA Inhale 2 puffs into the lungs every 6 (six) hours as needed for wheezing or shortness of breath.   azelastine  0.1 % nasal spray Commonly known as: ASTELIN  Place 2 sprays into both nostrils 2 (two) times daily. Use in each nostril as directed What changed:  how much to take when to  take this reasons to take this additional instructions   Benadryl  Allergy 25 MG tablet Generic drug: diphenhydrAMINE  Take 25 mg by mouth every 4 (four) hours as needed for allergies.   EPINEPHrine  0.3 mg/0.3 mL Soaj injection Commonly known as: EPI-PEN Inject 0.3 mg into the muscle as needed for anaphylaxis.   ezetimibe  10 MG tablet Commonly known as: ZETIA  Take 1 tablet by mouth once daily   fluticasone  50 MCG/ACT nasal spray Commonly known as: FLONASE  Place 2 sprays into both nostrils daily. What changed:   how much to take when to take this reasons to take this   FreeStyle Libre 3 Sensor Misc 1 Device by Does not apply route every 14 (fourteen) days. Place 1 sensor on the skin every 14 days. Use to check glucose continuously   ibuprofen 200 MG tablet Commonly known as: ADVIL Take 400-600 mg by mouth every 6 (six) hours as needed for mild pain (pain score 1-3) or moderate pain (pain score 4-6).   latanoprost 0.005 % ophthalmic solution Commonly known as: XALATAN Place 1 drop into both eyes daily.   levothyroxine  112 MCG tablet Commonly known as: SYNTHROID  Take 1 tablet (112 mcg total) by mouth daily.   lisinopril  5 MG tablet Commonly known as: ZESTRIL  Take 1 tablet by mouth once daily   ondansetron  4 MG tablet Commonly known as: ZOFRAN  Take 1 tablet (4 mg total) by mouth every 6 (six) hours as needed for nausea.   rosuvastatin  40 MG tablet Commonly known as: CRESTOR  Take 1 tablet (40 mg total) by mouth daily.   tadalafil  5 MG tablet Commonly known as: CIALIS  Take 5 mg by mouth daily.   Testosterone  1.62 % Gel Apply 2 pumps daily   timolol  0.5 % ophthalmic solution Commonly known as: TIMOPTIC  Place 1 drop into both eyes at bedtime.   UNABLE TO FIND Inject 1 Dose into the skin See admin instructions. Med Name: Testosterone  Pellets  Insert pellets every 4 months - unknown dose   Vitamin D  (Ergocalciferol ) 1.25 MG (50000 UNIT) Caps capsule Commonly known as: DRISDOL  Take 1 capsule (50,000 Units total) by mouth See admin instructions. Take one capsule by mouth on Tues/Fridays        Follow-up Information     Connor Curling, MD .   Specialty: Internal Medicine Contact information: [address] [place] [address] [phone]                 Discharge Instructions     Discharge instructions   Complete by: As directed    **IMPORTANT DISCHARGE INSTRUCTIONS**   From Dr. Darlyn Becker:  You were admitted for an ileus from  [facility] effects are long lasting, and will take days or more to wear off  You should eat bland foods in SMALL AMOUNTS for the next week  Do NOT eat large meals or you will likely have recurrent symptoms  Take MiraLAX twice daily for the next few days until you have a bowel movement  Also, if you have recurrent nausea, take ondansetron  4 mg up to three times daily  If you find that you go more than 6 hours and CANNOT EAT, return to the hospital  If you have ongoing pain, call your doctor for evaluation   Increase activity slowly   Complete by: As directed        Discharge Exam: Filed Weights   11/29/23 0919 11/29/23 0930  Weight: 136.1 kg 136.1 kg    General: Pt is alert,  awake, not in acute distress Cardiovascular: RRR, nl S1-S2, no murmurs appreciated.   No LE edema.   Respiratory: Normal respiratory rate and rhythm.  CTAB without rales or wheezes. Abdominal: Abdomen soft and non-tender.  No distension or HSM.   Neuro/Psych: Strength symmetric in upper and lower extremities.  Judgment and insight appear normal.   Condition at discharge: good  The results of significant diagnostics from this hospitalization (including imaging, microbiology, ancillary and laboratory) are listed below for reference.   Imaging Studies: DG Abd Portable 1V Result Date: 12/01/2023 CLINICAL DATA:  Connor year old male with recent abdominal pain, small-bowel obstruction versus ileus on CT. EXAM: PORTABLE ABDOMEN - 1 VIEW COMPARISON:  CT Abdomen and Pelvis 11/29/2023. Abdominal radiographs yesterday. FINDINGS: Portable AP supine views at 0751 hours. Stable enteric tube with side hole at the level of the gastric fundus. Gas distended small bowel loops in the mid abdomen. Increased right colon gas since the prior CT. Decompressed left colon, although some gas also in the sigmoid. Lung bases appear stable. Stable visualized osseous structures. IMPRESSION: 1. Stable enteric tube. 2. Bowel-gas  pattern not improved from recent CT Abdomen and Pelvis, now most compatible with ileus. Electronically Signed   By: Marlise Simpers M.D.   On: 12/01/2023 10:53   DG Abd Portable 1V-Small Bowel Obstruction Protocol-initial, 8 hr delay Result Date: 11/30/2023 CLINICAL DATA:  8 hour small-bowel follow up EXAM: PORTABLE ABDOMEN - 1 VIEW COMPARISON:  Film from earlier in the same day. FINDINGS: Scattered large and small bowel gas is noted. Persistent small bowel dilatation is seen. No free air is noted. No contrast is seen. No bony abnormality is noted. IMPRESSION: Small-bowel dilatation stable from the prior exam. No significant contrast is noted. Electronically Signed   By: Violeta Grey M.D.   On: 11/30/2023 21:55   DG Abd Portable 1V-Small Bowel Obstruction Protocol-initial, 8 hr delay Result Date: 11/30/2023 CLINICAL DATA:  Small-bowel obstruction. Reportedly this is the 8 hour delayed film. EXAM: PORTABLE ABDOMEN - 1 VIEW COMPARISON:  Flat plate abdomen yesterday at 4:33 p.m., CT yesterday at 11:10 a.m. FINDINGS: 4:20 a.m. If the previously-seen NGT is still in place, it is not included on either of the 2 films obtained for this exam. The prior study appeared to demonstrate a fair amount of diluted enteric contrast in the stomach. There is contrast in the bladder but there is no visible contrast in the bowel. Subject contrast is limited due to habitus. Widespread small bowel dilatation continues to be seen except in the right lower quadrant where there are normal caliber segments. Maximal small bowel caliber again is 5 cm consistent with intermediate to high-grade obstruction. Paucity of colonic aeration appears similar to the prior study and same day CT. There is no supine evidence of free air. There is advanced marginal osteophytosis of the lumbar spine. IMPRESSION: 1. No visible contrast in the bowel. Subject contrast is limited due to habitus. 2. Widespread small bowel dilatation continues to be seen except in the  right lower quadrant where there are normal caliber segments. Maximal small bowel caliber again is 5 cm consistent with intermediate to high-grade obstruction. 3. Paucity of colonic aeration appears similar to the prior study and same day CT. 4. No supine evidence of free air. 5. If the previously-seen NGT is still in place, it is not included on either of the 2 films obtained for this exam. Electronically Signed   By: Denman Fischer M.D.   On: 11/30/2023 05:32   DG  Abd Portable 1V-Small Bowel Protocol-Position Verification Result Date: 11/29/2023 CLINICAL DATA:  [id number] Encounter for imaging study to confirm nasogastric (NG) tube placement [id number]. EXAM: PORTABLE ABDOMEN - 1 VIEW COMPARISON:  None Available. FINDINGS: There is gaseous dilatation of small bowel loops, which is disproportionate to the degree of distention of the colon. Findings may represent small-bowel obstruction versus adynamic ileus. Please refer to same-day performed CT scan abdomen and pelvis report for details. No evidence of pneumoperitoneum. No acute osseous abnormalities. The soft tissues are within normal limits. Surgical changes, devices, tubes and lines: Enteric tube is seen coursing below the left hemidiaphragm with its tip and side hole overlying the left upper quadrant, overlying the stomach body region. IMPRESSION: *Enteric tube is seen coursing below the left hemidiaphragm with its tip and side hole overlying the stomach body region. Electronically Signed   By: Beula Brunswick M.D.   On: 11/29/2023 16:44   CT ABDOMEN PELVIS W CONTRAST Result Date: 11/29/2023 CLINICAL DATA:  Abdominal pain. EXAM: CT ABDOMEN AND PELVIS WITH CONTRAST TECHNIQUE: Multidetector CT imaging of the abdomen and pelvis was performed using the standard protocol following bolus administration of intravenous contrast. RADIATION DOSE REDUCTION: This exam was performed according to the departmental dose-optimization program which includes automated exposure  control, adjustment of the mA and/or kV according to patient size and/or use of iterative reconstruction technique. CONTRAST:  OMNIPAQUE  IOHEXOL  300 MG/ML  SOLN COMPARISON:  CT abdomen pelvis dated 03/18/2021. FINDINGS: Lower chest: The visualized lung bases are clear. No intra-abdominal free air or free fluid. Hepatobiliary: Probable fatty liver. No biliary dilatation. The gallbladder is unremarkable. Pancreas: Unremarkable. No pancreatic ductal dilatation or surrounding inflammatory changes. Spleen: Normal in size without focal abnormality. Adrenals/Urinary Tract: The adrenal glands unremarkable. There is no hydronephrosis on either side. There is symmetric enhancement and excretion of contrast by both kidneys. The visualized ureters and urinary bladder appear unremarkable. Stomach/Bowel: There is distal colonic diverticulosis. There is a 1.3 cm fatty lesion in the proximal transverse colon, likely a lipoma. The stomach is distended with fluid content. Multiple mildly dilated fluid-filled loops of small bowel measure up to approximately 5 cm in diameter. There is a gradual transition in the right lower quadrant. Findings favored to represent an ileus, although developing obstruction is not excluded. Small-bowel series may provide better evaluation. The appendix is not visualized with certainty. No inflammatory changes identified in the right lower quadrant. Vascular/Lymphatic: Mild aortoiliac atherosclerotic disease. The IVC is unremarkable. No portal venous gas. There is no adenopathy. Reproductive: The prostate and seminal vesicles are grossly unremarkable. Other: None Musculoskeletal: Osteopenia with degenerative changes of the spine. No acute osseous pathology. IMPRESSION: 1. Ileus versus less likely developing small-bowel obstruction. Small-bowel series may provide better evaluation. 2. Distal colonic diverticulosis. 3. Probable fatty liver. 4.  Aortic Atherosclerosis (ICD10-I70.0). Electronically  Signed   By: Angus Bark M.D.   On: 11/29/2023 11:34    Microbiology: Results for orders placed or performed in visit on 08/22/21  Novel Coronavirus, NAA ([facility])     Status: Abnormal   Collection Time: 08/22/21  2:24 PM   Specimen: Nasopharyngeal(NP) swabs in vial transport medium  Result Value Ref Range Status   SARS-CoV-2, NAA Detected (A) Not Detected Final    Comment: Patients who have a positive COVID-19 test result may now have treatment options. Treatment options are available for patients with mild to moderate symptoms and for hospitalized patients. Visit our website at [email or web address] for resources and information. This nucleic acid amplification test  was developed and its performance characteristics determined by [employer]. Nucleic acid amplification tests include RT-PCR and TMA. This test has not been FDA cleared or approved. This test has been authorized by FDA under an Emergency Use Authorization (EUA). This test is only authorized for the duration of time the declaration that circumstances exist justifying the authorization of the emergency use of in vitro diagnostic tests for detection of SARS-CoV-2 virus and/or diagnosis of COVID-19 infection under section 564(b)(1) of the Act, 21 U.S.C. 956OZH-0(Q) (1), unless the authorization is terminated or revoked sooner. When diagnostic testing is negativ e, the possibility of a false negative result should be considered in the context of a patient's recent exposures and the presence of clinical signs and symptoms consistent with COVID-19. An individual without symptoms of COVID-19 and who is not shedding SARS-CoV-2 virus would expect to have a negative (not detected) result in this assay.   SARS-COV-2, NAA 2 DAY TAT     Status: None   Collection Time: 08/22/21  2:24 PM  Result Value Ref Range Status   SARS-CoV-2, NAA 2 DAY TAT Performed  Final    Labs: CBC: Recent Labs  Lab  11/29/23 1000 11/30/23 0519 12/01/23 0451  WBC 16.0* 13.6* 12.1*  NEUTROABS 12.5*  --   --   HGB 18.0* 16.5 15.8  HCT 55.2* 52.6* 49.8  MCV 94.5 99.1 97.1  PLT 319 260 251   Basic Metabolic Panel: Recent Labs  Lab 11/29/23 1000 11/29/23 2216 11/30/23 0519 12/01/23 0451  NA 134* 135 136 134*  K 3.8 3.7 3.8 4.0  CL 100 102 104 103  CO2 21* 20* 25 24  GLUCOSE 152* 104* 114* 115*  BUN 18 17 16 14   CREATININE 1.25* 1.04 1.12 0.94  CALCIUM  8.5* 7.5* 7.7* 7.6*  MG 1.8  --   --   --   PHOS 2.2*  --   --   --    Liver Function Tests: Recent Labs  Lab 11/29/23 1000 11/30/23 0519 12/01/23 0451  AST 27 20 18   ALT 28 22 21   ALKPHOS 56 47 46  BILITOT 1.4* 1.3* 1.3*  PROT 8.2* 6.9 6.8  ALBUMIN 4.2 3.4* 3.4*   CBG: Recent Labs  Lab 11/30/23 2030 11/30/23 2349 12/01/23 0354 12/01/23 0739 12/01/23 1144  GLUCAP 93 109* 128* 110* 149*    Discharge time spent: approximately 45 minutes spent on discharge counseling, evaluation of patient on day of discharge, and coordination of discharge planning with nursing, social work, pharmacy and case management  Signed: Ephriam Hashimoto, MD [facility] 12/01/2023

## 2023-12-01 NOTE — Progress Notes (Signed)
 Radiology called to verify if they should continue with order for 24hr delay Xray since no contrast was evident in either films, provider contacted, made aware that contrast was not seen, per provider Radiology can proceed.

## 2023-12-01 NOTE — Progress Notes (Signed)
 Subjective/Chief Complaint: Patient reports that overnight he has had more flatus, but no BM.  Overnight he has had some decompression and some repeat bloating.  Feeling a little hungry.  Reports again that if he gets dehydrated that "this happens sometimes, just a little less severe."    His NGT was dark bilious/brown yesterday but has lightened up to a clear light brown color this AM.    Wants to "have [place] food, have a big blowout, and go home."    Objective: Vital signs in last 24 hours: Temp:  [98.1 F (36.7 C)-98.4 F (36.9 C)] 98.2 F (36.8 C) (05/18 0355) Pulse Rate:  [75-90] 85 (05/18 0355) Resp:  [17-18] 18 (05/18 0355) BP: (131-135)/(78-89) 134/89 (05/18 0355) SpO2:  [95 %] 95 % (05/18 0355) Last BM Date : 11/28/23  Intake/Output from previous day: 05/17 0701 - 05/18 0700 In: 1242.6 [I.V.:1242.6] Out: 2100 [Urine:850; Emesis/NG output:1250] Intake/Output this shift: No intake/output data recorded.  Gen:  A&O x 3, reading paper.   Breathing comfortably Abd:  protuberant, softer than yesterday, non tender    Lab Results:  Recent Labs    11/30/23 0519 12/01/23 0451  WBC 13.6* 12.1*  HGB 16.5 15.8  HCT 52.6* 49.8  PLT 260 251   BMET Recent Labs    11/30/23 0519 12/01/23 0451  NA 136 134*  K 3.8 4.0  CL 104 103  CO2 25 24  GLUCOSE 114* 115*  BUN 16 14  CREATININE 1.12 0.94  CALCIUM  7.7* 7.6*   PT/INR No results for input(s): "LABPROT", "INR" in the last 72 hours. ABG No results for input(s): "PHART", "HCO3" in the last 72 hours.  Invalid input(s): "PCO2", "PO2"  Studies/Results: DG Abd Portable 1V-Small Bowel Obstruction Protocol-initial, 8 hr delay Result Date: 11/30/2023 CLINICAL DATA:  8 hour small-bowel follow up EXAM: PORTABLE ABDOMEN - 1 VIEW COMPARISON:  Film from earlier in the same day. FINDINGS: Scattered large and small bowel gas is noted. Persistent small bowel dilatation is seen. No free air is noted. No contrast is seen. No  bony abnormality is noted. IMPRESSION: Small-bowel dilatation stable from the prior exam. No significant contrast is noted. Electronically Signed   By: Violeta Grey M.D.   On: 11/30/2023 21:55   DG Abd Portable 1V-Small Bowel Obstruction Protocol-initial, 8 hr delay Result Date: 11/30/2023 CLINICAL DATA:  Small-bowel obstruction. Reportedly this is the 8 hour delayed film. EXAM: PORTABLE ABDOMEN - 1 VIEW COMPARISON:  Flat plate abdomen yesterday at 4:33 p.m., CT yesterday at 11:10 a.m. FINDINGS: 4:20 a.m. If the previously-seen NGT is still in place, it is not included on either of the 2 films obtained for this exam. The prior study appeared to demonstrate a fair amount of diluted enteric contrast in the stomach. There is contrast in the bladder but there is no visible contrast in the bowel. Subject contrast is limited due to habitus. Widespread small bowel dilatation continues to be seen except in the right lower quadrant where there are normal caliber segments. Maximal small bowel caliber again is 5 cm consistent with intermediate to high-grade obstruction. Paucity of colonic aeration appears similar to the prior study and same day CT. There is no supine evidence of free air. There is advanced marginal osteophytosis of the lumbar spine. IMPRESSION: 1. No visible contrast in the bowel. Subject contrast is limited due to habitus. 2. Widespread small bowel dilatation continues to be seen except in the right lower quadrant where there are normal caliber segments. Maximal small  bowel caliber again is 5 cm consistent with intermediate to high-grade obstruction. 3. Paucity of colonic aeration appears similar to the prior study and same day CT. 4. No supine evidence of free air. 5. If the previously-seen NGT is still in place, it is not included on either of the 2 films obtained for this exam. Electronically Signed   By: Denman Fischer M.D.   On: 11/30/2023 05:32   DG Abd Portable 1V-Small Bowel Protocol-Position  Verification Result Date: 11/29/2023 CLINICAL DATA:  [id number] Encounter for imaging study to confirm nasogastric (NG) tube placement [id number]. EXAM: PORTABLE ABDOMEN - 1 VIEW COMPARISON:  None Available. FINDINGS: There is gaseous dilatation of small bowel loops, which is disproportionate to the degree of distention of the colon. Findings may represent small-bowel obstruction versus adynamic ileus. Please refer to same-day performed CT scan abdomen and pelvis report for details. No evidence of pneumoperitoneum. No acute osseous abnormalities. The soft tissues are within normal limits. Surgical changes, devices, tubes and lines: Enteric tube is seen coursing below the left hemidiaphragm with its tip and side hole overlying the left upper quadrant, overlying the stomach body region. IMPRESSION: *Enteric tube is seen coursing below the left hemidiaphragm with its tip and side hole overlying the stomach body region. Electronically Signed   By: Beula Brunswick M.D.   On: 11/29/2023 16:44   CT ABDOMEN PELVIS W CONTRAST Result Date: 11/29/2023 CLINICAL DATA:  Abdominal pain. EXAM: CT ABDOMEN AND PELVIS WITH CONTRAST TECHNIQUE: Multidetector CT imaging of the abdomen and pelvis was performed using the standard protocol following bolus administration of intravenous contrast. RADIATION DOSE REDUCTION: This exam was performed according to the departmental dose-optimization program which includes automated exposure control, adjustment of the mA and/or kV according to patient size and/or use of iterative reconstruction technique. CONTRAST:  OMNIPAQUE  IOHEXOL  300 MG/ML  SOLN COMPARISON:  CT abdomen pelvis dated 03/18/2021. FINDINGS: Lower chest: The visualized lung bases are clear. No intra-abdominal free air or free fluid. Hepatobiliary: Probable fatty liver. No biliary dilatation. The gallbladder is unremarkable. Pancreas: Unremarkable. No pancreatic ductal dilatation or surrounding inflammatory changes. Spleen: Normal in  size without focal abnormality. Adrenals/Urinary Tract: The adrenal glands unremarkable. There is no hydronephrosis on either side. There is symmetric enhancement and excretion of contrast by both kidneys. The visualized ureters and urinary bladder appear unremarkable. Stomach/Bowel: There is distal colonic diverticulosis. There is a 1.3 cm fatty lesion in the proximal transverse colon, likely a lipoma. The stomach is distended with fluid content. Multiple mildly dilated fluid-filled loops of small bowel measure up to approximately 5 cm in diameter. There is a gradual transition in the right lower quadrant. Findings favored to represent an ileus, although developing obstruction is not excluded. Small-bowel series may provide better evaluation. The appendix is not visualized with certainty. No inflammatory changes identified in the right lower quadrant. Vascular/Lymphatic: Mild aortoiliac atherosclerotic disease. The IVC is unremarkable. No portal venous gas. There is no adenopathy. Reproductive: The prostate and seminal vesicles are grossly unremarkable. Other: None Musculoskeletal: Osteopenia with degenerative changes of the spine. No acute osseous pathology. IMPRESSION: 1. Ileus versus less likely developing small-bowel obstruction. Small-bowel series may provide better evaluation. 2. Distal colonic diverticulosis. 3. Probable fatty liver. 4.  Aortic Atherosclerosis (ICD10-I70.0). Electronically Signed   By: Angus Bark M.D.   On: 11/29/2023 11:34    Anti-infectives: Anti-infectives (From admission, onward)    None       Assessment/Plan: SBO vs ileus. SBO protocol films repeated, but again were  unhelpful.  Gastrograffin either suctioned out or diluted too much.    More flatus yesterday and pain resolved.    Will try clamping ngt and doing miralax.    I discussed the different pathophysiology of a adhesive blockage versus other mechanical blockage versus motility issue.  I reviewed that in  the absence of previous abdominal surgery, that we approach his situation a bit differently.  I discussed that we do consider his risk of having a malignancy as a concern.  He reports that he feels a lot better and desires to go home.  I discussed that I strongly advise against this in the absence of a bowel movement.  We compromised in the clamping of the NG tube this morning and allowing clears with MiraLAX.  He was averse to having suppository or something from below.  Should he have a bowel movement with the MiraLAX and the clears, he would like to go home today.  Again I discussed the potential for malignancy in the setting of no prior surgery.  I do think it is a possibility that he could have a motility issue due to his GLP-1 use, but I am not convinced at this point that he does not have something else going on.  However he is improving rapidly and desires to leave the hospital, so if he has a BM I think that is adequate for discharge.     LOS: 1 day    Lockie Rima 12/01/2023

## 2023-12-01 NOTE — Plan of Care (Signed)

## 2023-12-02 ENCOUNTER — Telehealth: Payer: Self-pay

## 2023-12-02 ENCOUNTER — Telehealth: Payer: Self-pay | Admitting: *Deleted

## 2023-12-02 NOTE — Transitions of Care (Post Inpatient/ED Visit) (Signed)
   12/02/2023  Name: Connor Becker. MRN: [id number] DOB: [birth year]  Today's TOC FU Call Status: Today's TOC FU Call Status:: Unsuccessful Call (1st Attempt) Unsuccessful Call (1st Attempt) Date: 12/02/23  Attempted to reach the patient regarding the most recent Inpatient/ED visit.  Follow Up Plan: Additional outreach attempts will be made to reach the patient to complete the Transitions of Care (Post Inpatient/ED visit) call.   Arna Better RN, BSN [place]  [facility] RN Care Manager [phone]

## 2023-12-02 NOTE — Transitions of Care (Post Inpatient/ED Visit) (Signed)
   12/02/2023  Name: Sahith Nurse. MRN: [id number] DOB: 02-24-1951  Today's TOC FU Call Status: Today's TOC FU Call Status:: Unsuccessful Call (1st Attempt) Unsuccessful Call (1st Attempt) Date: 12/02/23  Attempted to reach the patient regarding the most recent Inpatient/ED visit.  Follow Up Plan: Additional outreach attempts will be made to reach the patient to complete the Transitions of Care (Post Inpatient/ED visit) call.   Signature YL,RMA

## 2023-12-03 ENCOUNTER — Telehealth: Payer: Self-pay | Admitting: *Deleted

## 2023-12-03 NOTE — Transitions of Care (Post Inpatient/ED Visit) (Signed)
   12/03/2023  Name: Connor Becker. MRN: [id number] DOB: [birth year]  Today's TOC FU Call Status: Today's TOC FU Call Status:: Unsuccessful Call (2nd Attempt) Unsuccessful Call (2nd Attempt) Date: 12/03/23  Attempted to reach the patient regarding the most recent Inpatient/ED visit.  Follow Up Plan: Additional outreach attempts will be made to reach the patient to complete the Transitions of Care (Post Inpatient/ED visit) call.   Arna Better RN, BSN [place]  [facility] RN Care Manager [phone]

## 2023-12-04 ENCOUNTER — Telehealth: Payer: Self-pay | Admitting: *Deleted

## 2023-12-04 NOTE — Transitions of Care (Post Inpatient/ED Visit) (Signed)
   12/04/2023  Name: Connor Becker. MRN: [id number] DOB: [birth year]  Today's TOC FU Call Status: Today's TOC FU Call Status:: Unsuccessful Call (3rd Attempt) Unsuccessful Call (3rd Attempt) Date: 12/04/23  Attempted to reach the patient regarding the most recent Inpatient/ED visit.  Follow Up Plan: No further outreach attempts will be made at this time. We have been unable to contact the patient.  Arna Better RN, BSN [place]  [facility] RN Care Manager [phone]

## 2023-12-05 ENCOUNTER — Encounter: Payer: Self-pay | Admitting: Internal Medicine

## 2023-12-11 ENCOUNTER — Ambulatory Visit (INDEPENDENT_AMBULATORY_CARE_PROVIDER_SITE_OTHER): Payer: Self-pay | Admitting: Internal Medicine

## 2023-12-11 ENCOUNTER — Encounter: Payer: Self-pay | Admitting: Internal Medicine

## 2023-12-11 VITALS — BP 116/66 | HR 95 | Temp 98.3°F | Ht 74.0 in | Wt 296.6 lb

## 2023-12-11 DIAGNOSIS — Z6838 Body mass index (BMI) 38.0-38.9, adult: Secondary | ICD-10-CM

## 2023-12-11 DIAGNOSIS — E785 Hyperlipidemia, unspecified: Secondary | ICD-10-CM

## 2023-12-11 DIAGNOSIS — K567 Ileus, unspecified: Secondary | ICD-10-CM | POA: Diagnosis not present

## 2023-12-11 DIAGNOSIS — G4733 Obstructive sleep apnea (adult) (pediatric): Secondary | ICD-10-CM

## 2023-12-11 DIAGNOSIS — K219 Gastro-esophageal reflux disease without esophagitis: Secondary | ICD-10-CM

## 2023-12-11 DIAGNOSIS — E66812 Obesity, class 2: Secondary | ICD-10-CM

## 2023-12-11 DIAGNOSIS — E1169 Type 2 diabetes mellitus with other specified complication: Secondary | ICD-10-CM | POA: Diagnosis not present

## 2023-12-11 DIAGNOSIS — E039 Hypothyroidism, unspecified: Secondary | ICD-10-CM

## 2023-12-11 DIAGNOSIS — I951 Orthostatic hypotension: Secondary | ICD-10-CM

## 2023-12-11 DIAGNOSIS — I700 Atherosclerosis of aorta: Secondary | ICD-10-CM

## 2023-12-11 NOTE — Progress Notes (Signed)
 I,Victoria T Basil Lim, CMA,acting as a Neurosurgeon for Connor Dung, MD.,have documented all relevant documentation on the behalf of Connor Dung, MD,as directed by  Connor Dung, MD while in the presence of Connor Dung, MD.  Subjective:  Patient ID: Connor Becker , male    DOB: 1950/12/18 , 73 y.o.   MRN: [id number]  Chief Complaint  Patient presents with   Hospitalization Follow-up    Patient presents today for hospital follow up. He went to [facility] on 5/16 & discharged on 5/18 for abdominal destination. Today he reports feeling better. He has been discontinued off of GLP 1 medications. He has noticed since being off of Mounjaro his blood sugar readings have improved tremendously. He wants to know If it will be a good idea to continue this rate without medications. If not, he is open to trying Jardiance.     HPI  Follow up Hospitalization  Patient was admitted to [facility] on 11/29/23 and discharged on 12/01/23. He was treated for Abdominal distension, nausea/vomiting due to suspected ileus.  Treatment for this included IV fluids and NG tube Telephone follow up was done on 12/02/2023 He reports good compliance with treatment. He reports this condition is resolved.  ----------------------------------------------------------------------------------------- -     Discussed the use of AI scribe software for clinical note transcription with the patient, who gave verbal consent to proceed.  History of Present Illness [name]. "Connor Becker" is a 73 year old male with diabetes who presents for a hospital follow-up after experiencing gastrointestinal symptoms.  He was admitted to the hospital on May 16th and discharged on May 18th due to nausea, vomiting, and bloating. During his hospital stay, he received IV fluids for dehydration and had an NG tube placed for two days, which he found uncomfortable. This was his first experience with an NG tube.  He attributes his  gastrointestinal symptoms to the medication Mounjaro, which he started as an alternative treatment for his diabetes after experiencing adverse effects with Ozempic , such as constant diarrhea. With Mounjaro, he experienced constipation. Despite these side effects, he is pleased with the weight loss results since starting the medication in February. He has been taking probiotics and a pill form of a laxative, but not Miralax  powder. He has stopped taking Mounjaro and glimepiride . His blood sugar levels have increased since stopping Mounjaro, with his last injection being two weeks ago.  His current medications include albuterol  as needed, Astelin  nasal spray, EpiPen  as needed, Zetia , Flonase  nasal spray, levothyroxine , lisinopril , rosuvastatin , testosterone , and tadalafil .  He maintains a diet consisting of proteins like beef, chicken, and pork, along with snacks such as celery and carrots. He drinks a significant amount of water daily. He reports having bowel movements every three days, which are minimal, and attributes this to not eating large solid meals. He has not had any significant bowel movements since his hospital discharge.  He had an upper and lower colonoscopy three years ago, which did not reveal any blockages. He is not due for another colonoscopy for a couple of years.     Past Medical History:  Diagnosis Date   ALLERGIC RHINITIS    Chronic pain of both knees    DJD (degenerative joint disease) of knee    right   DM2 (diabetes mellitus, type 2) (HCC)    GERD    Glaucoma    History of nuclear stress test    Myoview  12/17: EF 53, no ischemia, Low Risk  HYPERLIPIDEMIA    myalgias from Lipitor   HYPOGONADISM, MALE    Hypothyroidism    INSOMNIA-SLEEP DISORDER-UNSPEC    Joint pain    Nausea, vomiting and diarrhea 11/29/2023   Obesity    OSTEOARTHRITIS    SLEEP APNEA, OBSTRUCTIVE    no CPAP; repeat sleep test pending 07/2016   Unspecified hypothyroidism 12/04/2013   Vitamin D   deficiency      Family History  Problem Relation Age of Onset   Heart attack Mother 79       s/p MI   Other Mother        pacemaker   Diabetes Mother    High blood pressure Mother    High Cholesterol Mother    Thyroid  disease Mother    Obesity Mother    Macular degeneration Father    Thyroid  disease Father    Diabetes Maternal Uncle    Heart attack Maternal Uncle    Hypertension Other    Prostate cancer Other    Heart disease Other    Colon cancer Neg Hx      Current Outpatient Medications:    azelastine  (ASTELIN ) 0.1 % nasal spray, Place 2 sprays into both nostrils 2 (two) times daily. Use in each nostril as directed (Patient taking differently: Place 1 spray into both nostrils daily as needed for allergies.), Disp: 30 mL, Rfl: 2   diphenhydrAMINE  (BENADRYL  ALLERGY) 25 MG tablet, Take 25 mg by mouth every 4 (four) hours as needed for allergies., Disp: , Rfl:    ezetimibe  (ZETIA ) 10 MG tablet, Take 1 tablet by mouth once daily, Disp: 90 tablet, Rfl: 0   fluticasone  (FLONASE ) 50 MCG/ACT nasal spray, Place 2 sprays into both nostrils daily. (Patient taking differently: Place 1 spray into both nostrils daily as needed for allergies.), Disp: 16 g, Rfl: 0   ibuprofen (ADVIL) 200 MG tablet, Take 400-600 mg by mouth every 6 (six) hours as needed for mild pain (pain score 1-3) or moderate pain (pain score 4-6)., Disp: , Rfl:    latanoprost (XALATAN) 0.005 % ophthalmic solution, Place 1 drop into both eyes daily., Disp: , Rfl:    levothyroxine  (SYNTHROID ) 112 MCG tablet, Take 1 tablet (112 mcg total) by mouth daily., Disp: 90 tablet, Rfl: 2   lisinopril  (ZESTRIL ) 5 MG tablet, Take 1 tablet by mouth once daily, Disp: 90 tablet, Rfl: 0   ondansetron  (ZOFRAN ) 4 MG tablet, Take 1 tablet (4 mg total) by mouth every 6 (six) hours as needed for nausea., Disp: 20 tablet, Rfl: 0   tadalafil  (CIALIS ) 5 MG tablet, Take 5 mg by mouth daily., Disp: , Rfl: 11   Testosterone  1.62 % GEL, Apply 2 pumps  daily, Disp: , Rfl:    timolol  (TIMOPTIC ) 0.5 % ophthalmic solution, Place 1 drop into both eyes at bedtime., Disp: , Rfl: 2   UNABLE TO FIND, Inject 1 Dose into the skin See admin instructions. Med Name: Testosterone  Pellets  Insert pellets every 4 months - unknown dose, Disp: , Rfl:    albuterol  (VENTOLIN  HFA) 108 (90 Base) MCG/ACT inhaler, Inhale 2 puffs into the lungs every 6 (six) hours as needed for wheezing or shortness of breath. (Patient not taking: Reported on 08/13/2023), Disp: 8 g, Rfl: 2   Continuous Glucose Sensor (FREESTYLE LIBRE 3 SENSOR) MISC, 1 Device by Does not apply route every 14 (fourteen) days. Place 1 sensor on the skin every 14 days. Use to check glucose continuously, Disp: 6 each, Rfl: 3   EPINEPHrine  0.3  mg/0.3 mL IJ SOAJ injection, Inject 0.3 mg into the muscle as needed for anaphylaxis. (Patient not taking: Reported on 11/29/2023), Disp: 1 each, Rfl: 0   [Paused] glimepiride  (AMARYL ) 2 MG tablet, Take 2 mg by mouth daily with breakfast. (Patient not taking: Reported on 11/29/2023), Disp: , Rfl:    rosuvastatin  (CRESTOR ) 40 MG tablet, Take 1 tablet (40 mg total) by mouth daily., Disp: 90 tablet, Rfl: 3   Vitamin D , Ergocalciferol , (DRISDOL ) 1.25 MG (50000 UNIT) CAPS capsule, Take 1 capsule (50,000 Units total) by mouth See admin instructions. Take one capsule by mouth on Tues/Fridays (Patient not taking: Reported on 11/13/2023), Disp: 24 capsule, Rfl: 0   Allergies  Allergen Reactions   Horseradish [Armoracia Rusticana Ext (Horseradish)] Anaphylaxis   Other Anaphylaxis    Soy sauce only    Lipitor [Atorvastatin ] Other (See Comments)    Leg cramps   Metformin  And Related Other (See Comments)    GI upset     Review of Systems  Constitutional: Negative.   HENT: Negative.    Respiratory: Negative.    Cardiovascular: Negative.   Gastrointestinal: Negative.   Skin: Negative.   Allergic/Immunologic: Negative.   Neurological: Negative.   Hematological: Negative.       Today's Vitals   12/11/23 1451  BP: 116/66  Pulse: 95  Temp: 98.3 F (36.8 C)  SpO2: 98%  Weight: 296 lb 9.6 oz (134.5 kg)  Height: 6\' 2"  (1.88 m)   Body mass index is 38.08 kg/m.  Wt Readings from Last 3 Encounters:  12/11/23 296 lb 9.6 oz (134.5 kg)  11/29/23 300 lb (136.1 kg)  11/13/23 (!) 302 lb (137 kg)     Objective:  Physical Exam Vitals and nursing note reviewed.  Constitutional:      Appearance: Normal appearance. He is obese.  HENT:     Head: Normocephalic and atraumatic.  Eyes:     Extraocular Movements: Extraocular movements intact.  Cardiovascular:     Rate and Rhythm: Normal rate and regular rhythm.     Heart sounds: Normal heart sounds.  Pulmonary:     Effort: Pulmonary effort is normal.     Breath sounds: Normal breath sounds.  Musculoskeletal:     Cervical back: Normal range of motion.  Skin:    General: Skin is warm.  Neurological:     General: No focal deficit present.     Mental Status: He is alert.  Psychiatric:        Mood and Affect: Mood normal.         Assessment And Plan:  Ileus (HCC) Assessment & Plan: TCM PERFORMED. A MEMBER OF THE CLINICAL TEAM SPOKE WITH THE PATIENT UPON DISCHARGE. DISCHARGE SUMMARY WAS REVIEWED IN FULL DETAIL DURING THE VISIT. MEDS RECONCILED AND COMPARED TO DISCHARGE MEDS. MEDICATION LIST WAS UPDATED AND REVIEWED WITH THE PATIENT. GREATER THAN 50% FACE TO FACE TIME WAS SPENT IN COUNSELING AND COORDINATION OF CARE. ALL QUESTIONS WERE ANSWERED TO THE SATISFACTION OF THE PATIENT.  - CT scan indicated ileus vs. small bowel obstruction, likely related to Mounjaro. No obstruction on imaging, but symptoms persist. Discussed mild laxatives or stool softeners for symptom management. - Notify me ASAP if abdominal sx recur    Hyperlipidemia due to type 2 diabetes mellitus (HCC) Assessment & Plan: Recent hospitalization due to GLP-1 receptor agonist adverse effects. Current treatment with Mounjaro discontinued due to  constipation and ileus. Elevated blood glucose since discontinuation. Discussed transition to SGLT2 inhibitor to manage diabetes and reduce cardiac/kidney  risk. Emphasized hydration to prevent kidney issues. - Start Jardiance with samples provided, or Farxiga  if Jardiance samples unavailable. - Monitor kidney function today. - Reassess in four weeks to evaluate tolerance and effectiveness of Jardiance. - Avoid glimepiride  until kidney function is assessed. - If blood glucose levels become too high, take half of the glimepiride .  Orders: -     Basic metabolic panel with GFR  Atherosclerosis of aorta (HCC) Assessment & Plan: Chronic, LDL goal < 70.  He will continue with rosuvastatin  40mg  daily. He is encouraged to follow a heart healthy lifestyle.    Gastroesophageal reflux disease without esophagitis Assessment & Plan: Chronic, reminded to stop eating 3 hours prior to lying down - Avoid known triggers    Class 2 severe obesity due to excess calories with serious comorbidity and body mass index (BMI) of 38.0 to 38.9 in adult [facility]) Assessment & Plan: BMI 38. He is encouraged to initially strive for BMI less than 30 to decrease cardiac risk. He is advised to exercise no less than 150 minutes per week.  He was congratulated on his efforts on making necessary dietary changes.       Return if symptoms worsen or fail to improve.  Patient was given opportunity to ask questions. Patient verbalized understanding of the plan and was able to repeat key elements of the plan. All questions were answered to their satisfaction.    I, Connor Dung, MD, have reviewed all documentation for this visit. The documentation on 12/11/23 for the exam, diagnosis, procedures, and orders are all accurate and complete.   IF YOU HAVE BEEN REFERRED TO A SPECIALIST, IT MAY TAKE 1-2 WEEKS TO SCHEDULE/PROCESS THE REFERRAL. IF YOU HAVE NOT HEARD FROM US /SPECIALIST IN TWO WEEKS, PLEASE GIVE US  A CALL AT  [phone] X [phone].   THE PATIENT IS ENCOURAGED TO PRACTICE SOCIAL DISTANCING DUE TO THE COVID-19 PANDEMIC.

## 2023-12-12 ENCOUNTER — Other Ambulatory Visit: Payer: Self-pay

## 2023-12-12 ENCOUNTER — Ambulatory Visit: Payer: Self-pay | Admitting: Internal Medicine

## 2023-12-12 LAB — BASIC METABOLIC PANEL WITH GFR
BUN/Creatinine Ratio: 10 (ref 10–24)
BUN: 12 mg/dL (ref 8–27)
CO2: 21 mmol/L (ref 20–29)
Calcium: 8.9 mg/dL (ref 8.6–10.2)
Chloride: 102 mmol/L (ref 96–106)
Creatinine, Ser: 1.19 mg/dL (ref 0.76–1.27)
Glucose: 89 mg/dL (ref 70–99)
Potassium: 4.6 mmol/L (ref 3.5–5.2)
Sodium: 137 mmol/L (ref 134–144)
eGFR: 64 mL/min/{1.73_m2} (ref >59)

## 2023-12-12 MED ORDER — FREESTYLE LIBRE 3 SENSOR MISC: 1.0000 | 3 refills | Status: DC

## 2023-12-18 ENCOUNTER — Encounter: Admitting: Internal Medicine

## 2023-12-19 ENCOUNTER — Encounter: Payer: Self-pay | Admitting: Internal Medicine

## 2023-12-19 NOTE — Patient Instructions (Signed)
 Empagliflozin Tablets What is this medication? EMPAGLIFLOZIN (EM pa gli FLOE zin) treats type 2 diabetes. It works by helping your kidneys remove sugar (glucose) from your blood through the urine, which decreases your blood sugar. It may also be used to lower the risk of worsening disease and death caused by kidney disease and heart failure. It works by helping your kidneys remove salt (sodium) from your blood through the urine. This decreases the amount of work the kidneys and heart have to do. Changes to diet and exercise are often combined with this medication. This medicine may be used for other purposes; ask your health care provider or pharmacist if you have questions. COMMON BRAND NAME(S): Jardiance What should I tell my care team before I take this medication? They need to know if you have any of these conditions: Change in diet, eating less Changes to your insulin dose Dehydration Diet low in salt Frequently drink alcohol Having surgery History of amputation History of diabetic ketoacidosis (DKA) History of foot sores caused by diabetes History of genital infections History of pancreatitis or pancreas problems History of urinary tract infections (UTI) Kidney disease Liver disease Nerve condition that causes pain, tingling, or numbness in the hands or feet Peripheral vascular disease, narrowing of the blood vessels Serious infection Trouble passing urine Type 1 diabetes An unusual or allergic reaction to empagliflozin, other medications, foods, dyes, or preservatives Pregnant or trying to get pregnant Breastfeeding How should I use this medication? Take this medication by mouth with water. Take it as directed on the prescription label at the same time every day. You may take it with or without food. Keep taking it unless your care team tells you to stop. A special MedGuide will be given to you by the pharmacist with each prescription and refill. Be sure to read this information  carefully each time. Talk to your care team about the use of this medication in children. While it may be prescribed for children as young as 10 years for selected conditions, precautions do apply. Overdosage: If you think you have taken too much of this medicine contact a poison control center or emergency room at once. NOTE: This medicine is only for you. Do not share this medicine with others. What if I miss a dose? If you miss a dose, take it as soon as you can. If it is almost time for your next dose, take only that dose. Do not take double or extra doses. What may interact with this medication? Alcohol Diuretics Insulin Lithium This list may not describe all possible interactions. Give your health care provider a list of all the medicines, herbs, non-prescription drugs, or dietary supplements you use. Also tell them if you smoke, drink alcohol, or use illegal drugs. Some items may interact with your medicine. What should I watch for while using this medication? Visit your care team for regular checks on your progress. Tell your care team if your symptoms do not start to get better or if they get worse. You may need blood work done while you are taking this medication. If you have diabetes, your care team will monitor your HbA1C (A1C). This test shows what your average blood sugar (glucose) level was over the past 2 to 3 months. This medication may increase the risk of diabetic ketoacidosis (DKA), a serious condition in which high ketone levels make your blood too acidic. Your care team may ask you to check for ketones in your urine or blood while you are taking  this medication. If you develop nausea, vomiting, stomach pain, unusual weakness or fatigue, or trouble breathing, stop taking this medication and call your care team right away. Check with your care team if you have severe diarrhea, nausea, and vomiting, or if you sweat a lot. The loss of too much body fluid may make it dangerous for  you to take this medication. Know the symptoms of low blood sugar and know how to treat it. Always carry a source of quick sugar with you. Examples include hard sugar candy or glucose tablets. Make sure others know that you can choke if you eat or drink if your blood sugar is too low and you are unable to care for yourself. Get medical help at once. Tell your care team if you have high blood sugar. Your medication dose may change if your body is under stress. Some types of stress that may affect your blood sugar include fever, infection, and surgery. If you have diabetes, you may get a false-positive result for sugar in your urine while you are taking this medication. Check with your care team. What side effects may I notice from receiving this medication? Side effects that you should report to your care team as soon as possible: Allergic reactions--skin rash, itching, hives, swelling of the face, lips, tongue, or throat Dehydration--increased thirst, dry mouth, feeling faint or lightheaded, headache, dark yellow or brown urine Diabetic ketoacidosis (DKA)--increased thirst or amount of urine, dry mouth, fatigue, fruity odor to breath, trouble breathing, stomach pain, nausea, vomiting Genital yeast infection--redness, swelling, pain, or itchiness, odor, thick or lumpy discharge New pain or tenderness, change in skin color, sores or ulcers, infection of the leg or foot Infection or redness, swelling, tenderness, or pain in the genitals, or area from the genitals to the back of the rectum Urinary tract infection (UTI)--burning when passing urine, passing frequent small amounts of urine, bloody or cloudy urine, pain in the lower back or sides This list may not describe all possible side effects. Call your doctor for medical advice about side effects. You may report side effects to FDA at 1-800-FDA-1088. Where should I keep my medication? Keep out of the reach of children and pets. Store at room  temperature between 20 and 25 degrees C (68 and 77 degrees F). Get rid of any unused medication after the expiration date. To get rid of medications that are no longer needed or have expired: Take the medication to a medication take-back program. Check with your pharmacy or law enforcement to find a location. If you cannot return the medication, check the label or package insert to see if the medication should be thrown out in the garbage or flushed down the toilet. If you are not sure, ask your care team. If it is safe to put it in the trash, take the medication out of the container. Mix the medication with cat litter, dirt, coffee grounds, or other unwanted substance. Seal the mixture in a bag or container. Put it in the trash. NOTE: This sheet is a summary. It may not cover all possible information. If you have questions about this medicine, talk to your doctor, pharmacist, or health care provider.  2024 Elsevier/Gold Standard (2023-06-14 00:00:00)

## 2023-12-19 NOTE — Assessment & Plan Note (Signed)
 Chronic, reminded to stop eating 3 hours prior to lying down - Avoid known triggers

## 2023-12-19 NOTE — Assessment & Plan Note (Signed)
 BMI 38. He is encouraged to initially strive for BMI less than 30 to decrease cardiac risk. He is advised to exercise no less than 150 minutes per week.  He was congratulated on his efforts on making necessary dietary changes.

## 2023-12-19 NOTE — Assessment & Plan Note (Signed)
 TCM PERFORMED. A MEMBER OF THE CLINICAL TEAM SPOKE WITH THE PATIENT UPON DISCHARGE. DISCHARGE SUMMARY WAS REVIEWED IN FULL DETAIL DURING THE VISIT. MEDS RECONCILED AND COMPARED TO DISCHARGE MEDS. MEDICATION LIST WAS UPDATED AND REVIEWED WITH THE PATIENT. GREATER THAN 50% FACE TO FACE TIME WAS SPENT IN COUNSELING AND COORDINATION OF CARE. ALL QUESTIONS WERE ANSWERED TO THE SATISFACTION OF THE PATIENT.  - CT scan indicated ileus vs. small bowel obstruction, likely related to Mounjaro. No obstruction on imaging, but symptoms persist. Discussed mild laxatives or stool softeners for symptom management. - Notify me ASAP if abdominal sx recur

## 2023-12-19 NOTE — Assessment & Plan Note (Signed)
 Recent hospitalization due to GLP-1 receptor agonist adverse effects. Current treatment with Mounjaro discontinued due to constipation and ileus. Elevated blood glucose since discontinuation. Discussed transition to SGLT2 inhibitor to manage diabetes and reduce cardiac/kidney risk. Emphasized hydration to prevent kidney issues. - Start Jardiance with samples provided, or Farxiga  if Jardiance samples unavailable. - Monitor kidney function today. - Reassess in four weeks to evaluate tolerance and effectiveness of Jardiance. - Avoid glimepiride  until kidney function is assessed. - If blood glucose levels become too high, take half of the glimepiride .

## 2023-12-19 NOTE — Assessment & Plan Note (Signed)
Chronic, LDL goal < 70.  He will continue with rosuvastatin 40mg  daily. He is encouraged to follow a heart healthy lifestyle.

## 2023-12-23 ENCOUNTER — Other Ambulatory Visit: Payer: Self-pay

## 2023-12-23 MED ORDER — FREESTYLE LIBRE 3 PLUS SENSOR MISC: 2 refills | Status: DC

## 2023-12-30 ENCOUNTER — Encounter: Payer: Self-pay | Admitting: Internal Medicine

## 2023-12-30 ENCOUNTER — Other Ambulatory Visit: Payer: Self-pay

## 2023-12-30 MED ORDER — FLUCONAZOLE 150 MG PO TABS: ORAL_TABLET | ORAL | 0 refills | Status: DC

## 2023-12-30 NOTE — Telephone Encounter (Signed)
 Please advise patient to decrease sugar intake and to stay well hydrated. Please also send fluconazole 150mg  po today, repeat in 48 hours. #2/0 refills.   Also, confirm name of OTC cream he used.  RS

## 2024-01-01 ENCOUNTER — Telehealth: Payer: Self-pay | Admitting: Pharmacist

## 2024-01-01 DIAGNOSIS — E1142 Type 2 diabetes mellitus with diabetic polyneuropathy: Secondary | ICD-10-CM

## 2024-01-01 NOTE — Progress Notes (Unsigned)
 01/01/2024 Name: Garet Hooton. MRN: [id number] DOB: 12-18-1950  Chief Complaint  Patient presents with   Medication Management    Diabetes     Zalen Sequeira. is a 73 y.o. year old male who presented for a telephone visit.   They were referred to the pharmacist by their PCP for assistance in managing diabetes.    Subjective: Lynwood Rolfe, Mickey. Is a 73 year old male being called today to follow up on diabetes.  He has multiple medical conditions including but not limited to: type 2 diabetes, GERD, glaucoma, hyperlipidemia, male hypogonadism, hypothyroidism, sleep apnea not on CPAP, obesity and vitamin D  deficiency .  He was recently hospitalized after increasing his Mounjaro  dose from 2.5 mg to 5 mg. Mounjaro  was discontinued during his hospitalization.  Care Team: Primary Care Provider: Jarold Medici, MD ;   01/15/24  Medication Access/Adherence  Current Pharmacy:  [facility] [address] [place] - [address] [place] [address] Phone: [phone] Fax: [phone]   Patient reports affordability concerns with their medications: No  Patient reports access/transportation concerns to their pharmacy: No  Patient reports adherence concerns with their medications:  No      Diabetes:  Current medications:     Current glucose readings: *** Using *** meter; testing *** times daily  Date of Download: *** % Time CGM is active: ***% Average Glucose: *** mg/dL Glucose Management Indicator: ***  Glucose Variability: *** (goal <36%) Time in Goal:  - Time in range 70-180: ***% - Time above range: ***% - Time below range: ***% Observed patterns:  Patient {Actions; denies-reports:120008} hypoglycemic s/sx including ***dizziness, shakiness, sweating. Patient {Actions; denies-reports:120008} hyperglycemic symptoms including ***polyuria, polydipsia, polyphagia, nocturia, neuropathy, blurred vision.  Current meal patterns:   - Breakfast: *** - Lunch *** - Supper *** - Snacks *** - Drinks ***  Current physical activity: ***  Current medication access support: ***   Objective:  Lab Results  Component Value Date   HGBA1C 9.8 (H) 10/03/2023    Lab Results  Component Value Date   CREATININE 1.19 12/11/2023   BUN 12 12/11/2023   NA 137 12/11/2023   K 4.6 12/11/2023   CL 102 12/11/2023   CO2 21 12/11/2023    Lab Results  Component Value Date   CHOL 102 05/08/2023   HDL 37 (L) 05/08/2023   LDLCALC 44 05/08/2023   LDLDIRECT 148.0 11/17/2015   TRIG 114 05/08/2023   CHOLHDL 2.8 05/08/2023    Medications Reviewed Today     Reviewed by Jolee Cassius PARAS, RPH (Pharmacist) on 01/01/24 at 1624  Med List Status: <None>   Medication Order Taking? Sig Documenting Provider Last Dose Status Informant  albuterol  (VENTOLIN  HFA) 108 (90 Base) MCG/ACT inhaler [id number] Yes Inhale 2 puffs into the lungs every 6 (six) hours as needed for wheezing or shortness of breath. Jarold Medici, MD  Active Self, Spouse/Significant Other, Pharmacy Records  azelastine  (ASTELIN ) 0.1 % nasal spray [id number] Yes Place 2 sprays into both nostrils 2 (two) times daily. Use in each nostril as directed Jarold Medici, MD  Active Self, Spouse/Significant Other, Pharmacy Records  Continuous Glucose Sensor (FREESTYLE LIBRE 3 PLUS SENSOR) MISC [id number] Yes 1 Device by Does not apply route every 15 (fifteen) days. Place 1 sensor on the skin every 15 days. Use to check glucose continuously Jarold Medici, MD  Active   diphenhydrAMINE  (BENADRYL  ALLERGY) 25 MG tablet [id number] Yes Take 25 mg by mouth every  4 (four) hours as needed for allergies. [provider]  Active Self, Spouse/Significant Other, Pharmacy Records  empagliflozin (JARDIANCE) 10 MG TABS tablet [id number] Yes Take 1 tablet by mouth daily. [provider]  Active   EPINEPHrine  0.3 mg/0.3 mL IJ SOAJ injection [id number] Yes Inject 0.3 mg into the muscle as needed  for anaphylaxis. Ghumman, Ramandeep, NP  Active Self, Spouse/Significant Other, Pharmacy Records           Med Note (CRUTHIS, CHLOE C   Fri Nov 29, 2023  1:23 PM)    ezetimibe  (ZETIA ) 10 MG tablet [id number] Yes Take 1 tablet by mouth once daily Jarold Medici, MD  Active Self, Spouse/Significant Other, Pharmacy Records  fluconazole (DIFLUCAN) 150 MG tablet [id number] Yes Take one tablet by mouth today and repeat in 48 hours Jarold Medici, MD  Active   fluticasone  (FLONASE ) 50 MCG/ACT nasal spray [id number] Yes Place 2 sprays into both nostrils daily. Vivienne Delon HERO, PA-C  Active Self, Spouse/Significant Other, Pharmacy Records  glimepiride  (AMARYL ) 2 MG tablet [id number]  Take 2 mg by mouth daily with breakfast.  Patient not taking: Reported on 01/01/2024   [provider]  Active Self, Spouse/Significant Other, Pharmacy Records  ibuprofen (ADVIL) 200 MG tablet [id number] Yes Take 400-600 mg by mouth every 6 (six) hours as needed for mild pain (pain score 1-3) or moderate pain (pain score 4-6). [provider]  Active Self, Spouse/Significant Other, Pharmacy Records  latanoprost (XALATAN) 0.005 % ophthalmic solution [id number] Yes Place 1 drop into both eyes daily. [provider]  Active Spouse/Significant Other, Self, Pharmacy Records  levothyroxine  (SYNTHROID ) 112 MCG tablet [id number] Yes Take 1 tablet (112 mcg total) by mouth daily. Jarold Medici, MD  Active Self, Spouse/Significant Other, Pharmacy Records  lisinopril  (ZESTRIL ) 5 MG tablet [id number] Yes Take 1 tablet by mouth once daily Jarold Medici, MD  Active Self, Spouse/Significant Other, Pharmacy Records  ondansetron  (ZOFRAN ) 4 MG tablet [id number] Yes Take 1 tablet (4 mg total) by mouth every 6 (six) hours as needed for nausea. Jonel Lonni SQUIBB, MD  Active   rosuvastatin  (CRESTOR ) 40 MG tablet [id number] Yes Take 1 tablet (40 mg total) by mouth daily. Shamleffer, Donell Cardinal, MD  Active Self,  Spouse/Significant Other, Pharmacy Records  tadalafil  (CIALIS ) 5 MG tablet [id number] Yes Take 5 mg by mouth daily. [provider]  Active Spouse/Significant Other, Self, Pharmacy Records           Med Note (CRUTHIS, CHLOE C   Fri Nov 29, 2023  1:23 PM)    Testosterone  1.62 % GEL [id number] Yes Apply 2 pumps daily [provider]  Active Self, Spouse/Significant Other, Pharmacy Records  timolol  (TIMOPTIC ) 0.5 % ophthalmic solution [id number] Yes Place 1 drop into both eyes at bedtime. [provider]  Active Spouse/Significant Other, Self, Pharmacy Records  UNABLE TO FIND [id number]  Inject 1 Dose into the skin See admin instructions. Med Name: Testosterone  Pellets  Insert pellets every 4 months - unknown dose [provider]  Active Self, Spouse/Significant Other, Pharmacy Records           Med Note (CRUTHIS, CHLOE C   Fri Nov 29, 2023  1:35 PM) Pt is unaware of the dose that was inserted. Pt stated he was last dosed in March of 2025.   Vitamin D , Ergocalciferol , (DRISDOL ) 1.25 MG (50000 UNIT) CAPS capsule [id number]  Take 1 capsule (50,000 Units total) by mouth See admin instructions. Take one capsule by mouth on Tues/Fridays  Patient not  taking: Reported on 11/13/2023   Jarold Medici, MD  Consider Medication Status and Discontinue (Completed Course) Self, Spouse/Significant Other, Pharmacy Records              Assessment/Plan:   {Pharmacy A/P Choices:26421}  Follow Up Plan: ***  ***

## 2024-01-09 ENCOUNTER — Telehealth: Payer: Self-pay

## 2024-01-09 NOTE — Telephone Encounter (Signed)
 PAP: Patient assistance application for Jardiance through [employer]) has been mailed to pt's home address on file. Provider portion of application will be faxed to provider's office.

## 2024-01-15 ENCOUNTER — Ambulatory Visit: Admitting: Internal Medicine

## 2024-01-15 NOTE — Telephone Encounter (Signed)
 Received Provider Portion of PAP application for Jardiance ([employer])

## 2024-01-21 NOTE — Progress Notes (Signed)
   01/21/2024  Patient ID: Connor Becker., male   DOB: 07-Nov-1950, 73 y.o.   MRN: [id number]  Called Patient to follow up. HIPAA identifiers were obtained.

## 2024-01-22 ENCOUNTER — Telehealth: Payer: Self-pay | Admitting: Pharmacist

## 2024-01-22 DIAGNOSIS — E1142 Type 2 diabetes mellitus with diabetic polyneuropathy: Secondary | ICD-10-CM

## 2024-01-22 NOTE — Progress Notes (Signed)
   01/22/2024  Patient ID: Connor JONELLE Rolfe Mickey., male   DOB: Jun 06, 1951, 73 y.o.   MRN: [id number]  Patient was called to follow up on Jardiance patient assistance. HIPAA identifiers were obtained.  Patient said he received the Jardiance paper work but that Dr. Jarold had given him Farxiga  samples most recently.  He said since started the Farxiga  and stopping Mounjaro , he noticed his blood sugars have been increasing.  Here is his report from [facility]:     GMI from two weeks ago was -6.8%   He is on Rosuvastatin  40mg  filled 11/15/23 and Lisinopril  5 mg filled 11/18/23-for 90 day supplies.   Will wait until Patient is in clinic to provide new forms for signature.   Connor Becker, PharmD, BCACP Clinical Pharmacist [phone]

## 2024-02-06 ENCOUNTER — Ambulatory Visit: Admitting: Internal Medicine

## 2024-02-07 ENCOUNTER — Encounter: Payer: Self-pay | Admitting: Internal Medicine

## 2024-02-10 DIAGNOSIS — H47233 Glaucomatous optic atrophy, bilateral: Secondary | ICD-10-CM | POA: Diagnosis not present

## 2024-02-10 DIAGNOSIS — E1165 Type 2 diabetes mellitus with hyperglycemia: Secondary | ICD-10-CM | POA: Diagnosis not present

## 2024-02-10 DIAGNOSIS — H0288B Meibomian gland dysfunction left eye, upper and lower eyelids: Secondary | ICD-10-CM | POA: Diagnosis not present

## 2024-02-10 DIAGNOSIS — H0288A Meibomian gland dysfunction right eye, upper and lower eyelids: Secondary | ICD-10-CM | POA: Diagnosis not present

## 2024-02-10 LAB — HM DIABETES EYE EXAM

## 2024-02-12 ENCOUNTER — Encounter: Payer: Self-pay | Admitting: Internal Medicine

## 2024-02-12 ENCOUNTER — Ambulatory Visit: Admitting: Internal Medicine

## 2024-02-12 VITALS — BP 122/70 | HR 70 | Ht 74.0 in | Wt 292.0 lb

## 2024-02-12 DIAGNOSIS — E1165 Type 2 diabetes mellitus with hyperglycemia: Secondary | ICD-10-CM | POA: Diagnosis not present

## 2024-02-12 DIAGNOSIS — E1142 Type 2 diabetes mellitus with diabetic polyneuropathy: Secondary | ICD-10-CM

## 2024-02-12 DIAGNOSIS — Z7984 Long term (current) use of oral hypoglycemic drugs: Secondary | ICD-10-CM

## 2024-02-12 LAB — POCT GLYCOSYLATED HEMOGLOBIN (HGB A1C): Hemoglobin A1C: 7.1 % — AB (ref 4.0–5.6)

## 2024-02-12 NOTE — Progress Notes (Signed)
 .   Name: Connor Becker.  Age/ Sex: 73 y.o., male   MRN/ DOB: [id number], 1951/03/13     PCP: Jarold Medici, MD   Reason for Endocrinology Evaluation: Type 2 Diabetes Mellitus  Initial Endocrine Consultative Visit: 04/13/2020    PATIENT IDENTIFIER: [name] is a 73 y.o. male with a past medical history of T2DM,OSA, hypogonadism and dyslipidemia. The patient has followed with Endocrinology clinic since 04/13/2020 for consultative assistance with management of his diabetes.     DIABETIC HISTORY:  Mr. Delair was diagnosed with DM2 in 2014,Metformin - GI side effect, Jardiance-severe urinary frequency preventing him from sleeping at night.  His hemoglobin A1c has ranged from 6.6%  in 2020, peaking at 7.7% in 2021.  On his initial visit to our clinic his A1c was 7.7%  ,  He was not on any glycemic medications. Started Rybelsus   Rybelsus  stopped by 06/2021 due to nausea and vomiting and started pioglitazone  but he did not start it  By 10/2021 he started using his wife's Ozempic  and tolerated well  He self discontinued glipizide  by August 2023-no reported intolerance just due to fear of hypoglycemia   By his visit in 07/2023 the patient had already discontinued Ozempic  due to severe GI side effects and we started him on glimepiride   By his visit with me in April, 2025 his PCP has started him on Mounjaro  and he discontinued glimepiride   Patient developed ileus while on Mounjaro , was hospitalized May/2025  Was started on Farxiga  by his PCP 01/2024    SUBJECTIVE:   During the last visit (11/13/2023): A1c 9.6 %     Today (02/12/2024): Mr. Kestler is here for a follow up on diabetes.  He checks his blood sugars multiple times a day through CGM. The patient has not had hypoglycemic episodes since the last clinic visit.   Since his visit here he was treated for ileus due to Mounjaro  in May, 2025 No nausea or vomiting  Has noted rare diarrhea  Denies urine  infections    HOME DIABETES REGIMEN:  Farxiga  10 mg daily  Rosuvastatin  40 mg daily    Statin: yes ACE-I/ARB: yes   CONTINUOUS GLUCOSE MONITORING RECORD INTERPRETATION    Dates of Recording: 7/17-7/30/2025  Sensor description:freestyle libre   Results statistics:   CGM use % of time 96  Average and SD 154/22.7  Time in range 78%  % Time Above 180 21  % Time above 250 1  % Time Below target 0      Glycemic patterns summary: BG's are high overnight and fluctuate during the day Hypoglycemic episodes occurred N.A Overnight periods: High     DIABETIC COMPLICATIONS: Microvascular complications:   Denies: CKD, retinopathy, neuropathy Last Eye Exam: Completed 02/10/2024  Macrovascular complications:   Denies: CAD, CVA, PVD   HISTORY:  Past Medical History:  Past Medical History:  Diagnosis Date   ALLERGIC RHINITIS    Chronic pain of both knees    DJD (degenerative joint disease) of knee    right   DM2 (diabetes mellitus, type 2) (HCC)    GERD    Glaucoma    History of nuclear stress test    Myoview  12/17: EF 53, no ischemia, Low Risk   HYPERLIPIDEMIA    myalgias from Lipitor   HYPOGONADISM, MALE    Hypothyroidism    INSOMNIA-SLEEP DISORDER-UNSPEC    Joint pain    Nausea, vomiting and diarrhea 11/29/2023   Obesity    OSTEOARTHRITIS  SLEEP APNEA, OBSTRUCTIVE    no CPAP; repeat sleep test pending 07/2016   Unspecified hypothyroidism 12/04/2013   Vitamin D  deficiency    Past Surgical History:  Past Surgical History:  Procedure Laterality Date   NASAL SEPTUM SURGERY     TONSILLECTOMY     UVULOPALATOPHARYNGOPLASTY     VASECTOMY     Social History:  reports that he has never smoked. He has never used smokeless tobacco. He reports that he does not currently use alcohol. He reports that he does not use drugs. Family History:  Family History  Problem Relation Age of Onset   Heart attack Mother 4       s/p MI   Other Mother        pacemaker    Diabetes Mother    High blood pressure Mother    High Cholesterol Mother    Thyroid  disease Mother    Obesity Mother    Macular degeneration Father    Thyroid  disease Father    Diabetes Maternal Uncle    Heart attack Maternal Uncle    Hypertension Other    Prostate cancer Other    Heart disease Other    Colon cancer Neg Hx      HOME MEDICATIONS: Allergies as of 02/12/2024       Reactions   Horseradish [armoracia Rusticana Ext (horseradish)] Anaphylaxis   Other Anaphylaxis   Soy sauce only    Lipitor [atorvastatin ] Other (See Comments)   Leg cramps   Metformin  And Related Other (See Comments)   GI upset   Mounjaro  [tirzepatide ] Other (See Comments)   Ileus        Medication List        Accurate as of February 12, 2024  9:45 AM. If you have any questions, ask your nurse or doctor.          STOP taking these medications    glimepiride  2 MG tablet Wait to take this until your doctor or other care provider tells you to start again. Commonly known as: AMARYL  Stopped by: Donell PARAS Lacrisha Bielicki   Jardiance 10 MG Tabs tablet Generic drug: empagliflozin Stopped by: Ousmane Seeman J Rakan Soffer       TAKE these medications    albuterol  108 (90 Base) MCG/ACT inhaler Commonly known as: VENTOLIN  HFA Inhale 2 puffs into the lungs every 6 (six) hours as needed for wheezing or shortness of breath.   azelastine  0.1 % nasal spray Commonly known as: ASTELIN  Place 2 sprays into both nostrils 2 (two) times daily. Use in each nostril as directed   Benadryl  Allergy 25 MG tablet Generic drug: diphenhydrAMINE  Take 25 mg by mouth every 4 (four) hours as needed for allergies.   EPINEPHrine  0.3 mg/0.3 mL Soaj injection Commonly known as: EPI-PEN Inject 0.3 mg into the muscle as needed for anaphylaxis.   ezetimibe  10 MG tablet Commonly known as: ZETIA  Take 1 tablet by mouth once daily   Farxiga  10 MG Tabs tablet Generic drug: dapagliflozin  propanediol Take 10 mg by mouth daily.    fluconazole  150 MG tablet Commonly known as: DIFLUCAN  Take one tablet by mouth today and repeat in 48 hours   fluticasone  50 MCG/ACT nasal spray Commonly known as: FLONASE  Place 2 sprays into both nostrils daily.   FreeStyle Libre 3 Plus Sensor Misc 1 Device by Does not apply route every 15 (fifteen) days. Place 1 sensor on the skin every 15 days. Use to check glucose continuously   ibuprofen 200 MG tablet Commonly known  as: ADVIL Take 400-600 mg by mouth every 6 (six) hours as needed for mild pain (pain score 1-3) or moderate pain (pain score 4-6).   latanoprost 0.005 % ophthalmic solution Commonly known as: XALATAN Place 1 drop into both eyes daily.   levothyroxine  112 MCG tablet Commonly known as: SYNTHROID  Take 1 tablet (112 mcg total) by mouth daily.   lisinopril  5 MG tablet Commonly known as: ZESTRIL  Take 1 tablet by mouth once daily   ondansetron  4 MG tablet Commonly known as: ZOFRAN  Take 1 tablet (4 mg total) by mouth every 6 (six) hours as needed for nausea.   rosuvastatin  40 MG tablet Commonly known as: CRESTOR  Take 1 tablet (40 mg total) by mouth daily.   tadalafil  5 MG tablet Commonly known as: CIALIS  Take 5 mg by mouth daily.   Testosterone  1.62 % Gel Apply 2 pumps daily   timolol  0.5 % ophthalmic solution Commonly known as: TIMOPTIC  Place 1 drop into both eyes at bedtime.   UNABLE TO FIND Inject 1 Dose into the skin See admin instructions. Med Name: Testosterone  Pellets  Insert pellets every 4 months - unknown dose   Vitamin D  (Ergocalciferol ) 1.25 MG (50000 UNIT) Caps capsule Commonly known as: DRISDOL  Take 1 capsule (50,000 Units total) by mouth See admin instructions. Take one capsule by mouth on Tues/Fridays         OBJECTIVE:   Vital Signs: BP 122/70 (BP Location: Left Arm, Patient Position: Sitting, Cuff Size: Normal)   Pulse 70   Ht 6' 2 (1.88 m)   Wt 292 lb (132.5 kg)   SpO2 99%   BMI 37.49 kg/m   Wt Readings from Last 3  Encounters:  02/12/24 292 lb (132.5 kg)  12/11/23 296 lb 9.6 oz (134.5 kg)  11/29/23 300 lb (136.1 kg)     Exam: General: Pt appears well and is in NAD  Lungs: Clear with good BS bilat   Heart: RRR   Extremities: Trace pretibial edema.   Neuro: MS is good with appropriate affect, pt is alert and Ox3   DM foot exam: 08/13/2023   The skin of the feet is intact without sores or ulcerations. The pedal pulses are 1+ on right and 1+ on left. The sensation is decreased to a screening 5.07, 10 gram monofilament bilaterally     DATA REVIEWED:  Lab Results  Component Value Date   HGBA1C 7.1 (A) 02/12/2024   HGBA1C 9.8 (H) 10/03/2023   HGBA1C 9.6 (A) 08/13/2023    Latest Reference Range & Units 12/11/23 15:44  Sodium 134 - 144 mmol/L 137  Potassium 3.5 - 5.2 mmol/L 4.6  Chloride 96 - 106 mmol/L 102  CO2 20 - 29 mmol/L 21  Glucose 70 - 99 mg/dL 89  BUN 8 - 27 mg/dL 12  Creatinine 9.23 - 8.72 mg/dL 8.80  Calcium  8.6 - 10.2 mg/dL 8.9  BUN/Creatinine Ratio 10 - 24  10  eGFR >59 mL/min/1.73 64     Old records , labs and images have been reviewed.    ASSESSMENT / PLAN / RECOMMENDATIONS:   1) Type 2 Diabetes Mellitus, optimally controlled, With neuropathic complications - Most recent A1c of 7.1% Goal A1c < 7.0 %.    -A1c is trending down -Patient intolerant to Rybelsus  and Ozempic  -Intolerant to Mounjaro  due to ileus -Intolerant to Jardiance due to genital infections -He is intolerant to metformin  - He was recently started on Farxiga  through his PCPs office, he is currently using samples, he has a  follow-up with his PCP  MEDICATIONS: - Continue Farxiga  10 mg daily     EDUCATION / INSTRUCTIONS: BG monitoring instructions: Patient is instructed to check his blood sugars  3 times a day [facility] if: BG persistently < 70  I reviewed the Rule of 15 for the treatment of hypoglycemia in detail with the patient. Literature supplied.    2) Diabetic  complications:  Eye: Does not have known diabetic retinopathy.  Neuro/ Feet: Does not have known diabetic peripheral neuropathy .  Renal: Patient does not have known baseline CKD.      Recommend continuation of current Tx with primary MD and consultative f/u at [facility] clinic in the future if pt's DM control becomes problematic.    Signed electronically by: Stefano Redgie Butts, MD  [facility] Endocrinology  [facility] [address] [place] [place], [place] [address] Phone: [phone] FAX: [phone]   CC: Jarold Medici, MD [address] [place] [address] Phone: [phone]  Fax: [phone]  Return to Endocrinology clinic as below: Future Appointments  Date Time Provider Department Center  03/25/2024  3:00 PM Jarold Medici, MD [facility] None  11/11/2024 11:40 AM [facility]-ANNUAL WELLNESS VISIT [facility] None

## 2024-02-12 NOTE — Patient Instructions (Addendum)
 Please continue to take Farxiga  10 mg daily.  Dr. Jarold advised, please continue to follow-up with Dr. Jarold, we will be happy to see you again if there are any issues with managing your diabetes   HOW TO TREAT LOW BLOOD SUGARS (Blood sugar LESS THAN 70 MG/DL) Please follow the RULE OF 15 for the treatment of hypoglycemia treatment (when your (blood sugars are less than 70 mg/dL)   STEP 1: Take 15 grams of carbohydrates when your blood sugar is low, which includes:  3-4 GLUCOSE TABS  OR 3-4 OZ OF JUICE OR REGULAR SODA OR ONE TUBE OF GLUCOSE GEL    STEP 2: RECHECK blood sugar in 15 MINUTES STEP 3: If your blood sugar is still low at the 15 minute recheck --> then, go back to STEP 1 and treat AGAIN with another 15 grams of carbohydrates.

## 2024-02-18 ENCOUNTER — Telehealth: Payer: Self-pay

## 2024-02-18 NOTE — Telephone Encounter (Signed)
 Pap application no longer needed- change of drug therapy.

## 2024-02-18 NOTE — Telephone Encounter (Signed)
 PAP: Patient assistance application for Farxiga through [employer] (AZ&Me) has been mailed to pt's home address on file. Provider portion of application will be faxed to provider's office.

## 2024-02-19 ENCOUNTER — Telehealth: Payer: Self-pay | Admitting: Pharmacist

## 2024-02-19 DIAGNOSIS — E1165 Type 2 diabetes mellitus with hyperglycemia: Secondary | ICD-10-CM

## 2024-02-19 NOTE — Telephone Encounter (Signed)
-----   Message from Cassius Brought sent at 02/06/2024  4:14 PM EDT ----- Regarding: Follow up after endo appt F ----- Message ----- From: Brought Cassius PARAS, [facility] Sent: 02/05/2024   8:00 AM EDT To: Cassius PARAS Brought, RPH Subject: FW: follow up on med assistance and blood su#  Get Paper work ready for farxiga  if he stays on it ----- Message ----- From: Brought Cassius PARAS, RPH Sent: 01/21/2024   8:00 AM EDT To: Cassius PARAS Brought, RPH Subject: follow up on med assistance and blood sugars

## 2024-02-19 NOTE — Progress Notes (Signed)
   02/19/2024  Patient ID: Connor Becker., male   DOB: 1951/01/11, 73 y.o.   MRN: [id number]  Patient was called to follow up on blood sugars. Unfortunately, he did not answer the phone. HIPAA compliant message was left on his voicemail.  Patient's chart was reviewed.  He saw Endocrinology (Dr. Sam) on 02/12/24)  He is back on Farxiga  as confirmed by his appointment. As such the AZ&ME Provider portion of the application was printed and given to Dr. Jarold for signature. Once signature is received, the application will be sent back to Luke Mall, CPhT.  Most recent A1c-7.1%--down from 9.8%  Lab Results  Component Value Date   HGBA1C 7.1 (A) 02/12/2024   HGBA1C 9.8 (H) 10/03/2023   HGBA1C 9.6 (A) 08/13/2023   Readings from [facility] Libreview:   (Medication Adherence Measures and SUPD) Rosuvastatin  40 mg filled 11/15/23 #90 Lisinopril  5 mg filled 11/18/23 #90   Connor Becker, PharmD, BCACP Clinical Pharmacist [phone]

## 2024-02-27 NOTE — Telephone Encounter (Signed)
 Reached out to patient regarding Farxiga  (AZ&ME) application if received or any assistance needed. Left HIPAA compliant v/m for return call.

## 2024-03-03 ENCOUNTER — Encounter: Payer: Self-pay | Admitting: Internal Medicine

## 2024-03-04 ENCOUNTER — Other Ambulatory Visit: Payer: Self-pay

## 2024-03-04 MED ORDER — FLUCONAZOLE 150 MG PO TABS: ORAL_TABLET | ORAL | 0 refills | Status: DC

## 2024-03-09 NOTE — Telephone Encounter (Signed)
 Reached out again to patient/ spouse this time regarding PAP application for Farxiga  and the application was received and Connor Becker is working on completing the application and returning this week.

## 2024-03-11 ENCOUNTER — Other Ambulatory Visit: Payer: Self-pay | Admitting: Internal Medicine

## 2024-03-23 ENCOUNTER — Telehealth: Payer: Self-pay | Admitting: Pharmacist

## 2024-03-23 DIAGNOSIS — E1142 Type 2 diabetes mellitus with diabetic polyneuropathy: Secondary | ICD-10-CM

## 2024-03-23 NOTE — Progress Notes (Signed)
   03/23/2024  Patient ID: Connor Becker., male   DOB: [birth year], 73 y.o.   MRN: [id number]  Pharmacy Quality Measure Review  This patient is appearing on a report for being at risk of failing the adherence measure for diabetes and hypertension (ACEi/ARB) medications this calendar year.   Medication: Lisinopril  5 mg Last fill date: 03/11/2024 for 90 day supply  Mounjaro  5 mg--stopped due to small bowel obstruction  Patient was called. HIPAA identifiers were obtained.  Patient reported feeling well.  His blood sugars were reviewed from freetyle libreview app:     Patient was asked about his Farxiga  application. He thought he needed a prescription before completing the paperwork.  He will bring the paperwork to his upcoming appointment.   GMI- 6.8%  Will follow up with Patient at his upcoming appointment on 02/23/24.    Cassius DOROTHA Brought, PharmD, [facility] Clinical Pharmacist [facility] [phone]

## 2024-03-25 ENCOUNTER — Encounter: Payer: Self-pay | Admitting: Internal Medicine

## 2024-03-25 ENCOUNTER — Ambulatory Visit: Admitting: Internal Medicine

## 2024-03-25 VITALS — BP 118/82 | HR 77 | Temp 98.1°F | Ht 74.0 in | Wt 296.4 lb

## 2024-03-25 DIAGNOSIS — E785 Hyperlipidemia, unspecified: Secondary | ICD-10-CM | POA: Diagnosis not present

## 2024-03-25 DIAGNOSIS — Z0000 Encounter for general adult medical examination without abnormal findings: Secondary | ICD-10-CM | POA: Diagnosis not present

## 2024-03-25 DIAGNOSIS — M7121 Synovial cyst of popliteal space [Baker], right knee: Secondary | ICD-10-CM | POA: Diagnosis not present

## 2024-03-25 DIAGNOSIS — Z6838 Body mass index (BMI) 38.0-38.9, adult: Secondary | ICD-10-CM

## 2024-03-25 DIAGNOSIS — E039 Hypothyroidism, unspecified: Secondary | ICD-10-CM | POA: Diagnosis not present

## 2024-03-25 DIAGNOSIS — Z889 Allergy status to unspecified drugs, medicaments and biological substances status: Secondary | ICD-10-CM

## 2024-03-25 DIAGNOSIS — K219 Gastro-esophageal reflux disease without esophagitis: Secondary | ICD-10-CM

## 2024-03-25 DIAGNOSIS — E1169 Type 2 diabetes mellitus with other specified complication: Secondary | ICD-10-CM | POA: Diagnosis not present

## 2024-03-25 DIAGNOSIS — T7840XA Allergy, unspecified, initial encounter: Secondary | ICD-10-CM

## 2024-03-25 DIAGNOSIS — E66812 Obesity, class 2: Secondary | ICD-10-CM

## 2024-03-25 DIAGNOSIS — E1165 Type 2 diabetes mellitus with hyperglycemia: Secondary | ICD-10-CM | POA: Diagnosis not present

## 2024-03-25 DIAGNOSIS — E291 Testicular hypofunction: Secondary | ICD-10-CM

## 2024-03-25 LAB — POCT URINALYSIS DIP (CLINITEK)
Bilirubin, UA: NEGATIVE
Glucose, UA: 500 mg/dL — AB
Ketones, POC UA: NEGATIVE mg/dL
Nitrite, UA: NEGATIVE
POC PROTEIN,UA: NEGATIVE
Spec Grav, UA: 1.01 (ref 1.010–1.025)
Urobilinogen, UA: 0.2 U/dL
pH, UA: 6.5 (ref 5.0–8.0)

## 2024-03-25 MED ORDER — EPINEPHRINE 0.3 MG/0.3ML IJ SOAJ: 0.3000 mg | INTRAMUSCULAR | 0 refills | Status: AC | PRN

## 2024-03-25 MED ORDER — EZETIMIBE 10 MG PO TABS: 10.0000 mg | ORAL_TABLET | Freq: Every day | ORAL | 2 refills | Status: AC

## 2024-03-25 MED ORDER — LISINOPRIL 5 MG PO TABS: 5.0000 mg | ORAL_TABLET | Freq: Every day | ORAL | 2 refills | Status: AC

## 2024-03-25 NOTE — Patient Instructions (Signed)
 Health Maintenance, Male  Adopting a healthy lifestyle and getting preventive care are important in promoting health and wellness. Ask your health care provider about:  The right schedule for you to have regular tests and exams.  Things you can do on your own to prevent diseases and keep yourself healthy.  What should I know about diet, weight, and exercise?  Eat a healthy diet    Eat a diet that includes plenty of vegetables, fruits, low-fat dairy products, and lean protein.  Do not eat a lot of foods that are high in solid fats, added sugars, or sodium.  Maintain a healthy weight  Body mass index (BMI) is a measurement that can be used to identify possible weight problems. It estimates body fat based on height and weight. Your health care provider can help determine your BMI and help you achieve or maintain a healthy weight.  Get regular exercise  Get regular exercise. This is one of the most important things you can do for your health. Most adults should:  Exercise for at least 150 minutes each week. The exercise should increase your heart rate and make you sweat (moderate-intensity exercise).  Do strengthening exercises at least twice a week. This is in addition to the moderate-intensity exercise.  Spend less time sitting. Even light physical activity can be beneficial.  Watch cholesterol and blood lipids  Have your blood tested for lipids and cholesterol at 75 years of age, then have this test every 5 years.  You may need to have your cholesterol levels checked more often if:  Your lipid or cholesterol levels are high.  You are older than 75 years of age.  You are at high risk for heart disease.  What should I know about cancer screening?  Many types of cancers can be detected early and may often be prevented. Depending on your health history and family history, you may need to have cancer screening at various ages. This may include screening for:  Colorectal cancer.  Prostate cancer.  Skin cancer.  Lung  cancer.  What should I know about heart disease, diabetes, and high blood pressure?  Blood pressure and heart disease  High blood pressure causes heart disease and increases the risk of stroke. This is more likely to develop in people who have high blood pressure readings or are overweight.  Talk with your health care provider about your target blood pressure readings.  Have your blood pressure checked:  Every 3-5 years if you are 24-52 years of age.  Every year if you are 3 years old or older.  If you are between the ages of 60 and 72 and are a current or former smoker, ask your health care provider if you should have a one-time screening for abdominal aortic aneurysm (AAA).  Diabetes  Have regular diabetes screenings. This checks your fasting blood sugar level. Have the screening done:  Once every three years after age 66 if you are at a normal weight and have a low risk for diabetes.  More often and at a younger age if you are overweight or have a high risk for diabetes.  What should I know about preventing infection?  Hepatitis B  If you have a higher risk for hepatitis B, you should be screened for this virus. Talk with your health care provider to find out if you are at risk for hepatitis B infection.  Hepatitis C  Blood testing is recommended for:  Everyone born from [birth year] through 1967.  Anyone  with known risk factors for hepatitis C.  Sexually transmitted infections (STIs)  You should be screened each year for STIs, including gonorrhea and chlamydia, if:  You are sexually active and are younger than 75 years of age.  You are older than 75 years of age and your health care provider tells you that you are at risk for this type of infection.  Your sexual activity has changed since you were last screened, and you are at increased risk for chlamydia or gonorrhea. Ask your health care provider if you are at risk.  Ask your health care provider about whether you are at high risk for HIV. Your health care provider  may recommend a prescription medicine to help prevent HIV infection. If you choose to take medicine to prevent HIV, you should first get tested for HIV. You should then be tested every 3 months for as long as you are taking the medicine.  Follow these instructions at home:  Alcohol use  Do not drink alcohol if your health care provider tells you not to drink.  If you drink alcohol:  Limit how much you have to 0-2 drinks a day.  Know how much alcohol is in your drink. In the [place] one drink equals one 12 oz bottle of beer (355 mL), one 5 oz glass of wine (148 mL), or one 1 oz glass of hard liquor (44 mL).  Lifestyle  Do not use any products that contain nicotine or tobacco. These products include cigarettes, chewing tobacco, and vaping devices, such as e-cigarettes. If you need help quitting, ask your health care provider.  Do not use street drugs.  Do not share needles.  Ask your health care provider for help if you need support or information about quitting drugs.  General instructions  Schedule regular health, dental, and eye exams.  Stay current with your vaccines.  Tell your health care provider if:  You often feel depressed.  You have ever been abused or do not feel safe at home.  Summary  Adopting a healthy lifestyle and getting preventive care are important in promoting health and wellness.  Follow your health care provider's instructions about healthy diet, exercising, and getting tested or screened for diseases.  Follow your health care provider's instructions on monitoring your cholesterol and blood pressure.  This information is not intended to replace advice given to you by your health care provider. Make sure you discuss any questions you have with your health care provider.  Document Revised: 11/21/2020 Document Reviewed: 11/21/2020  Elsevier Patient Education  2024 [employer].

## 2024-03-25 NOTE — Progress Notes (Signed)
 I,Connor Becker, CMA,acting as a Neurosurgeon for Connor LOISE Slocumb, MD.,have documented all relevant documentation on the behalf of Connor LOISE Slocumb, MD,as directed by  Connor LOISE Slocumb, MD while in the presence of Connor LOISE Slocumb, MD.  Subjective:   Patient ID: Connor Becker , male    DOB: Jan 28, 1951 , 73 y.o.   MRN: [id number]  Chief Complaint  Patient presents with   Annual Exam    Patient presents today for annual exam. He reports compliance with medications. Denies headache, chest pain & sob. Completes prostate exams with urology.    Diabetes   Hypothyroidism    HPI Discussed the use of AI scribe software for clinical note transcription with the patient, who gave verbal consent to proceed.  History of Present Illness Connor Becker is a 73 year old male who presents for an annual physical exam and diabetes check.  He experiences recurrent flare-ups of a Baker's cyst on his right leg, which becomes inflamed with physical activity such as carrying lumber and improves with rest. Cortisone shots previously provided relief but are no longer effective.  He manages his diabetes with a Libre 8.3 for glucose monitoring, showing a GMI of 6.8. His blood sugar levels average around 150 mg/dL, with higher readings in the morning. He has been using samples of Farxiga  for diabetes management, which are running out.  He takes ezetimibe  and rosuvastatin  for cholesterol management. For thyroid  management, he takes 112 mcg of generic Synthroid  daily. He also receives testosterone  supplementation from a urologist.  He underwent cataract surgery a year ago and is dissatisfied with the corrective lenses, as they have affected his close vision. He has difficulty seeing in bright conditions, reporting that everything appears dark, which affects his ability to see dials in his car and people's faces indoors. He has a family history of macular degeneration, as his father had the condition. He  also has a history of glaucoma with existing damage in both eyes.  He uses albuterol  as needed, though only twice since obtaining it. He no longer uses Astelin  nasal spray despite having allergies, preferring saline solution and Sudafed for symptom management. He has an EpiPen  for horseradish allergy, which he shares with his wife.   Diabetes He presents for his follow-up diabetic visit. He has type 2 diabetes mellitus. His disease course has been stable. There are no hypoglycemic associated symptoms. Pertinent negatives for diabetes include no polydipsia, no polyphagia and no polyuria. There are no hypoglycemic complications. Risk factors for coronary artery disease include diabetes mellitus, dyslipidemia, male sex, obesity and sedentary lifestyle. He participates in exercise intermittently. An ACE inhibitor/angiotensin II receptor blocker is being taken. Eye exam is not current.  Hyperlipidemia This is a chronic problem. The current episode started more than 1 year ago. The problem is controlled. Exacerbating diseases include diabetes, hypothyroidism and obesity. Pertinent negatives include no shortness of breath. Current antihyperlipidemic treatment includes statins. The current treatment provides moderate improvement of lipids.     Past Medical History:  Diagnosis Date   ALLERGIC RHINITIS    Chronic pain of both knees    DJD (degenerative joint disease) of knee    right   DM2 (diabetes mellitus, type 2) (HCC)    GERD    Glaucoma    History of nuclear stress test    Myoview  12/17: EF 53, no ischemia, Low Risk   HYPERLIPIDEMIA    myalgias from Lipitor   HYPOGONADISM, MALE    Hypothyroidism  INSOMNIA-SLEEP DISORDER-UNSPEC    Joint pain    Nausea, vomiting and diarrhea 11/29/2023   Obesity    OSTEOARTHRITIS    SLEEP APNEA, OBSTRUCTIVE    no CPAP; repeat sleep test pending 07/2016   Unspecified hypothyroidism 12/04/2013   Vitamin D  deficiency      Family History  Problem Relation  Age of Onset   Heart attack Mother 3       s/p MI   Other Mother        pacemaker   Diabetes Mother    High blood pressure Mother    High Cholesterol Mother    Thyroid  disease Mother    Obesity Mother    Macular degeneration Father    Thyroid  disease Father    Diabetes Maternal Uncle    Heart attack Maternal Uncle    Hypertension Other    Prostate cancer Other    Heart disease Other    Colon cancer Neg Hx      Current Outpatient Medications:    albuterol  (VENTOLIN  HFA) 108 (90 Base) MCG/ACT inhaler, Inhale 2 puffs into the lungs every 6 (six) hours as needed for wheezing or shortness of breath., Disp: 8 g, Rfl: 2   Continuous Glucose Sensor (FREESTYLE LIBRE 3 PLUS SENSOR) MISC, 1 Device by Does not apply route every 15 (fifteen) days. Place 1 sensor on the skin every 15 days. Use to check glucose continuously, Disp: 1 each, Rfl: 2   diphenhydrAMINE  (BENADRYL  ALLERGY) 25 MG tablet, Take 25 mg by mouth every 4 (four) hours as needed for allergies., Disp: , Rfl:    fluticasone  (FLONASE ) 50 MCG/ACT nasal spray, Place 2 sprays into both nostrils daily., Disp: 16 g, Rfl: 0   ibuprofen (ADVIL) 200 MG tablet, Take 400-600 mg by mouth every 6 (six) hours as needed for mild pain (pain score 1-3) or moderate pain (pain score 4-6)., Disp: , Rfl:    latanoprost (XALATAN) 0.005 % ophthalmic solution, Place 1 drop into both eyes daily., Disp: , Rfl:    levothyroxine  (SYNTHROID ) 112 MCG tablet, Take 1 tablet (112 mcg total) by mouth daily., Disp: 90 tablet, Rfl: 2   rosuvastatin  (CRESTOR ) 40 MG tablet, Take 1 tablet (40 mg total) by mouth daily., Disp: 90 tablet, Rfl: 3   tadalafil  (CIALIS ) 5 MG tablet, Take 5 mg by mouth daily., Disp: , Rfl: 11   Testosterone  1.62 % GEL, Apply 2 pumps daily, Disp: , Rfl:    timolol  (TIMOPTIC ) 0.5 % ophthalmic solution, Place 1 drop into both eyes at bedtime., Disp: , Rfl: 2   UNABLE TO FIND, Inject 1 Dose into the skin See admin instructions. Med Name:  Testosterone  Pellets  Insert pellets every 4 months - unknown dose, Disp: , Rfl:    EPINEPHrine  0.3 mg/0.3 mL IJ SOAJ injection, Inject 0.3 mg into the muscle as needed for anaphylaxis., Disp: 1 each, Rfl: 0   ezetimibe  (ZETIA ) 10 MG tablet, Take 1 tablet (10 mg total) by mouth daily., Disp: 90 tablet, Rfl: 2   fluconazole  (DIFLUCAN ) 150 MG tablet, Take one tablet by mouth today and repeat in 48 hours (Patient not taking: Reported on 03/25/2024), Disp: 2 tablet, Rfl: 0   lisinopril  (ZESTRIL ) 5 MG tablet, Take 1 tablet (5 mg total) by mouth daily., Disp: 90 tablet, Rfl: 2   Allergies  Allergen Reactions   Horseradish [Armoracia Rusticana Ext (Horseradish)] Anaphylaxis   Other Anaphylaxis    Soy sauce only    Lipitor [Atorvastatin ] Other (See Comments)  Leg cramps   Metformin  And Related Other (See Comments)    GI upset   Mounjaro  [Tirzepatide ] Other (See Comments)    Ileus     Men's preventive visit. Patient Health Questionnaire (PHQ-2) is  Flowsheet Row Office Visit from 03/25/2024 in [facility]  PHQ-2 Total Score 0  . Patient is on a diabetic diet. Marital status: Married. Relevant history for alcohol use is:  Social History   Substance and Sexual Activity  Alcohol Use Not Currently   Comment: occassional beer  . Relevant history for tobacco use is:  Social History   Tobacco Use  Smoking Status Never  Smokeless Tobacco Never  .   Review of Systems  Constitutional: Negative.   HENT: Negative.    Eyes: Negative.   Respiratory: Negative.  Negative for shortness of breath.   Cardiovascular: Negative.   Gastrointestinal: Negative.   Endocrine: Negative.  Negative for polydipsia, polyphagia and polyuria.  Genitourinary: Negative.   Musculoskeletal:  Positive for arthralgias.  Skin: Negative.   Allergic/Immunologic: Negative.   Neurological: Negative.   Hematological: Negative.      Today's Vitals   03/25/24 1508  BP: 118/82  Pulse:  77  Temp: 98.1 F (36.7 C)  SpO2: 98%  Weight: 296 lb 6.4 oz (134.4 kg)  Height: 6' 2 (1.88 m)   Body mass index is 38.06 kg/m.  Wt Readings from Last 3 Encounters:  03/25/24 296 lb 6.4 oz (134.4 kg)  02/12/24 292 lb (132.5 kg)  12/11/23 296 lb 9.6 oz (134.5 kg)    Objective:  Physical Exam Vitals and nursing note reviewed.  Constitutional:      Appearance: Normal appearance. He is obese.  HENT:     Head: Normocephalic and atraumatic.     Right Ear: Tympanic membrane, ear canal and external ear normal.     Left Ear: Tympanic membrane, ear canal and external ear normal.     Mouth/Throat:     Pharynx: No oropharyngeal exudate or posterior oropharyngeal erythema.  Eyes:     Extraocular Movements: Extraocular movements intact.     Conjunctiva/sclera: Conjunctivae normal.     Pupils: Pupils are equal, round, and reactive to light.  Cardiovascular:     Rate and Rhythm: Normal rate and regular rhythm.     Pulses: Normal pulses.          Dorsalis pedis pulses are 2+ on the right side and 2+ on the left side.     Heart sounds: Normal heart sounds.  Pulmonary:     Effort: Pulmonary effort is normal.     Breath sounds: Normal breath sounds.  Chest:  Breasts:    Right: Normal. No swelling, bleeding, inverted nipple, mass or nipple discharge.     Left: Normal. No swelling, bleeding, inverted nipple, mass or nipple discharge.  Abdominal:     General: Bowel sounds are normal.     Palpations: Abdomen is soft.     Comments: Obese, soft  Genitourinary:    Comments: deferred Musculoskeletal:        General: Normal range of motion.     Cervical back: Normal range of motion and neck supple.  Feet:     Right foot:     Protective Sensation: 5 sites tested.  5 sites sensed.     Skin integrity: Dry skin present.     Toenail Condition: Right toenails are long.     Left foot:     Protective Sensation: 5 sites tested.  5 sites sensed.     Skin integrity: Dry skin present.     Toenail  Condition: Left toenails are long.  Skin:    General: Skin is warm.  Neurological:     General: No focal deficit present.     Mental Status: He is alert.  Psychiatric:        Mood and Affect: Mood normal.        Behavior: Behavior normal.         Assessment And Plan:    Encounter for general adult medical examination w/o abnormal findings Assessment & Plan: A full exam was performed.  DRE deferred, he is followed by Urology.  He is advised to get 30-45 minutes of regular exercise, no less than four to five days per week. Both weight-bearing and aerobic exercises are recommended.  He is advised to follow a healthy diet with at least six fruits/veggies per day, decrease intake of red meat and other saturated fats and to increase fish intake to twice weekly.  Meats/fish should not be fried -- baked, boiled or broiled is preferable. It is also important to cut back on your sugar intake.  Be sure to read labels - try to avoid anything with added sugar, high fructose corn syrup or other sweeteners.  If you must use a sweetener, you can try stevia or monkfruit.  It is also important to avoid artificially sweetened foods/beverages and diet drinks. Lastly, wear SPF 50 sunscreen on exposed skin and when in direct sunlight for an extended period of time.  Be sure to avoid fast food restaurants and aim for at least 60 ounces of water daily.    - Ensure receipt of eye examination report. - Confirm prostate check with urologist.   Type 2 diabetes mellitus with hyperglycemia, without long-term current use of insulin  (HCC) Assessment & Plan: Type 2 diabetes mellitus with GMI of 6.8 and average blood sugar levels around 150 mg/dL, higher in the morning. Using Libre 8.3 for glucose monitoring. Discussed diet's impact on blood sugar spikes and improved glucose control's effect on yeast infections. - Schedule a four-month diabetes check. - Schedule a lab visit in four weeks.  Orders: -     POCT URINALYSIS  DIP (CLINITEK) -     Microalbumin / creatinine urine ratio -     EKG 12-Lead  Hyperlipidemia due to type 2 diabetes mellitus (HCC) Assessment & Plan: Chronic, LDL goal is less than 70.  He will continue with rosuvastatin  40mg  daily.  - Diabetic foot exam was performed.  He is intolerant to GLP-1 therapy.  He will continue with Farxiga . - Continue under care of Endo.   Orders: -     CMP14+EGFR -     Lipid panel -     Hemoglobin A1c; Future -     Ezetimibe ; Take 1 tablet (10 mg total) by mouth daily.  Dispense: 90 tablet; Refill: 2 -     CBC  Primary hypothyroidism Assessment & Plan: Hypothyroidism with recent thyroid  function tests suggesting potential need for medication adjustment. Currently on 112 mcg of Synthroid  daily. Last check in March indicated levels slightly off, suggesting possible dosage increase. - Recheck thyroid  function tests today. - Adjust Synthroid  dosage based on test results.  Orders: -     TSH + free T4  Synovial cyst of right popliteal space Assessment & Plan: Chronic Baker's cyst in the right knee with recurrent flare-ups. Previous cortisone injections ineffective. Symptoms exacerbated by physical activity. Discussed potential knee replacement  due to frequent occurrences over the past three years. - Consider use of Voltaren gel for symptom relief. - Can go to Ortho if he desires drainage   Hypogonadism in male -     Testosterone ,Free and Total  Class 2 severe obesity due to excess calories with serious comorbidity and body mass index (BMI) of 38.0 to 38.9 in adult [facility]) Assessment & Plan: BMI 38. He is encouraged to initially strive for BMI less than 30 to decrease cardiac risk. He is advised to exercise no less than 150 minutes per week.  He was congratulated on his efforts on making necessary dietary changes.     History of allergic reaction -     EPINEPHrine ; Inject 0.3 mg into the muscle as needed for anaphylaxis.  Dispense: 1 each; Refill:  0  Other orders -     Lisinopril ; Take 1 tablet (5 mg total) by mouth daily.  Dispense: 90 tablet; Refill: 2     Return in 4 weeks (on 04/22/2024), or lab visit--check a1c, for 1 year HM, 4 month dm f/u.SABRA Patient was given opportunity to ask questions. Patient verbalized understanding of the plan and was able to repeat key elements of the plan. All questions were answered to their satisfaction.   I, Connor LOISE Slocumb, MD, have reviewed all documentation for this visit. The documentation on 03/25/24 for the exam, diagnosis, procedures, and orders are all accurate and complete.

## 2024-03-26 ENCOUNTER — Ambulatory Visit: Payer: Self-pay | Admitting: Internal Medicine

## 2024-03-26 NOTE — Telephone Encounter (Signed)
 PAP: Application for Connor Becker has been submitted to [employer] ([employer]), via fax

## 2024-03-30 ENCOUNTER — Encounter: Payer: Self-pay | Admitting: Pharmacist

## 2024-03-30 NOTE — Progress Notes (Signed)
   03/30/2024  Patient ID: Connor Becker., male   DOB: 1950/10/28, 73 y.o.   MRN: [id number]  Pharmacy Quality Measure Review  This patient is appearing on a report for being at risk of failing the adherence measure for cholesterol (statin) medications this calendar year.   Medication: Rosuvastatin  40 mg Last fill date: 03/11/2024 for 90 day supply  Insurance report was not up to date. No action needed at this time.    Cassius DOROTHA Brought, PharmD, BCACP Clinical Pharmacist [phone]

## 2024-03-31 LAB — LIPID PANEL
Chol/HDL Ratio: 4.9 ratio (ref 0.0–5.0)
Cholesterol, Total: 197 mg/dL (ref 100–199)
HDL: 40 mg/dL (ref >39)
LDL Chol Calc (NIH): 129 mg/dL — ABNORMAL HIGH (ref 0–99)
Triglycerides: 158 mg/dL — ABNORMAL HIGH (ref 0–149)
VLDL Cholesterol Cal: 28 mg/dL (ref 5–40)

## 2024-03-31 LAB — CMP14+EGFR
ALT: 25 IU/L (ref 0–44)
AST: 24 IU/L (ref 0–40)
Albumin: 4.4 g/dL (ref 3.8–4.8)
Alkaline Phosphatase: 76 IU/L (ref 44–121)
BUN/Creatinine Ratio: 10 (ref 10–24)
BUN: 11 mg/dL (ref 8–27)
Bilirubin Total: 0.7 mg/dL (ref 0.0–1.2)
CO2: 22 mmol/L (ref 20–29)
Calcium: 9.1 mg/dL (ref 8.6–10.2)
Chloride: 100 mmol/L (ref 96–106)
Creatinine, Ser: 1.07 mg/dL (ref 0.76–1.27)
Globulin, Total: 3 g/dL (ref 1.5–4.5)
Glucose: 87 mg/dL (ref 70–99)
Potassium: 4.5 mmol/L (ref 3.5–5.2)
Sodium: 135 mmol/L (ref 134–144)
Total Protein: 7.4 g/dL (ref 6.0–8.5)
eGFR: 72 mL/min/1.73 (ref >59)

## 2024-03-31 LAB — TESTOSTERONE,FREE AND TOTAL
Testosterone, Free: 16.3 pg/mL (ref 6.6–18.1)
Testosterone: 1096 ng/dL — ABNORMAL HIGH (ref 264–916)

## 2024-03-31 LAB — CBC
Hematocrit: 51 % (ref 37.5–51.0)
Hemoglobin: 16.8 g/dL (ref 13.0–17.7)
MCH: 29.7 pg (ref 26.6–33.0)
MCHC: 32.9 g/dL (ref 31.5–35.7)
MCV: 90 fL (ref 79–97)
Platelets: 242 x10E3/uL (ref 150–450)
RBC: 5.65 x10E6/uL (ref 4.14–5.80)
RDW: 14.7 % (ref 11.6–15.4)
WBC: 8.4 x10E3/uL (ref 3.4–10.8)

## 2024-03-31 LAB — MICROALBUMIN / CREATININE URINE RATIO
Creatinine, Urine: 32.1 mg/dL
Microalb/Creat Ratio: 83 mg/g{creat} — ABNORMAL HIGH (ref 0–29)
Microalbumin, Urine: 26.7 ug/mL

## 2024-03-31 LAB — TSH+FREE T4
Free T4: 0.91 ng/dL (ref 0.82–1.77)
TSH: 10.5 u[IU]/mL — ABNORMAL HIGH (ref 0.450–4.500)

## 2024-03-31 NOTE — Telephone Encounter (Signed)
 PAP: Patient assistance application for Farxiga  has been approved by PAP Companies: [employer] from 03/27/2024 to 07/15/2025. Medication should be delivered to PAP Delivery: Home. For further shipping updates, please contact [employer] ([employer]) at [phone]. Patient ID is: not provided

## 2024-04-04 DIAGNOSIS — M7121 Synovial cyst of popliteal space [Baker], right knee: Secondary | ICD-10-CM | POA: Insufficient documentation

## 2024-04-04 NOTE — Assessment & Plan Note (Signed)
 BMI 38. He is encouraged to initially strive for BMI less than 30 to decrease cardiac risk. He is advised to exercise no less than 150 minutes per week.  He was congratulated on his efforts on making necessary dietary changes.

## 2024-04-04 NOTE — Assessment & Plan Note (Addendum)
 Chronic Baker's cyst in the right knee with recurrent flare-ups. Previous cortisone injections ineffective. Symptoms exacerbated by physical activity. Discussed potential knee replacement due to frequent occurrences over the past three years. - Consider use of Voltaren gel for symptom relief. - Can go to Ortho if he desires drainage

## 2024-04-04 NOTE — Assessment & Plan Note (Addendum)
 Chronic, LDL goal is less than 70.  He will continue with rosuvastatin  40mg  daily.  - Diabetic foot exam was performed.  He is intolerant to GLP-1 therapy.  He will continue with Farxiga . - Continue under care of Endo.

## 2024-04-04 NOTE — Assessment & Plan Note (Signed)
 Hypothyroidism with recent thyroid  function tests suggesting potential need for medication adjustment. Currently on 112 mcg of Synthroid  daily. Last check in March indicated levels slightly off, suggesting possible dosage increase. - Recheck thyroid  function tests today. - Adjust Synthroid  dosage based on test results.

## 2024-04-04 NOTE — Assessment & Plan Note (Signed)
 Type 2 diabetes mellitus with GMI of 6.8 and average blood sugar levels around 150 mg/dL, higher in the morning. Using Libre 8.3 for glucose monitoring. Discussed diet's impact on blood sugar spikes and improved glucose control's effect on yeast infections. - Schedule a four-month diabetes check. - Schedule a lab visit in four weeks.

## 2024-04-04 NOTE — Assessment & Plan Note (Signed)
 A full exam was performed.  DRE deferred, he is followed by Urology.  He is advised to get 30-45 minutes of regular exercise, no less than four to five days per week. Both weight-bearing and aerobic exercises are recommended.  He is advised to follow a healthy diet with at least six fruits/veggies per day, decrease intake of red meat and other saturated fats and to increase fish intake to twice weekly.  Meats/fish should not be fried -- baked, boiled or broiled is preferable. It is also important to cut back on your sugar intake.  Be sure to read labels - try to avoid anything with added sugar, high fructose corn syrup or other sweeteners.  If you must use a sweetener, you can try stevia or monkfruit.  It is also important to avoid artificially sweetened foods/beverages and diet drinks. Lastly, wear SPF 50 sunscreen on exposed skin and when in direct sunlight for an extended period of time.  Be sure to avoid fast food restaurants and aim for at least 60 ounces of water daily.    - Ensure receipt of eye examination report. - Confirm prostate check with urologist.

## 2024-04-07 ENCOUNTER — Other Ambulatory Visit: Payer: Self-pay | Admitting: Internal Medicine

## 2024-04-07 DIAGNOSIS — E039 Hypothyroidism, unspecified: Secondary | ICD-10-CM

## 2024-04-08 ENCOUNTER — Other Ambulatory Visit: Payer: Self-pay

## 2024-04-08 DIAGNOSIS — E1165 Type 2 diabetes mellitus with hyperglycemia: Secondary | ICD-10-CM

## 2024-04-08 MED ORDER — DAPAGLIFLOZIN PROPANEDIOL 10 MG PO TABS: 10.0000 mg | ORAL_TABLET | Freq: Every day | ORAL | Status: AC

## 2024-04-22 ENCOUNTER — Other Ambulatory Visit

## 2024-04-22 DIAGNOSIS — E785 Hyperlipidemia, unspecified: Secondary | ICD-10-CM | POA: Diagnosis not present

## 2024-04-22 DIAGNOSIS — E1169 Type 2 diabetes mellitus with other specified complication: Secondary | ICD-10-CM

## 2024-04-22 LAB — HEMOGLOBIN A1C
Est. average glucose Bld gHb Est-mCnc: 166 mg/dL
Hgb A1c MFr Bld: 7.4 % — ABNORMAL HIGH (ref 4.8–5.6)

## 2024-04-28 ENCOUNTER — Other Ambulatory Visit: Payer: Self-pay | Admitting: Internal Medicine

## 2024-05-05 ENCOUNTER — Telehealth: Payer: Self-pay | Admitting: Pharmacist

## 2024-05-05 DIAGNOSIS — E0800 Diabetes mellitus due to underlying condition with hyperosmolarity without nonketotic hyperglycemic-hyperosmolar coma (NKHHC): Secondary | ICD-10-CM

## 2024-05-05 MED ORDER — FREESTYLE LIBRE 3 PLUS SENSOR MISC: 6 refills | Status: AC

## 2024-05-05 NOTE — Progress Notes (Signed)
   05/05/2024  Patient ID: Connor JONELLE Rolfe Mickey., male   DOB: 1951-01-03, 73 y.o.   MRN: [id number]  Patient was called to follow up on medication assistance and blood sugars. HIPAA identifiers were obtained.    Mr. [name] is a 73 year old male with multiple medical conditions including but not limited to:  type 2 diabetes, polyneuropathy, glaucoma, hypertension, hyperlipidemia, allergic rhinitis, sleep apnea, and GERD.  He receives Farxiga  through [employer] and has already been approved for 2026.     Lipid Panel     Component Value Date/Time   CHOL 197 03/25/2024 1557   TRIG 158 (H) 03/25/2024 1557   HDL 40 03/25/2024 1557   CHOLHDL 4.9 03/25/2024 1557   CHOLHDL 5 11/17/2015 1428   VLDL 60.0 (H) 11/17/2015 1428   LDLCALC 129 (H) 03/25/2024 1557   LDLDIRECT 148.0 11/17/2015 1428   LABVLDL 28 03/25/2024 1557    On statin therapy: Rosuvastatin  40 mg lf 03/11/24 #90 On ACE lisinopril  5 mg lf 03/11/24 #90   Connor Becker, PharmD, BCACP Clinical Pharmacist [phone]

## 2024-06-16 ENCOUNTER — Encounter: Payer: Self-pay | Admitting: Internal Medicine

## 2024-06-17 ENCOUNTER — Other Ambulatory Visit: Payer: Self-pay | Admitting: Internal Medicine

## 2024-06-17 MED ORDER — FLUCONAZOLE 150 MG PO TABS: ORAL_TABLET | ORAL | 0 refills | Status: AC

## 2024-07-05 NOTE — Progress Notes (Signed)
 Pharmacy Quality Measure Review  This patient is appearing on a report for being at risk of failing the adherence measure for cholesterol (statin) and hypertension (ACEi/ARB) medications this calendar year.   Medication: rosuvastatin  40 mg Last fill date: 06/26/2024 for 90 day supply  Medication: lisinopril  5 mg Last fill date: 06/26/2024 for 90 day supply  Insurance report was not up to date. No action needed at this time.   Woodie Jock, PharmD PGY1 Pharmacy Resident  07/05/2024

## 2024-07-15 ENCOUNTER — Other Ambulatory Visit: Payer: Self-pay | Admitting: Internal Medicine

## 2024-07-15 DIAGNOSIS — E039 Hypothyroidism, unspecified: Secondary | ICD-10-CM

## 2024-07-30 ENCOUNTER — Ambulatory Visit: Payer: Self-pay | Admitting: Internal Medicine

## 2024-07-30 ENCOUNTER — Encounter: Payer: Self-pay | Admitting: Internal Medicine

## 2024-07-30 VITALS — BP 120/80 | HR 75 | Temp 98.0°F | Ht 74.0 in | Wt 288.2 lb

## 2024-07-30 DIAGNOSIS — E039 Hypothyroidism, unspecified: Secondary | ICD-10-CM

## 2024-07-30 DIAGNOSIS — E1165 Type 2 diabetes mellitus with hyperglycemia: Secondary | ICD-10-CM

## 2024-07-30 DIAGNOSIS — E1169 Type 2 diabetes mellitus with other specified complication: Secondary | ICD-10-CM

## 2024-07-30 DIAGNOSIS — E785 Hyperlipidemia, unspecified: Secondary | ICD-10-CM | POA: Diagnosis not present

## 2024-07-30 DIAGNOSIS — Z23 Encounter for immunization: Secondary | ICD-10-CM

## 2024-07-30 DIAGNOSIS — E291 Testicular hypofunction: Secondary | ICD-10-CM

## 2024-07-30 DIAGNOSIS — J329 Chronic sinusitis, unspecified: Secondary | ICD-10-CM | POA: Diagnosis not present

## 2024-07-30 MED ORDER — PSEUDOEPHEDRINE HCL 30 MG PO TABS: ORAL_TABLET | ORAL | 2 refills | Status: AC

## 2024-07-30 NOTE — Progress Notes (Signed)
 I,Connor Becker, CMA,acting as a neurosurgeon for Connor LOISE Slocumb, MD.,have documented all relevant documentation on the behalf of Connor LOISE Slocumb, MD,as directed by  Connor LOISE Slocumb, MD while in the presence of Connor LOISE Slocumb, MD.  Subjective:  Patient ID: Connor Becker , male    DOB: 17-Dec-1950 , 74 y.o.   MRN: [id number]  Chief Complaint  Patient presents with   Diabetes    Patient presents today for a diabetes & thyroid  check. Patient reports compliance with his medications. He denies having any specific questions or concerns. Denies having any headaches, chest pain & sob.    Hypothyroidism    HPI Discussed the use of AI scribe software for clinical note transcription with the patient, who gave verbal consent to proceed.  History of Present Illness Connor Becker is a 74 year old male with diabetes and thyroid  disorder who presents with fluctuating blood sugar levels.  He experiences fluctuating blood sugar levels, describing them as 'up and down' regardless of dietary intake. He uses a Dexcom device for glucose monitoring and notes low blood sugar alarms, particularly in the mornings, with readings in the 60s. Blood sugar tends to rise between midnight and 3 AM. He is currently taking Farxiga  for diabetes management and previously tried a once-weekly injectable medication but did not tolerate it well. He is not currently seeing an endocrinologist.  He manages a thyroid  disorder and takes levothyroxine  112 mcg daily. He is unsure if he has the 125 mcg tablets for his thyroid  medication. He has been taking his thyroid  medication together with Farxiga , which may affect absorption.  He drinks six to eight bottles of water daily and notes that his gastrointestinal symptoms have been more normal recently. He has a history of intolerance to metformin . He mentions a urinary issue, noting occasional flare-ups and infections, which he associates with high sugar intake.  He uses  Sudafed for congestion, taking it mid-morning and in the evening. He notes that it does not affect his blood pressure. 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   Diabetes He presents for his follow-up diabetic visit. He has type 2 diabetes mellitus. His disease course has been stable. There are no hypoglycemic associated symptoms. Pertinent negatives for diabetes include no polydipsia, no polyphagia and no polyuria. There are no hypoglycemic complications. Risk factors for coronary artery disease include diabetes mellitus, dyslipidemia, male sex, obesity and sedentary lifestyle. He participates in exercise intermittently. An ACE inhibitor/angiotensin II receptor blocker is being taken. Eye exam is not current.  Hyperlipidemia This is a chronic problem. The current episode started more than 1 year ago. The problem is controlled. Exacerbating diseases include diabetes, hypothyroidism and obesity. Pertinent negatives include no shortness of breath. Current antihyperlipidemic treatment includes statins. The current treatment provides moderate improvement of lipids.     Past Medical History:  Diagnosis Date   ALLERGIC RHINITIS    Chronic pain of both knees    DJD (degenerative joint disease) of knee    right   DM2 (diabetes mellitus, type 2) (HCC)    GERD    Glaucoma    History of nuclear stress test    Myoview  12/17: EF 53, no ischemia, Low Risk   HYPERLIPIDEMIA    myalgias from Lipitor   HYPOGONADISM, MALE    Hypothyroidism  INSOMNIA-SLEEP DISORDER-UNSPEC    Joint pain    Nausea, vomiting and diarrhea 11/29/2023   Obesity    OSTEOARTHRITIS    SLEEP APNEA, OBSTRUCTIVE    no CPAP; repeat sleep test pending 07/2016   Unspecified hypothyroidism 12/04/2013   Vitamin D  deficiency      Family History  Problem Relation Age of Onset   Heart attack  Mother 77       s/p MI   Other Mother        pacemaker   Diabetes Mother    High blood pressure Mother    High Cholesterol Mother    Thyroid  disease Mother    Obesity Mother    Macular degeneration Father    Thyroid  disease Father    Diabetes Maternal Uncle    Heart attack Maternal Uncle    Hypertension Other    Prostate cancer Other    Heart disease Other    Colon cancer Neg Hx      Current Outpatient Medications:    albuterol  (VENTOLIN  HFA) 108 (90 Base) MCG/ACT inhaler, Inhale 2 puffs into the lungs every 6 (six) hours as needed for wheezing or shortness of breath., Disp: 8 g, Rfl: 2   Continuous Glucose Sensor (FREESTYLE LIBRE 3 PLUS SENSOR) MISC, PLACE 1 SENSOR EVERY 15 DAYS - USE TO CHECK GLUCOSE CONTINUOUSLY, Disp: 2 each, Rfl: 6   dapagliflozin  propanediol (FARXIGA ) 10 MG TABS tablet, Take 1 tablet (10 mg total) by mouth daily before breakfast., Disp: , Rfl:    diphenhydrAMINE  (BENADRYL  ALLERGY) 25 MG tablet, Take 25 mg by mouth every 4 (four) hours as needed for allergies., Disp: , Rfl:    EPINEPHrine  0.3 mg/0.3 mL IJ SOAJ injection, Inject 0.3 mg into the muscle as needed for anaphylaxis., Disp: 1 each, Rfl: 0   ezetimibe  (ZETIA ) 10 MG tablet, Take 1 tablet (10 mg total) by mouth daily., Disp: 90 tablet, Rfl: 2   fluconazole  (DIFLUCAN ) 150 MG tablet, Take one tablet by mouth today and repeat in 48 hours, Disp: 2 tablet, Rfl: 0   fluticasone  (FLONASE ) 50 MCG/ACT nasal spray, Place 2 sprays into both nostrils daily., Disp: 16 g, Rfl: 0   ibuprofen (ADVIL) 200 MG tablet, Take 400-600 mg by mouth every 6 (six) hours as needed for mild pain (pain score 1-3) or moderate pain (pain score 4-6)., Disp: , Rfl:    latanoprost (XALATAN) 0.005 % ophthalmic solution, Place 1 drop into both eyes daily., Disp: , Rfl:    levothyroxine  (SYNTHROID ) 112 MCG tablet, Take 1 tablet by mouth once daily, Disp: 90 tablet, Rfl: 0   lisinopril  (ZESTRIL ) 5 MG tablet, Take 1 tablet (5 mg total) by mouth  daily., Disp: 90 tablet, Rfl: 2   rosuvastatin  (CRESTOR ) 40 MG tablet, Take 1 tablet (40 mg total) by mouth daily., Disp: 90 tablet, Rfl: 3   tadalafil  (CIALIS ) 5 MG tablet, Take 5 mg by mouth daily., Disp: , Rfl: 11   Testosterone  1.62 % GEL, Apply 2 pumps daily, Disp: , Rfl:    timolol  (TIMOPTIC ) 0.5 % ophthalmic solution, Place 1 drop into both eyes at bedtime., Disp: , Rfl: 2   UNABLE TO FIND, Inject 1 Dose into the skin See admin instructions. Med Name: Testosterone  Pellets  Insert pellets every 4 months - unknown dose, Disp: , Rfl:    pseudoephedrine  (SUDAFED) 30 MG tablet, One tab po twice daily as needed, Disp: 60 tablet, Rfl: 2   Allergies  Allergen Reactions   Horseradish [Armoracia Rusticana Ext (  Horseradish)] Anaphylaxis   Other Anaphylaxis    Soy sauce only    Lipitor [Atorvastatin ] Other (See Comments)    Leg cramps   Metformin  And Related Other (See Comments)    GI upset   Mounjaro  [Tirzepatide ] Other (See Comments)    Ileus     Review of Systems  Constitutional: Negative.   Respiratory: Negative.  Negative for shortness of breath.   Cardiovascular: Negative.   Gastrointestinal: Negative.   Endocrine: Negative for polydipsia, polyphagia and polyuria.  Skin: Negative.   Allergic/Immunologic: Negative.   Hematological: Negative.      Today's Vitals   07/30/24 1142  BP: 120/80  Pulse: 75  Temp: 98 F (36.7 C)  SpO2: 98%  Weight: 288 lb 3.2 oz (130.7 kg)  Height: 6' 2 (1.88 m)   Body mass index is 37 kg/m.  Wt Readings from Last 3 Encounters:  07/30/24 288 lb 3.2 oz (130.7 kg)  03/25/24 296 lb 6.4 oz (134.4 kg)  02/12/24 292 lb (132.5 kg)    The 10-year ASCVD risk score (Arnett DK, et al., 2019) is: 46.1%   Values used to calculate the score:     Age: 32 years     Clinically relevant sex: Male     Is Non-Hispanic African American: No     Diabetic: Yes     Tobacco smoker: No     Systolic Blood Pressure: 120 mmHg     Is BP treated: Yes     HDL  Cholesterol: 40 mg/dL     Total Cholesterol: 197 mg/dL  Objective:  Physical Exam Vitals and nursing note reviewed.  Constitutional:      Appearance: Normal appearance. He is obese.  HENT:     Head: Normocephalic and atraumatic.  Eyes:     Extraocular Movements: Extraocular movements intact.  Cardiovascular:     Rate and Rhythm: Normal rate and regular rhythm.     Heart sounds: Normal heart sounds.  Pulmonary:     Effort: Pulmonary effort is normal.     Breath sounds: Normal breath sounds.  Musculoskeletal:     Cervical back: Normal range of motion.  Skin:    General: Skin is warm.  Neurological:     General: No focal deficit present.     Mental Status: He is alert.  Psychiatric:        Mood and Affect: Mood normal.         Assessment And Plan:   Assessment & Plan Type 2 diabetes mellitus with hyperglycemia, without long-term current use of insulin  (HCC) Blood glucose fluctuates with postprandial spikes. Current regimen includes Farxiga . No recent endocrinology follow-up. Dexcom shows 90% time in range, no hypoglycemia. Discussed mealtime insulin  for postprandial control. He is hesitant but open to considering insulin . - Consider mealtime insulin  before evening meal. - Continue Farxiga . - Advised to decrease sugar intake. - Monitor blood glucose with Dexcom.  Hyperlipidemia due to type 2 diabetes mellitus (HCC) Chronic, LDL goal is less than 70.  He will continue with rosuvastatin  40mg  daily.  - Intolerant of GLP-1 therapy Primary hypothyroidism TSH high, indicating suboptimal control. Current regimen is 112 mcg daily, taken with Farxiga , affecting absorption. No adjustment to 125 mcg on weekdays as recommended. - Adjust thyroid  medication to 125 mcg Monday-Friday, 112 mcg on weekends. - Take thyroid  medication separately from other medications, with at least 30 minutes gap. Chronic congestion of paranasal sinus Managed with Sudafed, effective but not ideal due to  side effects, especially in patients  over 65. Pharmacy policy change requires prescription for Sudafed. - Continue Sudafed as needed, aware of potential side effects. Hypogonadism in male Chronic, he is currently prescribed supplemental testosterone  by Urology. I will check labs as below and forward results to Dr. Nieves for his review. I defer dose adjustments to Urology.  Morbid obesity due to excess calories (HCC) BMI 37 with underlying HTN, DM and hyperlipidemia.  - Encouraged to strive for BMI < 30 to decrease cardiac risk.     Orders Placed This Encounter  Procedures   CMP14+EGFR   Hemoglobin A1c   TSH   Testosterone ,Free and Total     Return for 4 MONTH DM F/U.SABRA  Patient was given opportunity to ask questions. Patient verbalized understanding of the plan and was able to repeat key elements of the plan. All questions were answered to their satisfaction.   I, Connor LOISE Slocumb, MD, have reviewed all documentation for this visit. The documentation on 07/30/24 for the exam, diagnosis, procedures, and orders are all accurate and complete.   IF YOU HAVE BEEN REFERRED TO A SPECIALIST, IT MAY TAKE 1-2 WEEKS TO SCHEDULE/PROCESS THE REFERRAL. IF YOU HAVE NOT HEARD FROM US /SPECIALIST IN TWO WEEKS, PLEASE GIVE US  A CALL AT [phone].

## 2024-07-30 NOTE — Patient Instructions (Signed)

## 2024-08-02 ENCOUNTER — Ambulatory Visit: Payer: Self-pay | Admitting: Internal Medicine

## 2024-08-02 LAB — CMP14+EGFR
ALT: 18 IU/L (ref 0–44)
AST: 20 IU/L (ref 0–40)
Albumin: 4.3 g/dL (ref 3.8–4.8)
Alkaline Phosphatase: 71 IU/L (ref 47–123)
BUN/Creatinine Ratio: 22 (ref 10–24)
BUN: 21 mg/dL (ref 8–27)
Bilirubin Total: 0.6 mg/dL (ref 0.0–1.2)
CO2: 21 mmol/L (ref 20–29)
Calcium: 9.4 mg/dL (ref 8.6–10.2)
Chloride: 102 mmol/L (ref 96–106)
Creatinine, Ser: 0.97 mg/dL (ref 0.76–1.27)
Globulin, Total: 2.5 g/dL (ref 1.5–4.5)
Glucose: 111 mg/dL — ABNORMAL HIGH (ref 70–99)
Potassium: 4.2 mmol/L (ref 3.5–5.2)
Sodium: 139 mmol/L (ref 134–144)
Total Protein: 6.8 g/dL (ref 6.0–8.5)
eGFR: 81 mL/min/1.73

## 2024-08-02 LAB — HEMOGLOBIN A1C
Est. average glucose Bld gHb Est-mCnc: 171 mg/dL
Hgb A1c MFr Bld: 7.6 % — ABNORMAL HIGH (ref 4.8–5.6)

## 2024-08-02 LAB — TSH: TSH: 7.44 u[IU]/mL — ABNORMAL HIGH (ref 0.450–4.500)

## 2024-08-02 LAB — TESTOSTERONE,FREE AND TOTAL
Testosterone, Free: 3.7 pg/mL — ABNORMAL LOW (ref 6.6–18.1)
Testosterone: 341 ng/dL (ref 264–916)

## 2024-08-02 NOTE — Assessment & Plan Note (Signed)
 Blood glucose fluctuates with postprandial spikes. Current regimen includes Farxiga . No recent endocrinology follow-up. Dexcom shows 90% time in range, no hypoglycemia. Discussed mealtime insulin  for postprandial control. He is hesitant but open to considering insulin . - Consider mealtime insulin  before evening meal. - Continue Farxiga . - Advised to decrease sugar intake. - Monitor blood glucose with Dexcom.

## 2024-08-02 NOTE — Assessment & Plan Note (Signed)
 TSH high, indicating suboptimal control. Current regimen is 112 mcg daily, taken with Farxiga , affecting absorption. No adjustment to 125 mcg on weekdays as recommended. - Adjust thyroid  medication to 125 mcg Monday-Friday, 112 mcg on weekends. - Take thyroid  medication separately from other medications, with at least 30 minutes gap.

## 2024-08-02 NOTE — Assessment & Plan Note (Signed)
Chronic, he is currently prescribed supplemental testosterone by Urology. I will check labs as below and forward results to Dr. Mena Goes for his review. I defer dose adjustments to Urology.

## 2024-08-02 NOTE — Assessment & Plan Note (Signed)
 Chronic, LDL goal is less than 70.  He will continue with rosuvastatin  40mg  daily.  - Intolerant of GLP-1 therapy

## 2024-11-11 ENCOUNTER — Ambulatory Visit: Payer: Self-pay

## 2024-11-30 ENCOUNTER — Ambulatory Visit: Payer: Self-pay | Admitting: Internal Medicine

## 2025-04-06 ENCOUNTER — Encounter: Payer: Self-pay | Admitting: Internal Medicine
